# Patient Record
Sex: Female | Born: 1945 | Race: White | Hispanic: No | Marital: Single | Smoking: Never smoker
Health system: Southern US, Community
[De-identification: ages and names within clinical notes are randomized; demographics above are authoritative.]

## PROBLEM LIST (undated history)

## (undated) DIAGNOSIS — D496 Neoplasm of unspecified behavior of brain: Secondary | ICD-10-CM

## (undated) DIAGNOSIS — C719 Malignant neoplasm of brain, unspecified: Secondary | ICD-10-CM

## (undated) DIAGNOSIS — F419 Anxiety disorder, unspecified: Secondary | ICD-10-CM

## (undated) DIAGNOSIS — R011 Cardiac murmur, unspecified: Secondary | ICD-10-CM

## (undated) DIAGNOSIS — N200 Calculus of kidney: Secondary | ICD-10-CM

## (undated) DIAGNOSIS — R569 Unspecified convulsions: Secondary | ICD-10-CM

## (undated) DIAGNOSIS — R55 Syncope and collapse: Secondary | ICD-10-CM

## (undated) DIAGNOSIS — C50919 Malignant neoplasm of unspecified site of unspecified female breast: Secondary | ICD-10-CM

## (undated) DIAGNOSIS — M25562 Pain in left knee: Secondary | ICD-10-CM

## (undated) DIAGNOSIS — C50412 Malignant neoplasm of upper-outer quadrant of left female breast: Secondary | ICD-10-CM

## (undated) DIAGNOSIS — T451X5A Adverse effect of antineoplastic and immunosuppressive drugs, initial encounter: Secondary | ICD-10-CM

## (undated) DIAGNOSIS — E669 Obesity, unspecified: Secondary | ICD-10-CM

## (undated) DIAGNOSIS — D649 Anemia, unspecified: Secondary | ICD-10-CM

## (undated) DIAGNOSIS — E538 Deficiency of other specified B group vitamins: Secondary | ICD-10-CM

## (undated) DIAGNOSIS — K521 Toxic gastroenteritis and colitis: Secondary | ICD-10-CM

## (undated) DIAGNOSIS — Z9289 Personal history of other medical treatment: Secondary | ICD-10-CM

## (undated) DIAGNOSIS — K219 Gastro-esophageal reflux disease without esophagitis: Secondary | ICD-10-CM

## (undated) DIAGNOSIS — C50912 Malignant neoplasm of unspecified site of left female breast: Secondary | ICD-10-CM

## (undated) DIAGNOSIS — C7931 Secondary malignant neoplasm of brain: Secondary | ICD-10-CM

## (undated) DIAGNOSIS — G40909 Epilepsy, unspecified, not intractable, without status epilepticus: Secondary | ICD-10-CM

## (undated) DIAGNOSIS — D6481 Anemia due to antineoplastic chemotherapy: Secondary | ICD-10-CM

## (undated) DIAGNOSIS — M6282 Rhabdomyolysis: Secondary | ICD-10-CM

## (undated) DIAGNOSIS — E559 Vitamin D deficiency, unspecified: Secondary | ICD-10-CM

## (undated) DIAGNOSIS — N179 Acute kidney failure, unspecified: Secondary | ICD-10-CM

## (undated) DIAGNOSIS — N189 Chronic kidney disease, unspecified: Secondary | ICD-10-CM

## (undated) DIAGNOSIS — K1231 Oral mucositis (ulcerative) due to antineoplastic therapy: Secondary | ICD-10-CM

## (undated) DIAGNOSIS — M5136 Other intervertebral disc degeneration, lumbar region: Secondary | ICD-10-CM

## (undated) DIAGNOSIS — T7840XA Allergy, unspecified, initial encounter: Secondary | ICD-10-CM

## (undated) DIAGNOSIS — M51369 Other intervertebral disc degeneration, lumbar region without mention of lumbar back pain or lower extremity pain: Secondary | ICD-10-CM

## (undated) DIAGNOSIS — I10 Essential (primary) hypertension: Secondary | ICD-10-CM

## (undated) HISTORY — DX: Neoplasm of unspecified behavior of brain: D49.6

## (undated) HISTORY — DX: Secondary malignant neoplasm of brain: C79.31

## (undated) HISTORY — DX: Syncope and collapse: R55

## (undated) HISTORY — DX: Anemia due to antineoplastic chemotherapy: D64.81

## (undated) HISTORY — DX: Gastro-esophageal reflux disease without esophagitis: K21.9

## (undated) HISTORY — DX: Malignant neoplasm of unspecified site of unspecified female breast: C50.919

## (undated) HISTORY — DX: Malignant neoplasm of brain, unspecified: C71.9

## (undated) HISTORY — DX: Allergy, unspecified, initial encounter: T78.40XA

## (undated) HISTORY — DX: Adverse effect of antineoplastic and immunosuppressive drugs, initial encounter: T45.1X5A

## (undated) HISTORY — DX: Unspecified convulsions: R56.9

## (undated) HISTORY — DX: Deficiency of other specified B group vitamins: E53.8

## (undated) HISTORY — PX: ESOPHAGEAL DILATION: SHX303

## (undated) HISTORY — DX: Oral mucositis (ulcerative) due to antineoplastic therapy: K12.31

## (undated) HISTORY — PX: CHOLECYSTECTOMY OPEN: SUR202

## (undated) HISTORY — DX: Hypomagnesemia: E83.42

## (undated) HISTORY — DX: Vitamin D deficiency, unspecified: E55.9

## (undated) HISTORY — DX: Malignant neoplasm of upper-outer quadrant of left female breast: C50.412

## (undated) HISTORY — DX: Acute kidney failure, unspecified: N17.9

## (undated) HISTORY — DX: Obesity, unspecified: E66.9

## (undated) HISTORY — DX: Chronic kidney disease, unspecified: N18.9

## (undated) HISTORY — DX: Essential (primary) hypertension: I10

## (undated) HISTORY — DX: Anxiety disorder, unspecified: F41.9

## (undated) HISTORY — DX: Toxic gastroenteritis and colitis: K52.1

## (undated) HISTORY — DX: Epilepsy, unspecified, not intractable, without status epilepticus: G40.909

## (undated) HISTORY — DX: Malignant neoplasm of unspecified site of left female breast: C50.912

## (undated) HISTORY — DX: Rhabdomyolysis: M62.82

## (undated) HISTORY — DX: Pain in left knee: M25.562

## (undated) HISTORY — PX: BREAST BIOPSY: SHX20

---

## 1998-09-02 ENCOUNTER — Other Ambulatory Visit: Admission: RE | Admit: 1998-09-02 | Discharge: 1998-09-02 | Payer: Self-pay | Admitting: Gynecology

## 1998-10-01 ENCOUNTER — Other Ambulatory Visit: Admission: RE | Admit: 1998-10-01 | Discharge: 1998-10-01 | Payer: Self-pay

## 2001-09-12 ENCOUNTER — Other Ambulatory Visit: Admission: RE | Admit: 2001-09-12 | Discharge: 2001-09-12 | Payer: Self-pay | Admitting: Obstetrics and Gynecology

## 2001-12-26 ENCOUNTER — Emergency Department (HOSPITAL_COMMUNITY): Admission: EM | Admit: 2001-12-26 | Discharge: 2001-12-26 | Payer: Self-pay | Admitting: Emergency Medicine

## 2001-12-30 ENCOUNTER — Encounter: Payer: Self-pay | Admitting: Internal Medicine

## 2001-12-30 ENCOUNTER — Encounter: Admission: RE | Admit: 2001-12-30 | Discharge: 2001-12-30 | Payer: Self-pay | Admitting: Internal Medicine

## 2002-09-20 ENCOUNTER — Encounter: Admission: RE | Admit: 2002-09-20 | Discharge: 2002-12-19 | Payer: Self-pay | Admitting: Internal Medicine

## 2003-01-09 ENCOUNTER — Encounter: Admission: RE | Admit: 2003-01-09 | Discharge: 2003-04-09 | Payer: Self-pay | Admitting: General Practice

## 2003-12-26 ENCOUNTER — Ambulatory Visit (HOSPITAL_COMMUNITY): Admission: RE | Admit: 2003-12-26 | Discharge: 2003-12-26 | Payer: Self-pay | Admitting: Gastroenterology

## 2004-08-18 ENCOUNTER — Ambulatory Visit: Payer: Self-pay | Admitting: Internal Medicine

## 2005-03-23 ENCOUNTER — Ambulatory Visit: Payer: Self-pay | Admitting: Internal Medicine

## 2006-07-15 ENCOUNTER — Ambulatory Visit: Payer: Self-pay | Admitting: Internal Medicine

## 2006-07-15 LAB — CONVERTED CEMR LAB
ALT: 21 units/L (ref 0–40)
AST: 25 units/L (ref 0–37)
Albumin: 3.7 g/dL (ref 3.5–5.2)
Alkaline Phosphatase: 83 units/L (ref 39–117)
BUN: 14 mg/dL (ref 6–23)
Basophils Absolute: 0.8 10*3/uL — ABNORMAL HIGH (ref 0.0–0.1)
Basophils Relative: 10.8 % — ABNORMAL HIGH (ref 0.0–1.0)
Bilirubin Urine: NEGATIVE
CO2: 26 meq/L (ref 19–32)
Calcium: 9.2 mg/dL (ref 8.4–10.5)
Chloride: 105 meq/L (ref 96–112)
Chol/HDL Ratio, serum: 4.2
Cholesterol: 149 mg/dL (ref 0–200)
Creatinine, Ser: 1 mg/dL (ref 0.4–1.2)
Crystals: NEGATIVE
Eosinophil percent: 1.3 % (ref 0.0–5.0)
GFR calc non Af Amer: 60 mL/min
Glomerular Filtration Rate, Af Am: 72 mL/min/{1.73_m2}
Glucose, Bld: 112 mg/dL — ABNORMAL HIGH (ref 70–99)
HCT: 40.5 % (ref 36.0–46.0)
HDL: 35.4 mg/dL — ABNORMAL LOW (ref >39.0)
Hemoglobin, Urine: NEGATIVE
Hemoglobin: 13.9 g/dL (ref 12.0–15.0)
Ketones, ur: NEGATIVE mg/dL
LDL DIRECT: 80.9 mg/dL
Lymphocytes Relative: 45.4 % (ref 12.0–46.0)
MCHC: 34.2 g/dL (ref 30.0–36.0)
MCV: 91.6 fL (ref 78.0–100.0)
Monocytes Absolute: 0.4 10*3/uL (ref 0.2–0.7)
Monocytes Relative: 5 % (ref 3.0–11.0)
Neutro Abs: 2.9 10*3/uL (ref 1.4–7.7)
Neutrophils Relative %: 37.5 % — ABNORMAL LOW (ref 43.0–77.0)
Nitrite: POSITIVE — AB
Platelets: 261 10*3/uL (ref 150–400)
Potassium: 3.3 meq/L — ABNORMAL LOW (ref 3.5–5.1)
RBC: 4.42 M/uL (ref 3.87–5.11)
RDW: 12.2 % (ref 11.5–14.6)
Sodium: 142 meq/L (ref 135–145)
Specific Gravity, Urine: 1.025 (ref 1.000–1.03)
TSH: 1.41 microintl units/mL (ref 0.35–5.50)
Total Bilirubin: 0.7 mg/dL (ref 0.3–1.2)
Total Protein, Urine: NEGATIVE mg/dL
Total Protein: 7.1 g/dL (ref 6.0–8.3)
Triglyceride fasting, serum: 212 mg/dL (ref 0–149)
Urine Glucose: NEGATIVE mg/dL
Urobilinogen, UA: 0.2 (ref 0.0–1.0)
VLDL: 42 mg/dL — ABNORMAL HIGH (ref 0–40)
WBC: 7.7 10*3/uL (ref 4.5–10.5)
pH: 6 (ref 5.0–8.0)

## 2006-07-22 ENCOUNTER — Ambulatory Visit: Payer: Self-pay | Admitting: Internal Medicine

## 2007-05-09 ENCOUNTER — Encounter: Payer: Self-pay | Admitting: Internal Medicine

## 2007-05-09 DIAGNOSIS — K219 Gastro-esophageal reflux disease without esophagitis: Secondary | ICD-10-CM

## 2007-05-09 DIAGNOSIS — J309 Allergic rhinitis, unspecified: Secondary | ICD-10-CM | POA: Insufficient documentation

## 2007-05-09 HISTORY — DX: Gastro-esophageal reflux disease without esophagitis: K21.9

## 2008-03-20 ENCOUNTER — Telehealth: Payer: Self-pay | Admitting: Internal Medicine

## 2008-04-20 ENCOUNTER — Ambulatory Visit: Payer: Self-pay | Admitting: Internal Medicine

## 2008-04-20 LAB — CONVERTED CEMR LAB
ALT: 30 units/L (ref 0–35)
AST: 27 units/L (ref 0–37)
Albumin: 4 g/dL (ref 3.5–5.2)
Alkaline Phosphatase: 72 units/L (ref 39–117)
BUN: 12 mg/dL (ref 6–23)
Basophils Absolute: 0 10*3/uL (ref 0.0–0.1)
Basophils Relative: 0.8 % (ref 0.0–3.0)
Bilirubin Urine: NEGATIVE
Bilirubin, Direct: 0.1 mg/dL (ref 0.0–0.3)
CO2: 28 meq/L (ref 19–32)
Calcium: 9.1 mg/dL (ref 8.4–10.5)
Chloride: 101 meq/L (ref 96–112)
Cholesterol: 154 mg/dL (ref 0–200)
Creatinine, Ser: 0.7 mg/dL (ref 0.4–1.2)
Direct LDL: 68.2 mg/dL
Eosinophils Absolute: 0.1 10*3/uL (ref 0.0–0.7)
Eosinophils Relative: 1.5 % (ref 0.0–5.0)
GFR calc Af Amer: 109 mL/min
GFR calc non Af Amer: 90 mL/min
Glucose, Bld: 125 mg/dL — ABNORMAL HIGH (ref 70–99)
HCT: 39 % (ref 36.0–46.0)
HDL: 35.6 mg/dL — ABNORMAL LOW (ref >39.0)
Hemoglobin, Urine: NEGATIVE
Hemoglobin: 13.5 g/dL (ref 12.0–15.0)
Ketones, ur: NEGATIVE mg/dL
Leukocytes, UA: NEGATIVE
Lymphocytes Relative: 36.2 % (ref 12.0–46.0)
MCHC: 34.7 g/dL (ref 30.0–36.0)
MCV: 92.2 fL (ref 78.0–100.0)
Monocytes Absolute: 0.4 10*3/uL (ref 0.1–1.0)
Monocytes Relative: 7.3 % (ref 3.0–12.0)
Neutro Abs: 3.4 10*3/uL (ref 1.4–7.7)
Neutrophils Relative %: 54.2 % (ref 43.0–77.0)
Nitrite: NEGATIVE
Platelets: 272 10*3/uL (ref 150–400)
Potassium: 3.2 meq/L — ABNORMAL LOW (ref 3.5–5.1)
RBC: 4.23 M/uL (ref 3.87–5.11)
RDW: 12.5 % (ref 11.5–14.6)
Sodium: 140 meq/L (ref 135–145)
Specific Gravity, Urine: 1.02 (ref 1.000–1.03)
TSH: 0.85 microintl units/mL (ref 0.35–5.50)
Total Bilirubin: 0.7 mg/dL (ref 0.3–1.2)
Total CHOL/HDL Ratio: 4.3
Total Protein, Urine: NEGATIVE mg/dL
Total Protein: 7.3 g/dL (ref 6.0–8.3)
Triglycerides: 230 mg/dL (ref 0–149)
Urine Glucose: NEGATIVE mg/dL
Urobilinogen, UA: 0.2 (ref 0.0–1.0)
VLDL: 46 mg/dL — ABNORMAL HIGH (ref 0–40)
WBC: 6.1 10*3/uL (ref 4.5–10.5)
pH: 5.5 (ref 5.0–8.0)

## 2008-04-23 ENCOUNTER — Ambulatory Visit: Payer: Self-pay | Admitting: Internal Medicine

## 2008-10-22 ENCOUNTER — Ambulatory Visit: Payer: Self-pay | Admitting: Internal Medicine

## 2008-10-22 DIAGNOSIS — E538 Deficiency of other specified B group vitamins: Secondary | ICD-10-CM | POA: Insufficient documentation

## 2008-10-22 HISTORY — DX: Deficiency of other specified B group vitamins: E53.8

## 2008-10-24 LAB — CONVERTED CEMR LAB
BUN: 12 mg/dL (ref 6–23)
Basophils Absolute: 0.1 10*3/uL (ref 0.0–0.1)
Basophils Relative: 1.2 % (ref 0.0–3.0)
Bilirubin Urine: NEGATIVE
CO2: 29 meq/L (ref 19–32)
Calcium: 9.1 mg/dL (ref 8.4–10.5)
Chloride: 105 meq/L (ref 96–112)
Creatinine, Ser: 0.8 mg/dL (ref 0.4–1.2)
Eosinophils Absolute: 0.1 10*3/uL (ref 0.0–0.7)
Eosinophils Relative: 1 % (ref 0.0–5.0)
GFR calc Af Amer: 93 mL/min
GFR calc non Af Amer: 77 mL/min
Glucose, Bld: 125 mg/dL — ABNORMAL HIGH (ref 70–99)
HCT: 39 % (ref 36.0–46.0)
Hemoglobin, Urine: NEGATIVE
Hemoglobin: 13.7 g/dL (ref 12.0–15.0)
Ketones, ur: NEGATIVE mg/dL
Leukocytes, UA: NEGATIVE
Lymphocytes Relative: 28 % (ref 12.0–46.0)
MCHC: 35 g/dL (ref 30.0–36.0)
MCV: 90.5 fL (ref 78.0–100.0)
Monocytes Absolute: 0.3 10*3/uL (ref 0.1–1.0)
Monocytes Relative: 5.1 % (ref 3.0–12.0)
Neutro Abs: 3.8 10*3/uL (ref 1.4–7.7)
Neutrophils Relative %: 64.7 % (ref 43.0–77.0)
Nitrite: NEGATIVE
Platelets: 251 10*3/uL (ref 150–400)
Potassium: 3.6 meq/L (ref 3.5–5.1)
RBC: 4.31 M/uL (ref 3.87–5.11)
RDW: 12.5 % (ref 11.5–14.6)
Sodium: 146 meq/L — ABNORMAL HIGH (ref 135–145)
Specific Gravity, Urine: 1.02 (ref 1.000–1.035)
TSH: 0.98 microintl units/mL (ref 0.35–5.50)
Total Protein, Urine: NEGATIVE mg/dL
Urine Glucose: NEGATIVE mg/dL
Urobilinogen, UA: 0.2 (ref 0.0–1.0)
Vitamin B-12: 536 pg/mL (ref 211–911)
WBC: 6 10*3/uL (ref 4.5–10.5)
pH: 6 (ref 5.0–8.0)

## 2008-10-30 LAB — CONVERTED CEMR LAB: Vit D, 25-Hydroxy: 20 ng/mL — ABNORMAL LOW (ref 30–89)

## 2009-02-08 ENCOUNTER — Telehealth: Payer: Self-pay | Admitting: Internal Medicine

## 2009-04-24 ENCOUNTER — Ambulatory Visit: Payer: Self-pay | Admitting: Internal Medicine

## 2009-04-24 DIAGNOSIS — R7309 Other abnormal glucose: Secondary | ICD-10-CM | POA: Insufficient documentation

## 2009-04-24 DIAGNOSIS — E559 Vitamin D deficiency, unspecified: Secondary | ICD-10-CM

## 2009-04-24 HISTORY — DX: Vitamin D deficiency, unspecified: E55.9

## 2009-04-29 LAB — CONVERTED CEMR LAB
ALT: 33 units/L (ref 0–35)
AST: 30 units/L (ref 0–37)
Albumin: 4 g/dL (ref 3.5–5.2)
Alkaline Phosphatase: 89 units/L (ref 39–117)
BUN: 15 mg/dL (ref 6–23)
Bilirubin Urine: NEGATIVE
Bilirubin, Direct: 0.1 mg/dL (ref 0.0–0.3)
CO2: 27 meq/L (ref 19–32)
Calcium: 9.4 mg/dL (ref 8.4–10.5)
Chloride: 104 meq/L (ref 96–112)
Cholesterol: 145 mg/dL (ref 0–200)
Creatinine, Ser: 0.8 mg/dL (ref 0.4–1.2)
GFR calc non Af Amer: 76.7 mL/min (ref >60)
Glucose, Bld: 124 mg/dL — ABNORMAL HIGH (ref 70–99)
HDL: 38.1 mg/dL — ABNORMAL LOW (ref >39.00)
Hemoglobin, Urine: NEGATIVE
Hgb A1c MFr Bld: 6.2 % (ref 4.6–6.5)
Ketones, ur: NEGATIVE mg/dL
LDL Cholesterol: 77 mg/dL (ref 0–99)
Leukocytes, UA: NEGATIVE
Nitrite: NEGATIVE
Potassium: 3.7 meq/L (ref 3.5–5.1)
Sodium: 141 meq/L (ref 135–145)
Specific Gravity, Urine: 1.025 (ref 1.000–1.030)
TSH: 1.03 microintl units/mL (ref 0.35–5.50)
Total Bilirubin: 0.8 mg/dL (ref 0.3–1.2)
Total CHOL/HDL Ratio: 4
Total Protein, Urine: NEGATIVE mg/dL
Total Protein: 7.9 g/dL (ref 6.0–8.3)
Triglycerides: 149 mg/dL (ref 0.0–149.0)
Urine Glucose: NEGATIVE mg/dL
Urobilinogen, UA: 0.2 (ref 0.0–1.0)
VLDL: 29.8 mg/dL (ref 0.0–40.0)
pH: 5.5 (ref 5.0–8.0)

## 2009-09-04 ENCOUNTER — Telehealth: Payer: Self-pay | Admitting: Internal Medicine

## 2009-10-22 ENCOUNTER — Ambulatory Visit: Payer: Self-pay | Admitting: Internal Medicine

## 2009-10-22 LAB — CONVERTED CEMR LAB
BUN: 19 mg/dL (ref 6–23)
Basophils Absolute: 0 10*3/uL (ref 0.0–0.1)
Basophils Relative: 0 % (ref 0.0–3.0)
CO2: 27 meq/L (ref 19–32)
Calcium: 9.2 mg/dL (ref 8.4–10.5)
Chloride: 104 meq/L (ref 96–112)
Creatinine, Ser: 0.7 mg/dL (ref 0.4–1.2)
Eosinophils Absolute: 0.1 10*3/uL (ref 0.0–0.7)
Eosinophils Relative: 1.6 % (ref 0.0–5.0)
GFR calc non Af Amer: 89.34 mL/min (ref >60)
Glucose, Bld: 121 mg/dL — ABNORMAL HIGH (ref 70–99)
HCT: 40 % (ref 36.0–46.0)
Hemoglobin: 13.3 g/dL (ref 12.0–15.0)
Hgb A1c MFr Bld: 6.2 % (ref 4.6–6.5)
Lymphocytes Relative: 43.5 % (ref 12.0–46.0)
Lymphs Abs: 3.4 10*3/uL (ref 0.7–4.0)
MCHC: 33.3 g/dL (ref 30.0–36.0)
MCV: 93.5 fL (ref 78.0–100.0)
Monocytes Absolute: 0.6 10*3/uL (ref 0.1–1.0)
Monocytes Relative: 8.3 % (ref 3.0–12.0)
Neutro Abs: 3.7 10*3/uL (ref 1.4–7.7)
Neutrophils Relative %: 46.6 % (ref 43.0–77.0)
Platelets: 213 10*3/uL (ref 150.0–400.0)
Potassium: 3.5 meq/L (ref 3.5–5.1)
RBC: 4.28 M/uL (ref 3.87–5.11)
RDW: 13.1 % (ref 11.5–14.6)
Sodium: 144 meq/L (ref 135–145)
WBC: 7.8 10*3/uL (ref 4.5–10.5)

## 2009-10-28 ENCOUNTER — Ambulatory Visit: Payer: Self-pay | Admitting: Internal Medicine

## 2009-10-28 DIAGNOSIS — F419 Anxiety disorder, unspecified: Secondary | ICD-10-CM

## 2010-04-09 ENCOUNTER — Telehealth: Payer: Self-pay | Admitting: Internal Medicine

## 2010-04-11 ENCOUNTER — Telehealth: Payer: Self-pay | Admitting: Internal Medicine

## 2010-04-30 ENCOUNTER — Ambulatory Visit: Payer: Self-pay | Admitting: Internal Medicine

## 2010-04-30 ENCOUNTER — Encounter: Payer: Self-pay | Admitting: Internal Medicine

## 2010-04-30 DIAGNOSIS — J069 Acute upper respiratory infection, unspecified: Secondary | ICD-10-CM | POA: Insufficient documentation

## 2010-05-01 LAB — CONVERTED CEMR LAB
ALT: 32 units/L (ref 0–35)
AST: 31 units/L (ref 0–37)
Albumin: 4 g/dL (ref 3.5–5.2)
Alkaline Phosphatase: 79 units/L (ref 39–117)
BUN: 13 mg/dL (ref 6–23)
Basophils Absolute: 0 10*3/uL (ref 0.0–0.1)
Basophils Relative: 0.6 % (ref 0.0–3.0)
Bilirubin Urine: NEGATIVE
Bilirubin, Direct: 0.1 mg/dL (ref 0.0–0.3)
CO2: 24 meq/L (ref 19–32)
Calcium: 9.5 mg/dL (ref 8.4–10.5)
Chloride: 105 meq/L (ref 96–112)
Cholesterol: 164 mg/dL (ref 0–200)
Creatinine, Ser: 0.8 mg/dL (ref 0.4–1.2)
Direct LDL: 94 mg/dL
Eosinophils Absolute: 0.1 10*3/uL (ref 0.0–0.7)
Eosinophils Relative: 1.1 % (ref 0.0–5.0)
GFR calc non Af Amer: 77.57 mL/min (ref >60)
Glucose, Bld: 121 mg/dL — ABNORMAL HIGH (ref 70–99)
HCT: 41.5 % (ref 36.0–46.0)
HDL: 39.6 mg/dL (ref >39.00)
Hemoglobin, Urine: NEGATIVE
Hemoglobin: 14.3 g/dL (ref 12.0–15.0)
Hgb A1c MFr Bld: 6.5 % (ref 4.6–6.5)
Ketones, ur: NEGATIVE mg/dL
Leukocytes, UA: NEGATIVE
Lymphocytes Relative: 30.5 % (ref 12.0–46.0)
Lymphs Abs: 2.1 10*3/uL (ref 0.7–4.0)
MCHC: 34.3 g/dL (ref 30.0–36.0)
MCV: 91.1 fL (ref 78.0–100.0)
Monocytes Absolute: 0.4 10*3/uL (ref 0.1–1.0)
Monocytes Relative: 5.9 % (ref 3.0–12.0)
Neutro Abs: 4.3 10*3/uL (ref 1.4–7.7)
Neutrophils Relative %: 61.9 % (ref 43.0–77.0)
Nitrite: NEGATIVE
Platelets: 223 10*3/uL (ref 150.0–400.0)
Potassium: 4.2 meq/L (ref 3.5–5.1)
RBC: 4.56 M/uL (ref 3.87–5.11)
RDW: 14.1 % (ref 11.5–14.6)
Sodium: 139 meq/L (ref 135–145)
Specific Gravity, Urine: 1.025 (ref 1.000–1.030)
TSH: 1.57 microintl units/mL (ref 0.35–5.50)
Total Bilirubin: 0.5 mg/dL (ref 0.3–1.2)
Total CHOL/HDL Ratio: 4
Total Protein, Urine: NEGATIVE mg/dL
Total Protein: 7.1 g/dL (ref 6.0–8.3)
Triglycerides: 215 mg/dL — ABNORMAL HIGH (ref 0.0–149.0)
Urine Glucose: NEGATIVE mg/dL
Urobilinogen, UA: 0.2 (ref 0.0–1.0)
VLDL: 43 mg/dL — ABNORMAL HIGH (ref 0.0–40.0)
Vit D, 25-Hydroxy: 14 ng/mL — ABNORMAL LOW (ref 30–89)
Vitamin B-12: 493 pg/mL (ref 211–911)
WBC: 6.9 10*3/uL (ref 4.5–10.5)
pH: 6 (ref 5.0–8.0)

## 2010-07-31 ENCOUNTER — Ambulatory Visit: Payer: Self-pay | Admitting: Internal Medicine

## 2010-08-12 ENCOUNTER — Encounter: Payer: Self-pay | Admitting: Internal Medicine

## 2010-08-14 LAB — CONVERTED CEMR LAB
CO2: 23 meq/L (ref 19–32)
Calcium: 9.4 mg/dL (ref 8.4–10.5)
GFR calc non Af Amer: 89.11 mL/min (ref >60.00)
Hgb A1c MFr Bld: 6.2 % (ref 4.6–6.5)
Sodium: 142 meq/L (ref 135–145)
Vitamin B-12: 569 pg/mL (ref 211–911)

## 2010-08-15 LAB — CONVERTED CEMR LAB: Vit D, 25-Hydroxy: 25 ng/mL — ABNORMAL LOW (ref 30–89)

## 2010-09-11 NOTE — Assessment & Plan Note (Signed)
Summary: 3 MO ROV /NWS  #   Vital Signs:  Patient profile:   65 year old female Height:      63 inches Weight:      242 pounds BMI:     43.02 Temp:     99.1 degrees F oral Pulse rate:   76 / minute Pulse rhythm:   regular Resp:     16 per minute BP sitting:   152 / 100  (left arm) Cuff size:   large  Vitals Entered By: Lanier Prude, CMA(AAMA) (August 12, 2010 8:08 AM) CC: 3 mo f/u  Is Patient Diabetic? No   CC:  3 mo f/u .  History of Present Illness: The patient presents for a follow up of hypertension, diabetes, hyperlipidemia   Current Medications (verified): 1)  Prilosec 20 Mg  Cpdr (Omeprazole) .... Once Daily 2)  Toprol Xl 50 Mg  Tb24 (Metoprolol Succinate) .... Once Daily 3)  Lipitor 10 Mg  Tabs (Atorvastatin Calcium) .... Once Daily 4)  Lorazepam 0.5 Mg  Tabs (Lorazepam) .Marland Kitchen.. 1 Three Times A Day As Needed 5)  Hytrin 5 Mg  Caps (Terazosin Hcl) .Marland Kitchen.. 1 At Bedtime 6)  Folic Acid 1 Mg  Tabs (Folic Acid) .... Once Daily 7)  Potassium Chloride Cr 10 Meq  Tbcr (Potassium Chloride) .Marland Kitchen.. 1 By Mouth Qd 8)  Vitamin D3 1000 Unit  Tabs (Cholecalciferol) .Marland Kitchen.. 1 By Mouth Daily 9)  Cyanocobalamin 1000 Mcg/ml Soln (Cyanocobalamin) .Marland Kitchen.. 1 Ml Once Monthly Subq or Im 10)  Hyzaar 100-25 Mg Tabs (Losartan Potassium-Hctz) .Marland Kitchen.. 1 By Mouth Qd 11)  Vitamin D (Ergocalciferol) 50000 Unit Caps (Ergocalciferol) .Marland Kitchen.. 1 By Mouth Q 1 Week X 6 Weeks Then Start Vit D 1000 International Units Qd  Allergies (verified): 1)  ! Sulfa 2)  Pcn  Past History:  Past Medical History: Last updated: 10/28/2009 Allergic rhinitis GERD 538.1 Hypertension 401.1 obesity 278.01 vit B12 deficiency Vit D def 2010 DR Katrinka Blazing GYN Anxiety  Social History: Last updated: 04/30/2010 Occupation: car dealership Married Never Smoked Alcohol use-no Regular exercise-no  Review of Systems  The patient denies fever, syncope, dyspnea on exertion, abdominal pain, and severe indigestion/heartburn.    Physical  Exam  General:  overweight-appearing.   Nose:  External nasal examination shows no deformity or inflammation. Nasal mucosa are pink and moist without lesions or exudates. Mouth:  Oral mucosa and oropharynx without lesions or exudates.  Teeth in good repair. Lungs:  Normal respiratory effort, chest expands symmetrically. Lungs are clear to auscultation, no crackles or wheezes. Heart:  Normal rate and regular rhythm. S1 and S2 normal without gallop, murmur, click, rub or other extra sounds. Abdomen:  Bowel sounds positive,abdomen soft and non-tender without masses, organomegaly or hernias noted. Msk:  No deformity or scoliosis noted of thoracic or lumbar spine.   Pulses:  R and L carotid,radial,femoral,dorsalis pedis and posterior tibial pulses are full and equal bilaterally Neurologic:  No cranial nerve deficits noted. Station and gait are normal. Plantar reflexes are down-going bilaterally. DTRs are symmetrical throughout. Sensory, motor and coordinative functions appear intact. Skin:  Intact without suspicious lesions or rashes Cervical Nodes:  No lymphadenopathy noted Psych:  Cognition and judgment appear intact. Alert and cooperative with normal attention span and concentration. No apparent delusions, illusions, hallucinations   Impression & Recommendations:  Problem # 1:  HYPERGLYCEMIA (ICD-790.29) Assessment Comment Only  Orders: TLB-BMP (Basic Metabolic Panel-BMET) (80048-METABOL) TLB-B12, Serum-Total ONLY (04540-J81) T-Vitamin D (25-Hydroxy) (19147-82956) TLB-A1C / Hgb A1C (Glycohemoglobin) (83036-A1C)  Problem # 2:  HYPERTENSION (ICD-401.9) Assessment: Deteriorated  She did not take her meds today Risks of noncompliance with treatment discussed. Compliance encouraged.  Her updated medication list for this problem includes:    Toprol Xl 50 Mg Tb24 (Metoprolol succinate) ..... Once daily    Hytrin 5 Mg Caps (Terazosin hcl) .Marland Kitchen... 1 at bedtime    Hyzaar 100-25 Mg Tabs (Losartan  potassium-hctz) .Marland Kitchen... 1 by mouth qd  Orders: TLB-BMP (Basic Metabolic Panel-BMET) (80048-METABOL) TLB-B12, Serum-Total ONLY (16109-U04) T-Vitamin D (25-Hydroxy) (54098-11914) TLB-A1C / Hgb A1C (Glycohemoglobin) (83036-A1C)  Problem # 3:  VITAMIN B12 DEFICIENCY (ICD-266.2) Assessment: Unchanged  On the regimen of medicine(s) reflected in the chart    Orders: TLB-BMP (Basic Metabolic Panel-BMET) (80048-METABOL) TLB-B12, Serum-Total ONLY (78295-A21) T-Vitamin D (25-Hydroxy) (30865-78469) TLB-A1C / Hgb A1C (Glycohemoglobin) (83036-A1C)  Problem # 4:  VITAMIN D DEFICIENCY (ICD-268.9) Assessment: Unchanged  On the regimen of medicine(s) reflected in the chart    Orders: TLB-BMP (Basic Metabolic Panel-BMET) (80048-METABOL) TLB-B12, Serum-Total ONLY (62952-W41) T-Vitamin D (25-Hydroxy) (32440-10272) TLB-A1C / Hgb A1C (Glycohemoglobin) (83036-A1C)  Complete Medication List: 1)  Prilosec 20 Mg Cpdr (Omeprazole) .... Once daily 2)  Toprol Xl 50 Mg Tb24 (Metoprolol succinate) .... Once daily 3)  Lipitor 10 Mg Tabs (Atorvastatin calcium) .... Once daily 4)  Lorazepam 0.5 Mg Tabs (Lorazepam) .Marland Kitchen.. 1 three times a day as needed 5)  Hytrin 5 Mg Caps (Terazosin hcl) .Marland Kitchen.. 1 at bedtime 6)  Folic Acid 1 Mg Tabs (Folic acid) .... Once daily 7)  Potassium Chloride Cr 10 Meq Tbcr (Potassium chloride) .Marland Kitchen.. 1 by mouth qd 8)  Vitamin D3 1000 Unit Tabs (Cholecalciferol) .Marland Kitchen.. 1 by mouth daily 9)  Cyanocobalamin 1000 Mcg/ml Soln (Cyanocobalamin) .Marland Kitchen.. 1 ml once monthly subq or im 10)  Hyzaar 100-25 Mg Tabs (Losartan potassium-hctz) .Marland Kitchen.. 1 by mouth qd 11)  Vitamin D (ergocalciferol) 50000 Unit Caps (Ergocalciferol) .Marland Kitchen.. 1 by mouth q 1 week x 6 weeks then start vit d 1000 international units qd  Patient Instructions: 1)  Please schedule a follow-up appointment in 4 months.   Orders Added: 1)  TLB-BMP (Basic Metabolic Panel-BMET) [80048-METABOL] 2)  TLB-B12, Serum-Total ONLY [82607-B12] 3)  T-Vitamin  D (25-Hydroxy) [53664-40347] 4)  TLB-A1C / Hgb A1C (Glycohemoglobin) [83036-A1C] 5)  Est. Patient Level IV [42595]

## 2010-09-11 NOTE — Progress Notes (Signed)
Summary: lorazepam  Phone Note Other Incoming Call back at fax   Caller: kmart [phone] Summary of Call: refill on lorazepam------ok x 2 refills  will fax back to pharmacy/vg Initial call taken by: Tora Perches,  September 04, 2009 4:06 PM    Prescriptions: LORAZEPAM 0.5 MG  TABS (LORAZEPAM) 1 three times a day as needed  #90 x 2   Entered by:   Tora Perches   Authorized by:   Tresa Garter MD   Signed by:   Tora Perches on 09/04/2009   Method used:   Telephoned to ...       [facility]  [address] [phone]* (retail)       [address]       [place], [place]  [address]       Ph: [phone]       Fax: [phone]   RxID:   [id number]

## 2010-09-11 NOTE — Progress Notes (Signed)
Summary: Rf Lorazepam  Phone Note Refill Request Message from:  Fax from Pharmacy  Refills Requested: Medication #1:  LORAZEPAM 0.5 MG  TABS 1 three times a day as needed   Dosage confirmed as above?Dosage Confirmed   Supply Requested: 90  Method Requested: Telephone to Pharmacy Initial call taken by: Lanier Prude, [facility]),  April 09, 2010 4:21 PM  Follow-up for Phone Call        ok 3 ref Follow-up by: Tresa Garter MD,  April 09, 2010 4:58 PM  Additional Follow-up for Phone Call Additional follow up Details #1::        Rx called to pharmacy Additional Follow-up by: Lanier Prude, [facility]),  April 10, 2010 8:07 AM    Prescriptions: LORAZEPAM 0.5 MG  TABS (LORAZEPAM) 1 three times a day as needed  #90 x 3   Entered by:   Lanier Prude, [facility])   Authorized by:   Tresa Garter MD   Signed by:   Lanier Prude, CMA(AAMA) on 04/10/2010   Method used:   Telephoned to ...       [facility]  [address] [phone]* (retail)       [address]       [place], [place]  [address]       Ph: [phone]       Fax: [phone]   RxID:   [id number]

## 2010-09-11 NOTE — Assessment & Plan Note (Signed)
Summary: cpx-lb   Vital Signs:  Patient profile:   65 year old female Height:      63 inches Weight:      242 pounds BMI:     43.02 Temp:     100.4 degrees F oral Pulse rate:   72 / minute Pulse rhythm:   regular Resp:     16 per minute BP sitting:   140 / 90  (left arm) Cuff size:   large  Vitals Entered By: Lanier Prude, [facility]) (April 30, 2010 8:33 AM) CC: CPX Is Patient Diabetic? No   CC:  CPX.  History of Present Illness: The patient presents for a preventive health examination  C/o sinus pain and R HA  Preventive Screening-Counseling & Management  Alcohol-Tobacco     Smoking Status: never  Caffeine-Diet-Exercise     Does Patient Exercise: no  Current Medications (verified): 1)  Hyzaar 50-12.5 Mg  Tabs (Losartan Potassium-Hctz) .... Once Daily 2)  Prilosec 20 Mg  Cpdr (Omeprazole) .... Once Daily 3)  Toprol Xl 50 Mg  Tb24 (Metoprolol Succinate) .... Once Daily 4)  Lipitor 10 Mg  Tabs (Atorvastatin Calcium) .... Once Daily 5)  Lorazepam 0.5 Mg  Tabs (Lorazepam) .Marland Kitchen.. 1 Three Times A Day As Needed 6)  Hytrin 5 Mg  Caps (Terazosin Hcl) .Marland Kitchen.. 1 At Bedtime 7)  Folic Acid 1 Mg  Tabs (Folic Acid) .... Once Daily 8)  Vitamin B-12 Cr 1000 Mcg  Tbcr (Cyanocobalamin) .... Monthly 9)  Potassium Chloride Cr 10 Meq  Tbcr (Potassium Chloride) .Marland Kitchen.. 1 By Mouth Qd 10)  Vitamin D3 1000 Unit  Tabs (Cholecalciferol) .Marland Kitchen.. 1 By Mouth Daily 11)  Cyanocobalamin 1000 Mcg/ml Soln (Cyanocobalamin) .Marland Kitchen.. 1 Ml Once Monthly Subq or Im  Allergies (verified): 1)  ! 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    Physical Exam  General:  overweight-appearing.   Eyes:  No corneal or conjunctival inflammation noted. EOMI. Perrla. Funduscopic exam benign, without hemorrhages, exudates or papilledema. Vision grossly normal. Ears:  External ear exam shows no significant lesions or deformities.  Otoscopic examination reveals clear canals, tympanic membranes are intact bilaterally without bulging, retraction, inflammation or discharge. Hearing is grossly normal bilaterally. Mouth:  Oral mucosa and oropharynx without lesions or exudates.  Teeth in good repair. Neck:  No deformities, masses, or tenderness noted. Breasts:  No mass, nodules, thickening, tenderness, bulging, retraction, inflamation, nipple discharge or skin changes noted.   Lungs:  Normal respiratory effort, chest expands symmetrically. Lungs are clear to auscultation, no crackles or wheezes. Heart:  Normal rate and regular rhythm. S1 and S2 normal without gallop, murmur, click, rub or other extra sounds. Abdomen:  Bowel sounds positive,abdomen soft and non-tender without masses, organomegaly or hernias noted. Msk:  No deformity or scoliosis noted  of thoracic or lumbar spine.   Pulses:  R and L carotid,radial,femoral,dorsalis pedis and posterior tibial pulses are full and equal bilaterally Extremities:  No clubbing, cyanosis, edema, or deformity noted with normal full range of motion of all  joints.   Neurologic:  No cranial nerve deficits noted. Station and gait are normal. Plantar reflexes are down-going bilaterally. DTRs are symmetrical throughout. Sensory, motor and coordinative functions appear intact. Skin:  Intact without suspicious lesions or rashes Cervical Nodes:  No lymphadenopathy noted Axillary Nodes:  No palpable lymphadenopathy Inguinal Nodes:  No significant adenopathy Psych:  Cognition and judgment appear intact. Alert and cooperative with normal attention span and concentration. No apparent delusions, illusions, hallucinations   Impression & Recommendations:  Problem # 1:  WELL ADULT EXAM (ICD-V70.0) Assessment New She will sch mammo Orders: EKG w/ Interpretation (93000) TLB-BMP (Basic Metabolic Panel-BMET) (80048-METABOL) TLB-CBC Platelet - w/Differential (85025-CBCD) TLB-Hepatic/Liver Function Pnl (80076-HEPATIC) TLB-Lipid Panel (80061-LIPID) TLB-TSH (Thyroid Stimulating Hormone) (84443-TSH) TLB-Udip ONLY (81003-UDIP) Health and age related issues were discussed. Available screening tests and vaccinations were discussed as well. Healthy life style including good diet and exercise was discussed.   Problem # 2:  UPPER RESPIRATORY INFECTION, ACUTE (ICD-465.9) Assessment: New Use over-the-counter medicines for "cold": Tylenol  650mg  or Advil 400mg  every 6 hours  for fever; Delsym or Robutussin for cough. Mucinex or Mucinex D for congestion. Ricola or Halls for sore throat. Office visit if not better or if worse.   Problem # 3:  VITAMIN D DEFICIENCY (ICD-268.9) Assessment: Deteriorated See meds Orders: T-Vitamin D (25-Hydroxy) (16109-60454)  Problem # 4:  HYPERTENSION (ICD-401.9) Assessment: Deteriorated  The following medications were removed from the medication list:    Hyzaar 50-12.5 Mg Tabs (Losartan potassium-hctz) ..... Once daily Her updated medication list for this problem includes:    Toprol Xl 50 Mg Tb24 (Metoprolol succinate) ..... Once  daily    Hytrin 5 Mg Caps (Terazosin hcl) .Marland Kitchen... 1 at bedtime    Hyzaar 100-25 Mg Tabs (Losartan potassium-hctz) .Marland Kitchen... 1 by mouth qd  BP today: 140/90 Prior BP: 132/82 (10/28/2009)  Labs Reviewed: K+: 3.5 (10/22/2009) Creat: : 0.7 (10/22/2009)   Chol: 145 (04/24/2009)   HDL: 38.10 (04/24/2009)   LDL: 77 (04/24/2009)   TG: 149.0 (04/24/2009)  Complete Medication List: 1)  Prilosec 20 Mg Cpdr (Omeprazole) .... Once daily 2)  Toprol Xl 50 Mg Tb24 (Metoprolol succinate) .... Once daily 3)  Lipitor 10 Mg Tabs (Atorvastatin calcium) .... Once daily 4)  Lorazepam 0.5 Mg Tabs (Lorazepam) .Marland Kitchen.. 1 three times a day as needed 5)  Hytrin 5 Mg Caps (Terazosin hcl) .Marland Kitchen.. 1 at bedtime 6)  Folic Acid 1 Mg Tabs (Folic acid) .... Once daily 7)  Potassium Chloride Cr 10 Meq Tbcr (Potassium chloride) .Marland Kitchen.. 1 by mouth qd 8)  Vitamin D3 1000 Unit Tabs (Cholecalciferol) .Marland Kitchen.. 1 by mouth daily 9)  Cyanocobalamin 1000 Mcg/ml Soln (Cyanocobalamin) .Marland Kitchen.. 1 ml once monthly subq or im 10)  Zithromax Z-pak 250 Mg Tabs (Azithromycin) .... As dirrected 11)  Hyzaar 100-25 Mg Tabs (Losartan potassium-hctz) .Marland Kitchen.. 1 by mouth qd 12)  Vitamin D (ergocalciferol) 50000 Unit Caps (Ergocalciferol) .Marland Kitchen.. 1 by mouth q 1 week x 6 weeks then start vit d 1000 international units qd  Other Orders: TLB-B12, Serum-Total ONLY (82607-B12) TLB-A1C / Hgb A1C (Glycohemoglobin) (83036-A1C)  Patient Instructions: 1)  Please schedule a follow-up appointment in 3 months. Prescriptions: VITAMIN D (ERGOCALCIFEROL) 50000 UNIT CAPS (ERGOCALCIFEROL) 1 by mouth q 1 week x 6 weeks  then start Vit D 1000 international units qd  #6 x 0   Entered and Authorized by:   Tresa Garter MD   Signed by:   Tresa Garter MD on 05/01/2010   Method used:   Electronically to        [facility] [phone]* (retail)       [address]       [place], [place]  [address]       Ph: [phone]       Fax: [phone]   RxID:    [id number] HYZAAR 100-25 MG TABS (LOSARTAN POTASSIUM-HCTZ) 1 by mouth qd  #30 x 12   Entered and Authorized by:   Tresa Garter MD   Signed by:   Tresa Garter MD on 04/30/2010   Method used:   Print then Give to Patient   RxID:   [id number] CYANOCOBALAMIN 1000 MCG/ML SOLN (CYANOCOBALAMIN) 1 mL once monthly subq or IM  #10 ml x 3   Entered and Authorized by:   Tresa Garter MD   Signed by:   Tresa Garter MD on 04/30/2010   Method used:   Print then Give to Patient   RxID:   [id number] ZITHROMAX Z-PAK 250 MG TABS (AZITHROMYCIN) as dirrected  #1 x 0   Entered and Authorized by:   Tresa Garter MD   Signed by:   Tresa Garter MD on 04/30/2010   Method used:   Print then Give to Patient   RxID:   [id number]

## 2010-09-11 NOTE — Assessment & Plan Note (Signed)
Summary: 6 MO ROV /NWS #    Vital Signs:  Patient profile:   65 year old female Height:      63 inches Weight:      239 pounds BMI:     42.49 Temp:     98.8 degrees F oral Pulse rate:   64 / minute BP sitting:   132 / 82  (left arm)  Vitals Entered By: Tora Perches (October 28, 2009 8:29 AM) CC: f/u Is Patient Diabetic? No   CC:  f/u.  History of Present Illness: The patient presents for a follow up of hypertension, elev. glucose, hyperlipidemia   Preventive Screening-Counseling & Management  Alcohol-Tobacco     Smoking Status: never  Current Medications (verified): 1)  Hyzaar 50-12.5 Mg  Tabs (Losartan Potassium-Hctz) .... Once Daily 2)  Prilosec 20 Mg  Cpdr (Omeprazole) .... Once Daily 3)  Toprol Xl 50 Mg  Tb24 (Metoprolol Succinate) .... Once Daily 4)  Lipitor 10 Mg  Tabs (Atorvastatin Calcium) .... Once Daily 5)  Lorazepam 0.5 Mg  Tabs (Lorazepam) .Marland Kitchen.. 1 Three Times A Day As Needed 6)  Hytrin 5 Mg  Caps (Terazosin Hcl) .Marland Kitchen.. 1 At Bedtime 7)  Folic Acid 1 Mg  Tabs (Folic Acid) .... Once Daily 8)  Vitamin B-12 Cr 1000 Mcg  Tbcr (Cyanocobalamin) .... Monthly 9)  Potassium Chloride Cr 10 Meq  Tbcr (Potassium Chloride) .Marland Kitchen.. 1 By Mouth Qd 10)  Vitamin D3 1000 Unit  Tabs (Cholecalciferol) .Marland Kitchen.. 1 By Mouth Daily 11)  Allergy Shots .... Weekly 12)  Cobal-1000 1000 Mcg/ml Soln (Cyanocobalamin) .Marland Kitchen.. 1 Ml Im or Subcutaneously Q 1 Mo 13)  Tamiflu 75 Mg Caps (Oseltamivir Phosphate) .... Take One Capsule By Mouth Twice A Day  Allergies: 1)  ! Sulfa 2)  Pcn  Past History:  Social History: Last updated: 04/23/2008 Occupation: Married Never Smoked  Past Medical History: Allergic rhinitis GERD 538.1 Hypertension 401.1 obesity 278.01 vit B12 deficiency Vit D def 2010 DR Katrinka Blazing GYN Anxiety  Physical Exam  General:  overweight-appearing.   Nose:  External nasal examination shows no deformity or inflammation. Nasal mucosa are pink and moist without lesions or  exudates. Mouth:  Oral mucosa and oropharynx without lesions or exudates.  Teeth in good repair. Neck:  No deformities, masses, or tenderness noted. Lungs:  Normal respiratory effort, chest expands symmetrically. Lungs are clear to auscultation, no crackles or wheezes. Heart:  Normal rate and regular rhythm. S1 and S2 normal without gallop, murmur, click, rub or other extra sounds. Abdomen:  Bowel sounds positive,abdomen soft and non-tender without masses, organomegaly or hernias noted. Msk:  No deformity or scoliosis noted of thoracic or lumbar spine.   Neurologic:  No cranial nerve deficits noted. Station and gait are normal. Plantar reflexes are down-going bilaterally. DTRs are symmetrical throughout. Sensory, motor and coordinative functions appear intact. Skin:  Intact without suspicious lesions or rashes Psych:  Cognition and judgment appear intact. Alert and cooperative with normal attention span and concentration. No apparent delusions, illusions, hallucinations   Impression & Recommendations:  Problem # 1:  HYPERTENSION (ICD-401.9) Assessment Unchanged  Her updated medication list for this problem includes:    Hyzaar 50-12.5 Mg Tabs (Losartan potassium-hctz) ..... Once daily    Toprol Xl 50 Mg Tb24 (Metoprolol succinate) ..... Once daily    Hytrin 5 Mg Caps (Terazosin hcl) .Marland Kitchen... 1 at bedtime  BP today: 132/82 Prior BP: 128/86 (04/24/2009)  Labs Reviewed: K+: 3.5 (10/22/2009) Creat: : 0.7 (10/22/2009)   Chol: 145 (  04/24/2009)   HDL: 38.10 (04/24/2009)   LDL: 77 (04/24/2009)   TG: 149.0 (04/24/2009)  Problem # 2:  GERD (ICD-530.81) Assessment: Improved  Her updated medication list for this problem includes:    Prilosec 20 Mg Cpdr (Omeprazole) ..... Once daily  Problem # 3:  VITAMIN B12 DEFICIENCY (ICD-266.2) Assessment: Unchanged  On prescription therapy   Problem # 4:  HYPERGLYCEMIA (ICD-790.29) Assessment: Improved  Complete Medication List: 1)  Hyzaar 50-12.5 Mg  Tabs (Losartan potassium-hctz) .... Once daily 2)  Prilosec 20 Mg Cpdr (Omeprazole) .... Once daily 3)  Toprol Xl 50 Mg Tb24 (Metoprolol succinate) .... Once daily 4)  Lipitor 10 Mg Tabs (Atorvastatin calcium) .... Once daily 5)  Lorazepam 0.5 Mg Tabs (Lorazepam) .Marland Kitchen.. 1 three times a day as needed 6)  Hytrin 5 Mg Caps (Terazosin hcl) .Marland Kitchen.. 1 at bedtime 7)  Folic Acid 1 Mg Tabs (Folic acid) .... Once daily 8)  Vitamin B-12 Cr 1000 Mcg Tbcr (Cyanocobalamin) .... Monthly 9)  Potassium Chloride Cr 10 Meq Tbcr (Potassium chloride) .Marland Kitchen.. 1 by mouth qd 10)  Vitamin D3 1000 Unit Tabs (Cholecalciferol) .Marland Kitchen.. 1 by mouth daily  Patient Instructions: 1)  Please schedule a follow-up appointment in 6 months well w/labs and A1c, vit B12  266.20 790.29  v70.0. Prescriptions: VITAMIN D3 1000 UNIT  TABS (CHOLECALCIFEROL) 1 by mouth daily  #30 x 12   Entered and Authorized by:   Tresa Garter MD   Signed by:   Tresa Garter MD on 10/28/2009   Method used:   Print then Give to Patient   RxID:   [id number] POTASSIUM CHLORIDE CR 10 MEQ  TBCR (POTASSIUM CHLORIDE) 1 by mouth qd  #30 Tablet x 12   Entered and Authorized by:   Tresa Garter MD   Signed by:   Tresa Garter MD on 10/28/2009   Method used:   Print then Give to Patient   RxID:   [id number] VITAMIN B-12 CR 1000 MCG  TBCR (CYANOCOBALAMIN) monthly  #30 x 12   Entered and Authorized by:   Tresa Garter MD   Signed by:   Tresa Garter MD on 10/28/2009   Method used:   Print then Give to Patient   RxID:   [id number] HYTRIN 5 MG  CAPS (TERAZOSIN HCL) 1 at bedtime  #30 x 12   Entered and Authorized by:   Tresa Garter MD   Signed by:   Tresa Garter MD on 10/28/2009   Method used:   Print then Give to Patient   RxID:   [id number] LORAZEPAM 0.5 MG  TABS (LORAZEPAM) 1 three times a day as needed  #90 x 6   Entered and Authorized by:   Tresa Garter MD   Signed by:   Tresa Garter MD on 10/28/2009   Method used:   Print then Give to Patient   RxID:   [id number] LIPITOR 10 MG  TABS (ATORVASTATIN CALCIUM) once daily  #30 Not Speci x 12   Entered and Authorized by:   Tresa Garter MD   Signed by:   Tresa Garter MD on 10/28/2009   Method used:   Print then Give to Patient   RxID:   [id number] TOPROL XL 50 MG  TB24 (METOPROLOL SUCCINATE) once daily  #60 Tablet x 12   Entered and Authorized by:   Tresa Garter MD   Signed by:   Tresa Garter MD on 10/28/2009  Method used:   Print then Give to Patient   RxID:   [id number] PRILOSEC 20 MG  CPDR (OMEPRAZOLE) once daily  #90 Capsule x 12   Entered and Authorized by:   Tresa Garter MD   Signed by:   Tresa Garter MD on 10/28/2009   Method used:   Print then Give to Patient   RxID:   [id number] HYZAAR 50-12.5 MG  TABS (LOSARTAN POTASSIUM-HCTZ) once daily  #30 Tablet x 12   Entered and Authorized by:   Tresa Garter MD   Signed by:   Tresa Garter MD on 10/28/2009   Method used:   Print then Give to Patient   RxID:   [id number]

## 2010-09-11 NOTE — Progress Notes (Signed)
Summary: Vit b12  ??  Phone Note From Pharmacy   Caller: [facility]  [address] [phone]* Summary of Call: Is the 10-28-09 RX for Vit b12 for tabs or vial?  and  What is correct sig???? Initial call taken by: Lanier Prude, [facility]),  April 11, 2010 3:19 PM  Follow-up for Phone Call        What was she using prior? Follow-up by: Tresa Garter MD,  April 11, 2010 5:48 PM  Additional Follow-up for Phone Call Additional follow up Details #1::        EMR has B12 injection subq or IM once mthly Additional Follow-up by: Lamar Sprinkles, CMA,  April 11, 2010 5:58 PM    Additional Follow-up for Phone Call Additional follow up Details #2::    Sorry - cont w/inj - refill x 12 months Thx! Follow-up by: Tresa Garter MD,  April 11, 2010 5:59 PM  New/Updated Medications: CYANOCOBALAMIN 1000 MCG/ML SOLN (CYANOCOBALAMIN) 1 mL once monthly subq or IM Prescriptions: CYANOCOBALAMIN 1000 MCG/ML SOLN (CYANOCOBALAMIN) 1 mL once monthly subq or IM  #71mth x 12   Entered by:   Lamar Sprinkles, CMA   Authorized by:   Tresa Garter MD   Signed by:   Lamar Sprinkles, CMA on 04/11/2010   Method used:   Electronically to        [facility] [phone]* (retail)       [address]       [place], [place]  [address]       Ph: [phone]       Fax: [phone]   RxID:   [id number]

## 2010-10-15 ENCOUNTER — Telehealth: Payer: Self-pay | Admitting: Internal Medicine

## 2010-10-21 NOTE — Progress Notes (Signed)
Summary: Lorazepam Rf-Plot pt  Phone Note Refill Request Message from:  Fax from Pharmacy  Refills Requested: Medication #1:  LORAZEPAM 0.5 MG  TABS 1 three times a day as needed   Dosage confirmed as above?Dosage Confirmed   Supply Requested: 90   Last Refilled: 06/25/2010  Method Requested: Telephone to Pharmacy Initial call taken by: Lanier Prude, [facility]),  October 15, 2010 3:15 PM  Follow-up for Phone Call        ok to fill #90, no refills - thanks Follow-up by: Newt Lukes MD,  October 15, 2010 5:42 PM  Additional Follow-up for Phone Call Additional follow up Details #1::        Rx called to pharmacy Additional Follow-up by: Lanier Prude, [facility]),  October 16, 2010 8:08 AM    Prescriptions: LORAZEPAM 0.5 MG  TABS (LORAZEPAM) 1 three times a day as needed  #90 x 0   Entered by:   Lanier Prude, CMA(AAMA)   Authorized by:   Tresa Garter MD   Signed by:   Lanier Prude, CMA(AAMA) on 10/16/2010   Method used:   Telephoned to ...       [facility]  [address] [phone]* (retail)       [address]       [place], [place]  [address]       Ph: [phone]       Fax: [phone]   RxID:   [id number]

## 2010-10-30 ENCOUNTER — Other Ambulatory Visit: Payer: Self-pay | Admitting: Internal Medicine

## 2010-12-15 ENCOUNTER — Other Ambulatory Visit: Payer: Self-pay | Admitting: Internal Medicine

## 2010-12-15 NOTE — Telephone Encounter (Signed)
rec rf req for Lorazepam 0.5mg  1 po tid prn. # 90. Last filled 10-16-10. Ok to Rf?

## 2010-12-15 NOTE — Telephone Encounter (Signed)
OK to fill this prescription with additional refills x1 Thank you!  

## 2010-12-16 MED ORDER — LORAZEPAM 0.5 MG PO TABS: 0.5000 mg | ORAL_TABLET | Freq: Three times a day (TID) | ORAL | Status: DC | PRN

## 2010-12-18 ENCOUNTER — Other Ambulatory Visit: Payer: Self-pay | Admitting: Internal Medicine

## 2010-12-19 ENCOUNTER — Encounter: Payer: Self-pay | Admitting: Internal Medicine

## 2010-12-22 ENCOUNTER — Encounter: Payer: Self-pay | Admitting: Internal Medicine

## 2010-12-22 ENCOUNTER — Ambulatory Visit (INDEPENDENT_AMBULATORY_CARE_PROVIDER_SITE_OTHER): Payer: 59 | Admitting: Internal Medicine

## 2010-12-22 DIAGNOSIS — I10 Essential (primary) hypertension: Secondary | ICD-10-CM

## 2010-12-22 DIAGNOSIS — E7800 Pure hypercholesterolemia, unspecified: Secondary | ICD-10-CM

## 2010-12-22 DIAGNOSIS — E538 Deficiency of other specified B group vitamins: Secondary | ICD-10-CM

## 2010-12-22 DIAGNOSIS — K219 Gastro-esophageal reflux disease without esophagitis: Secondary | ICD-10-CM

## 2010-12-22 DIAGNOSIS — E559 Vitamin D deficiency, unspecified: Secondary | ICD-10-CM

## 2010-12-22 NOTE — Assessment & Plan Note (Signed)
PRN Meds

## 2010-12-22 NOTE — Assessment & Plan Note (Signed)
On Rx 

## 2010-12-22 NOTE — Assessment & Plan Note (Signed)
On PO B12 

## 2010-12-22 NOTE — Progress Notes (Signed)
  Subjective:    Patient ID: Savannah Howard, female    DOB: November 03, 1945, 64 y.o.   MRN: [id number]  HPI The patient presents for a follow-up of  chronic hypertension, chronic dyslipidemia, type 2 diabetes controlled with medicines. C/o LBP    Review of Systems  HENT: Negative for nosebleeds.   Respiratory: Negative for cough.   Genitourinary: Negative for flank pain.  Musculoskeletal: Positive for back pain. Negative for joint swelling.  Neurological: Negative for numbness.  Psychiatric/Behavioral: Negative for confusion.   Wt Readings from Last 3 Encounters:  12/22/10 242 lb (109.77 kg)  08/12/10 242 lb (109.77 kg)  04/30/10 242 lb (109.77 kg)        Objective:   Physical Exam  Constitutional: She appears well-developed and well-nourished. No distress.       obese  HENT:  Head: Normocephalic.  Right Ear: External ear normal.  Left Ear: External ear normal.  Nose: Nose normal.  Mouth/Throat: Oropharynx is clear and moist.  Eyes: Conjunctivae are normal. Pupils are equal, round, and reactive to light. Right eye exhibits no discharge. Left eye exhibits no discharge.  Neck: Normal range of motion. Neck supple. No JVD present. No tracheal deviation present. No thyromegaly present.  Cardiovascular: Normal rate, regular rhythm and normal heart sounds.   Pulmonary/Chest: No stridor. No respiratory distress. She has no wheezes.  Abdominal: Soft. Bowel sounds are normal. She exhibits no distension and no mass. There is no tenderness. There is no rebound and no guarding.  Musculoskeletal: She exhibits no edema and no tenderness.  Lymphadenopathy:    She has no cervical adenopathy.  Neurological: She displays normal reflexes. No cranial nerve deficit. She exhibits normal muscle tone. Coordination normal.  Skin: No rash noted. No erythema.  Psychiatric: She has a normal mood and affect. Her behavior is normal. Judgment and thought content normal.          Assessment & Plan:    VITAMIN B12 DEFICIENCY On PO B12  VITAMIN D DEFICIENCY On Rx  GERD PRN Meds

## 2010-12-26 NOTE — Assessment & Plan Note (Signed)
[facility]                           PRIMARY CARE OFFICE NOTE   Savannah Howard, Savannah Howard                      MRN:          [id number]  DATE:07/25/2006                            DOB:          11-Oct-1945    The patient is a 65 year old female who presents for a wellness  examination.   PAST MEDICAL HISTORY/FAMILY HISTORY/SOCIAL HISTORY:  As per March 23, 2005 note.  Changed jobs now, working for [employer].   CURRENT MEDICATIONS:  Reviewed with the patient in detail.  She has been  out of potassium tablets.   ALLERGIES:  Sulfa drugs.   REVIEW OF SYSTEMS:  No chest pain or shortness of breath.  No syncope.  No neurological complaints.  No urinary troubles.  The rest is negative.  The rest of the 18-point system review is negative.   PHYSICAL:  Blood pressure 139/80, pulse 69, temperature 99, weight 234  pounds.  She is in no acute distress.  She is overweight.  HEENT:  Moist mucosa.  NECK:  Supple.  No thyromegaly or bruits.  LUNGS:  Clear to auscultation and percussion.  No wheeze or rales.  HEART:  S1, S2 no murmur.  No gallop.  ABDOMEN:  Soft and nontender.  No organomegaly.  No mass felt.  LOWER EXTREMITIES:  Without edema.  She is alert and oriented, cooperative.  Denies being depressed.  Clear  with aging changes.  Grips assessment and strength within normal limits.   Labs on July 15, 2006.  CBC normal.  Cholesterol 149.  Triglycerides  212.  Potassium 3.3, glucose 112.  TSH normal.  Urinalysis with 10-20  WBCs.  EKG normal.  Chest x-ray was done in 2006.   ASSESSMENT AND PLAN:  1. Normal wellness examination.  Age/health related issues discussed.      Healthy life-style discussed.  Her GYN care is up to date with Dr.      Katrinka Blazing.  2. Urinary tract infection.  Cipro 250.  3. Zostavax information provided.  4. Repeat exam in 12 months.     Georgina Quint. [name], MD  Electronically Signed   AVP/MedQ  DD: 07/25/2006  DT: 07/25/2006  Job  #: [id number]

## 2010-12-26 NOTE — Assessment & Plan Note (Signed)
[facility]                           PRIMARY CARE OFFICE NOTE   ALEXANDRA, LIPPS                      MRN:          [id number]  DATE:07/25/2006                            DOB:          1945-12-21    SEPERATE EVALUATION AND MANAGEMENT:  Today we need to address problems  with urinary tract infection, hypertension, hypokalemia, B12 deficiency,  elevated glucose.   PAST MEDICAL HISTORY/FAMILY HISTORY/SOCIAL HISTORY:  As per March 23, 2005 note.  Changed jobs now, working for [employer].   CURRENT MEDICATIONS:  Reviewed with the patient in detail.  She has been  out of potassium tablets.   ALLERGIES:  Sulfa drugs.   REVIEW OF SYSTEMS:  No chest pain or shortness of breath.  No syncope.  No neurological complaints.  No urinary troubles.  The rest is negative.  The rest of the 18-point system review is negative.   PHYSICAL:  Blood pressure 139/80, pulse 69, temperature 99, weight 234  pounds.  She is in no acute distress.  She is overweight.  HEENT:  Moist mucosa.  NECK:  Supple.  No thyromegaly or bruits.  LUNGS:  Clear to auscultation and percussion.  No wheeze or rales.  HEART:  S1, S2 no murmur.  No gallop.  ABDOMEN:  Soft and nontender.  No organomegaly.  No mass felt.  LOWER EXTREMITIES:  Without edema.  She is alert and oriented, cooperative.  Denies being depressed.  Clear  with aging changes.  Grips assessment and strength within normal limits.   Labs on July 15, 2006.  CBC normal.  Cholesterol 149.  Triglycerides  212.  Potassium 3.3, glucose 112.  TSH normal.  Urinalysis with 10-20  WBCs.  EKG normal.  Chest x-ray was done in 2006.   ASSESSMENT AND PLAN:  1. Urinary tract infection.  The patient prescribed Cipro 250 mg p.o.      b.i.d. for 5 days.  2. Hypertension.  Restart potassium chloride 20 mg daily.  3. Vitamin B12 deficiency.  IM injection with vitamin B12 1000 mcg      every 2 weeks.  4. Elevated glucose.  Needs to lose  weight and exercise.  Repeat labs      with A1C in 6 months.     Georgina Quint. [name], MD  Electronically Signed    AVP/MedQ  DD: 07/25/2006  DT: 07/25/2006  Job #: [id number]   cc:   Laqueta Linden, M.D.  Griffith Citron, M.D.

## 2011-01-26 ENCOUNTER — Other Ambulatory Visit: Payer: Self-pay | Admitting: Internal Medicine

## 2011-01-26 MED ORDER — LORAZEPAM 0.5 MG PO TABS: 0.5000 mg | ORAL_TABLET | Freq: Three times a day (TID) | ORAL | Status: DC | PRN

## 2011-01-26 NOTE — Telephone Encounter (Signed)
rec rf req for Lorazepam 0.5mg  1 tid # 90. Last filled 10-16-10. Ok to Rf?

## 2011-01-26 NOTE — Telephone Encounter (Signed)
OK to fill this prescription with additional refills x5 Thank you!  

## 2011-04-14 ENCOUNTER — Other Ambulatory Visit: Payer: Self-pay | Admitting: *Deleted

## 2011-04-14 MED ORDER — ATORVASTATIN CALCIUM 10 MG PO TABS: 10.0000 mg | ORAL_TABLET | Freq: Every day | ORAL | Status: DC

## 2011-06-23 ENCOUNTER — Encounter: Payer: Self-pay | Admitting: Internal Medicine

## 2011-06-23 ENCOUNTER — Ambulatory Visit (INDEPENDENT_AMBULATORY_CARE_PROVIDER_SITE_OTHER): Payer: 59 | Admitting: Internal Medicine

## 2011-06-23 VITALS — BP 172/92 | HR 66 | Temp 98.5°F | Wt 243.0 lb

## 2011-06-23 DIAGNOSIS — I10 Essential (primary) hypertension: Secondary | ICD-10-CM

## 2011-06-23 DIAGNOSIS — Z0000 Encounter for general adult medical examination without abnormal findings: Secondary | ICD-10-CM

## 2011-06-23 DIAGNOSIS — E785 Hyperlipidemia, unspecified: Secondary | ICD-10-CM | POA: Insufficient documentation

## 2011-06-23 DIAGNOSIS — F411 Generalized anxiety disorder: Secondary | ICD-10-CM

## 2011-06-23 DIAGNOSIS — R7309 Other abnormal glucose: Secondary | ICD-10-CM

## 2011-06-23 DIAGNOSIS — R739 Hyperglycemia, unspecified: Secondary | ICD-10-CM

## 2011-06-23 DIAGNOSIS — E538 Deficiency of other specified B group vitamins: Secondary | ICD-10-CM

## 2011-06-23 MED ORDER — LOSARTAN POTASSIUM-HCTZ 100-25 MG PO TABS: 1.0000 | ORAL_TABLET | Freq: Every day | ORAL | Status: DC

## 2011-06-23 NOTE — Assessment & Plan Note (Signed)
Continue with current prescription therapy as reflected on the Med list.  

## 2011-06-23 NOTE — Progress Notes (Signed)
  Subjective:    Patient ID: Savannah Howard, female    DOB: Dec 17, 1945, 65 y.o.   MRN: [id number]  HPI  The patient presents for a follow-up of  chronic hypertension, chronic dyslipidemia, stress controlled with medicines    Review of Systems  Constitutional: Negative for chills, activity change, appetite change, fatigue and unexpected weight change.  HENT: Negative for congestion, mouth sores and sinus pressure.   Eyes: Negative for visual disturbance.  Respiratory: Negative for cough and chest tightness.   Gastrointestinal: Negative for nausea and abdominal pain.  Genitourinary: Negative for frequency, difficulty urinating and vaginal pain.  Musculoskeletal: Negative for back pain and gait problem.  Skin: Negative for pallor and rash.  Neurological: Negative for dizziness, tremors, weakness, numbness and headaches.  Psychiatric/Behavioral: Negative for confusion and sleep disturbance. The patient is nervous/anxious.        Objective:   Physical Exam  Constitutional: She appears well-developed. No distress.       obese  HENT:  Head: Normocephalic.  Right Ear: External ear normal.  Left Ear: External ear normal.  Nose: Nose normal.  Mouth/Throat: Oropharynx is clear and moist.  Eyes: Conjunctivae are normal. Pupils are equal, round, and reactive to light. Right eye exhibits no discharge. Left eye exhibits no discharge.  Neck: Normal range of motion. Neck supple. No JVD present. No tracheal deviation present. No thyromegaly present.  Cardiovascular: Normal rate, regular rhythm and normal heart sounds.   Pulmonary/Chest: No stridor. No respiratory distress. She has no wheezes.  Abdominal: Soft. Bowel sounds are normal. She exhibits no distension and no mass. There is no tenderness. There is no rebound and no guarding.  Musculoskeletal: She exhibits no edema and no tenderness.  Lymphadenopathy:    She has no cervical adenopathy.  Neurological: She displays normal reflexes. No  cranial nerve deficit. She exhibits normal muscle tone. Coordination normal.  Skin: No rash noted. No erythema.  Psychiatric: She has a normal mood and affect. Her behavior is normal. Judgment and thought content normal.   Lab Results  Component Value Date   WBC 6.9 04/30/2010   HGB 14.3 04/30/2010   HCT 41.5 04/30/2010   PLT 223.0 04/30/2010   GLUCOSE 116* 08/12/2010   CHOL 164 04/30/2010   TRIG 215.0* 04/30/2010   HDL 39.60 04/30/2010   LDLDIRECT 94.0 04/30/2010   LDLCALC 77 04/24/2009   ALT 32 04/30/2010   AST 31 04/30/2010   NA 142 08/12/2010   K 3.8 08/12/2010   CL 107 08/12/2010   CREATININE 0.7 08/12/2010   BUN 14 08/12/2010   CO2 23 08/12/2010   TSH 1.57 04/30/2010   HGBA1C 6.2 08/12/2010          Assessment & Plan:

## 2011-06-23 NOTE — Assessment & Plan Note (Addendum)
Continue with current prescription therapy as reflected on the Med list. Increased Hyzaar dose BP Readings from Last 3 Encounters:  06/23/11 172/92  12/22/10 120/78  08/12/10 152/100

## 2011-06-24 ENCOUNTER — Other Ambulatory Visit: Payer: Self-pay | Admitting: *Deleted

## 2011-06-24 MED ORDER — METOPROLOL SUCCINATE ER 50 MG PO TB24: 50.0000 mg | ORAL_TABLET | Freq: Every day | ORAL | Status: DC

## 2011-08-26 ENCOUNTER — Telehealth: Payer: Self-pay | Admitting: *Deleted

## 2011-08-26 NOTE — Telephone Encounter (Signed)
OK to fill this prescription with additional refills x3 Thank you!  

## 2011-08-26 NOTE — Telephone Encounter (Signed)
Rf req for Alprazolam 0.5mg . Last filled 10.2.12. Ok to RF?

## 2011-08-27 MED ORDER — LORAZEPAM 0.5 MG PO TABS: 0.5000 mg | ORAL_TABLET | Freq: Three times a day (TID) | ORAL | Status: DC | PRN

## 2011-11-18 ENCOUNTER — Other Ambulatory Visit: Payer: Self-pay | Admitting: Internal Medicine

## 2011-12-22 ENCOUNTER — Ambulatory Visit (INDEPENDENT_AMBULATORY_CARE_PROVIDER_SITE_OTHER): Payer: 59 | Admitting: Internal Medicine

## 2011-12-22 ENCOUNTER — Encounter: Payer: Self-pay | Admitting: Internal Medicine

## 2011-12-22 VITALS — BP 150/82 | HR 66 | Temp 98.7°F | Resp 16 | Ht 64.0 in | Wt 249.0 lb

## 2011-12-22 DIAGNOSIS — I10 Essential (primary) hypertension: Secondary | ICD-10-CM

## 2011-12-22 DIAGNOSIS — K219 Gastro-esophageal reflux disease without esophagitis: Secondary | ICD-10-CM

## 2011-12-22 DIAGNOSIS — E538 Deficiency of other specified B group vitamins: Secondary | ICD-10-CM

## 2011-12-22 MED ORDER — METOPROLOL SUCCINATE ER 50 MG PO TB24: 50.0000 mg | ORAL_TABLET | Freq: Two times a day (BID) | ORAL | Status: DC

## 2011-12-22 NOTE — Assessment & Plan Note (Addendum)
Continue with current prescription therapy as reflected on the Med list. Increase metoprolol to bid BP Readings from Last 3 Encounters:  12/22/11 150/82  06/23/11 172/92  12/22/10 120/78

## 2011-12-22 NOTE — Assessment & Plan Note (Signed)
Continue with current prescription therapy as reflected on the Med list.  

## 2011-12-22 NOTE — Progress Notes (Signed)
Patient ID: Savannah Howard, female   DOB: October 11, 1945, 66 y.o.   MRN: [id number]  Subjective:    Patient ID: Savannah Howard, female    DOB: 12/08/1945, 66 y.o.   MRN: [id number]  HPI  The patient presents for a follow-up of  chronic hypertension, chronic dyslipidemia, stress controlled with medicines  BP Readings from Last 3 Encounters:  12/22/11 150/82  06/23/11 172/92  12/22/10 120/78   Wt Readings from Last 3 Encounters:  12/22/11 249 lb (112.946 kg)  06/23/11 243 lb (110.224 kg)  12/22/10 242 lb (109.77 kg)        Review of Systems  Constitutional: Negative for chills, activity change, appetite change, fatigue and unexpected weight change.  HENT: Negative for congestion, mouth sores and sinus pressure.   Eyes: Negative for visual disturbance.  Respiratory: Negative for cough and chest tightness.   Gastrointestinal: Negative for nausea and abdominal pain.  Genitourinary: Negative for frequency, difficulty urinating and vaginal pain.  Musculoskeletal: Negative for back pain and gait problem.  Skin: Negative for pallor and rash.  Neurological: Negative for dizziness, tremors, weakness, numbness and headaches.  Psychiatric/Behavioral: Negative for confusion and sleep disturbance. The patient is nervous/anxious.        Objective:   Physical Exam  Constitutional: She appears well-developed. No distress.       obese  HENT:  Head: Normocephalic.  Right Ear: External ear normal.  Left Ear: External ear normal.  Nose: Nose normal.  Mouth/Throat: Oropharynx is clear and moist.  Eyes: Conjunctivae are normal. Pupils are equal, round, and reactive to light. Right eye exhibits no discharge. Left eye exhibits no discharge.  Neck: Normal range of motion. Neck supple. No JVD present. No tracheal deviation present. No thyromegaly present.  Cardiovascular: Normal rate, regular rhythm and normal heart sounds.   Pulmonary/Chest: No stridor. No respiratory distress. She has no wheezes.   Abdominal: Soft. Bowel sounds are normal. She exhibits no distension and no mass. There is no tenderness. There is no rebound and no guarding.  Musculoskeletal: She exhibits no edema and no tenderness.  Lymphadenopathy:    She has no cervical adenopathy.  Neurological: She displays normal reflexes. No cranial nerve deficit. She exhibits normal muscle tone. Coordination normal.  Skin: No rash noted. No erythema.  Psychiatric: She has a normal mood and affect. Her behavior is normal. Judgment and thought content normal.   Lab Results  Component Value Date   WBC 6.9 04/30/2010   HGB 14.3 04/30/2010   HCT 41.5 04/30/2010   PLT 223.0 04/30/2010   GLUCOSE 116* 08/12/2010   CHOL 164 04/30/2010   TRIG 215.0* 04/30/2010   HDL 39.60 04/30/2010   LDLDIRECT 94.0 04/30/2010   LDLCALC 77 04/24/2009   ALT 32 04/30/2010   AST 31 04/30/2010   NA 142 08/12/2010   K 3.8 08/12/2010   CL 107 08/12/2010   CREATININE 0.7 08/12/2010   BUN 14 08/12/2010   CO2 23 08/12/2010   TSH 1.57 04/30/2010   HGBA1C 6.2 08/12/2010          Assessment & Plan:

## 2012-03-01 ENCOUNTER — Telehealth: Payer: Self-pay | Admitting: *Deleted

## 2012-03-01 MED ORDER — LORAZEPAM 0.5 MG PO TABS: 0.5000 mg | ORAL_TABLET | Freq: Three times a day (TID) | ORAL | Status: DC | PRN

## 2012-03-01 MED ORDER — OMEPRAZOLE 20 MG PO CPDR: 20.0000 mg | DELAYED_RELEASE_CAPSULE | Freq: Every day | ORAL | Status: DC

## 2012-03-01 MED ORDER — TERAZOSIN HCL 5 MG PO CAPS: 5.0000 mg | ORAL_CAPSULE | Freq: Every day | ORAL | Status: DC

## 2012-03-01 NOTE — Telephone Encounter (Signed)
Rf req for Lorazepam 0.5 mg last filled 1.17.13. Ok to RF?

## 2012-03-01 NOTE — Telephone Encounter (Signed)
OK to fill this prescription with additional refills x3 Thank you!  

## 2012-03-01 NOTE — Telephone Encounter (Signed)
Done

## 2012-04-26 ENCOUNTER — Encounter: Payer: Self-pay | Admitting: Internal Medicine

## 2012-04-26 ENCOUNTER — Other Ambulatory Visit (INDEPENDENT_AMBULATORY_CARE_PROVIDER_SITE_OTHER): Payer: 59

## 2012-04-26 ENCOUNTER — Ambulatory Visit (INDEPENDENT_AMBULATORY_CARE_PROVIDER_SITE_OTHER): Payer: 59 | Admitting: Internal Medicine

## 2012-04-26 VITALS — BP 146/88 | HR 88 | Temp 98.7°F | Resp 16 | Wt 247.0 lb

## 2012-04-26 DIAGNOSIS — E538 Deficiency of other specified B group vitamins: Secondary | ICD-10-CM

## 2012-04-26 DIAGNOSIS — K219 Gastro-esophageal reflux disease without esophagitis: Secondary | ICD-10-CM

## 2012-04-26 DIAGNOSIS — R7309 Other abnormal glucose: Secondary | ICD-10-CM

## 2012-04-26 DIAGNOSIS — F411 Generalized anxiety disorder: Secondary | ICD-10-CM

## 2012-04-26 DIAGNOSIS — E559 Vitamin D deficiency, unspecified: Secondary | ICD-10-CM

## 2012-04-26 DIAGNOSIS — I10 Essential (primary) hypertension: Secondary | ICD-10-CM

## 2012-04-26 DIAGNOSIS — Z0000 Encounter for general adult medical examination without abnormal findings: Secondary | ICD-10-CM

## 2012-04-26 DIAGNOSIS — R739 Hyperglycemia, unspecified: Secondary | ICD-10-CM

## 2012-04-26 LAB — CBC WITH DIFFERENTIAL/PLATELET
Basophils Absolute: 0 10*3/uL (ref 0.0–0.1)
Eosinophils Relative: 1.8 % (ref 0.0–5.0)
HCT: 39.4 % (ref 36.0–46.0)
Lymphocytes Relative: 43.6 % (ref 12.0–46.0)
Lymphs Abs: 3 10*3/uL (ref 0.7–4.0)
Monocytes Relative: 6.5 % (ref 3.0–12.0)
Neutrophils Relative %: 47.6 % (ref 43.0–77.0)
Platelets: 225 10*3/uL (ref 150.0–400.0)
RDW: 13.3 % (ref 11.5–14.6)
WBC: 6.9 10*3/uL (ref 4.5–10.5)

## 2012-04-26 LAB — BASIC METABOLIC PANEL
CO2: 24 mEq/L (ref 19–32)
Calcium: 8.8 mg/dL (ref 8.4–10.5)
Chloride: 97 mEq/L (ref 96–112)
Glucose, Bld: 128 mg/dL — ABNORMAL HIGH (ref 70–99)
Potassium: 3.4 mEq/L — ABNORMAL LOW (ref 3.5–5.1)
Sodium: 139 mEq/L (ref 135–145)

## 2012-04-26 LAB — URINALYSIS
Hgb urine dipstick: NEGATIVE
Ketones, ur: NEGATIVE
Leukocytes, UA: NEGATIVE
Specific Gravity, Urine: 1.02 (ref 1.000–1.030)
Urine Glucose: NEGATIVE
Urobilinogen, UA: 0.2 (ref 0.0–1.0)

## 2012-04-26 LAB — HEPATIC FUNCTION PANEL
ALT: 38 U/L — ABNORMAL HIGH (ref 0–35)
Bilirubin, Direct: 0.1 mg/dL (ref 0.0–0.3)
Total Bilirubin: 0.7 mg/dL (ref 0.3–1.2)
Total Protein: 7.4 g/dL (ref 6.0–8.3)

## 2012-04-26 LAB — LIPID PANEL
HDL: 41.9 mg/dL (ref >39.00)
LDL Cholesterol: 64 mg/dL (ref 0–99)
Total CHOL/HDL Ratio: 3
Triglycerides: 160 mg/dL — ABNORMAL HIGH (ref 0.0–149.0)
VLDL: 32 mg/dL (ref 0.0–40.0)

## 2012-04-26 LAB — SEDIMENTATION RATE: Sed Rate: 32 mm/hr — ABNORMAL HIGH (ref 0–22)

## 2012-04-26 LAB — HEMOGLOBIN A1C: Hgb A1c MFr Bld: 6.5 % (ref 4.6–6.5)

## 2012-04-26 LAB — VITAMIN B12: Vitamin B-12: 773 pg/mL (ref 211–911)

## 2012-04-26 MED ORDER — CYANOCOBALAMIN 1000 MCG/ML IJ SOLN: 1000.0000 ug | INTRAMUSCULAR | Status: DC

## 2012-04-26 NOTE — Assessment & Plan Note (Signed)
Monitoring

## 2012-04-26 NOTE — Assessment & Plan Note (Signed)
Continue with current prescription therapy as reflected on the Med list.  

## 2012-04-26 NOTE — Progress Notes (Signed)
   Subjective:    Patient ID: Savannah Howard, female    DOB: February 01, 1946, 66 y.o.   MRN: [id number]  HPI  The patient presents for a follow-up of  chronic hypertension, chronic dyslipidemia, stress controlled with medicines  BP Readings from Last 3 Encounters:  04/26/12 146/88  12/22/11 150/82  06/23/11 172/92   Wt Readings from Last 3 Encounters:  04/26/12 247 lb (112.038 kg)  12/22/11 249 lb (112.946 kg)  06/23/11 243 lb (110.224 kg)        Review of Systems  Constitutional: Negative for chills, activity change, appetite change, fatigue and unexpected weight change.  HENT: Negative for congestion, mouth sores and sinus pressure.   Eyes: Negative for visual disturbance.  Respiratory: Negative for cough and chest tightness.   Gastrointestinal: Negative for nausea and abdominal pain.  Genitourinary: Negative for frequency, difficulty urinating and vaginal pain.  Musculoskeletal: Negative for back pain and gait problem.  Skin: Negative for pallor and rash.  Neurological: Negative for dizziness, tremors, weakness, numbness and headaches.  Psychiatric/Behavioral: Negative for confusion and disturbed wake/sleep cycle. The patient is nervous/anxious.        Objective:   Physical Exam  Constitutional: She appears well-developed. No distress.       obese  HENT:  Head: Normocephalic.  Right Ear: External ear normal.  Left Ear: External ear normal.  Nose: Nose normal.  Mouth/Throat: Oropharynx is clear and moist.  Eyes: Conjunctivae normal are normal. Pupils are equal, round, and reactive to light. Right eye exhibits no discharge. Left eye exhibits no discharge.  Neck: Normal range of motion. Neck supple. No JVD present. No tracheal deviation present. No thyromegaly present.  Cardiovascular: Normal rate, regular rhythm and normal heart sounds.   Pulmonary/Chest: No stridor. No respiratory distress. She has no wheezes.  Abdominal: Soft. Bowel sounds are normal. She exhibits no  distension and no mass. There is no tenderness. There is no rebound and no guarding.  Musculoskeletal: She exhibits no edema and no tenderness.  Lymphadenopathy:    She has no cervical adenopathy.  Neurological: She displays normal reflexes. No cranial nerve deficit. She exhibits normal muscle tone. Coordination normal.  Skin: No rash noted. No erythema.  Psychiatric: She has a normal mood and affect. Her behavior is normal. Judgment and thought content normal.   Lab Results  Component Value Date   WBC 6.9 04/30/2010   HGB 14.3 04/30/2010   HCT 41.5 04/30/2010   PLT 223.0 04/30/2010   GLUCOSE 116* 08/12/2010   CHOL 164 04/30/2010   TRIG 215.0* 04/30/2010   HDL 39.60 04/30/2010   LDLDIRECT 94.0 04/30/2010   LDLCALC 77 04/24/2009   ALT 32 04/30/2010   AST 31 04/30/2010   NA 142 08/12/2010   K 3.8 08/12/2010   CL 107 08/12/2010   CREATININE 0.7 08/12/2010   BUN 14 08/12/2010   CO2 23 08/12/2010   TSH 1.57 04/30/2010   HGBA1C 6.2 08/12/2010          Assessment & Plan:

## 2012-06-16 ENCOUNTER — Other Ambulatory Visit: Payer: Self-pay | Admitting: Internal Medicine

## 2012-07-26 ENCOUNTER — Other Ambulatory Visit: Payer: Self-pay | Admitting: Internal Medicine

## 2012-10-05 ENCOUNTER — Other Ambulatory Visit: Payer: Self-pay | Admitting: Internal Medicine

## 2012-10-26 ENCOUNTER — Other Ambulatory Visit (INDEPENDENT_AMBULATORY_CARE_PROVIDER_SITE_OTHER): Payer: 59

## 2012-10-26 ENCOUNTER — Encounter: Payer: Self-pay | Admitting: Internal Medicine

## 2012-10-26 ENCOUNTER — Ambulatory Visit (INDEPENDENT_AMBULATORY_CARE_PROVIDER_SITE_OTHER): Payer: 59 | Admitting: Internal Medicine

## 2012-10-26 VITALS — BP 120/64 | HR 76 | Temp 97.1°F | Resp 16 | Ht 63.0 in | Wt 248.0 lb

## 2012-10-26 DIAGNOSIS — I10 Essential (primary) hypertension: Secondary | ICD-10-CM

## 2012-10-26 DIAGNOSIS — R7309 Other abnormal glucose: Secondary | ICD-10-CM

## 2012-10-26 DIAGNOSIS — Z0000 Encounter for general adult medical examination without abnormal findings: Secondary | ICD-10-CM

## 2012-10-26 DIAGNOSIS — E785 Hyperlipidemia, unspecified: Secondary | ICD-10-CM

## 2012-10-26 DIAGNOSIS — K219 Gastro-esophageal reflux disease without esophagitis: Secondary | ICD-10-CM

## 2012-10-26 DIAGNOSIS — E559 Vitamin D deficiency, unspecified: Secondary | ICD-10-CM

## 2012-10-26 DIAGNOSIS — E538 Deficiency of other specified B group vitamins: Secondary | ICD-10-CM

## 2012-10-26 LAB — HEPATIC FUNCTION PANEL
ALT: 26 U/L (ref 0–35)
Albumin: 3.9 g/dL (ref 3.5–5.2)
Alkaline Phosphatase: 79 U/L (ref 39–117)
Bilirubin, Direct: 0.1 mg/dL (ref 0.0–0.3)
Total Protein: 7.6 g/dL (ref 6.0–8.3)

## 2012-10-26 LAB — CBC WITH DIFFERENTIAL/PLATELET
Basophils Absolute: 0 10*3/uL (ref 0.0–0.1)
Eosinophils Absolute: 0.1 10*3/uL (ref 0.0–0.7)
HCT: 40.1 % (ref 36.0–46.0)
Hemoglobin: 13.6 g/dL (ref 12.0–15.0)
Lymphs Abs: 2.5 10*3/uL (ref 0.7–4.0)
MCHC: 34 g/dL (ref 30.0–36.0)
Monocytes Absolute: 0.4 10*3/uL (ref 0.1–1.0)
Neutro Abs: 4.1 10*3/uL (ref 1.4–7.7)
Platelets: 252 10*3/uL (ref 150.0–400.0)
RDW: 13.4 % (ref 11.5–14.6)

## 2012-10-26 LAB — BASIC METABOLIC PANEL
BUN: 22 mg/dL (ref 6–23)
CO2: 26 mEq/L (ref 19–32)
Glucose, Bld: 148 mg/dL — ABNORMAL HIGH (ref 70–99)
Potassium: 3.4 mEq/L — ABNORMAL LOW (ref 3.5–5.1)
Sodium: 140 mEq/L (ref 135–145)

## 2012-10-26 LAB — URINALYSIS, ROUTINE W REFLEX MICROSCOPIC
Hgb urine dipstick: NEGATIVE
Ketones, ur: NEGATIVE
Leukocytes, UA: NEGATIVE
Urobilinogen, UA: 0.2 (ref 0.0–1.0)

## 2012-10-26 LAB — VITAMIN B12: Vitamin B-12: 490 pg/mL (ref 211–911)

## 2012-10-26 LAB — LIPID PANEL
HDL: 33.5 mg/dL — ABNORMAL LOW (ref >39.00)
Triglycerides: 215 mg/dL — ABNORMAL HIGH (ref 0.0–149.0)

## 2012-10-26 LAB — LDL CHOLESTEROL, DIRECT: Direct LDL: 77.3 mg/dL

## 2012-10-26 LAB — URINALYSIS
Ketones, ur: NEGATIVE
Leukocytes, UA: NEGATIVE
Specific Gravity, Urine: >=1.03 (ref 1.000–1.030)
Urine Glucose: NEGATIVE
Urobilinogen, UA: 0.2 (ref 0.0–1.0)

## 2012-10-26 LAB — TSH: TSH: 1.58 u[IU]/mL (ref 0.35–5.50)

## 2012-10-26 NOTE — Assessment & Plan Note (Addendum)
We discussed age appropriate health related issues, including available/recomended screening tests and vaccinations - declined. We discussed a need for adhering to healthy diet and exercise. Labs/EKG were reviewed/ordered. All questions were answered.  Mammo q 1-2 years She will see her GYN for PAP

## 2012-10-26 NOTE — Assessment & Plan Note (Signed)
Continue with current prescription therapy as reflected on the Med list.  

## 2012-10-26 NOTE — Assessment & Plan Note (Signed)
Continue with current  therapy as reflected on the Med list.  

## 2012-10-26 NOTE — Assessment & Plan Note (Signed)
Continue to monitor Wt loss

## 2012-10-26 NOTE — Progress Notes (Signed)
   Subjective:    HPI  The patient is here for a wellness exam. The patient has been doing well overall without major physical or psychological issues going on lately.  The patient presents for a follow-up of  chronic hypertension, chronic dyslipidemia, stress controlled with medicines.  BP Readings from Last 3 Encounters:  10/26/12 120/64  04/26/12 146/88  12/22/11 150/82   Wt Readings from Last 3 Encounters:  10/26/12 248 lb (112.492 kg)  04/26/12 247 lb (112.038 kg)  12/22/11 249 lb (112.946 kg)        Review of Systems  Constitutional: Negative for chills, activity change, appetite change, fatigue and unexpected weight change.  HENT: Negative for congestion, mouth sores and sinus pressure.   Eyes: Negative for visual disturbance.  Respiratory: Negative for cough and chest tightness.   Gastrointestinal: Negative for nausea and abdominal pain.  Genitourinary: Negative for frequency, difficulty urinating and vaginal pain.  Musculoskeletal: Negative for back pain and gait problem.  Skin: Negative for pallor and rash.  Neurological: Negative for dizziness, tremors, weakness, numbness and headaches.  Psychiatric/Behavioral: Negative for confusion and sleep disturbance. The patient is nervous/anxious.        Objective:   Physical Exam  Constitutional: She appears well-developed. No distress.  obese  HENT:  Head: Normocephalic.  Right Ear: External ear normal.  Left Ear: External ear normal.  Nose: Nose normal.  Mouth/Throat: Oropharynx is clear and moist.  Eyes: Conjunctivae are normal. Pupils are equal, round, and reactive to light. Right eye exhibits no discharge. Left eye exhibits no discharge.  Neck: Normal range of motion. Neck supple. No JVD present. No tracheal deviation present. No thyromegaly present.  Cardiovascular: Normal rate, regular rhythm and normal heart sounds.   Pulmonary/Chest: No stridor. No respiratory distress. She has no wheezes.  Abdominal:  Soft. Bowel sounds are normal. She exhibits no distension and no mass. There is no tenderness. There is no rebound and no guarding.  Musculoskeletal: She exhibits no edema and no tenderness.  Lymphadenopathy:    She has no cervical adenopathy.  Neurological: She displays normal reflexes. No cranial nerve deficit. She exhibits normal muscle tone. Coordination normal.  Skin: No rash noted. No erythema.  Psychiatric: She has a normal mood and affect. Her behavior is normal. Judgment and thought content normal.   Lab Results  Component Value Date   WBC 6.9 04/26/2012   HGB 13.1 04/26/2012   HCT 39.4 04/26/2012   PLT 225.0 04/26/2012   GLUCOSE 128* 04/26/2012   CHOL 138 04/26/2012   TRIG 160.0* 04/26/2012   HDL 41.90 04/26/2012   LDLDIRECT 94.0 04/30/2010   LDLCALC 64 04/26/2012   ALT 38* 04/26/2012   AST 33 04/26/2012   NA 139 04/26/2012   K 3.4* 04/26/2012   CL 97 04/26/2012   CREATININE 1.2 04/26/2012   BUN 16 04/26/2012   CO2 24 04/26/2012   TSH 1.26 04/26/2012   HGBA1C 6.5 04/26/2012          Assessment & Plan:

## 2012-11-07 ENCOUNTER — Other Ambulatory Visit: Payer: Self-pay | Admitting: Internal Medicine

## 2012-12-09 ENCOUNTER — Other Ambulatory Visit: Payer: Self-pay | Admitting: Internal Medicine

## 2013-03-30 ENCOUNTER — Telehealth: Payer: Self-pay | Admitting: *Deleted

## 2013-03-30 ENCOUNTER — Other Ambulatory Visit: Payer: Self-pay | Admitting: Internal Medicine

## 2013-03-30 ENCOUNTER — Other Ambulatory Visit: Payer: Self-pay | Admitting: *Deleted

## 2013-03-30 NOTE — Telephone Encounter (Signed)
Rf req for Lorazepam 0.5 mg. Last filled 07/26/2012. Ok to Rf?

## 2013-03-31 MED ORDER — LORAZEPAM 0.5 MG PO TABS: 0.5000 mg | ORAL_TABLET | Freq: Three times a day (TID) | ORAL | Status: DC | PRN

## 2013-03-31 NOTE — Telephone Encounter (Signed)
OK to fill this prescription with additional refills x5 Thank you!  

## 2013-03-31 NOTE — Telephone Encounter (Signed)
Done

## 2013-04-24 ENCOUNTER — Ambulatory Visit: Payer: 59 | Admitting: Internal Medicine

## 2013-04-24 DIAGNOSIS — Z0289 Encounter for other administrative examinations: Secondary | ICD-10-CM

## 2013-04-28 ENCOUNTER — Ambulatory Visit: Payer: 59 | Admitting: Internal Medicine

## 2013-05-01 ENCOUNTER — Ambulatory Visit (INDEPENDENT_AMBULATORY_CARE_PROVIDER_SITE_OTHER): Payer: 59 | Admitting: Internal Medicine

## 2013-05-01 ENCOUNTER — Encounter: Payer: Self-pay | Admitting: Internal Medicine

## 2013-05-01 VITALS — BP 128/72 | HR 76 | Temp 98.6°F | Resp 12 | Wt 240.0 lb

## 2013-05-01 DIAGNOSIS — E559 Vitamin D deficiency, unspecified: Secondary | ICD-10-CM

## 2013-05-01 DIAGNOSIS — E785 Hyperlipidemia, unspecified: Secondary | ICD-10-CM

## 2013-05-01 DIAGNOSIS — E538 Deficiency of other specified B group vitamins: Secondary | ICD-10-CM

## 2013-05-01 DIAGNOSIS — J309 Allergic rhinitis, unspecified: Secondary | ICD-10-CM

## 2013-05-01 DIAGNOSIS — I10 Essential (primary) hypertension: Secondary | ICD-10-CM

## 2013-05-01 MED ORDER — POTASSIUM CHLORIDE ER 10 MEQ PO CPCR: 10.0000 meq | ORAL_CAPSULE | Freq: Every day | ORAL | Status: DC

## 2013-05-01 MED ORDER — TERAZOSIN HCL 5 MG PO CAPS: ORAL_CAPSULE | ORAL | Status: DC

## 2013-05-01 MED ORDER — LOSARTAN POTASSIUM-HCTZ 100-25 MG PO TABS: 1.0000 | ORAL_TABLET | Freq: Every day | ORAL | Status: DC

## 2013-05-01 MED ORDER — OMEPRAZOLE 20 MG PO CPDR: DELAYED_RELEASE_CAPSULE | ORAL | Status: DC

## 2013-05-01 MED ORDER — METOPROLOL SUCCINATE ER 50 MG PO TB24: ORAL_TABLET | ORAL | Status: DC

## 2013-05-01 MED ORDER — LORAZEPAM 0.5 MG PO TABS: 0.5000 mg | ORAL_TABLET | Freq: Three times a day (TID) | ORAL | Status: DC | PRN

## 2013-05-01 MED ORDER — ATORVASTATIN CALCIUM 10 MG PO TABS: ORAL_TABLET | ORAL | Status: DC

## 2013-05-01 NOTE — Assessment & Plan Note (Signed)
Continue with current prescription therapy as reflected on the Med list.  

## 2013-05-01 NOTE — Progress Notes (Signed)
  Subjective:    Patient ID: Savannah Howard, female    DOB: [birth year], 67 y.o.   MRN: [id number]  HPI  The patient presents for a follow-up of  chronic hypertension, chronic dyslipidemia, anxiety with medicines  BP Readings from Last 3 Encounters:  05/01/13 128/72  10/26/12 120/64  04/26/12 146/88    Wt Readings from Last 3 Encounters:  05/01/13 240 lb (108.863 kg)  10/26/12 248 lb (112.492 kg)  04/26/12 247 lb (112.038 kg)      Review of Systems  Constitutional: Negative.  Negative for fever, chills, diaphoresis, activity change, appetite change, fatigue and unexpected weight change.  HENT: Negative for hearing loss, ear pain, nosebleeds, congestion, sore throat, facial swelling, rhinorrhea, sneezing, mouth sores, trouble swallowing, neck pain, neck stiffness, postnasal drip, sinus pressure and tinnitus.   Eyes: Negative for pain, discharge, redness, itching and visual disturbance.  Respiratory: Negative for cough, chest tightness, shortness of breath, wheezing and stridor.   Cardiovascular: Negative for chest pain, palpitations and leg swelling.  Gastrointestinal: Negative for nausea, diarrhea, constipation, blood in stool, abdominal distention, anal bleeding and rectal pain.  Genitourinary: Negative for dysuria, urgency, frequency, hematuria, flank pain, vaginal bleeding, vaginal discharge, difficulty urinating, genital sores and pelvic pain.  Musculoskeletal: Negative for back pain, joint swelling, arthralgias and gait problem.  Skin: Negative.  Negative for rash.  Neurological: Negative for dizziness, tremors, seizures, syncope, speech difficulty, weakness, numbness and headaches.  Hematological: Negative for adenopathy. Does not bruise/bleed easily.  Psychiatric/Behavioral: Negative for suicidal ideas, behavioral problems, sleep disturbance, dysphoric mood and decreased concentration. The patient is not nervous/anxious.        Objective:   Physical Exam  Constitutional:  She appears well-developed. No distress.  HENT:  Head: Normocephalic.  Right Ear: External ear normal.  Left Ear: External ear normal.  Nose: Nose normal.  Mouth/Throat: Oropharynx is clear and moist.  Eyes: Conjunctivae are normal. Pupils are equal, round, and reactive to light. Right eye exhibits no discharge. Left eye exhibits no discharge.  Neck: Normal range of motion. Neck supple. No JVD present. No tracheal deviation present. No thyromegaly present.  Cardiovascular: Normal rate, regular rhythm and normal heart sounds.   Pulmonary/Chest: No stridor. No respiratory distress. She has no wheezes.  Abdominal: Soft. Bowel sounds are normal. She exhibits no distension and no mass. There is no tenderness. There is no rebound and no guarding.  Musculoskeletal: She exhibits no edema and no tenderness.  Lymphadenopathy:    She has no cervical adenopathy.  Neurological: She displays normal reflexes. No cranial nerve deficit. She exhibits normal muscle tone. Coordination normal.  Skin: No rash noted. No erythema.  Psychiatric: She has a normal mood and affect. Her behavior is normal. Judgment and thought content normal.   Lab Results  Component Value Date   WBC 7.1 10/26/2012   HGB 13.6 10/26/2012   HCT 40.1 10/26/2012   PLT 252.0 10/26/2012   GLUCOSE 148* 10/26/2012   CHOL 155 10/26/2012   TRIG 215.0* 10/26/2012   HDL 33.50* 10/26/2012   LDLDIRECT 77.3 10/26/2012   LDLCALC 64 04/26/2012   ALT 26 10/26/2012   AST 27 10/26/2012   NA 140 10/26/2012   K 3.4* 10/26/2012   CL 101 10/26/2012   CREATININE 1.1 10/26/2012   BUN 22 10/26/2012   CO2 26 10/26/2012   TSH 1.58 10/26/2012   HGBA1C 6.5 04/26/2012          Assessment & Plan:

## 2013-10-30 ENCOUNTER — Encounter: Payer: Self-pay | Admitting: Internal Medicine

## 2013-10-30 ENCOUNTER — Other Ambulatory Visit (INDEPENDENT_AMBULATORY_CARE_PROVIDER_SITE_OTHER): Payer: 59

## 2013-10-30 ENCOUNTER — Ambulatory Visit (INDEPENDENT_AMBULATORY_CARE_PROVIDER_SITE_OTHER): Payer: 59 | Admitting: Internal Medicine

## 2013-10-30 VITALS — BP 150/84 | HR 64 | Temp 98.5°F | Resp 16 | Ht 63.0 in | Wt 235.0 lb

## 2013-10-30 DIAGNOSIS — E538 Deficiency of other specified B group vitamins: Secondary | ICD-10-CM

## 2013-10-30 DIAGNOSIS — Z0000 Encounter for general adult medical examination without abnormal findings: Secondary | ICD-10-CM

## 2013-10-30 DIAGNOSIS — F411 Generalized anxiety disorder: Secondary | ICD-10-CM

## 2013-10-30 DIAGNOSIS — K219 Gastro-esophageal reflux disease without esophagitis: Secondary | ICD-10-CM

## 2013-10-30 DIAGNOSIS — I10 Essential (primary) hypertension: Secondary | ICD-10-CM

## 2013-10-30 DIAGNOSIS — E559 Vitamin D deficiency, unspecified: Secondary | ICD-10-CM

## 2013-10-30 LAB — CBC WITH DIFFERENTIAL/PLATELET
BASOS PCT: 0.3 % (ref 0.0–3.0)
Basophils Absolute: 0 10*3/uL (ref 0.0–0.1)
EOS PCT: 1.9 % (ref 0.0–5.0)
Eosinophils Absolute: 0.1 10*3/uL (ref 0.0–0.7)
HCT: 41.9 % (ref 36.0–46.0)
HEMOGLOBIN: 14 g/dL (ref 12.0–15.0)
LYMPHS PCT: 31.4 % (ref 12.0–46.0)
Lymphs Abs: 2.3 10*3/uL (ref 0.7–4.0)
MCHC: 33.3 g/dL (ref 30.0–36.0)
MCV: 91.2 fl (ref 78.0–100.0)
Monocytes Absolute: 0.4 10*3/uL (ref 0.1–1.0)
Monocytes Relative: 5.9 % (ref 3.0–12.0)
Neutro Abs: 4.5 10*3/uL (ref 1.4–7.7)
Neutrophils Relative %: 60.5 % (ref 43.0–77.0)
Platelets: 230 10*3/uL (ref 150.0–400.0)
RBC: 4.59 Mil/uL (ref 3.87–5.11)
RDW: 13.6 % (ref 11.5–14.6)
WBC: 7.5 10*3/uL (ref 4.5–10.5)

## 2013-10-30 LAB — LIPID PANEL
CHOL/HDL RATIO: 4
Cholesterol: 151 mg/dL (ref 0–200)
HDL: 40.2 mg/dL (ref >39.00)
LDL CALC: 73 mg/dL (ref 0–99)
TRIGLYCERIDES: 187 mg/dL — AB (ref 0.0–149.0)
VLDL: 37.4 mg/dL (ref 0.0–40.0)

## 2013-10-30 LAB — BASIC METABOLIC PANEL
BUN: 26 mg/dL — ABNORMAL HIGH (ref 6–23)
CO2: 25 meq/L (ref 19–32)
Calcium: 9.5 mg/dL (ref 8.4–10.5)
Chloride: 104 mEq/L (ref 96–112)
Creatinine, Ser: 1.5 mg/dL — ABNORMAL HIGH (ref 0.4–1.2)
GFR: 36.62 mL/min — ABNORMAL LOW (ref >60.00)
GLUCOSE: 123 mg/dL — AB (ref 70–99)
POTASSIUM: 4 meq/L (ref 3.5–5.1)
SODIUM: 139 meq/L (ref 135–145)

## 2013-10-30 LAB — HEPATIC FUNCTION PANEL
ALBUMIN: 4.1 g/dL (ref 3.5–5.2)
ALT: 20 U/L (ref 0–35)
AST: 18 U/L (ref 0–37)
Alkaline Phosphatase: 75 U/L (ref 39–117)
Bilirubin, Direct: 0 mg/dL (ref 0.0–0.3)
Total Bilirubin: 0.4 mg/dL (ref 0.3–1.2)
Total Protein: 7.7 g/dL (ref 6.0–8.3)

## 2013-10-30 LAB — URIC ACID: URIC ACID, SERUM: 10.1 mg/dL — AB (ref 2.4–7.0)

## 2013-10-30 LAB — TSH: TSH: 1.1 u[IU]/mL (ref 0.35–5.50)

## 2013-10-30 LAB — VITAMIN B12: VITAMIN B 12: 935 pg/mL — AB (ref 211–911)

## 2013-10-30 NOTE — Progress Notes (Signed)
Pre visit review using our clinic review tool, if applicable. No additional management support is needed unless otherwise documented below in the visit note. 

## 2013-10-30 NOTE — Assessment & Plan Note (Signed)
Continue with current prescription therapy as reflected on the Med list.  

## 2013-10-30 NOTE — Assessment & Plan Note (Addendum)
We discussed age appropriate health related issues, including available/recomended screening tests and vaccinations - declined again. We discussed a need for adhering to healthy diet and exercise. Labs/EKG were reviewed/ordered. All questions were answered. Mammo q 1-2 years She will see her GYN for PAP Declined colonoscopy Cologuard discused

## 2013-10-30 NOTE — Progress Notes (Signed)
   Subjective:    HPI  The patient is here for a wellness exam. The patient presents for a follow-up of  chronic hypertension, chronic dyslipidemia, elevated glucose stress controlled with medicines.  BP Readings from Last 3 Encounters:  10/30/13 150/84  05/01/13 128/72  10/26/12 120/64   Wt Readings from Last 3 Encounters:  10/30/13 235 lb (106.595 kg)  05/01/13 240 lb (108.863 kg)  10/26/12 248 lb (112.492 kg)        Review of Systems  Constitutional: Negative for chills, activity change, appetite change, fatigue and unexpected weight change.  HENT: Negative for congestion, mouth sores and sinus pressure.   Eyes: Negative for visual disturbance.  Respiratory: Negative for cough and chest tightness.   Gastrointestinal: Negative for nausea and abdominal pain.  Genitourinary: Negative for frequency, difficulty urinating and vaginal pain.  Musculoskeletal: Negative for back pain and gait problem.  Skin: Negative for pallor and rash.  Neurological: Negative for dizziness, tremors, weakness, numbness and headaches.  Psychiatric/Behavioral: Negative for confusion and sleep disturbance. The patient is nervous/anxious.        Objective:   Physical Exam  Constitutional: She appears well-developed. No distress.  obese  HENT:  Head: Normocephalic.  Right Ear: External ear normal.  Left Ear: External ear normal.  Nose: Nose normal.  Mouth/Throat: Oropharynx is clear and moist.  Eyes: Conjunctivae are normal. Pupils are equal, round, and reactive to light. Right eye exhibits no discharge. Left eye exhibits no discharge.  Neck: Normal range of motion. Neck supple. No JVD present. No tracheal deviation present. No thyromegaly present.  Cardiovascular: Normal rate, regular rhythm and normal heart sounds.   Pulmonary/Chest: No stridor. No respiratory distress. She has no wheezes.  Abdominal: Soft. Bowel sounds are normal. She exhibits no distension and no mass. There is no  tenderness. There is no rebound and no guarding.  Musculoskeletal: She exhibits no edema and no tenderness.  Lymphadenopathy:    She has no cervical adenopathy.  Neurological: She displays normal reflexes. No cranial nerve deficit. She exhibits normal muscle tone. Coordination normal.  Skin: No rash noted. No erythema.  Psychiatric: She has a normal mood and affect. Her behavior is normal. Judgment and thought content normal.   Lab Results  Component Value Date   WBC 7.1 10/26/2012   HGB 13.6 10/26/2012   HCT 40.1 10/26/2012   PLT 252.0 10/26/2012   GLUCOSE 148* 10/26/2012   CHOL 155 10/26/2012   TRIG 215.0* 10/26/2012   HDL 33.50* 10/26/2012   LDLDIRECT 77.3 10/26/2012   LDLCALC 64 04/26/2012   ALT 26 10/26/2012   AST 27 10/26/2012   NA 140 10/26/2012   K 3.4* 10/26/2012   CL 101 10/26/2012   CREATININE 1.1 10/26/2012   BUN 22 10/26/2012   CO2 26 10/26/2012   TSH 1.58 10/26/2012   HGBA1C 6.5 04/26/2012    EKG no new changes      Assessment & Plan:

## 2013-10-31 ENCOUNTER — Other Ambulatory Visit: Payer: Self-pay | Admitting: Internal Medicine

## 2013-10-31 LAB — VITAMIN D 25 HYDROXY (VIT D DEFICIENCY, FRACTURES): VIT D 25 HYDROXY: 22 ng/mL — AB (ref 30–89)

## 2013-10-31 MED ORDER — ERGOCALCIFEROL 1.25 MG (50000 UT) PO CAPS: 50000.0000 [IU] | ORAL_CAPSULE | ORAL | Status: DC

## 2014-01-03 ENCOUNTER — Other Ambulatory Visit: Payer: Self-pay | Admitting: Internal Medicine

## 2014-05-02 ENCOUNTER — Ambulatory Visit: Payer: 59 | Admitting: Internal Medicine

## 2014-05-02 DIAGNOSIS — Z0289 Encounter for other administrative examinations: Secondary | ICD-10-CM

## 2014-05-23 ENCOUNTER — Other Ambulatory Visit: Payer: Self-pay | Admitting: Internal Medicine

## 2014-08-08 ENCOUNTER — Other Ambulatory Visit: Payer: Self-pay | Admitting: Internal Medicine

## 2014-08-08 NOTE — Telephone Encounter (Signed)
Faxed script back to [facility].../lmb 

## 2014-11-05 ENCOUNTER — Other Ambulatory Visit (INDEPENDENT_AMBULATORY_CARE_PROVIDER_SITE_OTHER): Payer: Managed Care, Other (non HMO)

## 2014-11-05 ENCOUNTER — Ambulatory Visit (INDEPENDENT_AMBULATORY_CARE_PROVIDER_SITE_OTHER): Payer: Managed Care, Other (non HMO) | Admitting: Internal Medicine

## 2014-11-05 ENCOUNTER — Encounter: Payer: Self-pay | Admitting: Internal Medicine

## 2014-11-05 VITALS — BP 138/80 | HR 68 | Ht 62.0 in | Wt 232.0 lb

## 2014-11-05 DIAGNOSIS — I10 Essential (primary) hypertension: Secondary | ICD-10-CM

## 2014-11-05 DIAGNOSIS — R739 Hyperglycemia, unspecified: Secondary | ICD-10-CM

## 2014-11-05 DIAGNOSIS — N63 Unspecified lump in unspecified breast: Secondary | ICD-10-CM

## 2014-11-05 DIAGNOSIS — Z23 Encounter for immunization: Secondary | ICD-10-CM

## 2014-11-05 DIAGNOSIS — E538 Deficiency of other specified B group vitamins: Secondary | ICD-10-CM

## 2014-11-05 DIAGNOSIS — Z0000 Encounter for general adult medical examination without abnormal findings: Secondary | ICD-10-CM

## 2014-11-05 LAB — URINALYSIS, ROUTINE W REFLEX MICROSCOPIC
Bilirubin Urine: NEGATIVE
Hgb urine dipstick: NEGATIVE
Ketones, ur: NEGATIVE
Nitrite: NEGATIVE
Specific Gravity, Urine: 1.01 (ref 1.000–1.030)
Total Protein, Urine: NEGATIVE
URINE GLUCOSE: NEGATIVE
UROBILINOGEN UA: 0.2 (ref 0.0–1.0)
pH: 6 (ref 5.0–8.0)

## 2014-11-05 LAB — HEPATIC FUNCTION PANEL
ALT: 14 U/L (ref 0–35)
AST: 15 U/L (ref 0–37)
Albumin: 4.1 g/dL (ref 3.5–5.2)
Alkaline Phosphatase: 68 U/L (ref 39–117)
BILIRUBIN DIRECT: 0.1 mg/dL (ref 0.0–0.3)
Total Bilirubin: 0.4 mg/dL (ref 0.2–1.2)
Total Protein: 7.9 g/dL (ref 6.0–8.3)

## 2014-11-05 LAB — CBC WITH DIFFERENTIAL/PLATELET
Basophils Absolute: 0 10*3/uL (ref 0.0–0.1)
Basophils Relative: 0.3 % (ref 0.0–3.0)
EOS ABS: 0 10*3/uL (ref 0.0–0.7)
Eosinophils Relative: 0.4 % (ref 0.0–5.0)
HCT: 41.7 % (ref 36.0–46.0)
HEMOGLOBIN: 14.2 g/dL (ref 12.0–15.0)
Lymphocytes Relative: 29 % (ref 12.0–46.0)
Lymphs Abs: 2.5 10*3/uL (ref 0.7–4.0)
MCHC: 34 g/dL (ref 30.0–36.0)
MCV: 90.3 fl (ref 78.0–100.0)
Monocytes Absolute: 0.5 10*3/uL (ref 0.1–1.0)
Monocytes Relative: 6.3 % (ref 3.0–12.0)
NEUTROS PCT: 64 % (ref 43.0–77.0)
Neutro Abs: 5.5 10*3/uL (ref 1.4–7.7)
Platelets: 256 10*3/uL (ref 150.0–400.0)
RBC: 4.61 Mil/uL (ref 3.87–5.11)
RDW: 14.8 % (ref 11.5–15.5)
WBC: 8.7 10*3/uL (ref 4.0–10.5)

## 2014-11-05 LAB — PROTIME-INR
INR: 1 ratio (ref 0.8–1.0)
Prothrombin Time: 11.5 s (ref 9.6–13.1)

## 2014-11-05 LAB — BASIC METABOLIC PANEL
BUN: 19 mg/dL (ref 6–23)
CO2: 28 mEq/L (ref 19–32)
CREATININE: 1.15 mg/dL (ref 0.40–1.20)
Calcium: 9.8 mg/dL (ref 8.4–10.5)
Chloride: 100 mEq/L (ref 96–112)
GFR: 49.62 mL/min — ABNORMAL LOW (ref >60.00)
Glucose, Bld: 118 mg/dL — ABNORMAL HIGH (ref 70–99)
Potassium: 4.2 mEq/L (ref 3.5–5.1)
SODIUM: 137 meq/L (ref 135–145)

## 2014-11-05 LAB — LIPID PANEL
Cholesterol: 152 mg/dL (ref 0–200)
HDL: 45 mg/dL (ref >39.00)
LDL Cholesterol: 69 mg/dL (ref 0–99)
NonHDL: 107
Total CHOL/HDL Ratio: 3
Triglycerides: 189 mg/dL — ABNORMAL HIGH (ref 0.0–149.0)
VLDL: 37.8 mg/dL (ref 0.0–40.0)

## 2014-11-05 LAB — VITAMIN B12: VITAMIN B 12: 1471 pg/mL — AB (ref 211–911)

## 2014-11-05 LAB — TSH: TSH: 1.49 u[IU]/mL (ref 0.35–4.50)

## 2014-11-05 LAB — HEMOGLOBIN A1C: HEMOGLOBIN A1C: 6.4 % (ref 4.6–6.5)

## 2014-11-05 MED ORDER — TERAZOSIN HCL 5 MG PO CAPS: 5.0000 mg | ORAL_CAPSULE | Freq: Every day | ORAL | Status: DC

## 2014-11-05 MED ORDER — METOPROLOL SUCCINATE ER 50 MG PO TB24: 50.0000 mg | ORAL_TABLET | Freq: Two times a day (BID) | ORAL | Status: DC

## 2014-11-05 MED ORDER — LOSARTAN POTASSIUM-HCTZ 100-25 MG PO TABS: 1.0000 | ORAL_TABLET | Freq: Every day | ORAL | Status: DC

## 2014-11-05 MED ORDER — ATORVASTATIN CALCIUM 10 MG PO TABS: 10.0000 mg | ORAL_TABLET | Freq: Every day | ORAL | Status: DC

## 2014-11-05 MED ORDER — CYANOCOBALAMIN 1000 MCG/ML IJ SOLN: 1000.0000 ug | INTRAMUSCULAR | Status: DC

## 2014-11-05 MED ORDER — LORAZEPAM 0.5 MG PO TABS: 0.5000 mg | ORAL_TABLET | Freq: Three times a day (TID) | ORAL | Status: DC | PRN

## 2014-11-05 MED ORDER — POTASSIUM CHLORIDE ER 10 MEQ PO CPCR: 10.0000 meq | ORAL_CAPSULE | Freq: Every day | ORAL | Status: DC

## 2014-11-05 NOTE — Progress Notes (Signed)
   Subjective:    HPI  The patient is here for a wellness exam.  The patient presents for a follow-up of  chronic hypertension, chronic dyslipidemia, elevated glucose stress controlled with medicines.  C/o L breast lump - pt noticed a lump 6 mo ago, but was hoping that this lump would go away; L nipple was itching too. Last mammo - "too long ago".  BP Readings from Last 3 Encounters:  11/05/14 138/80  10/30/13 150/84  05/01/13 128/72   Wt Readings from Last 3 Encounters:  11/05/14 232 lb (105.235 kg)  10/30/13 235 lb (106.595 kg)  05/01/13 240 lb (108.863 kg)        Review of Systems  Constitutional: Negative for chills, activity change, appetite change, fatigue and unexpected weight change.  HENT: Negative for congestion, mouth sores and sinus pressure.   Eyes: Negative for visual disturbance.  Respiratory: Negative for cough and chest tightness.   Gastrointestinal: Negative for nausea and abdominal pain.  Genitourinary: Negative for frequency, difficulty urinating and vaginal pain.  Musculoskeletal: Negative for back pain and gait problem.  Skin: Negative for pallor and rash.  Neurological: Negative for dizziness, tremors, weakness, numbness and headaches.  Psychiatric/Behavioral: Negative for confusion and sleep disturbance. The patient is nervous/anxious.        Objective:   Physical Exam  Constitutional: She appears well-developed. No distress.  obese  HENT:  Head: Normocephalic.  Right Ear: External ear normal.  Left Ear: External ear normal.  Nose: Nose normal.  Mouth/Throat: Oropharynx is clear and moist.  Eyes: Conjunctivae are normal. Pupils are equal, round, and reactive to light. Right eye exhibits no discharge. Left eye exhibits no discharge.  Neck: Normal range of motion. Neck supple. No JVD present. No tracheal deviation present. No thyromegaly present.  Cardiovascular: Normal rate, regular rhythm and normal heart sounds.   Pulmonary/Chest: No  stridor. No respiratory distress. She has no wheezes.  Abdominal: Soft. Bowel sounds are normal. She exhibits no distension and no mass. There is no tenderness. There is no rebound and no guarding.  Musculoskeletal: She exhibits no edema or tenderness.  Lymphadenopathy:    She has no cervical adenopathy.  Neurological: She displays normal reflexes. No cranial nerve deficit. She exhibits normal muscle tone. Coordination normal.  Skin: No rash noted. No erythema.  Psychiatric: She has a normal mood and affect. Her behavior is normal. Judgment and thought content normal.  L breast firm mass 6x4 cm, "exema around a nipple"; R breast - mo mass B axillae - no LNs   Lab Results  Component Value Date   WBC 7.5 10/30/2013   HGB 14.0 10/30/2013   HCT 41.9 10/30/2013   PLT 230.0 10/30/2013   GLUCOSE 123* 10/30/2013   CHOL 151 10/30/2013   TRIG 187.0* 10/30/2013   HDL 40.20 10/30/2013   LDLDIRECT 77.3 10/26/2012   LDLCALC 73 10/30/2013   ALT 20 10/30/2013   AST 18 10/30/2013   NA 139 10/30/2013   K 4.0 10/30/2013   CL 104 10/30/2013   CREATININE 1.5* 10/30/2013   BUN 26* 10/30/2013   CO2 25 10/30/2013   TSH 1.10 10/30/2013   HGBA1C 6.5 04/26/2012    EKG no new changes      Assessment & Plan:

## 2014-11-05 NOTE — Assessment & Plan Note (Addendum)
We discussed age appropriate health related issues, including available/recomended screening tests and vaccinations - declined. We discussed a need for adhering to healthy diet and exercise. Labs/EKG were reviewed/ordered. All questions were answered. Labs

## 2014-11-05 NOTE — Assessment & Plan Note (Signed)
3/16 L breast firm mass 6x4 cm, "exema around a nipple"; R breast - mo mass

## 2014-11-05 NOTE — Progress Notes (Signed)
Pre visit review using our clinic review tool, if applicable. No additional management support is needed unless otherwise documented below in the visit note. 

## 2014-11-05 NOTE — Assessment & Plan Note (Signed)
Hyzaar and Terazosin

## 2014-11-13 ENCOUNTER — Other Ambulatory Visit: Payer: Self-pay | Admitting: Internal Medicine

## 2014-11-13 DIAGNOSIS — N63 Unspecified lump in unspecified breast: Secondary | ICD-10-CM

## 2014-12-07 ENCOUNTER — Ambulatory Visit
Admission: RE | Admit: 2014-12-07 | Discharge: 2014-12-07 | Disposition: A | Discharge status: Home / Self Care | Payer: Managed Care, Other (non HMO) | Source: Ambulatory Visit | Attending: Internal Medicine | Admitting: Internal Medicine

## 2014-12-07 ENCOUNTER — Other Ambulatory Visit: Payer: Self-pay | Admitting: Internal Medicine

## 2014-12-07 DIAGNOSIS — N63 Unspecified lump in unspecified breast: Secondary | ICD-10-CM

## 2014-12-14 ENCOUNTER — Telehealth: Payer: Self-pay | Admitting: *Deleted

## 2014-12-14 NOTE — Telephone Encounter (Signed)
Scheduled and confirmed new pt appt with Dr. Lindi Adie on 12/20/14 at 3:45PM. Pt will pick up packet on appt date. Gave instructions, directions and contact information. Dr. Ethlyn Gallery office notified of pt appt.

## 2014-12-20 ENCOUNTER — Ambulatory Visit (HOSPITAL_BASED_OUTPATIENT_CLINIC_OR_DEPARTMENT_OTHER): Payer: Managed Care, Other (non HMO) | Admitting: Hematology and Oncology

## 2014-12-20 ENCOUNTER — Ambulatory Visit: Payer: Managed Care, Other (non HMO)

## 2014-12-20 ENCOUNTER — Encounter: Payer: Self-pay | Admitting: *Deleted

## 2014-12-20 ENCOUNTER — Encounter: Payer: Self-pay | Admitting: Hematology and Oncology

## 2014-12-20 VITALS — BP 129/60 | HR 68 | Temp 98.6°F | Resp 19 | Ht 62.0 in | Wt 230.4 lb

## 2014-12-20 DIAGNOSIS — Z170 Estrogen receptor positive status [ER+]: Secondary | ICD-10-CM | POA: Diagnosis not present

## 2014-12-20 DIAGNOSIS — C50412 Malignant neoplasm of upper-outer quadrant of left female breast: Secondary | ICD-10-CM | POA: Diagnosis not present

## 2014-12-20 HISTORY — DX: Malignant neoplasm of upper-outer quadrant of left female breast: C50.412

## 2014-12-20 MED ORDER — PROCHLORPERAZINE MALEATE 10 MG PO TABS: 10.0000 mg | ORAL_TABLET | Freq: Four times a day (QID) | ORAL | Status: DC | PRN

## 2014-12-20 MED ORDER — LIDOCAINE-PRILOCAINE 2.5-2.5 % EX CREA: TOPICAL_CREAM | CUTANEOUS | Status: DC

## 2014-12-20 MED ORDER — DEXAMETHASONE 4 MG PO TABS: 4.0000 mg | ORAL_TABLET | Freq: Two times a day (BID) | ORAL | Status: DC

## 2014-12-20 MED ORDER — ONDANSETRON HCL 8 MG PO TABS: 8.0000 mg | ORAL_TABLET | Freq: Two times a day (BID) | ORAL | Status: DC

## 2014-12-20 NOTE — Progress Notes (Signed)
Checked in new pt with no financial concerns.  Pt has my card for any billing questions or concerns. ° °

## 2014-12-20 NOTE — Progress Notes (Signed)
[facility] CONSULT NOTE  Patient Care Team: [name], MD as PCP - General  CHIEF COMPLAINTS/PURPOSE OF CONSULTATION:  Newly diagnosed breast cancer  HISTORY OF PRESENTING ILLNESS:  Savannah Howard 69 y.o. female is here because of recent diagnosis of left breast cancer.patient noticed an abnormality in the left breast that had caused some skin changes and she finally brought to the attention of her primary care physician obtain a mammogram that revealed a large left breast mass measuring 5.2 cm. 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 She saw Dr. Marlou Starks who recommended hematology oncology consultation for neoadjuvant therapy consideration. Patient is single and lives by herself and has a friend accompanied her today. She does not have any children or any near family members.  I reviewed her records extensively and collaborated the history with the patient.  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  She was never exposed to fertility medications or hormone replacement therapy.  She has no family history of Breast/GYN/GI cancer  MEDICAL HISTORY:  Past Medical History  Diagnosis Date  . GERD (gastroesophageal reflux disease)   . Hypertension   . Obesity    . Vitamin B 12 deficiency   . Vitamin D deficiency   . Anxiety   . Allergy     rhinitis    SURGICAL HISTORY: No past surgical history on file.  SOCIAL HISTORY: History   Social History  . Marital Status: Single    Spouse Name: N/A  . Number of Children: N/A  . Years of Education: N/A   Occupational History  . [employer]    Social History Main Topics  . Smoking status: Never Smoker   . Smokeless tobacco: Not on file  . Alcohol Use: No  . Drug Use: No  . Sexual Activity: Not on file   Other Topics Concern  . Not on file   Social History Narrative   Regular exercise- no    FAMILY HISTORY: Family History  Problem Relation Age of Onset  . Hypertension Other   . Heart disease Mother     CAD  . COPD Mother   . Heart disease Father 79    leaky valve  . Parkinsonism Father     ALLERGIES:  is allergic to penicillins and sulfonamide derivatives.  MEDICATIONS:  Current Outpatient Prescriptions  Medication Sig Dispense Refill  . atorvastatin (LIPITOR) 10 MG tablet Take 1 tablet (10 mg total) by mouth daily. 90 tablet 3  . cholecalciferol (VITAMIN D) 1000 UNITS tablet Take 1,000 Units by mouth daily.      . cyanocobalamin (,VITAMIN B-12,) 1000 MCG/ML injection Inject 1 mL (1,000 mcg total) into the muscle every 30 (thirty) days. 3 mL 3  . folic  acid (FOLVITE) 1 MG tablet Take 1 mg by mouth daily.      Marland Kitchen LORazepam (ATIVAN) 0.5 MG tablet Take 1 tablet (0.5 mg total) by mouth 3 (three) times daily as needed. for anxiety 270 tablet 1  . losartan-hydrochlorothiazide (HYZAAR) 100-25 MG per tablet Take 1 tablet by mouth daily. 90 tablet 3  . metoprolol succinate (TOPROL-XL) 50 MG 24 hr tablet Take 1 tablet (50 mg total) by mouth 2 (two) times daily. Take with or immediately following a meal. 180 tablet 2  . omeprazole (PRILOSEC) 20 MG capsule TAKE ONE CAPSULE BY MOUTH ONCE DAILY 90 capsule 1  . potassium chloride (MICRO-K) 10 MEQ CR capsule Take 1 capsule (10 mEq total)  by mouth daily. 90 capsule 3  . terazosin (HYTRIN) 5 MG capsule Take 1 capsule (5 mg total) by mouth at bedtime. 90 capsule 3  . dexamethasone (DECADRON) 4 MG tablet Take 1 tablet (4 mg total) by mouth 2 (two) times daily. Start the day before Taxotere. Then again the day after chemo for 3 days. 30 tablet 1  . lidocaine-prilocaine (EMLA) cream Apply to affected area once 30 g 3  . lidocaine-prilocaine (EMLA) cream Apply to affected area once 30 g 3  . ondansetron (ZOFRAN) 8 MG tablet Take 1 tablet (8 mg total) by mouth 2 (two) times daily. Start the day after chemo for 3 days. Then take as needed for nausea or vomiting. 30 tablet 1  . prochlorperazine (COMPAZINE) 10 MG tablet Take 1 tablet (10 mg total) by mouth every 6 (six) hours as needed (Nausea or vomiting). 30 tablet 1   No current facility-administered medications for this visit.    REVIEW OF SYSTEMS:   Constitutional: Denies fevers, chills or abnormal night sweats Eyes: Denies blurriness of vision, double vision or watery eyes Ears, nose, mouth, throat, and face: Denies mucositis or sore throat Respiratory: Denies cough, dyspnea or wheezes Cardiovascular: Denies palpitation, chest discomfort or lower extremity swelling Gastrointestinal:  Denies nausea, heartburn or change in bowel habits Skin: Denies abnormal skin rashes Lymphatics: Denies new lymphadenopathy or easy bruising Neurological:Denies numbness, tingling or new weaknesses Behavioral/Psych: Mood is stable, no new changes  Breast: left breast palpable abnormality All other systems were reviewed with the patient and are negative.  PHYSICAL EXAMINATION: ECOG PERFORMANCE STATUS: 1 - Symptomatic but completely ambulatory  Filed Vitals:   12/20/14 1544  BP: 129/60  Pulse: 68  Temp: 98.6 F (37 C)  Resp: 19   Filed Weights   12/20/14 1544  Weight: 230 lb 6.4 oz (104.509 kg)    GENERAL:alert, no distress and comfortable SKIN: skin color, texture, turgor are normal,  no rashes or significant lesions EYES: normal, conjunctiva are pink and non-injected, sclera clear OROPHARYNX:no exudate, no erythema and lips, buccal mucosa, and tongue normal  NECK: supple, thyroid normal size, non-tender, without nodularity LYMPH:  no palpable lymphadenopathy in the cervical, axillary or inguinal LUNGS: clear to auscultation and percussion with normal breathing effort HEART: regular rate & rhythm and no murmurs and no lower extremity edema ABDOMEN:abdomen soft, non-tender and normal bowel sounds Musculoskeletal:no cyanosis of digits and no clubbing  PSYCH: alert & oriented x 3 with fluent speech NEURO: no focal motor/sensory deficits LABORATORY DATA:  I have reviewed the data as listed Lab Results  Component Value Date   WBC 8.7 11/05/2014   HGB 14.2 11/05/2014   HCT 41.7 11/05/2014   MCV 90.3 11/05/2014   PLT 256.0 11/05/2014   Lab Results  Component Value  Date   NA 137 11/05/2014   K 4.2 11/05/2014   CL 100 11/05/2014   CO2 28 11/05/2014    RADIOGRAPHIC STUDIES: I have personally reviewed the radiological reports and agreed with the findings in the report. 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 After reviewing the pathology in detail, we proceeded to discuss the different treatment options between surgery, radiation, chemotherapy, antiestrogen therapies.  Recommendation based on multidisciplinary tumor board: 1. Neoadjuvant chemotherapy with TCH Perjeta 6 cycles  followed by Herceptin maintenance 2. After initial chemotherapy repeat MRI and then surgery 3. Adjuvant radiation therapy may be a consideration 4. Followed by antiestrogen therapy  Plan: 1. Breast MRI prior to neoadjuvant chemotherapy 2. Echocardiogram 3. Chemotherapy class 4. Port placement  Chemotherapy counseling:I discussed the risks and benefits of chemotherapy including the risks of nausea/ vomiting, risk of infection from low WBC count, fatigue due to chemo or anemia, bruising or bleeding due to low platelets, mouth sores, loss/ change in taste and decreased appetite. Liver and kidney function will be monitored through out chemotherapy as abnormalities in liver and kidney function may be a side effect of treatment. Cardiac dysfunction due to Herceptin and Perjeta were discussed in detail. I also discussed the risk of diarrhea from Perjeta. Risk of permanent bone marrow dysfunction and leukemia due to chemo were also discussed.  Return to clinic to start chemotherapy    All questions were answered. The patient knows to call the clinic with any problems, questions or concerns.    Rulon Eisenmenger, MD 5:11 PM

## 2014-12-20 NOTE — Progress Notes (Signed)
Met with pt during new pt visit with Dr. Lindi Adie. Gave navigation resources and discussed care plan. Encourage pt to call with questions or concerns. Received verbal understanding. Contact information given.

## 2014-12-20 NOTE — Assessment & Plan Note (Signed)
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 After reviewing the pathology in detail, we proceeded to discuss the different treatment options between surgery, radiation, chemotherapy, antiestrogen therapies.  Recommendation based on multidisciplinary tumor board: 1. Neoadjuvant chemotherapy with TCH Perjeta 6 cycles followed by Herceptin maintenance 2. After initial chemotherapy repeat MRI and then surgery 3. Adjuvant radiation therapy may be a consideration 4. Followed by antiestrogen therapy  Plan: 1. Breast MRI prior to neoadjuvant chemotherapy 2. Echocardiogram 3. Chemotherapy class 4. Port placement 5. CT chest abdomen and pelvis and bone scan for staging  Chemotherapy counseling:I discussed the risks and benefits of chemotherapy including the risks of nausea/ vomiting, risk of infection from low WBC count, fatigue due to chemo or anemia, bruising or bleeding due to low platelets, mouth sores, loss/ change in taste and decreased appetite. Liver and kidney function will be monitored through out chemotherapy as abnormalities in liver and kidney function may be a side effect of treatment. Cardiac dysfunction due to Herceptin and Perjeta were discussed in detail. I also discussed the risk of diarrhea from Perjeta. Risk of permanent bone marrow dysfunction and leukemia due to chemo were also discussed.  Return to clinic to start chemotherapy

## 2014-12-21 ENCOUNTER — Telehealth: Payer: Self-pay | Admitting: Hematology and Oncology

## 2014-12-21 ENCOUNTER — Other Ambulatory Visit: Payer: Self-pay | Admitting: Hematology and Oncology

## 2014-12-21 ENCOUNTER — Other Ambulatory Visit: Payer: Self-pay | Admitting: General Surgery

## 2014-12-21 ENCOUNTER — Telehealth: Payer: Self-pay | Admitting: *Deleted

## 2014-12-21 DIAGNOSIS — C50412 Malignant neoplasm of upper-outer quadrant of left female breast: Secondary | ICD-10-CM

## 2014-12-21 NOTE — Telephone Encounter (Signed)
Per staff message and POF I have scheduled appts. Advised scheduler of appts. JMW  

## 2014-12-21 NOTE — Telephone Encounter (Signed)
Called patient and she is aware of her appointments °

## 2014-12-25 ENCOUNTER — Encounter (HOSPITAL_BASED_OUTPATIENT_CLINIC_OR_DEPARTMENT_OTHER): Payer: Self-pay | Admitting: *Deleted

## 2014-12-26 ENCOUNTER — Ambulatory Visit (HOSPITAL_COMMUNITY): Payer: Managed Care, Other (non HMO)

## 2014-12-26 ENCOUNTER — Ambulatory Visit (HOSPITAL_BASED_OUTPATIENT_CLINIC_OR_DEPARTMENT_OTHER)
Admission: RE | Admit: 2014-12-26 | Discharge: 2014-12-26 | Disposition: A | Discharge status: Home / Self Care | Payer: Managed Care, Other (non HMO) | Source: Ambulatory Visit | Attending: General Surgery | Admitting: General Surgery

## 2014-12-26 ENCOUNTER — Other Ambulatory Visit: Payer: Self-pay

## 2014-12-26 ENCOUNTER — Encounter (HOSPITAL_BASED_OUTPATIENT_CLINIC_OR_DEPARTMENT_OTHER)
Admission: RE | Disposition: A | Discharge status: Home / Self Care | Payer: Self-pay | Source: Ambulatory Visit | Attending: General Surgery

## 2014-12-26 ENCOUNTER — Encounter (HOSPITAL_BASED_OUTPATIENT_CLINIC_OR_DEPARTMENT_OTHER): Payer: Self-pay | Admitting: *Deleted

## 2014-12-26 ENCOUNTER — Other Ambulatory Visit: Payer: Self-pay | Admitting: Hematology and Oncology

## 2014-12-26 ENCOUNTER — Other Ambulatory Visit: Payer: Self-pay | Admitting: *Deleted

## 2014-12-26 ENCOUNTER — Ambulatory Visit (HOSPITAL_BASED_OUTPATIENT_CLINIC_OR_DEPARTMENT_OTHER): Payer: Managed Care, Other (non HMO) | Admitting: Certified Registered"

## 2014-12-26 DIAGNOSIS — C50912 Malignant neoplasm of unspecified site of left female breast: Secondary | ICD-10-CM | POA: Insufficient documentation

## 2014-12-26 DIAGNOSIS — Z419 Encounter for procedure for purposes other than remedying health state, unspecified: Secondary | ICD-10-CM

## 2014-12-26 DIAGNOSIS — Z882 Allergy status to sulfonamides status: Secondary | ICD-10-CM | POA: Insufficient documentation

## 2014-12-26 DIAGNOSIS — Z880 Allergy status to penicillin: Secondary | ICD-10-CM | POA: Diagnosis not present

## 2014-12-26 DIAGNOSIS — F419 Anxiety disorder, unspecified: Secondary | ICD-10-CM | POA: Diagnosis not present

## 2014-12-26 DIAGNOSIS — Z95828 Presence of other vascular implants and grafts: Secondary | ICD-10-CM

## 2014-12-26 DIAGNOSIS — I10 Essential (primary) hypertension: Secondary | ICD-10-CM | POA: Diagnosis not present

## 2014-12-26 DIAGNOSIS — Z87442 Personal history of urinary calculi: Secondary | ICD-10-CM | POA: Diagnosis not present

## 2014-12-26 DIAGNOSIS — Z6841 Body Mass Index (BMI) 40.0 and over, adult: Secondary | ICD-10-CM | POA: Diagnosis not present

## 2014-12-26 DIAGNOSIS — T07XXXA Unspecified multiple injuries, initial encounter: Secondary | ICD-10-CM

## 2014-12-26 DIAGNOSIS — Z79899 Other long term (current) drug therapy: Secondary | ICD-10-CM | POA: Diagnosis not present

## 2014-12-26 DIAGNOSIS — E780 Pure hypercholesterolemia: Secondary | ICD-10-CM | POA: Diagnosis not present

## 2014-12-26 DIAGNOSIS — C801 Malignant (primary) neoplasm, unspecified: Secondary | ICD-10-CM

## 2014-12-26 DIAGNOSIS — K219 Gastro-esophageal reflux disease without esophagitis: Secondary | ICD-10-CM | POA: Diagnosis not present

## 2014-12-26 HISTORY — PX: PORTACATH PLACEMENT: SHX2246

## 2014-12-26 LAB — POCT I-STAT, CHEM 8
BUN: 45 mg/dL — AB (ref 6–20)
CALCIUM ION: 1.05 mmol/L — AB (ref 1.13–1.30)
Chloride: 102 mmol/L (ref 101–111)
Creatinine, Ser: 1 mg/dL (ref 0.44–1.00)
Glucose, Bld: 132 mg/dL — ABNORMAL HIGH (ref 65–99)
HEMATOCRIT: 44 % (ref 36.0–46.0)
Hemoglobin: 15 g/dL (ref 12.0–15.0)
Potassium: 5.7 mmol/L — ABNORMAL HIGH (ref 3.5–5.1)
Sodium: 138 mmol/L (ref 135–145)
TCO2: 21 mmol/L (ref 0–100)

## 2014-12-26 SURGERY — INSERTION, TUNNELED CENTRAL VENOUS DEVICE, WITH PORT
Anesthesia: General | Site: Chest | Laterality: Right

## 2014-12-26 MED ORDER — GLYCOPYRROLATE 0.2 MG/ML IJ SOLN: 0.2000 mg | Freq: Once | INTRAMUSCULAR | Status: DC | PRN

## 2014-12-26 MED ORDER — PROMETHAZINE HCL 25 MG/ML IJ SOLN: 6.2500 mg | INTRAMUSCULAR | Status: DC | PRN

## 2014-12-26 MED ORDER — BUPIVACAINE HCL (PF) 0.25 % IJ SOLN
INTRAMUSCULAR | Status: DC | PRN
  Administered 2014-12-26: 10 mL

## 2014-12-26 MED ORDER — HEPARIN SOD (PORK) LOCK FLUSH 100 UNIT/ML IV SOLN
INTRAVENOUS | Status: AC
  Filled 2014-12-26: qty 5

## 2014-12-26 MED ORDER — OXYCODONE-ACETAMINOPHEN 5-325 MG PO TABS: 1.0000 | ORAL_TABLET | ORAL | Status: DC | PRN

## 2014-12-26 MED ORDER — BUPIVACAINE HCL (PF) 0.25 % IJ SOLN
INTRAMUSCULAR | Status: AC
  Filled 2014-12-26: qty 30

## 2014-12-26 MED ORDER — FENTANYL CITRATE (PF) 100 MCG/2ML IJ SOLN: 25.0000 ug | INTRAMUSCULAR | Status: DC | PRN

## 2014-12-26 MED ORDER — LIDOCAINE HCL (CARDIAC) 20 MG/ML IV SOLN
INTRAVENOUS | Status: DC | PRN
  Administered 2014-12-26: 60 mg via INTRAVENOUS

## 2014-12-26 MED ORDER — DEXAMETHASONE SODIUM PHOSPHATE 4 MG/ML IJ SOLN
INTRAMUSCULAR | Status: DC | PRN
  Administered 2014-12-26: 10 mg via INTRAVENOUS

## 2014-12-26 MED ORDER — PROPOFOL 500 MG/50ML IV EMUL
INTRAVENOUS | Status: AC
  Filled 2014-12-26: qty 50

## 2014-12-26 MED ORDER — ONDANSETRON HCL 4 MG/2ML IJ SOLN
INTRAMUSCULAR | Status: DC | PRN
  Administered 2014-12-26: 4 mg via INTRAVENOUS

## 2014-12-26 MED ORDER — VANCOMYCIN HCL 10 G IV SOLR
1500.0000 mg | INTRAVENOUS | Status: AC
  Administered 2014-12-26: 1500 mg via INTRAVENOUS

## 2014-12-26 MED ORDER — HEPARIN SOD (PORK) LOCK FLUSH 100 UNIT/ML IV SOLN
INTRAVENOUS | Status: DC | PRN
  Administered 2014-12-26: 500 [IU] via INTRAVENOUS

## 2014-12-26 MED ORDER — ONDANSETRON 8 MG PO TBDP
ORAL_TABLET | ORAL | Status: AC
  Filled 2014-12-26: qty 1

## 2014-12-26 MED ORDER — HEPARIN (PORCINE) IN NACL 2-0.9 UNIT/ML-% IJ SOLN
INTRAMUSCULAR | Status: AC
  Filled 2014-12-26: qty 500

## 2014-12-26 MED ORDER — ONDANSETRON HCL 8 MG PO TABS: 8.0000 mg | ORAL_TABLET | Freq: Once | ORAL | Status: DC

## 2014-12-26 MED ORDER — FENTANYL CITRATE (PF) 100 MCG/2ML IJ SOLN
INTRAMUSCULAR | Status: DC | PRN
  Administered 2014-12-26 (×2): 50 ug via INTRAVENOUS

## 2014-12-26 MED ORDER — FENTANYL CITRATE (PF) 100 MCG/2ML IJ SOLN: 50.0000 ug | INTRAMUSCULAR | Status: DC | PRN

## 2014-12-26 MED ORDER — PROPOFOL 10 MG/ML IV BOLUS
INTRAVENOUS | Status: DC | PRN
Start: 2014-12-26 — End: 2014-12-26
  Administered 2014-12-26: 150 mg via INTRAVENOUS

## 2014-12-26 MED ORDER — CHLORHEXIDINE GLUCONATE 4 % EX LIQD: 1.0000 "application " | Freq: Once | CUTANEOUS | Status: DC

## 2014-12-26 MED ORDER — BUPIVACAINE-EPINEPHRINE (PF) 0.25% -1:200000 IJ SOLN
INTRAMUSCULAR | Status: AC
  Filled 2014-12-26: qty 30

## 2014-12-26 MED ORDER — ONDANSETRON 8 MG PO TBDP
8.0000 mg | ORAL_TABLET | Freq: Once | ORAL | Status: AC
  Administered 2014-12-26: 8 mg via ORAL

## 2014-12-26 MED ORDER — LACTATED RINGERS IV SOLN
INTRAVENOUS | Status: DC
  Administered 2014-12-26 (×2): via INTRAVENOUS

## 2014-12-26 MED ORDER — VANCOMYCIN HCL IN DEXTROSE 1-5 GM/200ML-% IV SOLN
INTRAVENOUS | Status: AC
  Filled 2014-12-26: qty 200

## 2014-12-26 MED ORDER — HEPARIN (PORCINE) IN NACL 2-0.9 UNIT/ML-% IJ SOLN
INTRAMUSCULAR | Status: DC | PRN
  Administered 2014-12-26: 7 mL via INTRAVENOUS

## 2014-12-26 MED ORDER — MIDAZOLAM HCL 2 MG/2ML IJ SOLN
1.0000 mg | INTRAMUSCULAR | Status: DC | PRN
Start: 2014-12-26 — End: 2014-12-26

## 2014-12-26 MED ORDER — FENTANYL CITRATE (PF) 100 MCG/2ML IJ SOLN
INTRAMUSCULAR | Status: AC
  Filled 2014-12-26: qty 6

## 2014-12-26 SURGICAL SUPPLY — 51 items
BAG DECANTER FOR FLEXI CONT (MISCELLANEOUS) ×3 IMPLANT
BLADE SURG 15 STRL LF DISP TIS (BLADE) ×1 IMPLANT
BLADE SURG 15 STRL SS (BLADE) ×3
CANISTER SUCT 1200ML W/VALVE (MISCELLANEOUS) IMPLANT
CHLORAPREP W/TINT 26ML (MISCELLANEOUS) ×3 IMPLANT
CLEANER CAUTERY TIP 5X5 PAD (MISCELLANEOUS) ×1 IMPLANT
COVER BACK TABLE 60X90IN (DRAPES) ×3 IMPLANT
COVER MAYO STAND STRL (DRAPES) ×3 IMPLANT
DECANTER SPIKE VIAL GLASS SM (MISCELLANEOUS) IMPLANT
DRAPE C-ARM 42X72 X-RAY (DRAPES) ×3 IMPLANT
DRAPE LAPAROSCOPIC ABDOMINAL (DRAPES) ×3 IMPLANT
DRAPE UTILITY XL STRL (DRAPES) ×3 IMPLANT
ELECT REM PT RETURN 9FT ADLT (ELECTROSURGICAL) ×3
ELECTRODE REM PT RTRN 9FT ADLT (ELECTROSURGICAL) ×1 IMPLANT
GLOVE BIO SURGEON STRL SZ 6.5 (GLOVE) ×1 IMPLANT
GLOVE BIO SURGEON STRL SZ7 (GLOVE) ×2 IMPLANT
GLOVE BIO SURGEON STRL SZ7.5 (GLOVE) ×3 IMPLANT
GLOVE BIO SURGEONS STRL SZ 6.5 (GLOVE) ×1
GLOVE BIOGEL PI IND STRL 7.0 (GLOVE) IMPLANT
GLOVE BIOGEL PI INDICATOR 7.0 (GLOVE) ×2
GOWN STRL REUS W/ TWL LRG LVL3 (GOWN DISPOSABLE) ×2 IMPLANT
GOWN STRL REUS W/ TWL XL LVL3 (GOWN DISPOSABLE) IMPLANT
GOWN STRL REUS W/TWL LRG LVL3 (GOWN DISPOSABLE) ×6
GOWN STRL REUS W/TWL XL LVL3 (GOWN DISPOSABLE) ×3
IV KIT MINILOC 20X1 SAFETY (NEEDLE) IMPLANT
KIT PORT POWER 8FR ISP CVUE (Catheter) ×2 IMPLANT
LIQUID BAND (GAUZE/BANDAGES/DRESSINGS) ×3 IMPLANT
NDL HYPO 25X1 1.5 SAFETY (NEEDLE) ×1 IMPLANT
NDL SAFETY ECLIPSE 18X1.5 (NEEDLE) IMPLANT
NDL SPNL 22GX3.5 QUINCKE BK (NEEDLE) IMPLANT
NEEDLE HYPO 18GX1.5 SHARP (NEEDLE)
NEEDLE HYPO 22GX1.5 SAFETY (NEEDLE) IMPLANT
NEEDLE HYPO 25X1 1.5 SAFETY (NEEDLE) ×3 IMPLANT
NEEDLE SPNL 22GX3.5 QUINCKE BK (NEEDLE) IMPLANT
PACK BASIN DAY SURGERY FS (CUSTOM PROCEDURE TRAY) ×3 IMPLANT
PAD CLEANER CAUTERY TIP 5X5 (MISCELLANEOUS) ×2
PENCIL BUTTON HOLSTER BLD 10FT (ELECTRODE) ×3 IMPLANT
SLEEVE SCD COMPRESS KNEE MED (MISCELLANEOUS) ×2 IMPLANT
SUT MON AB 4-0 PC3 18 (SUTURE) ×3 IMPLANT
SUT PROLENE 2 0 SH DA (SUTURE) ×3 IMPLANT
SUT SILK 2 0 TIES 17X18 (SUTURE)
SUT SILK 2-0 18XBRD TIE BLK (SUTURE) IMPLANT
SUT VIC AB 3-0 SH 27 (SUTURE) ×3
SUT VIC AB 3-0 SH 27X BRD (SUTURE) ×1 IMPLANT
SYR 5ML LL (SYRINGE) ×3 IMPLANT
SYR CONTROL 10ML LL (SYRINGE) ×6 IMPLANT
TOWEL OR 17X24 6PK STRL BLUE (TOWEL DISPOSABLE) ×6 IMPLANT
TOWEL OR NON WOVEN STRL DISP B (DISPOSABLE) ×3 IMPLANT
TUBE CONNECTING 20'X1/4 (TUBING)
TUBE CONNECTING 20X1/4 (TUBING) IMPLANT
YANKAUER SUCT BULB TIP NO VENT (SUCTIONS) IMPLANT

## 2014-12-26 NOTE — Anesthesia Postprocedure Evaluation (Signed)
  Anesthesia Post-op Note  Patient: Savannah Howard  Procedure(s) Performed: Procedure(s): INSERTION PORT-A-CATH (Right)  Patient Location: PACU  Anesthesia Type: General   Level of Consciousness: awake, alert  and oriented  Airway and Oxygen Therapy: Patient Spontanous Breathing  Post-op Pain: none  Post-op Assessment: Post-op Vital signs reviewed  Post-op Vital Signs: Reviewed  Last Vitals:  Filed Vitals:   12/26/14 1045  BP: 164/70  Pulse: 59  Temp: 36.9 C  Resp: 16    Complications: No apparent anesthesia complications

## 2014-12-26 NOTE — Op Note (Signed)
12/26/2014  9:46 AM  PATIENT:  Savannah Howard  69 y.o. female  PRE-OPERATIVE DIAGNOSIS:  Left Breast Cancer  POST-OPERATIVE DIAGNOSIS:  Left Breast Cancer  PROCEDURE:  Procedure(s): INSERTION PORT-A-CATH (Right)  SURGEON:  Surgeon(s) and Role:    * Jovita Kussmaul, MD - Primary  PHYSICIAN ASSISTANT:   ASSISTANTS: none   ANESTHESIA:   general  EBL:  Total I/O In: 1400 [I.V.:1400] Out: -   BLOOD ADMINISTERED:none  DRAINS: none   LOCAL MEDICATIONS USED:  MARCAINE     SPECIMEN:  No Specimen  DISPOSITION OF SPECIMEN:  N/A  COUNTS:  YES  TOURNIQUET:  * No tourniquets in log *  DICTATION: .Dragon Dictation  After informed consent was obtained the patient was brought to the operating room and placed in the supine position on the operating room table. After adequate induction of general anesthesia a roll was placed between the patient's shoulder blades to extend the shoulder slightly. The patient's right neck and chest area were then prepped with ChloraPrep, allowed to dry, and draped in usual sterile manner. The patient was placed in Trendelenburg position. A small incision was made lateral to the bend of the clavicle on the right chest with a 15 blade knife. A large bore needle from the Port-A-Cath kit was used to slide beneath the bend of the clavicle heading towards the sternal notch. Unfortunately we were not able to find the right subclavian vein. I then moved to the right neck. I was able to stick lateral to the pulse in the carotid and identified the right internal jugular vein. I did stick the carotid 1 time and held pressure for several minutes. I was unable to access the right internal jugular vein with the large bore needle from the Port-A-Cath kit. The wire was fed through the needle using the Seldinger technique without difficulty. The wire was confirmed in the central venous system using real-time fluoroscopy. Next a tunnel was created by blunt hemostat dissection between  the incision on the chest wall and the small incision on the neck. The tubing was brought through the tunnel. The tubing was then placed on the reservoir. A subcutaneous pocket was created inferior to the incision on the chest wall by blunt finger dissection. The reservoir was placed in the pocket and the length of the tubing was estimated using real-time fluoroscopy. The tubing was then cut to the appropriate length. Next the sheath and dilator were fed over the wire using the Seldinger technique without difficulty. The dilator and wire were removed from the patient. The tubing was fed through the sheath as far as it would go and held in place while the sheath was gently cracked and separated. Another real-time fluoroscopy image showed the tip of the catheter to be in the distal superior vena cava. The tubing was then permanently anchored to the reservoir. The reservoir was anchored in the pocket with 2 2-0 Prolene stitches. The report was then aspirated and it aspirated blood easily. It was then flushed initially with a dilute heparin solution and then with a more concentrated heparin solution. The incision on the neck was closed with an interrupted 4-0 Monocryl subcuticular stitch. The incision on the chest wall was closed with a deep layer of 3-0 Vicryl stitches and the skin was closed with a running 4-0 Monocryl subcuticular stitch. Dermabond dressings were applied. The patient tolerated the procedure well. At the end of the case all needle sponge and instrument counts were correct. The patient was then  awakened and taken to recovery in stable condition.  PLAN OF CARE: Discharge to home after PACU  PATIENT DISPOSITION:  PACU - hemodynamically stable.   Delay start of Pharmacological VTE agent (>24hrs) due to surgical blood loss or risk of bleeding: not applicable

## 2014-12-26 NOTE — Anesthesia Preprocedure Evaluation (Addendum)
Anesthesia Evaluation  Patient identified by MRN, date of birth, ID band Patient awake    Reviewed: Allergy & Precautions, NPO status   Airway Mallampati: II   Neck ROM: Full  Mouth opening: Limited Mouth Opening  Dental  (+) Teeth Intact   Pulmonary neg pulmonary ROS,  breath sounds clear to auscultation        Cardiovascular hypertension, Pt. on home beta blockers Rhythm:Regular Rate:Normal     Neuro/Psych PSYCHIATRIC DISORDERS Anxiety negative neurological ROS     GI/Hepatic GERD-  Medicated,  Endo/Other  Morbid obesity  Renal/GU      Musculoskeletal   Abdominal (+) + obese,   Peds  Hematology   Anesthesia Other Findings   Reproductive/Obstetrics                            Anesthesia Physical Anesthesia Plan  ASA: II  Anesthesia Plan: General   Post-op Pain Management:    Induction: Intravenous  Airway Management Planned: LMA  Additional Equipment:   Intra-op Plan:   Post-operative Plan: Extubation in OR  Informed Consent: I have reviewed the patients History and Physical, chart, labs and discussed the procedure including the risks, benefits and alternatives for the proposed anesthesia with the patient or authorized representative who has indicated his/her understanding and acceptance.     Plan Discussed with:   Anesthesia Plan Comments:         Anesthesia Quick Evaluation

## 2014-12-26 NOTE — Discharge Instructions (Signed)

## 2014-12-26 NOTE — Transfer of Care (Signed)
Immediate Anesthesia Transfer of Care Note  Patient: Savannah Howard  Procedure(s) Performed: Procedure(s): INSERTION PORT-A-CATH (Right)  Patient Location: PACU  Anesthesia Type:General  Level of Consciousness: awake and patient cooperative  Airway & Oxygen Therapy: Patient Spontanous Breathing and Patient connected to face mask oxygen  Post-op Assessment: Report given to RN and Post -op Vital signs reviewed and stable  Post vital signs: Reviewed and stable  Last Vitals:  Filed Vitals:   12/26/14 0710  BP: 182/69  Pulse: 72  Temp: 37.2 C  Resp: 20    Complications: No apparent anesthesia complications

## 2014-12-26 NOTE — Interval H&P Note (Signed)
History and Physical Interval Note:  12/26/2014 6:58 AM  Savannah Howard  has presented today for surgery, with the diagnosis of Left Breast Cancer  The various methods of treatment have been discussed with the patient and family. After consideration of risks, benefits and other options for treatment, the patient has consented to  Procedure(s): INSERTION PORT-A-CATH (N/A) as a surgical intervention .  The patient's history has been reviewed, patient examined, no change in status, stable for surgery.  I have reviewed the patient's chart and labs.  Questions were answered to the patient's satisfaction.     TOTH III,Acquanetta Cabanilla S

## 2014-12-26 NOTE — Anesthesia Procedure Notes (Signed)
Procedure Name: LMA Insertion Date/Time: 12/26/2014 8:37 AM Performed by: Aleksei Goodlin D Pre-anesthesia Checklist: Patient identified, Emergency Drugs available, Suction available and Patient being monitored Patient Re-evaluated:Patient Re-evaluated prior to inductionOxygen Delivery Method: Circle System Utilized Preoxygenation: Pre-oxygenation with 100% oxygen Intubation Type: IV induction Ventilation: Mask ventilation without difficulty LMA: LMA inserted LMA Size: 4.0 Number of attempts: 1 Airway Equipment and Method: Bite block Placement Confirmation: positive ETCO2 Tube secured with: Tape Dental Injury: Teeth and Oropharynx as per pre-operative assessment

## 2014-12-26 NOTE — H&P (Signed)
Savannah Howard 12/14/2014 8:36 AM Location: [facility] Patient #: [id number] DOB: January 20, 1946 Single / Language: Cleophus Molt / Race: White Female  History of Present Illness Sammuel Hines. Marlou Starks MD; 12/14/2014 9:23 AM) Patient words: left breast cancer.  The patient is a 69 year old female who presents with breast cancer. We are asked to see the patient in consultation by Dr. Autumn Patty to evaluate her for a left-sided breast cancer. The patient is a 69 year old white female who recently felt a mass in the upper portion of the left breast about 3 or 4 months ago. She has not had a mammogram in about 8 years. She does have some soreness associated with it but no discharge from the nipple. The area measured 5.2 cm by ultrasound. This was biopsied and came back as an invasive ductal cancer but the tumor markers have not been reported yet. She has no other personal or family history of breast cancer. She does not take any hormone replacement.   Other Problems Ventura Sellers, CMA; 12/14/2014 8:37 AM) Back Pain Gastroesophageal Reflux Disease Heart murmur High blood pressure Hypercholesterolemia Kidney Stone Lump In Breast  Past Surgical History Ventura Sellers, [place]; 12/14/2014 8:37 AM) Breast Biopsy Bilateral. Gallbladder Surgery - Open  Diagnostic Studies History Ventura Sellers, [place]; 12/14/2014 8:37 AM) Colonoscopy 5-10 years ago Mammogram within last year Pap Smear >5 years ago  Allergies Ventura Sellers, CMA; 12/14/2014 8:37 AM) Penicillin G Pot in Dextrose *PENICILLINS* Sulfa 10 *OPHTHALMIC AGENTS*  Medication History Ventura Sellers, CMA; 12/14/2014 8:40 AM) Atorvastatin Calcium (10MG  Tablet, Oral) Active. Cholecalciferol (10000UNIT Capsule, Oral) Active. Cyanocobalamin (1000MCG/ML Liquid, Oral) Active. Folic Acid (1MG  Tablet, Oral) Active. LORazepam (0.5MG  Tablet, Oral) Active. Losartan Potassium-HCTZ (100-25MG  Tablet, Oral) Active. Metoprolol  Succinate ER (50MG  Tablet ER 24HR, Oral) Active. Omeprazole (20MG  Capsule DR, Oral) Active. Potassium Chloride ER (10MEQ Capsule ER, Oral) Active. Terazosin HCl (5MG  Capsule, Oral) Active.  Social History Ventura Sellers, [place]; 12/14/2014 8:37 AM) Caffeine use Carbonated beverages, Tea. No alcohol use No drug use Tobacco use Never smoker.  Family History Ventura Sellers, [place]; 12/14/2014 8:37 AM) Heart Disease Mother. Heart disease in female family member before age 9 Hypertension Mother.  Pregnancy / Birth History Ventura Sellers, [place]; 12/14/2014 8:37 AM) Age at menarche 43 years. Age of menopause 68-50 Gravida 0 Para 0 Regular periods  Review of Systems Sharyn Lull R. Brooks CMA; 12/14/2014 8:37 AM) General Not Present- Appetite Loss, Chills, Fatigue, Fever, Night Sweats, Weight Gain and Weight Loss. Skin Not Present- Change in Wart/Mole, Dryness, Hives, Jaundice, New Lesions, Non-Healing Wounds, Rash and Ulcer. HEENT Present- Hoarseness and Wears glasses/contact lenses. Not Present- Earache, Hearing Loss, Nose Bleed, Oral Ulcers, Ringing in the Ears, Seasonal Allergies, Sinus Pain, Sore Throat, Visual Disturbances and Yellow Eyes. Respiratory Present- Snoring. Not Present- Bloody sputum, Chronic Cough, Difficulty Breathing and Wheezing. Breast Present- Breast Mass and Breast Pain. Not Present- Nipple Discharge and Skin Changes. Cardiovascular Present- Leg Cramps. Not Present- Chest Pain, Difficulty Breathing Lying Down, Palpitations, Rapid Heart Rate, Shortness of Breath and Swelling of Extremities. Gastrointestinal Present- Indigestion. Not Present- Abdominal Pain, Bloating, Bloody Stool, Change in Bowel Habits, Chronic diarrhea, Constipation, Difficulty Swallowing, Excessive gas, Gets full quickly at meals, Hemorrhoids, Nausea, Rectal Pain and Vomiting. Female Genitourinary Not Present- Frequency, Nocturia, Painful Urination, Pelvic Pain and Urgency. Musculoskeletal  Not Present- Back Pain, Joint Pain, Joint Stiffness, Muscle Pain, Muscle Weakness and Swelling of Extremities. Neurological Present- Tremor. Not Present- Decreased Memory, Fainting, Headaches, Numbness,  Seizures, Tingling, Trouble walking and Weakness. Psychiatric Not Present- Anxiety, Bipolar, Change in Sleep Pattern, Depression, Fearful and Frequent crying. Endocrine Not Present- Cold Intolerance, Excessive Hunger, Hair Changes, Heat Intolerance, Hot flashes and New Diabetes. Hematology Not Present- Easy Bruising, Excessive bleeding, Gland problems, HIV and Persistent Infections.   Vitals [employer] R. Brooks CMA; 12/14/2014 8:37 AM) 12/14/2014 8:36 AM Weight: 230 lb Height: 62in Body Surface Area: 2.14 m Body Mass Index: 42.07 kg/m BP: 142/86 (Sitting, Left Arm, Standard)    Physical Exam Eddie Dibbles S. Marlou Starks MD; 12/14/2014 9:24 AM) General Mental Status-Alert. General Appearance-Consistent with stated age. Hydration-Well hydrated. Voice-Normal.  Head and Neck Head-normocephalic, atraumatic with no lesions or palpable masses. Trachea-midline. Thyroid Gland Characteristics - normal size and consistency.  Eye Eyeball - Bilateral-Extraocular movements intact. Sclera/Conjunctiva - Bilateral-No scleral icterus.  Chest and Lung Exam Chest and lung exam reveals -quiet, even and easy respiratory effort with no use of accessory muscles and on auscultation, normal breath sounds, no adventitious sounds and normal vocal resonance. Inspection Chest Wall - Normal. Back - normal.  Breast Note: There is a large mass in the central and upper outer portions of the left breast. The mass appears tethered to the skin and there is a rash around the areolar that may be related to the tumor. It does not appear to be tethered to the muscle of the chest wall. There is no palpable mass in the right breast. There is no palpable axillary, supraclavicular, or cervical  lymphadenopathy.   Cardiovascular Cardiovascular examination reveals -normal heart sounds, regular rate and rhythm with no murmurs and normal pedal pulses bilaterally.  Abdomen Inspection Inspection of the abdomen reveals - No Hernias. Skin - Scar - no surgical scars. Palpation/Percussion Palpation and Percussion of the abdomen reveal - Soft, Non Tender, No Rebound tenderness, No Rigidity (guarding) and No hepatosplenomegaly. Auscultation Auscultation of the abdomen reveals - Bowel sounds normal.  Neurologic Neurologic evaluation reveals -alert and oriented x 3 with no impairment of recent or remote memory. Mental Status-Normal.  Musculoskeletal Normal Exam - Left-Upper Extremity Strength Normal and Lower Extremity Strength Normal. Normal Exam - Right-Upper Extremity Strength Normal and Lower Extremity Strength Normal.  Lymphatic Head & Neck  General Head & Neck Lymphatics: Bilateral - Description - Normal. Axillary  General Axillary Region: Bilateral - Description - Normal. Tenderness - Non Tender. Femoral & Inguinal  Generalized Femoral & Inguinal Lymphatics: Bilateral - Description - Normal. Tenderness - Non Tender.    Assessment & Plan Eddie Dibbles S. Marlou Starks MD; 12/14/2014 9:21 AM) PRIMARY CANCER OF UPPER OUTER QUADRANT OF LEFT FEMALE BREAST (174.4  C50.412) Impression: The patient appears to have a large cancer in the upper outer left breast with clinically negative lymph nodes. I have talked her in detail about the different options for treatment and at this point I think she should consider neoadjuvant therapy. We will make her referral to the medical oncologist to consider this. If she chooses to have surgery up front and she would require a mastectomy and sentinel node mapping. I have also talked to her about the potential need for a Port-A-Cath. I have discussed with her in detail the risks and benefits of this operation as well as some of the technical aspects and she  understands and wishes to proceed. I will wait to hear back from the medical oncologists and then proceed accordingly Current Plans  Referred to Oncology, for evaluation and follow up (Oncology). Follow up in 1 month or as needed   Signed by  Luella Cook, MD (12/14/2014 9:24 AM)

## 2014-12-27 ENCOUNTER — Encounter (HOSPITAL_BASED_OUTPATIENT_CLINIC_OR_DEPARTMENT_OTHER): Payer: Self-pay | Admitting: General Surgery

## 2014-12-27 ENCOUNTER — Ambulatory Visit (HOSPITAL_COMMUNITY)
Admission: RE | Admit: 2014-12-27 | Discharge: 2014-12-27 | Disposition: A | Discharge status: Home / Self Care | Payer: Managed Care, Other (non HMO) | Source: Ambulatory Visit | Attending: Hematology and Oncology | Admitting: Hematology and Oncology

## 2014-12-27 ENCOUNTER — Other Ambulatory Visit: Payer: Managed Care, Other (non HMO)

## 2014-12-27 ENCOUNTER — Other Ambulatory Visit: Payer: Self-pay | Admitting: *Deleted

## 2014-12-27 DIAGNOSIS — C50412 Malignant neoplasm of upper-outer quadrant of left female breast: Secondary | ICD-10-CM | POA: Diagnosis not present

## 2014-12-27 DIAGNOSIS — I10 Essential (primary) hypertension: Secondary | ICD-10-CM | POA: Insufficient documentation

## 2014-12-27 DIAGNOSIS — C50919 Malignant neoplasm of unspecified site of unspecified female breast: Secondary | ICD-10-CM | POA: Insufficient documentation

## 2014-12-27 DIAGNOSIS — E785 Hyperlipidemia, unspecified: Secondary | ICD-10-CM | POA: Insufficient documentation

## 2014-12-27 NOTE — Progress Notes (Signed)
  Echocardiogram 2D Echocardiogram has been performed.  Ruthellen Tippy FRANCES 12/27/2014, 12:33 PM

## 2014-12-28 ENCOUNTER — Telehealth: Payer: Self-pay

## 2014-12-28 ENCOUNTER — Other Ambulatory Visit (HOSPITAL_COMMUNITY): Payer: Managed Care, Other (non HMO)

## 2014-12-28 NOTE — Telephone Encounter (Signed)
Appt made for MRI Tuesday 24th at 930 am.  Order faxed to [facility].  [facility] - notified of d/t and location of appt.  Sent to scan.   Per Dr. Lindi Adie, ok to proceed with chemo on Monday.

## 2014-12-30 NOTE — Assessment & Plan Note (Signed)
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 Neoadjuvant chemotherapy with TCH Perjeta 6 cycles followed by Herceptin maintenance 2. After initial chemotherapy repeat MRI and then surgery 3. Adjuvant radiation therapy may be a consideration 4. Followed by antiestrogen therapy  Current Plan: TCH-Perjeta cycle 1 Day 1  Monitoring closely for toxicities ECHO 12/27/14: EF 60-65% MRI breast scheduled for 01/01/15  RTC 1 week for tox check

## 2014-12-31 ENCOUNTER — Other Ambulatory Visit (HOSPITAL_BASED_OUTPATIENT_CLINIC_OR_DEPARTMENT_OTHER): Payer: Managed Care, Other (non HMO)

## 2014-12-31 ENCOUNTER — Encounter: Payer: Self-pay | Admitting: *Deleted

## 2014-12-31 ENCOUNTER — Ambulatory Visit (HOSPITAL_COMMUNITY)
Admission: RE | Admit: 2014-12-31 | Discharge: 2014-12-31 | Disposition: A | Discharge status: Home / Self Care | Payer: Managed Care, Other (non HMO) | Source: Ambulatory Visit | Attending: Hematology and Oncology | Admitting: Hematology and Oncology

## 2014-12-31 ENCOUNTER — Ambulatory Visit (HOSPITAL_BASED_OUTPATIENT_CLINIC_OR_DEPARTMENT_OTHER): Payer: Managed Care, Other (non HMO)

## 2014-12-31 ENCOUNTER — Ambulatory Visit (HOSPITAL_BASED_OUTPATIENT_CLINIC_OR_DEPARTMENT_OTHER): Payer: Managed Care, Other (non HMO) | Admitting: Hematology and Oncology

## 2014-12-31 VITALS — BP 164/57 | HR 55 | Temp 98.2°F | Resp 18

## 2014-12-31 VITALS — BP 168/56 | HR 96 | Temp 99.0°F | Resp 17 | Ht 62.0 in | Wt 230.0 lb

## 2014-12-31 DIAGNOSIS — C50412 Malignant neoplasm of upper-outer quadrant of left female breast: Secondary | ICD-10-CM

## 2014-12-31 DIAGNOSIS — Z452 Encounter for adjustment and management of vascular access device: Secondary | ICD-10-CM | POA: Insufficient documentation

## 2014-12-31 DIAGNOSIS — Z170 Estrogen receptor positive status [ER+]: Secondary | ICD-10-CM

## 2014-12-31 DIAGNOSIS — Z5112 Encounter for antineoplastic immunotherapy: Secondary | ICD-10-CM

## 2014-12-31 DIAGNOSIS — Z5111 Encounter for antineoplastic chemotherapy: Secondary | ICD-10-CM | POA: Diagnosis not present

## 2014-12-31 LAB — CBC WITH DIFFERENTIAL/PLATELET
BASO%: 0 % (ref 0.0–2.0)
Basophils Absolute: 0 10*3/uL (ref 0.0–0.1)
EOS%: 0 % (ref 0.0–7.0)
Eosinophils Absolute: 0 10*3/uL (ref 0.0–0.5)
HCT: 40.7 % (ref 34.8–46.6)
HGB: 13.9 g/dL (ref 11.6–15.9)
LYMPH%: 16.9 % (ref 14.0–49.7)
MCH: 31.6 pg (ref 25.1–34.0)
MCHC: 34.2 g/dL (ref 31.5–36.0)
MCV: 92.5 fL (ref 79.5–101.0)
MONO#: 0.1 10*3/uL (ref 0.1–0.9)
MONO%: 1.8 % (ref 0.0–14.0)
NEUT#: 5.3 10*3/uL (ref 1.5–6.5)
NEUT%: 81.3 % — AB (ref 38.4–76.8)
NRBC: 0 % (ref 0–0)
PLATELETS: 229 10*3/uL (ref 145–400)
RBC: 4.4 10*6/uL (ref 3.70–5.45)
RDW: 14 % (ref 11.2–14.5)
WBC: 6.5 10*3/uL (ref 3.9–10.3)
lymph#: 1.1 10*3/uL (ref 0.9–3.3)

## 2014-12-31 LAB — COMPREHENSIVE METABOLIC PANEL (CC13)
ALT: 14 U/L (ref 0–55)
ANION GAP: 18 meq/L — AB (ref 3–11)
AST: 9 U/L (ref 5–34)
Albumin: 3.8 g/dL (ref 3.5–5.0)
Alkaline Phosphatase: 63 U/L (ref 40–150)
BILIRUBIN TOTAL: 0.44 mg/dL (ref 0.20–1.20)
BUN: 20 mg/dL (ref 7.0–26.0)
CO2: 19 mEq/L — ABNORMAL LOW (ref 22–29)
Calcium: 9.6 mg/dL (ref 8.4–10.4)
Chloride: 105 mEq/L (ref 98–109)
Creatinine: 1 mg/dL (ref 0.6–1.1)
EGFR: 55 mL/min/{1.73_m2} — ABNORMAL LOW (ref >90)
Glucose: 208 mg/dl — ABNORMAL HIGH (ref 70–140)
POTASSIUM: 4.1 meq/L (ref 3.5–5.1)
Sodium: 142 mEq/L (ref 136–145)
TOTAL PROTEIN: 7.4 g/dL (ref 6.4–8.3)

## 2014-12-31 MED ORDER — SODIUM CHLORIDE 0.9 % IV SOLN
675.6000 mg | Freq: Once | INTRAVENOUS | Status: AC
  Administered 2014-12-31: 680 mg via INTRAVENOUS
  Filled 2014-12-31: qty 68

## 2014-12-31 MED ORDER — ACETAMINOPHEN 325 MG PO TABS
ORAL_TABLET | ORAL | Status: AC
  Filled 2014-12-31: qty 2

## 2014-12-31 MED ORDER — CLONIDINE HCL 0.1 MG PO TABS
0.2000 mg | ORAL_TABLET | Freq: Once | ORAL | Status: AC
  Administered 2014-12-31: 0.2 mg via ORAL

## 2014-12-31 MED ORDER — HEPARIN SOD (PORK) LOCK FLUSH 100 UNIT/ML IV SOLN
500.0000 [IU] | Freq: Once | INTRAVENOUS | Status: AC | PRN
  Administered 2014-12-31: 500 [IU]
  Filled 2014-12-31: qty 5

## 2014-12-31 MED ORDER — DIPHENHYDRAMINE HCL 25 MG PO CAPS
ORAL_CAPSULE | ORAL | Status: AC
  Filled 2014-12-31: qty 2

## 2014-12-31 MED ORDER — SODIUM CHLORIDE 0.9 % IV SOLN
Freq: Once | INTRAVENOUS | Status: AC
  Administered 2014-12-31: 12:00:00 via INTRAVENOUS
  Filled 2014-12-31: qty 1

## 2014-12-31 MED ORDER — SODIUM CHLORIDE 0.9 % IV SOLN
840.0000 mg | Freq: Once | INTRAVENOUS | Status: AC
  Administered 2014-12-31: 840 mg via INTRAVENOUS
  Filled 2014-12-31: qty 28

## 2014-12-31 MED ORDER — DOCETAXEL CHEMO INJECTION 160 MG/16ML
75.0000 mg/m2 | Freq: Once | INTRAVENOUS | Status: AC
  Administered 2014-12-31: 160 mg via INTRAVENOUS
  Filled 2014-12-31: qty 16

## 2014-12-31 MED ORDER — ACETAMINOPHEN 325 MG PO TABS
650.0000 mg | ORAL_TABLET | Freq: Once | ORAL | Status: AC
  Administered 2014-12-31: 650 mg via ORAL

## 2014-12-31 MED ORDER — PALONOSETRON HCL INJECTION 0.25 MG/5ML
INTRAVENOUS | Status: AC
  Filled 2014-12-31: qty 5

## 2014-12-31 MED ORDER — SODIUM CHLORIDE 0.9 % IJ SOLN
10.0000 mL | INTRAMUSCULAR | Status: DC | PRN
  Administered 2014-12-31: 10 mL
  Filled 2014-12-31: qty 10

## 2014-12-31 MED ORDER — SODIUM CHLORIDE 0.9 % IV SOLN
Freq: Once | INTRAVENOUS | Status: AC
  Administered 2014-12-31: 11:00:00 via INTRAVENOUS

## 2014-12-31 MED ORDER — PEGFILGRASTIM 6 MG/0.6ML ~~LOC~~ PSKT
6.0000 mg | PREFILLED_SYRINGE | Freq: Once | SUBCUTANEOUS | Status: AC
  Administered 2014-12-31: 6 mg via SUBCUTANEOUS
  Filled 2014-12-31: qty 0.6

## 2014-12-31 MED ORDER — SODIUM CHLORIDE 0.9 % IV SOLN
8.0000 mg/kg | Freq: Once | INTRAVENOUS | Status: AC
  Administered 2014-12-31: 840 mg via INTRAVENOUS
  Filled 2014-12-31: qty 40

## 2014-12-31 MED ORDER — CLONIDINE HCL 0.1 MG PO TABS
ORAL_TABLET | ORAL | Status: AC
  Filled 2014-12-31: qty 2

## 2014-12-31 MED ORDER — DIPHENHYDRAMINE HCL 25 MG PO CAPS
50.0000 mg | ORAL_CAPSULE | Freq: Once | ORAL | Status: AC
  Administered 2014-12-31: 50 mg via ORAL

## 2014-12-31 MED ORDER — PALONOSETRON HCL INJECTION 0.25 MG/5ML
0.2500 mg | Freq: Once | INTRAVENOUS | Status: AC
  Administered 2014-12-31: 0.25 mg via INTRAVENOUS

## 2014-12-31 NOTE — Progress Notes (Signed)
Patient Care Team: [name], MD as PCP - General  DIAGNOSIS: No matching staging information was found for the patient.  SUMMARY OF ONCOLOGIC HISTORY:   Breast cancer of upper-outer quadrant of left female breast   12/07/2014 Initial Diagnosis Left breast 2:00 position: Invasive ductal carcinoma grade 2, ER 5%, PR 0%, Ki-67 80%, HER-2 positive ratio 6.96   12/07/2014 Mammogram Large left breast mass 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, 2 small benign nodules in the right breast stable since 2002, other consistent with normal intramammary lymph node left axilla ultrasound normal-sized lymph nodes   12/31/2014 -  Neo-Adjuvant Chemotherapy TCH Perjeta neoadjuvant chemotherapy 6    CHIEF COMPLIANT: Neoadjuvant chemotherapy TCH Perjeta cycle 1  INTERVAL HISTORY: Savannah Howard is a 69 year old with above-mentioned history of left breast cancer currently on neoadjuvant chemotherapy with [place] where general after a cycle 1 of treatment. Her echocardiogram was normal. She scheduled to undergo breast MRI tomorrow. She underwent a port placement with the tip of the port is in the brachiocephalic vein. We are discussing with Dr. Marlou Starks regarding her port.   REVIEW OF SYSTEMS:   Constitutional: Denies fevers, chills or abnormal weight loss Eyes: Denies blurriness of vision Ears, nose, mouth, throat, and face: Denies mucositis or sore throat Respiratory: Denies cough, dyspnea or wheezes Cardiovascular: Denies palpitation, chest discomfort or lower extremity swelling Gastrointestinal:  Denies nausea, heartburn or change in bowel habits Skin: Denies abnormal skin rashes Lymphatics: Denies new lymphadenopathy or easy bruising Neurological:Denies numbness, tingling or new weaknesses Behavioral/Psych: Mood is stable, no new changes  Breast:  denies any pain or lumps or nodules in either breasts All other systems were reviewed with the patient and are negative.  I have reviewed the past medical history, past  surgical history, social history and family history with the patient and they are unchanged from previous note.  ALLERGIES:  is allergic to penicillins and sulfonamide derivatives.  MEDICATIONS:  Current Outpatient Prescriptions  Medication Sig Dispense Refill  . atorvastatin (LIPITOR) 10 MG tablet Take 1 tablet (10 mg total) by mouth daily. 90 tablet 3  . cholecalciferol (VITAMIN D) 1000 UNITS tablet Take 1,000 Units by mouth daily.      . cyanocobalamin (,VITAMIN B-12,) 1000 MCG/ML injection Inject 1 mL (1,000 mcg total) into the muscle every 30 (thirty) days. 3 mL 3  . dexamethasone (DECADRON) 4 MG tablet Take 1 tablet (4 mg total) by mouth 2 (two) times daily. Start the day before Taxotere. Then again the day after chemo for 3 days. 30 tablet 1  . folic acid (FOLVITE) 1 MG tablet Take 1 mg by mouth daily.      Marland Kitchen lidocaine-prilocaine (EMLA) cream Apply to affected area once 30 g 3  . LORazepam (ATIVAN) 0.5 MG tablet Take 1 tablet (0.5 mg total) by mouth 3 (three) times daily as needed. for anxiety 270 tablet 1  . losartan-hydrochlorothiazide (HYZAAR) 100-25 MG per tablet Take 1 tablet by mouth daily. 90 tablet 3  . metoprolol succinate (TOPROL-XL) 50 MG 24 hr tablet Take 1 tablet (50 mg total) by mouth 2 (two) times daily. Take with or immediately following a meal. 180 tablet 2  . omeprazole (PRILOSEC) 20 MG capsule TAKE ONE CAPSULE BY MOUTH ONCE DAILY 90 capsule 1  . ondansetron (ZOFRAN) 8 MG tablet Take 1 tablet (8 mg total) by mouth 2 (two) times daily. Start the day after chemo for 3 days. Then take as needed for nausea or vomiting. 30 tablet 1  .  oxyCODONE-acetaminophen (ROXICET) 5-325 MG per tablet Take 1-2 tablets by mouth every 4 (four) hours as needed. 50 tablet 0  . potassium chloride (MICRO-K) 10 MEQ CR capsule Take 1 capsule (10 mEq total) by mouth daily. 90 capsule 3  . prochlorperazine (COMPAZINE) 10 MG tablet Take 1 tablet (10 mg total) by mouth every 6 (six) hours as needed  (Nausea or vomiting). 30 tablet 1  . terazosin (HYTRIN) 5 MG capsule Take 1 capsule (5 mg total) by mouth at bedtime. 90 capsule 3   No current facility-administered medications for this visit.    PHYSICAL EXAMINATION: ECOG PERFORMANCE STATUS: 0 - Asymptomatic  Filed Vitals:   12/31/14 0921  BP: 168/56  Pulse: 96  Temp: 99 F (37.2 C)  Resp: 17   Filed Weights   12/31/14 0921  Weight: 230 lb (104.327 kg)    GENERAL:alert, no distress and comfortable SKIN: skin color, texture, turgor are normal, no rashes or significant lesions EYES: normal, Conjunctiva are pink and non-injected, sclera clear OROPHARYNX:no exudate, no erythema and lips, buccal mucosa, and tongue normal  NECK: supple, thyroid normal size, non-tender, without nodularity LYMPH:  no palpable lymphadenopathy in the cervical, axillary or inguinal LUNGS: clear to auscultation and percussion with normal breathing effort HEART: regular rate & rhythm and no murmurs and no lower extremity edema ABDOMEN:abdomen soft, non-tender and normal bowel sounds Musculoskeletal:no cyanosis of digits and no clubbing  NEURO: alert & oriented x 3 with fluent speech, no focal motor/sensory deficits  LABORATORY DATA:  I have reviewed the data as listed   Chemistry      Component Value Date/Time   NA 142 12/31/2014 0904   NA 138 12/26/2014 0755   K 4.1 12/31/2014 0904   K 5.7* 12/26/2014 0755   CL 102 12/26/2014 0755   CO2 19* 12/31/2014 0904   CO2 28 11/05/2014 1456   BUN 20.0 12/31/2014 0904   BUN 45* 12/26/2014 0755   CREATININE 1.0 12/31/2014 0904   CREATININE 1.00 12/26/2014 0755      Component Value Date/Time   CALCIUM 9.6 12/31/2014 0904   CALCIUM 9.8 11/05/2014 1456   ALKPHOS 63 12/31/2014 0904   ALKPHOS 68 11/05/2014 1456   AST 9 12/31/2014 0904   AST 15 11/05/2014 1456   ALT 14 12/31/2014 0904   ALT 14 11/05/2014 1456   BILITOT 0.44 12/31/2014 0904   BILITOT 0.4 11/05/2014 1456       Lab Results   Component Value Date   WBC 6.5 12/31/2014   HGB 13.9 12/31/2014   HCT 40.7 12/31/2014   MCV 92.5 12/31/2014   PLT 229 12/31/2014   NEUTROABS 5.3 12/31/2014    ASSESSMENT & PLAN:  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 Neoadjuvant chemotherapy with TCH Perjeta 6 cycles followed by Herceptin maintenance 2. After initial chemotherapy repeat MRI and then surgery 3. Adjuvant radiation therapy may be a consideration 4. Followed by antiestrogen therapy  Current Plan: TCH-Perjeta cycle 1 Day 1 If they cannot infuse entire chemotherapy with Herceptin for general today, we could even look at splitting her treatment to where she gets Herceptin Perjeta today and chemotherapy tomorrow  Monitoring closely for toxicities ECHO 12/27/14: EF 60-65% MRI breast scheduled for 01/01/15  Port issues: The  tip of the port is in the brachiocephalic vein. We will get another x-ray to identify its location. If it has come to the right location and then she would not need to be worked on 6 the port. We can then give her treatment through the port as scheduled. RTC 1 week for tox check  No orders of the defined types were placed in this encounter.   The patient has a good understanding of the overall plan. she agrees with it. she will call with any problems that may develop before the next visit here.   Rulon Eisenmenger, MD

## 2014-12-31 NOTE — Progress Notes (Signed)
Onpro device applied to left upper abdomen at at 1855.  Ms. Ashbaugh instructed not to get wet after 6:45 pm.  Injection will begin at 0955, end at 10:40.  Do not remove from abdomen until 11:40 pm.  If unit turns red.  Call [facility] and return device.  If unit does not beep, chime or fast blink it failed and also should be returned to [facility] to return to company.

## 2014-12-31 NOTE — Patient Instructions (Signed)
[facility] Discharge Instructions for Patients Receiving Chemotherapy  Today you received the following chemotherapy agents   To help prevent nausea and vomiting after your treatment, we encourage you to take your nausea medication    If you develop nausea and vomiting that is not controlled by your nausea medication, call the clinic.   BELOW ARE SYMPTOMS THAT SHOULD BE REPORTED IMMEDIATELY:  *FEVER GREATER THAN 100.5 F  *CHILLS WITH OR WITHOUT FEVER  NAUSEA AND VOMITING THAT IS NOT CONTROLLED WITH YOUR NAUSEA MEDICATION  *UNUSUAL SHORTNESS OF BREATH  *UNUSUAL BRUISING OR BLEEDING  TENDERNESS IN MOUTH AND THROAT WITH OR WITHOUT PRESENCE OF ULCERS  *URINARY PROBLEMS  *BOWEL PROBLEMS  UNUSUAL RASH Items with * indicate a potential emergency and should be followed up as soon as possible.  Feel free to call the clinic you have any questions or concerns. The clinic phone number is [phone].  Please show the CHEMO ALERT CARD at check-in to the Emergency Department and triage nurse.   

## 2014-12-31 NOTE — Progress Notes (Signed)
Selena Lesser, NP notified of patient's elevated BP of 192/63 and reports of slight headache and feeling tired.  Clonidine 0.2 mg PO order given verbally and okay to proceed with carboplatin.  Will continue to monitor patient's VS closely.

## 2015-01-01 ENCOUNTER — Ambulatory Visit
Admission: RE | Admit: 2015-01-01 | Discharge: 2015-01-01 | Disposition: A | Discharge status: Home / Self Care | Payer: Managed Care, Other (non HMO) | Source: Ambulatory Visit | Attending: Hematology and Oncology | Admitting: Hematology and Oncology

## 2015-01-01 ENCOUNTER — Telehealth: Payer: Self-pay | Admitting: *Deleted

## 2015-01-01 DIAGNOSIS — C50412 Malignant neoplasm of upper-outer quadrant of left female breast: Secondary | ICD-10-CM

## 2015-01-01 NOTE — Progress Notes (Unsigned)
Met with pt during first chemo treatment. Relate she is doing well. Denies needs at this time. Encourage pt to call with questions or concerns. Received verbal understanding.

## 2015-01-01 NOTE — Telephone Encounter (Signed)
Patient received 1st TCHP yesterday. States that her BP this morning is 136/66. States that she is eating and drinking well, denies any nausea, vomiting or diarrhea. Advised patient to call with any questions or concerns. She verbalized understanding.

## 2015-01-03 ENCOUNTER — Ambulatory Visit
Admission: RE | Admit: 2015-01-03 | Discharge: 2015-01-03 | Disposition: A | Discharge status: Home / Self Care | Payer: Managed Care, Other (non HMO) | Source: Ambulatory Visit | Attending: Hematology and Oncology | Admitting: Hematology and Oncology

## 2015-01-03 MED ORDER — GADOBENATE DIMEGLUMINE 529 MG/ML IV SOLN: 20.0000 mL | Freq: Once | INTRAVENOUS | Status: AC | PRN

## 2015-01-04 ENCOUNTER — Other Ambulatory Visit: Payer: Self-pay | Admitting: Geriatric Medicine

## 2015-01-04 MED ORDER — LOSARTAN POTASSIUM-HCTZ 100-25 MG PO TABS: 1.0000 | ORAL_TABLET | Freq: Every day | ORAL | Status: DC

## 2015-01-08 ENCOUNTER — Other Ambulatory Visit (HOSPITAL_BASED_OUTPATIENT_CLINIC_OR_DEPARTMENT_OTHER): Payer: Managed Care, Other (non HMO)

## 2015-01-08 ENCOUNTER — Encounter: Payer: Self-pay | Admitting: Nurse Practitioner

## 2015-01-08 ENCOUNTER — Telehealth: Payer: Self-pay | Admitting: Hematology and Oncology

## 2015-01-08 ENCOUNTER — Ambulatory Visit (HOSPITAL_BASED_OUTPATIENT_CLINIC_OR_DEPARTMENT_OTHER): Payer: Managed Care, Other (non HMO) | Admitting: Nurse Practitioner

## 2015-01-08 VITALS — BP 146/65 | HR 75 | Temp 98.1°F | Resp 18 | Ht 62.0 in | Wt 222.5 lb

## 2015-01-08 DIAGNOSIS — K521 Toxic gastroenteritis and colitis: Secondary | ICD-10-CM

## 2015-01-08 DIAGNOSIS — T451X5A Adverse effect of antineoplastic and immunosuppressive drugs, initial encounter: Secondary | ICD-10-CM

## 2015-01-08 DIAGNOSIS — C50412 Malignant neoplasm of upper-outer quadrant of left female breast: Secondary | ICD-10-CM | POA: Diagnosis not present

## 2015-01-08 DIAGNOSIS — K1231 Oral mucositis (ulcerative) due to antineoplastic therapy: Secondary | ICD-10-CM

## 2015-01-08 DIAGNOSIS — R530 Neoplastic (malignant) related fatigue: Secondary | ICD-10-CM

## 2015-01-08 DIAGNOSIS — R110 Nausea: Secondary | ICD-10-CM

## 2015-01-08 DIAGNOSIS — R197 Diarrhea, unspecified: Secondary | ICD-10-CM

## 2015-01-08 DIAGNOSIS — R030 Elevated blood-pressure reading, without diagnosis of hypertension: Secondary | ICD-10-CM

## 2015-01-08 HISTORY — DX: Toxic gastroenteritis and colitis: K52.1

## 2015-01-08 HISTORY — DX: Adverse effect of antineoplastic and immunosuppressive drugs, initial encounter: T45.1X5A

## 2015-01-08 HISTORY — DX: Oral mucositis (ulcerative) due to antineoplastic therapy: K12.31

## 2015-01-08 LAB — CBC WITH DIFFERENTIAL/PLATELET
BASO%: 0.4 % (ref 0.0–2.0)
Basophils Absolute: 0 10*3/uL (ref 0.0–0.1)
EOS ABS: 0 10*3/uL (ref 0.0–0.5)
EOS%: 0.5 % (ref 0.0–7.0)
HCT: 36.2 % (ref 34.8–46.6)
HGB: 12.5 g/dL (ref 11.6–15.9)
LYMPH#: 1.3 10*3/uL (ref 0.9–3.3)
LYMPH%: 22.2 % (ref 14.0–49.7)
MCH: 31.4 pg (ref 25.1–34.0)
MCHC: 34.7 g/dL (ref 31.5–36.0)
MCV: 90.5 fL (ref 79.5–101.0)
MONO#: 1 10*3/uL — AB (ref 0.1–0.9)
MONO%: 16.7 % — ABNORMAL HIGH (ref 0.0–14.0)
NEUT%: 60.2 % (ref 38.4–76.8)
NEUTROS ABS: 3.6 10*3/uL (ref 1.5–6.5)
Platelets: 168 10*3/uL (ref 145–400)
RBC: 4 10*6/uL (ref 3.70–5.45)
RDW: 14.2 % (ref 11.2–14.5)
WBC: 6 10*3/uL (ref 3.9–10.3)

## 2015-01-08 LAB — COMPREHENSIVE METABOLIC PANEL (CC13)
ALBUMIN: 3.1 g/dL — AB (ref 3.5–5.0)
ALT: 22 U/L (ref 0–55)
AST: 16 U/L (ref 5–34)
Alkaline Phosphatase: 62 U/L (ref 40–150)
Anion Gap: 16 mEq/L — ABNORMAL HIGH (ref 3–11)
BUN: 23.2 mg/dL (ref 7.0–26.0)
CALCIUM: 9 mg/dL (ref 8.4–10.4)
CHLORIDE: 93 meq/L — AB (ref 98–109)
CO2: 21 mEq/L — ABNORMAL LOW (ref 22–29)
Creatinine: 1.1 mg/dL (ref 0.6–1.1)
EGFR: 49 mL/min/{1.73_m2} — ABNORMAL LOW (ref >90)
Glucose: 153 mg/dl — ABNORMAL HIGH (ref 70–140)
POTASSIUM: 3.6 meq/L (ref 3.5–5.1)
SODIUM: 130 meq/L — AB (ref 136–145)
Total Bilirubin: 0.65 mg/dL (ref 0.20–1.20)
Total Protein: 7.5 g/dL (ref 6.4–8.3)

## 2015-01-08 MED ORDER — CHOLESTYRAMINE 4 G PO PACK: 4.0000 g | PACK | Freq: Three times a day (TID) | ORAL | Status: DC

## 2015-01-08 NOTE — Progress Notes (Signed)
Patient Care Team: [name], MD as PCP - General  DIAGNOSIS: No matching staging information was found for the patient.  SUMMARY OF ONCOLOGIC HISTORY:   Breast cancer of upper-outer quadrant of left female breast   12/07/2014 Initial Diagnosis Left breast 2:00 position: Invasive ductal carcinoma grade 2, ER 5%, PR 0%, Ki-67 80%, HER-2 positive ratio 6.96   12/07/2014 Mammogram Large left breast mass 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, 2 small benign nodules in the right breast stable since 2002, other consistent with normal intramammary lymph node left axilla ultrasound normal-sized lymph nodes   12/31/2014 -  Neo-Adjuvant Chemotherapy TCH Perjeta neoadjuvant chemotherapy 6    CHIEF COMPLIANT: Neoadjuvant chemotherapy TCH Perjeta cycle 1  INTERVAL HISTORY: Savannah Howard is a 69 year old with above-mentioned history of left breast cancer currently on neoadjuvant chemotherapy with TCH. Today is day 9, cycle 1 of treatment. During the administration of one of the drugs, her blood pressure "skyrocketed" and she had to be given 0.65m clonidine to bring it back down. JLillyaunadenies fevers or chills. She had some nausea and dry heaves, but was only taking zofran for this instead adding in the compazine as well. She has had diarrhea since day 3, about 5-6 times daily. She is using imodium, but it is minimally effective. Her throat is raw and she is having difficulty swallowing. Her appetite was poor and she has lost over 7lb since last week. She is drinking enough fluids.   REVIEW OF SYSTEMS:   Constitutional: Denies fevers, chills, 7lb weight loss since last week Eyes: Denies blurriness of vision Ears, nose, mouth, throat, and face: mucositis and sore throat Respiratory: Denies cough, dyspnea or wheezes Cardiovascular: Denies palpitation, chest discomfort or lower extremity swelling Gastrointestinal: moderate nausea and diarrhea, decreased appetite  Skin: Denies abnormal skin rashes Lymphatics: Denies new  lymphadenopathy or easy bruising Neurological:Denies numbness, tingling or new weaknesses Behavioral/Psych: Mood is stable, no new changes  Breast:  denies any pain or lumps or nodules in either breasts All other systems were reviewed with the patient and are negative.  I have reviewed the past medical history, past surgical history, social history and family history with the patient and they are unchanged from previous note.  ALLERGIES:  is allergic to penicillins and sulfonamide derivatives.  MEDICATIONS:  Current Outpatient Prescriptions  Medication Sig Dispense Refill  . cholecalciferol (VITAMIN D) 1000 UNITS tablet Take 1,000 Units by mouth daily.      . cyanocobalamin (,VITAMIN B-12,) 1000 MCG/ML injection Inject 1 mL (1,000 mcg total) into the muscle every 30 (thirty) days. 3 mL 3  . dexamethasone (DECADRON) 4 MG tablet Take 1 tablet (4 mg total) by mouth 2 (two) times daily. Start the day before Taxotere. Then again the day after chemo for 3 days. 30 tablet 1  . folic acid (FOLVITE) 1 MG tablet Take 1 mg by mouth daily.      .Marland KitchenLORazepam (ATIVAN) 0.5 MG tablet Take 1 tablet (0.5 mg total) by mouth 3 (three) times daily as needed. for anxiety 270 tablet 1  . losartan-hydrochlorothiazide (HYZAAR) 100-25 MG per tablet Take 1 tablet by mouth daily. 90 tablet 3  . metoprolol succinate (TOPROL-XL) 50 MG 24 hr tablet Take 1 tablet (50 mg total) by mouth 2 (two) times daily. Take with or immediately following a meal. 180 tablet 2  . omeprazole (PRILOSEC) 20 MG capsule TAKE ONE CAPSULE BY MOUTH ONCE DAILY 90 capsule 1  . ondansetron (ZOFRAN) 8 MG tablet Take  1 tablet (8 mg total) by mouth 2 (two) times daily. Start the day after chemo for 3 days. Then take as needed for nausea or vomiting. 30 tablet 1  . oxyCODONE-acetaminophen (ROXICET) 5-325 MG per tablet Take 1-2 tablets by mouth every 4 (four) hours as needed. 50 tablet 0  . terazosin (HYTRIN) 5 MG capsule Take 1 capsule (5 mg total) by  mouth at bedtime. 90 capsule 3  . atorvastatin (LIPITOR) 10 MG tablet Take 1 tablet (10 mg total) by mouth daily. (Patient not taking: Reported on 01/08/2015) 90 tablet 3  . cholestyramine (QUESTRAN) 4 G packet Take 1 packet (4 g total) by mouth 3 (three) times daily with meals. 60 each 2  . lidocaine-prilocaine (EMLA) cream Apply to affected area once (Patient not taking: Reported on 01/08/2015) 30 g 3  . potassium chloride (MICRO-K) 10 MEQ CR capsule Take 1 capsule (10 mEq total) by mouth daily. (Patient not taking: Reported on 01/08/2015) 90 capsule 3  . prochlorperazine (COMPAZINE) 10 MG tablet Take 1 tablet (10 mg total) by mouth every 6 (six) hours as needed (Nausea or vomiting). (Patient not taking: Reported on 01/08/2015) 30 tablet 1   No current facility-administered medications for this visit.    PHYSICAL EXAMINATION: ECOG PERFORMANCE STATUS: 0 - Asymptomatic  Filed Vitals:   01/08/15 1339  BP: 146/65  Pulse:   Temp:   Resp:    Filed Weights   01/08/15 1338  Weight: 222 lb 8 oz (100.925 kg)    GENERAL:alert, no distress and comfortable SKIN: skin color, texture, turgor are normal, no rashes or significant lesions EYES: normal, Conjunctiva are pink and non-injected, sclera clear OROPHARYNX:no exudate, no erythema and lips, grade 1 mucositis to buccal mucosa and oropharynx  NECK: supple, thyroid normal size, non-tender, without nodularity LYMPH:  no palpable lymphadenopathy in the cervical, axillary or inguinal LUNGS: clear to auscultation and percussion with normal breathing effort HEART: regular rate & rhythm and no murmurs and no lower extremity edema ABDOMEN:abdomen soft, non-tender and normal bowel sounds Musculoskeletal:no cyanosis of digits and no clubbing  NEURO: alert & oriented x 3 with fluent speech, no focal motor/sensory deficits  LABORATORY DATA:  I have reviewed the data as listed   Chemistry      Component Value Date/Time   NA 130* 01/08/2015 1318   NA  138 12/26/2014 0755   K 3.6 01/08/2015 1318   K 5.7* 12/26/2014 0755   CL 102 12/26/2014 0755   CO2 21* 01/08/2015 1318   CO2 28 11/05/2014 1456   BUN 23.2 01/08/2015 1318   BUN 45* 12/26/2014 0755   CREATININE 1.1 01/08/2015 1318   CREATININE 1.00 12/26/2014 0755      Component Value Date/Time   CALCIUM 9.0 01/08/2015 1318   CALCIUM 9.8 11/05/2014 1456   ALKPHOS 62 01/08/2015 1318   ALKPHOS 68 11/05/2014 1456   AST 16 01/08/2015 1318   AST 15 11/05/2014 1456   ALT 22 01/08/2015 1318   ALT 14 11/05/2014 1456   BILITOT 0.65 01/08/2015 1318   BILITOT 0.4 11/05/2014 1456       Lab Results  Component Value Date   WBC 6.0 01/08/2015   HGB 12.5 01/08/2015   HCT 36.2 01/08/2015   MCV 90.5 01/08/2015   PLT 168 01/08/2015   NEUTROABS 3.6 01/08/2015    ASSESSMENT & PLAN:  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 Neoadjuvant chemotherapy with TCH Perjeta 6 cycles followed by Herceptin maintenance 2. After initial chemotherapy repeat MRI and then surgery 3. Adjuvant radiation therapy may be a consideration 4. Followed by antiestrogen therapy  Current Plan: TCH-Perjeta cycle 1 Day 9 ECHO 12/27/14: EF 60-65%  Toxicities: 1. Diarrhea: To add questran powder BID to imodium use 2. Mucositis: magic mouthwash (without lidocaine) to be swished and swallowed QID PRN 3. Nausea: reminded patient about compazine in addition to using zofran 4. Fatigue 5. Elevated BP during/after taxotere. Returned to baseline after clonidine administered.   Will continue to monitor toxicities and lab work.   Patient to return in 2 weeks for the start of cycle 2.   No orders of the defined types were placed in this encounter.   The patient has a good understanding of the  overall plan. she agrees with it. she will call with any problems that may develop before the next visit here.   Laurie Panda, NP

## 2015-01-08 NOTE — Telephone Encounter (Signed)
Appointments made and avs printed for patient °

## 2015-01-16 ENCOUNTER — Other Ambulatory Visit: Payer: Self-pay | Admitting: *Deleted

## 2015-01-21 ENCOUNTER — Ambulatory Visit: Payer: Managed Care, Other (non HMO) | Admitting: Nutrition

## 2015-01-21 ENCOUNTER — Ambulatory Visit (HOSPITAL_BASED_OUTPATIENT_CLINIC_OR_DEPARTMENT_OTHER): Payer: Managed Care, Other (non HMO)

## 2015-01-21 ENCOUNTER — Encounter: Payer: Self-pay | Admitting: *Deleted

## 2015-01-21 ENCOUNTER — Other Ambulatory Visit (HOSPITAL_BASED_OUTPATIENT_CLINIC_OR_DEPARTMENT_OTHER): Payer: Managed Care, Other (non HMO)

## 2015-01-21 VITALS — BP 157/60 | HR 66 | Temp 98.7°F | Resp 18 | Ht 62.0 in | Wt 226.8 lb

## 2015-01-21 DIAGNOSIS — Z5112 Encounter for antineoplastic immunotherapy: Secondary | ICD-10-CM | POA: Diagnosis not present

## 2015-01-21 DIAGNOSIS — C50412 Malignant neoplasm of upper-outer quadrant of left female breast: Secondary | ICD-10-CM | POA: Diagnosis not present

## 2015-01-21 DIAGNOSIS — Z5111 Encounter for antineoplastic chemotherapy: Secondary | ICD-10-CM | POA: Diagnosis not present

## 2015-01-21 LAB — CBC WITH DIFFERENTIAL/PLATELET
BASO%: 0.5 % (ref 0.0–2.0)
Basophils Absolute: 0 10*3/uL (ref 0.0–0.1)
EOS%: 0.1 % (ref 0.0–7.0)
Eosinophils Absolute: 0 10*3/uL (ref 0.0–0.5)
HEMATOCRIT: 31.2 % — AB (ref 34.8–46.6)
HGB: 10.8 g/dL — ABNORMAL LOW (ref 11.6–15.9)
LYMPH%: 22.3 % (ref 14.0–49.7)
MCH: 31.6 pg (ref 25.1–34.0)
MCHC: 34.5 g/dL (ref 31.5–36.0)
MCV: 91.6 fL (ref 79.5–101.0)
MONO#: 0.5 10*3/uL (ref 0.1–0.9)
MONO%: 8.7 % (ref 0.0–14.0)
NEUT#: 4.3 10*3/uL (ref 1.5–6.5)
NEUT%: 68.4 % (ref 38.4–76.8)
Platelets: 296 10*3/uL (ref 145–400)
RBC: 3.41 10*6/uL — ABNORMAL LOW (ref 3.70–5.45)
RDW: 14.3 % (ref 11.2–14.5)
WBC: 6.3 10*3/uL (ref 3.9–10.3)
lymph#: 1.4 10*3/uL (ref 0.9–3.3)

## 2015-01-21 LAB — COMPREHENSIVE METABOLIC PANEL (CC13)
ALT: 16 U/L (ref 0–55)
ANION GAP: 11 meq/L (ref 3–11)
AST: 13 U/L (ref 5–34)
Albumin: 2.9 g/dL — ABNORMAL LOW (ref 3.5–5.0)
Alkaline Phosphatase: 67 U/L (ref 40–150)
BUN: 18.3 mg/dL (ref 7.0–26.0)
CALCIUM: 8.6 mg/dL (ref 8.4–10.4)
CO2: 20 meq/L — AB (ref 22–29)
Chloride: 109 mEq/L (ref 98–109)
Creatinine: 1 mg/dL (ref 0.6–1.1)
EGFR: 58 mL/min/{1.73_m2} — ABNORMAL LOW (ref >90)
Glucose: 115 mg/dl (ref 70–140)
POTASSIUM: 3.5 meq/L (ref 3.5–5.1)
Sodium: 140 mEq/L (ref 136–145)
TOTAL PROTEIN: 6.6 g/dL (ref 6.4–8.3)
Total Bilirubin: 0.27 mg/dL (ref 0.20–1.20)

## 2015-01-21 MED ORDER — SODIUM CHLORIDE 0.9 % IV SOLN
Freq: Once | INTRAVENOUS | Status: AC
  Administered 2015-01-21: 11:00:00 via INTRAVENOUS
  Filled 2015-01-21: qty 1

## 2015-01-21 MED ORDER — DIPHENHYDRAMINE HCL 25 MG PO CAPS
ORAL_CAPSULE | ORAL | Status: AC
  Filled 2015-01-21: qty 2

## 2015-01-21 MED ORDER — SODIUM CHLORIDE 0.9 % IJ SOLN
10.0000 mL | INTRAMUSCULAR | Status: DC | PRN
  Administered 2015-01-21: 10 mL
  Filled 2015-01-21: qty 10

## 2015-01-21 MED ORDER — SODIUM CHLORIDE 0.9 % IV SOLN
675.6000 mg | Freq: Once | INTRAVENOUS | Status: AC
  Administered 2015-01-21: 680 mg via INTRAVENOUS
  Filled 2015-01-21: qty 68

## 2015-01-21 MED ORDER — SODIUM CHLORIDE 0.9 % IV SOLN
420.0000 mg | Freq: Once | INTRAVENOUS | Status: AC
  Administered 2015-01-21: 420 mg via INTRAVENOUS
  Filled 2015-01-21: qty 14

## 2015-01-21 MED ORDER — SODIUM CHLORIDE 0.9 % IV SOLN
Freq: Once | INTRAVENOUS | Status: AC
  Administered 2015-01-21: 09:00:00 via INTRAVENOUS

## 2015-01-21 MED ORDER — DIPHENHYDRAMINE HCL 25 MG PO CAPS
50.0000 mg | ORAL_CAPSULE | Freq: Once | ORAL | Status: AC
  Administered 2015-01-21: 50 mg via ORAL

## 2015-01-21 MED ORDER — PEGFILGRASTIM 6 MG/0.6ML ~~LOC~~ PSKT
6.0000 mg | PREFILLED_SYRINGE | Freq: Once | SUBCUTANEOUS | Status: AC
  Administered 2015-01-21: 6 mg via SUBCUTANEOUS
  Filled 2015-01-21: qty 0.6

## 2015-01-21 MED ORDER — DOCETAXEL CHEMO INJECTION 160 MG/16ML
75.0000 mg/m2 | Freq: Once | INTRAVENOUS | Status: AC
  Administered 2015-01-21: 160 mg via INTRAVENOUS
  Filled 2015-01-21: qty 16

## 2015-01-21 MED ORDER — PALONOSETRON HCL INJECTION 0.25 MG/5ML
0.2500 mg | Freq: Once | INTRAVENOUS | Status: AC
  Administered 2015-01-21: 0.25 mg via INTRAVENOUS

## 2015-01-21 MED ORDER — ACETAMINOPHEN 325 MG PO TABS
ORAL_TABLET | ORAL | Status: AC
  Filled 2015-01-21: qty 2

## 2015-01-21 MED ORDER — ACETAMINOPHEN 325 MG PO TABS
650.0000 mg | ORAL_TABLET | Freq: Once | ORAL | Status: AC
  Administered 2015-01-21: 650 mg via ORAL

## 2015-01-21 MED ORDER — HEPARIN SOD (PORK) LOCK FLUSH 100 UNIT/ML IV SOLN
500.0000 [IU] | Freq: Once | INTRAVENOUS | Status: AC | PRN
  Administered 2015-01-21: 500 [IU]
  Filled 2015-01-21: qty 5

## 2015-01-21 MED ORDER — TRASTUZUMAB CHEMO INJECTION 440 MG
6.0000 mg/kg | Freq: Once | INTRAVENOUS | Status: AC
  Administered 2015-01-21: 630 mg via INTRAVENOUS
  Filled 2015-01-21: qty 30

## 2015-01-21 MED ORDER — PALONOSETRON HCL INJECTION 0.25 MG/5ML
INTRAVENOUS | Status: AC
  Filled 2015-01-21: qty 5

## 2015-01-21 NOTE — Patient Instructions (Signed)
[facility] Discharge Instructions for Patients Receiving Chemotherapy  Today you received the following chemotherapy agents :  Herceptin, Perjeta, Taxotere, Carboplatin.  To help prevent nausea and vomiting after your treatment, we encourage you to take your nausea medication as prescribed.   If you develop nausea and vomiting that is not controlled by your nausea medication, call the clinic.   BELOW ARE SYMPTOMS THAT SHOULD BE REPORTED IMMEDIATELY:  *FEVER GREATER THAN 100.5 F  *CHILLS WITH OR WITHOUT FEVER  NAUSEA AND VOMITING THAT IS NOT CONTROLLED WITH YOUR NAUSEA MEDICATION  *UNUSUAL SHORTNESS OF BREATH  *UNUSUAL BRUISING OR BLEEDING  TENDERNESS IN MOUTH AND THROAT WITH OR WITHOUT PRESENCE OF ULCERS  *URINARY PROBLEMS  *BOWEL PROBLEMS  UNUSUAL RASH Items with * indicate a potential emergency and should be followed up as soon as possible.  Feel free to call the clinic you have any questions or concerns. The clinic phone number is [phone].  Please show the CHEMO ALERT CARD at check-in to the Emergency Department and triage nurse.   

## 2015-01-21 NOTE — Progress Notes (Signed)
Patient was identified to be at risk for malnutrition on the MST secondary to weight loss and poor appetite.  69 year old female diagnosed with breast cancer.  She is a patient of Dr. Sonny Dandy.  Past medical history includes GERD, hypertension, obesity, vitamin B12 deficiency, vitamin D deficiency, and anxiety.  Medications include Lipitor, vitamin D, Questran, vitamin B12, Decadron, Folvite, Ativan, Prilosec, Zofran, and Compazine.  Labs include sodium of 130, glucose 153, and albumin 3.1 on May 31.  Height: 62 inches. Weight: 226.75 pounds on June 13.  Usual body weight: 232 pounds March 2016. BMI: 41.46  Patient reports severe nausea with dry heaves and diarrhea after treatment.   She was also having a poor appetite and difficulty swallowing. Patient reports she has had a significant improvement in diarrhea after taking Imodium and Questran. Patient reports nausea has resolved with Compazine alone. Patient able to eat and drink without difficulty. Weight today is increased from May 31by approximately 4 pounds.  Nutrition diagnosis: Unintended weight loss related to poor appetite, nausea and diarrhea as evidenced by 10 pound weight loss after initial treatment.  Intervention:  Patient was educated to consume small frequent meals and snacks with adequate protein. Encouraged patient to follow strategies for eating with nausea and diarrhea. Provided fact sheets. Questions were answered.  Teach back method used.  Contact information was given.  Monitoring, evaluation, goals: Patient will tolerate adequate calories and protein to promote weight maintenance of lean body mass.  Next visit: To be scheduled as needed.  **Disclaimer: This note was dictated with voice recognition software. Similar sounding words can inadvertently be transcribed and this note may contain transcription errors which may not have been corrected upon publication of note.**

## 2015-01-21 NOTE — Progress Notes (Signed)
Oncology Nurse Navigator Documentation  Oncology Nurse Navigator Flowsheets 01/21/2015  Navigator Encounter Type Treatment  Patient Visit Type Medonc  Treatment Phase Treatment  Barriers/Navigation Needs No barriers at this time  Time Spent with Patient 15   Met with pt during chemo treatment. Relate well and without complaints.

## 2015-01-27 NOTE — Assessment & Plan Note (Signed)
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 Neoadjuvant chemotherapy with TCH Perjeta 6 cycles followed by Herceptin maintenance 2. After initial chemotherapy repeat MRI and then surgery 3. Adjuvant radiation therapy may be a consideration 4. Followed by antiestrogen therapy  Current Plan: TCH-Perjeta cycle 2 Day 1 ECHO 12/27/14: EF 60-65%  Toxicities: 1. Diarrhea: To add questran powder BID to imodium use 2. Mucositis: magic mouthwash (without lidocaine) to be swished and swallowed QID PRN 3. Nausea: reminded patient about compazine in addition to using zofran 4. Fatigue 5. Elevated BP during/after taxotere. Returned to baseline after clonidine administered.   Will continue to monitor toxicities and lab work.

## 2015-01-28 ENCOUNTER — Ambulatory Visit (HOSPITAL_BASED_OUTPATIENT_CLINIC_OR_DEPARTMENT_OTHER): Payer: Managed Care, Other (non HMO) | Admitting: Hematology and Oncology

## 2015-01-28 ENCOUNTER — Ambulatory Visit (HOSPITAL_BASED_OUTPATIENT_CLINIC_OR_DEPARTMENT_OTHER): Payer: Managed Care, Other (non HMO)

## 2015-01-28 ENCOUNTER — Encounter: Payer: Self-pay | Admitting: Nurse Practitioner

## 2015-01-28 ENCOUNTER — Telehealth: Payer: Self-pay

## 2015-01-28 ENCOUNTER — Telehealth: Payer: Self-pay | Admitting: Hematology and Oncology

## 2015-01-28 ENCOUNTER — Other Ambulatory Visit (HOSPITAL_BASED_OUTPATIENT_CLINIC_OR_DEPARTMENT_OTHER): Payer: Managed Care, Other (non HMO)

## 2015-01-28 ENCOUNTER — Ambulatory Visit (HOSPITAL_BASED_OUTPATIENT_CLINIC_OR_DEPARTMENT_OTHER): Payer: Managed Care, Other (non HMO) | Admitting: Nurse Practitioner

## 2015-01-28 VITALS — BP 104/54 | HR 89 | Temp 98.2°F | Resp 18 | Ht 62.0 in | Wt 220.1 lb

## 2015-01-28 VITALS — BP 110/44 | HR 66 | Temp 98.2°F | Resp 20

## 2015-01-28 DIAGNOSIS — E860 Dehydration: Secondary | ICD-10-CM | POA: Insufficient documentation

## 2015-01-28 DIAGNOSIS — C50412 Malignant neoplasm of upper-outer quadrant of left female breast: Secondary | ICD-10-CM

## 2015-01-28 DIAGNOSIS — R251 Tremor, unspecified: Secondary | ICD-10-CM

## 2015-01-28 DIAGNOSIS — L299 Pruritus, unspecified: Secondary | ICD-10-CM | POA: Diagnosis not present

## 2015-01-28 DIAGNOSIS — R21 Rash and other nonspecific skin eruption: Secondary | ICD-10-CM

## 2015-01-28 DIAGNOSIS — L509 Urticaria, unspecified: Secondary | ICD-10-CM

## 2015-01-28 DIAGNOSIS — K1231 Oral mucositis (ulcerative) due to antineoplastic therapy: Secondary | ICD-10-CM | POA: Diagnosis not present

## 2015-01-28 DIAGNOSIS — R530 Neoplastic (malignant) related fatigue: Secondary | ICD-10-CM

## 2015-01-28 DIAGNOSIS — N19 Unspecified kidney failure: Secondary | ICD-10-CM | POA: Diagnosis not present

## 2015-01-28 DIAGNOSIS — R197 Diarrhea, unspecified: Secondary | ICD-10-CM | POA: Diagnosis not present

## 2015-01-28 DIAGNOSIS — R110 Nausea: Secondary | ICD-10-CM

## 2015-01-28 LAB — COMPREHENSIVE METABOLIC PANEL (CC13)
ALBUMIN: 3 g/dL — AB (ref 3.5–5.0)
ALK PHOS: 67 U/L (ref 40–150)
ALT: 24 U/L (ref 0–55)
AST: 14 U/L (ref 5–34)
Anion Gap: 13 mEq/L — ABNORMAL HIGH (ref 3–11)
BUN: 30.8 mg/dL — ABNORMAL HIGH (ref 7.0–26.0)
CO2: 19 mEq/L — ABNORMAL LOW (ref 22–29)
Calcium: 9.2 mg/dL (ref 8.4–10.4)
Chloride: 98 mEq/L (ref 98–109)
Creatinine: 1.9 mg/dL — ABNORMAL HIGH (ref 0.6–1.1)
EGFR: 26 mL/min/{1.73_m2} — ABNORMAL LOW (ref >90)
Glucose: 147 mg/dl — ABNORMAL HIGH (ref 70–140)
POTASSIUM: 3.9 meq/L (ref 3.5–5.1)
SODIUM: 130 meq/L — AB (ref 136–145)
TOTAL PROTEIN: 6.7 g/dL (ref 6.4–8.3)
Total Bilirubin: 0.57 mg/dL (ref 0.20–1.20)

## 2015-01-28 LAB — CBC WITH DIFFERENTIAL/PLATELET
BASO%: 0.1 % (ref 0.0–2.0)
BASOS ABS: 0 10*3/uL (ref 0.0–0.1)
EOS ABS: 0 10*3/uL (ref 0.0–0.5)
EOS%: 0.1 % (ref 0.0–7.0)
HEMATOCRIT: 32.2 % — AB (ref 34.8–46.6)
HEMOGLOBIN: 11.1 g/dL — AB (ref 11.6–15.9)
LYMPH%: 39.2 % (ref 14.0–49.7)
MCH: 31.4 pg (ref 25.1–34.0)
MCHC: 34.6 g/dL (ref 31.5–36.0)
MCV: 90.7 fL (ref 79.5–101.0)
MONO#: 0.8 10*3/uL (ref 0.1–0.9)
MONO%: 20.4 % — AB (ref 0.0–14.0)
NEUT%: 40.2 % (ref 38.4–76.8)
NEUTROS ABS: 1.7 10*3/uL (ref 1.5–6.5)
PLATELETS: 225 10*3/uL (ref 145–400)
RBC: 3.55 10*6/uL — ABNORMAL LOW (ref 3.70–5.45)
RDW: 14.4 % (ref 11.2–14.5)
WBC: 4.1 10*3/uL (ref 3.9–10.3)
lymph#: 1.6 10*3/uL (ref 0.9–3.3)

## 2015-01-28 MED ORDER — FAMOTIDINE IN NACL 20-0.9 MG/50ML-% IV SOLN
20.0000 mg | Freq: Once | INTRAVENOUS | Status: AC
  Administered 2015-01-28: 20 mg via INTRAVENOUS

## 2015-01-28 MED ORDER — SODIUM CHLORIDE 0.9 % IV SOLN
1000.0000 mL | Freq: Once | INTRAVENOUS | Status: AC
  Administered 2015-01-28: 1000 mL via INTRAVENOUS

## 2015-01-28 MED ORDER — DIPHENHYDRAMINE HCL 50 MG/ML IJ SOLN
INTRAMUSCULAR | Status: AC
  Filled 2015-01-28: qty 1

## 2015-01-28 MED ORDER — DIPHENHYDRAMINE HCL 50 MG/ML IJ SOLN
25.0000 mg | Freq: Once | INTRAMUSCULAR | Status: AC
  Administered 2015-01-28: 16:00:00 via INTRAVENOUS

## 2015-01-28 MED ORDER — SODIUM CHLORIDE 0.9 % IV SOLN
Freq: Once | INTRAVENOUS | Status: AC
  Administered 2015-01-28: 15:00:00 via INTRAVENOUS
  Filled 2015-01-28: qty 8

## 2015-01-28 MED ORDER — FAMOTIDINE IN NACL 20-0.9 MG/50ML-% IV SOLN
INTRAVENOUS | Status: AC
  Filled 2015-01-28: qty 50

## 2015-01-28 NOTE — Telephone Encounter (Signed)
Appointments made per pof,per patient she will not be here in two weeks and request to be seen 7/11,inbox to dr Lindi Adie and terri to advise,patient aware to get an avs in chemo

## 2015-01-28 NOTE — Progress Notes (Signed)
Patient Care Team: Aleksei [name] V, MD as PCP - General  DIAGNOSIS: No matching staging information was found for the patient.  SUMMARY OF ONCOLOGIC HISTORY:   Breast cancer of upper-outer quadrant of left female breast   12/07/2014 Initial Diagnosis Left breast 2:00 position: Invasive ductal carcinoma grade 2, ER 5%, PR 0%, Ki-67 80%, HER-2 positive ratio 6.96   12/07/2014 Mammogram Large left breast mass 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, 2 small benign nodules in the right breast stable since 2002, other consistent with normal intramammary lymph node left axilla ultrasound normal-sized lymph nodes   12/31/2014 -  Neo-Adjuvant Chemotherapy TCH Perjeta neoadjuvant chemotherapy 6    CHIEF COMPLIANT: Cycle 2 day 8 TCH Perjeta  INTERVAL HISTORY: Savannah Howard is a 68-year-old with above-mentioned history of left breast cancer being treated with neo-adjuvant chemotherapy with TCH Perjeta. Patient has a long list of complaints today include diarrhea, nausea and vomiting, diffuse skin itching and welts all over which responded to Benadryl, severe tremors, tongue feels coated, hoarseness O Wallace, chest discomfort because of retching sensation and emesis. The breast cancer appears to feel much smaller  REVIEW OF SYSTEMS:   Constitutional: Denies fevers, chills or abnormal weight loss, severe fatigue wheelchair bound, tremors Eyes: Denies blurriness of vision Ears, nose, mouth, throat, and face: Denies mucositis or sore throat Respiratory: Denies cough, dyspnea or wheezes Cardiovascular: Denies palpitation, chest discomfort or lower extremity swelling Gastrointestinal:  Nausea vomiting diarrhea Skin: Denies abnormal skin rashes Lymphatics: Denies new lymphadenopathy or easy bruising Neurological:Denies numbness, tingling or new weaknesses Behavioral/Psych: Mood is stable, no new changes  All other systems were reviewed with the patient and are negative.  I have reviewed the past medical history, past  surgical history, social history and family history with the patient and they are unchanged from previous note.  ALLERGIES:  is allergic to penicillins and sulfonamide derivatives.  MEDICATIONS:  Current Outpatient Prescriptions  Medication Sig Dispense Refill  . atorvastatin (LIPITOR) 10 MG tablet Take 1 tablet (10 mg total) by mouth daily. (Patient not taking: Reported on 01/08/2015) 90 tablet 3  . cholecalciferol (VITAMIN D) 1000 UNITS tablet Take 1,000 Units by mouth daily.      . cholestyramine (QUESTRAN) 4 G packet Take 1 packet (4 g total) by mouth 3 (three) times daily with meals. 60 each 2  . cyanocobalamin (,VITAMIN B-12,) 1000 MCG/ML injection Inject 1 mL (1,000 mcg total) into the muscle every 30 (thirty) days. 3 mL 3  . dexamethasone (DECADRON) 4 MG tablet Take 1 tablet (4 mg total) by mouth 2 (two) times daily. Start the day before Taxotere. Then again the day after chemo for 3 days. 30 tablet 1  . folic acid (FOLVITE) 1 MG tablet Take 1 mg by mouth daily.      . lidocaine-prilocaine (EMLA) cream Apply to affected area once (Patient not taking: Reported on 01/08/2015) 30 g 3  . LORazepam (ATIVAN) 0.5 MG tablet Take 1 tablet (0.5 mg total) by mouth 3 (three) times daily as needed. for anxiety 270 tablet 1  . losartan-hydrochlorothiazide (HYZAAR) 100-25 MG per tablet Take 1 tablet by mouth daily. 90 tablet 3  . metoprolol succinate (TOPROL-XL) 50 MG 24 hr tablet Take 1 tablet (50 mg total) by mouth 2 (two) times daily. Take with or immediately following a meal. 180 tablet 2  . omeprazole (PRILOSEC) 20 MG capsule TAKE ONE CAPSULE BY MOUTH ONCE DAILY 90 capsule 1  . ondansetron (ZOFRAN) 8 MG tablet Take 1   tablet (8 mg total) by mouth 2 (two) times daily. Start the day after chemo for 3 days. Then take as needed for nausea or vomiting. 30 tablet 1  . oxyCODONE-acetaminophen (ROXICET) 5-325 MG per tablet Take 1-2 tablets by mouth every 4 (four) hours as needed. 50 tablet 0  . potassium  chloride (MICRO-K) 10 MEQ CR capsule Take 1 capsule (10 mEq total) by mouth daily. (Patient not taking: Reported on 01/08/2015) 90 capsule 3  . prochlorperazine (COMPAZINE) 10 MG tablet Take 1 tablet (10 mg total) by mouth every 6 (six) hours as needed (Nausea or vomiting). (Patient not taking: Reported on 01/08/2015) 30 tablet 1  . terazosin (HYTRIN) 5 MG capsule Take 1 capsule (5 mg total) by mouth at bedtime. 90 capsule 3   No current facility-administered medications for this visit.    PHYSICAL EXAMINATION: ECOG PERFORMANCE STATUS: 2 - Symptomatic, <50% confined to bed  Filed Vitals:   01/28/15 1341  BP: 104/54  Pulse: 89  Temp: 98.2 F (36.8 C)  Resp: 18   Filed Weights   01/28/15 1341  Weight: 220 lb 1.6 oz (99.837 kg)    GENERAL:alert, no distress and comfortable SKIN: skin color, texture, turgor are normal, no rashes or significant lesions EYES: normal, Conjunctiva are pink and non-injected, sclera clear OROPHARYNX:no exudate, no erythema and lips, buccal mucosa, and tongue normal  NECK: supple, thyroid normal size, non-tender, without nodularity LYMPH:  no palpable lymphadenopathy in the cervical, axillary or inguinal LUNGS: clear to auscultation and percussion with normal breathing effort HEART: regular rate & rhythm and no murmurs and no lower extremity edema ABDOMEN: Nausea vomiting diarrhea Musculoskeletal:no cyanosis of digits and no clubbing  NEURO: alert & oriented x 3 with fluent speech, no focal motor/sensory deficits, tremors  LABORATORY DATA:  I have reviewed the data as listed   Chemistry      Component Value Date/Time   NA 140 01/21/2015 0857   NA 138 12/26/2014 0755   K 3.5 01/21/2015 0857   K 5.7* 12/26/2014 0755   CL 102 12/26/2014 0755   CO2 20* 01/21/2015 0857   CO2 28 11/05/2014 1456   BUN 18.3 01/21/2015 0857   BUN 45* 12/26/2014 0755   CREATININE 1.0 01/21/2015 0857   CREATININE 1.00 12/26/2014 0755      Component Value Date/Time    CALCIUM 8.6 01/21/2015 0857   CALCIUM 9.8 11/05/2014 1456   ALKPHOS 67 01/21/2015 0857   ALKPHOS 68 11/05/2014 1456   AST 13 01/21/2015 0857   AST 15 11/05/2014 1456   ALT 16 01/21/2015 0857   ALT 14 11/05/2014 1456   BILITOT 0.27 01/21/2015 0857   BILITOT 0.4 11/05/2014 1456       Lab Results  Component Value Date   WBC 4.1 01/28/2015   HGB 11.1* 01/28/2015   HCT 32.2* 01/28/2015   MCV 90.7 01/28/2015   PLT 225 01/28/2015   NEUTROABS 1.7 01/28/2015    ASSESSMENT & PLAN:  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 Neoadjuvant chemotherapy with TCH Perjeta 6 cycles followed by Herceptin maintenance 2. After initial chemotherapy repeat MRI and then surgery 3. Adjuvant radiation therapy may be a consideration 4. Followed by antiestrogen therapy  Current Plan: TCH-Perjeta cycle 2 Day 8 (  plan to decrease chemotherapy dosage as well as discontinue Perjeta from next cycle) ECHO 12/27/14: EF 60-65%  Toxicities: 1. Diarrhea: Lucrezia Starch has been helpful but not completely resolved her diarrhea. She still goes 4-5 times a day. Because of intractable diarrhea and renal failure, I will discontinue Perjeta from the next cycle. 2. Mucositis: magic mouthwash (without lidocaine) to be swished and swallowed QID PRN 3. Nausea: Nausea is better than the first cycle of chemotherapy but she continues to have multiple episodes of nausea and vomiting every day. Because of her nausea issues, we will decrease the dosage of chemotherapy. 4. Fatigue: Getting worse with each cycle of chemotherapy 5. Elevated BP during/after taxotere. Returned to baseline after clonidine administered.  6. Skin rash: Apparently started on Friday and lasted all weekend she is scratching all over her  body with large welts on her neck and back along with her arms. Benadryl seems to be helping I encouraged her to use Benadryl along with hydrocortisone cream and moisturizing lotion. The only new medication had been Compazine. I instructed her to discontinue Compazine for this time. 7. Tremors 8. Renal failure: Creatinine 1.9 we will give her fluids Monday Wednesday and Friday along with Zofran.  Return to clinic in 2 weeks for cycle 3   No orders of the defined types were placed in this encounter.   The patient has a good understanding of the overall plan. she agrees with it. she will call with any problems that may develop before the next visit here.   Rulon Eisenmenger, MD

## 2015-01-28 NOTE — Assessment & Plan Note (Signed)
Patient received her second cycle of Adjuvant chemotherapy on 01/21/2015.  She is suffering from some mild dehydration; and returned to the [facility] today to receive additional IV fluid rehydration.  Sodium was 1:30 and chloride was 98.  Creatinine has increased from 1.0 up to 1.9.  Most likely, elevated creatinine is secondary to dehydration.  Will continue to monitor closely.  Patient is scheduled to return on Wednesday, 01/30/2015 for additional IV fluid rehydration.

## 2015-01-28 NOTE — Progress Notes (Signed)
SYMPTOM MANAGEMENT CLINIC   HPI: Savannah Howard 69 y.o. female diagnosed with breast cancer.  Currently undergoing Taxotere/carboplatin/Herceptin/Perjeta chemotherapy regimen.  Patient states that she has been developing intermittent rash/hives since this past weekend.  She states that her rash will resolve with Benadryl; but always returns.  She denies any difficulty managing her secretions or swallowing.-But she does complain of a hoarse voice on occasion.  Patient presented to the cancer Center today to receive IV fluid rehydration.  While at the cancer Center-she once again developed full body hives which were pruritic.  Patient was noted to be excessively scratching at her hands; causing the hands to become even more red.  Patient had no other allergic-type symptoms whatsoever.  Managing all secretions fine.  Other than her 2 cycles of chemotherapy; patient's only other new medication was Compazine.  Patient was encouraged to hold any further Compazine for the time being.  Patient was given Benadryl 25 mg IV and Pepcid 20 mg IV which completely resolved all hives.  Patient was advised to continue with the Benadryl 25 mg every 6 hours and Pepcid 20 g every 12 hours while at home.  She was encouraged to call/return or go directly to the emergency department for any worsening symptoms whatsoever.  May very well need to consider a prednisone taper pack.  If intermittent hives continue.    HPI  ROS  Past Medical History  Diagnosis Date  . GERD (gastroesophageal reflux disease)   . Hypertension   . Obesity   . Vitamin B 12 deficiency   . Vitamin D deficiency   . Anxiety   . Allergy     rhinitis  . Cancer     left breast cancer    Past Surgical History  Procedure Laterality Date  . Cholecystectomy    . Esophageal dilation      x3  . Portacath placement Right 12/26/2014    Procedure: INSERTION PORT-A-CATH;  Surgeon: Chevis Pretty III, MD;  Location: [facility];   Service: General;  Laterality: Right;    has VITAMIN B12 DEFICIENCY; VITAMIN D DEFICIENCY; ANXIETY; UPPER RESPIRATORY INFECTION, ACUTE; ALLERGIC RHINITIS; GERD; HYPERGLYCEMIA; Hypertension; Dyslipidemia; Well adult exam; Breast cancer of upper-outer quadrant of left female breast; Chemotherapy-induced nausea; Chemotherapy induced diarrhea; Mucositis due to chemotherapy; Dehydration; and Rash on her problem list.    is allergic to penicillins and sulfonamide derivatives.    Medication List       This list is accurate as of: 01/28/15  5:27 PM.  Always use your most recent med list.               atorvastatin 10 MG tablet  Commonly known as:  LIPITOR  Take 1 tablet (10 mg total) by mouth daily.     cholecalciferol 1000 UNITS tablet  Commonly known as:  VITAMIN D  Take 1,000 Units by mouth daily.     cholestyramine 4 G packet  Commonly known as:  QUESTRAN  Take 1 packet (4 g total) by mouth 3 (three) times daily with meals.     cyanocobalamin 1000 MCG/ML injection  Commonly known as:  (VITAMIN B-12)  Inject 1 mL (1,000 mcg total) into the muscle every 30 (thirty) days.     dexamethasone 4 MG tablet  Commonly known as:  DECADRON  Take 1 tablet (4 mg total) by mouth 2 (two) times daily. Start the day before Taxotere. Then again the day after chemo for 3 days.     folic  acid 1 MG tablet  Commonly known as:  FOLVITE  Take 1 mg by mouth daily.     lidocaine-prilocaine cream  Commonly known as:  EMLA  Apply to affected area once     LORazepam 0.5 MG tablet  Commonly known as:  ATIVAN  Take 1 tablet (0.5 mg total) by mouth 3 (three) times daily as needed. for anxiety     losartan-hydrochlorothiazide 100-25 MG per tablet  Commonly known as:  HYZAAR  Take 1 tablet by mouth daily.     metoprolol succinate 50 MG 24 hr tablet  Commonly known as:  TOPROL-XL  Take 1 tablet (50 mg total) by mouth 2 (two) times daily. Take with or immediately following a meal.     nystatin 100000  UNIT/ML suspension  Commonly known as:  MYCOSTATIN     omeprazole 20 MG capsule  Commonly known as:  PRILOSEC  TAKE ONE CAPSULE BY MOUTH ONCE DAILY     ondansetron 8 MG tablet  Commonly known as:  ZOFRAN  Take 1 tablet (8 mg total) by mouth 2 (two) times daily. Start the day after chemo for 3 days. Then take as needed for nausea or vomiting.     oxyCODONE-acetaminophen 5-325 MG per tablet  Commonly known as:  ROXICET  Take 1-2 tablets by mouth every 4 (four) hours as needed.     potassium chloride 10 MEQ CR capsule  Commonly known as:  MICRO-K  Take 1 capsule (10 mEq total) by mouth daily.     prochlorperazine 10 MG tablet  Commonly known as:  COMPAZINE  Take 1 tablet (10 mg total) by mouth every 6 (six) hours as needed (Nausea or vomiting).     terazosin 5 MG capsule  Commonly known as:  HYTRIN  Take 1 capsule (5 mg total) by mouth at bedtime.         PHYSICAL EXAMINATION  Oncology Vitals 01/28/2015 01/28/2015 01/21/2015 01/08/2015 12/31/2014 12/31/2014 12/31/2014  Height - 158 cm 158 cm 158 cm - - -  Weight - 99.837 kg 102.853 kg 100.925 kg - - -  Weight (lbs) - 220 lbs 2 oz 226 lbs 12 oz 222 lbs 8 oz - - -  BMI (kg/m2) - 40.26 kg/m2 41.47 kg/m2 40.7 kg/m2 - - -  Temp 98.2 98.2 98.7 98.1 - - 98.2  Pulse 66 89 66 75 55 59 61  Resp $Rem'20 18 18 18 18 18 18  'ZCOE$ SpO2 100 98 99 - 98 97 -  BSA (m2) - 2.09 m2 2.12 m2 2.1 m2 - - -   BP Readings from Last 3 Encounters:  01/28/15 110/44  01/28/15 104/54  01/21/15 157/60    Physical Exam  Constitutional: She is oriented to person, place, and time and well-developed, well-nourished, and in no distress.  HENT:  Head: Normocephalic and atraumatic.  Mouth/Throat: Oropharynx is clear and moist.  Eyes: Conjunctivae and EOM are normal. Pupils are equal, round, and reactive to light. Right eye exhibits no discharge. Left eye exhibits no discharge. No scleral icterus.  Neck: Normal range of motion.  Pulmonary/Chest: Effort normal. No stridor.  No respiratory distress.  Musculoskeletal: Normal range of motion. She exhibits no edema.  Neurological: She is alert and oriented to person, place, and time.  Skin: Skin is warm and dry. Rash noted. There is erythema. No pallor.  Full body hives noted; with increased redness to dorsal hands from scratching.  Patient was given samples of Aquaphor to try.  Psychiatric: Affect normal.  Nursing note and vitals reviewed.   LABORATORY DATA:. Appointment on 01/28/2015  Component Date Value Ref Range Status  . WBC 01/28/2015 4.1  3.9 - 10.3 10e3/uL Final  . NEUT# 01/28/2015 1.7  1.5 - 6.5 10e3/uL Final  . HGB 01/28/2015 11.1* 11.6 - 15.9 g/dL Final  . HCT 01/28/2015 32.2* 34.8 - 46.6 % Final  . Platelets 01/28/2015 225  145 - 400 10e3/uL Final  . MCV 01/28/2015 90.7  79.5 - 101.0 fL Final  . MCH 01/28/2015 31.4  25.1 - 34.0 pg Final  . MCHC 01/28/2015 34.6  31.5 - 36.0 g/dL Final  . RBC 01/28/2015 3.55* 3.70 - 5.45 10e6/uL Final  . RDW 01/28/2015 14.4  11.2 - 14.5 % Final  . lymph# 01/28/2015 1.6  0.9 - 3.3 10e3/uL Final  . MONO# 01/28/2015 0.8  0.1 - 0.9 10e3/uL Final  . Eosinophils Absolute 01/28/2015 0.0  0.0 - 0.5 10e3/uL Final  . Basophils Absolute 01/28/2015 0.0  0.0 - 0.1 10e3/uL Final  . NEUT% 01/28/2015 40.2  38.4 - 76.8 % Final  . LYMPH% 01/28/2015 39.2  14.0 - 49.7 % Final  . MONO% 01/28/2015 20.4* 0.0 - 14.0 % Final  . EOS% 01/28/2015 0.1  0.0 - 7.0 % Final  . BASO% 01/28/2015 0.1  0.0 - 2.0 % Final  . Sodium 01/28/2015 130* 136 - 145 mEq/L Final  . Potassium 01/28/2015 3.9  3.5 - 5.1 mEq/L Final  . Chloride 01/28/2015 98  98 - 109 mEq/L Final  . CO2 01/28/2015 19* 22 - 29 mEq/L Final  . Glucose 01/28/2015 147* 70 - 140 mg/dl Final  . BUN 01/28/2015 30.8* 7.0 - 26.0 mg/dL Final  . Creatinine 01/28/2015 1.9* 0.6 - 1.1 mg/dL Final  . Total Bilirubin 01/28/2015 0.57  0.20 - 1.20 mg/dL Final  . Alkaline Phosphatase 01/28/2015 67  40 - 150 U/L Final  . AST 01/28/2015 14  5 -  34 U/L Final  . ALT 01/28/2015 24  0 - 55 U/L Final  . Total Protein 01/28/2015 6.7  6.4 - 8.3 g/dL Final  . Albumin 01/28/2015 3.0* 3.5 - 5.0 g/dL Final  . Calcium 01/28/2015 9.2  8.4 - 10.4 mg/dL Final  . Anion Gap 01/28/2015 13* 3 - 11 mEq/L Final  . EGFR 01/28/2015 26* >90 ml/min/1.73 m2 Final   eGFR is calculated using the CKD-EPI Creatinine Equation (2009)     RADIOGRAPHIC STUDIES: No results found.  ASSESSMENT/PLAN:    Breast cancer of upper-outer quadrant of left female breast Patient received cycle 2 of her Taxotere/carboplatin/Herceptin/Perjeta chemotherapy on 01/21/2015.  Patient complained of chronic diarrhea following her last cycle of chemotherapy; so the plan is to hold any further Perjeta from her chemotherapy regimen.  Patient's third cycle of chemotherapy has not in scheduled as of yet.  Patient is scheduled to receive IV fluid rehydration on a regular basis several times per week.  Dehydration Patient received her second cycle of Adjuvant chemotherapy on 01/21/2015.  She is suffering from some mild dehydration; and returned to the [facility] today to receive additional IV fluid rehydration.  Sodium was 1:30 and chloride was 98.  Creatinine has increased from 1.0 up to 1.9.  Most likely, elevated creatinine is secondary to dehydration.  Will continue to monitor closely.  Patient is scheduled to return on Wednesday, 01/30/2015 for additional IV fluid rehydration.  Rash Patient states that she has been developing intermittent rash/hives since this past weekend.  She states that her rash will resolve with  Benadryl; but always returns.  She denies any difficulty managing her secretions or swallowing.-But she does complain of a hoarse voice on occasion.  Patient presented to the [facility] today to receive IV fluid rehydration.  While at the cancer Center-she once again developed full body hives which were pruritic.  Patient was noted to be excessively scratching at  her hands; causing the hands to become even more red.  Patient had no other allergic-type symptoms whatsoever.  Managing all secretions fine.  Other than her 2 cycles of chemotherapy; patient's only other new medication was Compazine.  Patient was encouraged to hold any further Compazine for the time being.  Patient was given Benadryl 25 mg IV and Pepcid 20 mg IV which completely resolved all hives.  Patient was advised to continue with the Benadryl 25 mg every 6 hours and Pepcid 20 g every 12 hours while at home.  She was encouraged to call/return or go directly to the emergency department for any worsening symptoms whatsoever.  May very well need to consider a prednisone taper pack.  If intermittent hives continue.  Patient stated understanding of all instructions; and was in agreement with this plan of care. The patient knows to call the clinic with any problems, questions or concerns.   Review/collaboration with Dr. Lindi Adie regarding all aspects of patient's visit today.   Total time spent with patient was 25 minutes;  with greater than 75 percent of that time spent in face to face counseling regarding patient's symptoms,  and coordination of care and follow up.  Disclaimer: This note was dictated with voice recognition software. Similar sounding words can inadvertently be transcribed and may not be corrected upon review.   Drue Second, NP 01/28/2015

## 2015-01-28 NOTE — Patient Instructions (Signed)

## 2015-01-28 NOTE — Progress Notes (Signed)
At 1655, rash is  Barely  Noticeable, denies  Itching. Denies difficulty breathing

## 2015-01-28 NOTE — Progress Notes (Signed)
At 1630, rash  Is less 'Less Itching"  Rash is noted to be pink... Lighter in colouring than when 1st complaint  Of itching.

## 2015-01-28 NOTE — Assessment & Plan Note (Signed)
Patient received cycle 2 of her Taxotere/carboplatin/Herceptin/Perjeta chemotherapy on 01/21/2015.  Patient complained of chronic diarrhea following her last cycle of chemotherapy; so the plan is to hold any further Perjeta from her chemotherapy regimen.  Patient's third cycle of chemotherapy has not in scheduled as of yet.  Patient is scheduled to receive IV fluid rehydration on a regular basis several times per week.

## 2015-01-28 NOTE — Assessment & Plan Note (Signed)
Savannah Howard states that she has been developing intermittent rash/hives since this past weekend.  She states that her rash will resolve with Benadryl; but always returns.  She denies any difficulty managing her secretions or swallowing.-But she does complain of a hoarse voice on occasion.  Savannah Howard presented to the [facility] today to receive IV fluid rehydration.  While at the cancer Center-she once again developed full body hives which were pruritic.  Savannah Howard was noted to be excessively scratching at her hands; causing the hands to become even more red.  Savannah Howard had no other allergic-type symptoms whatsoever.  Managing all secretions fine.  Other than her 2 cycles of chemotherapy; Savannah Howard's only other new medication was Compazine.  Savannah Howard was encouraged to hold any further Compazine for the time being.  Savannah Howard was given Benadryl 25 mg IV and Pepcid 20 mg IV which completely resolved all hives.  Savannah Howard was advised to continue with the Benadryl 25 mg every 6 hours and Pepcid 20 g every 12 hours while at home.  She was encouraged to call/return or go directly to the emergency department for any worsening symptoms whatsoever.  May very well need to consider a prednisone taper pack.  If intermittent hives continue.

## 2015-01-28 NOTE — Telephone Encounter (Signed)
Call Report rcvd by fax from [facility] 01/27/15 - instructed to call 911.  Attempted to call pt to follow up - no answer.  Report sent to scan.  Will attempt call again.

## 2015-01-28 NOTE — Progress Notes (Signed)
At  1520, pt c/o itching and hives.... Red lesions noted on bilateral  Forearms and shoulders.  Dr. Lindi Adie called... Orders given for   Benadryl  IV.  Pt to be seen by Granville Lewis, NP. Benadryl given IV as ordered At 1535, Granville Lewis assessing patient. Denies difficulty breathing.  Pepcid given 20 mg IV per order.

## 2015-01-29 ENCOUNTER — Other Ambulatory Visit: Payer: Self-pay | Admitting: *Deleted

## 2015-01-29 ENCOUNTER — Encounter: Payer: Self-pay | Admitting: Internal Medicine

## 2015-01-29 ENCOUNTER — Other Ambulatory Visit: Payer: Self-pay

## 2015-01-29 ENCOUNTER — Telehealth: Payer: Self-pay

## 2015-01-29 ENCOUNTER — Ambulatory Visit (INDEPENDENT_AMBULATORY_CARE_PROVIDER_SITE_OTHER): Payer: Managed Care, Other (non HMO) | Admitting: Internal Medicine

## 2015-01-29 VITALS — BP 122/70 | HR 83 | Wt 221.0 lb

## 2015-01-29 DIAGNOSIS — C50412 Malignant neoplasm of upper-outer quadrant of left female breast: Secondary | ICD-10-CM

## 2015-01-29 DIAGNOSIS — I10 Essential (primary) hypertension: Secondary | ICD-10-CM

## 2015-01-29 DIAGNOSIS — R531 Weakness: Secondary | ICD-10-CM

## 2015-01-29 DIAGNOSIS — T451X5A Adverse effect of antineoplastic and immunosuppressive drugs, initial encounter: Secondary | ICD-10-CM

## 2015-01-29 DIAGNOSIS — K521 Toxic gastroenteritis and colitis: Secondary | ICD-10-CM | POA: Diagnosis not present

## 2015-01-29 MED ORDER — DIPHENOXYLATE-ATROPINE 2.5-0.025 MG PO TABS: 1.0000 | ORAL_TABLET | Freq: Four times a day (QID) | ORAL | Status: DC | PRN

## 2015-01-29 MED ORDER — MAGIC MOUTHWASH: 5.0000 mL | Freq: Four times a day (QID) | ORAL | Status: DC

## 2015-01-29 NOTE — Assessment & Plan Note (Signed)
x5 weeks  Cont Questran Lomotil Consider a probiotic C diff stool test

## 2015-01-29 NOTE — Assessment & Plan Note (Signed)
6/16 chemo/diarrhea related Hold Hyzaar. May need to hold Toprol

## 2015-01-29 NOTE — Progress Notes (Signed)
Pre visit review using our clinic review tool, if applicable. No additional management support is needed unless otherwise documented below in the visit note. 

## 2015-01-29 NOTE — Telephone Encounter (Signed)
Called to follow up with pt after seen by Selena Lesser, NP in chemo 6/20. Pt had rash and dehydration. Pt states she feels better today and the rash is just on her hands now but is using the cream and taking the benadryl. Pt confirmed appt for fluids tomorrow at 130. Pt denies any further questions or concerns.

## 2015-01-29 NOTE — Patient Instructions (Signed)
Hold Hyzaar. May need to hold Toprol

## 2015-01-29 NOTE — Progress Notes (Signed)
Subjective:    HPI  C/o weakness and diarrhea related to breast cancer chemo. Imodium did not help. 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 Neoadjuvant chemotherapy with TCH Perjeta 6 cycles followed by Herceptin maintenance 2. After initial chemotherapy repeat MRI and then surgery 3. Adjuvant radiation therapy may be a consideration 4. Followed by antiestrogen therapy  Current Plan: TCH-Perjeta cycle 2 Day 1 ECHO 12/27/14: EF 60-65%         The patient presents for a follow-up of  chronic hypertension, chronic dyslipidemia, elevated glucose stress controlled with medicines.    BP Readings from Last 3 Encounters:  01/29/15 122/70  01/28/15 110/44  01/28/15 104/54   Wt Readings from Last 3 Encounters:  01/29/15 221 lb (100.245 kg)  01/28/15 220 lb 1.6 oz (99.837 kg)  01/21/15 226 lb 12 oz (102.853 kg)      Review of Systems  Constitutional: Negative for chills, activity change, appetite change, fatigue and unexpected weight change.  HENT: Negative for congestion, mouth sores and sinus pressure.   Eyes: Negative for visual disturbance.  Respiratory: Negative for cough and chest tightness.   Gastrointestinal: Negative for nausea and abdominal pain.  Genitourinary: Negative for frequency, difficulty urinating and vaginal pain.  Musculoskeletal: Negative for back pain and gait problem.  Skin: Negative for pallor and rash.  Neurological: Negative for dizziness, tremors, weakness, numbness and headaches.  Psychiatric/Behavioral: Negative for confusion and sleep disturbance. The patient is nervous/anxious.        Objective:   Physical Exam  Constitutional: She appears well-developed. No distress.  obese  HENT:  Head: Normocephalic.  Right  Ear: External ear normal.  Left Ear: External ear normal.  Nose: Nose normal.  Mouth/Throat: Oropharynx is clear and moist.  Eyes: Conjunctivae are normal. Pupils are equal, round, and reactive to light. Right eye exhibits no discharge. Left eye exhibits no discharge.  Neck: Normal range of motion. Neck supple. No JVD present. No tracheal deviation present. No thyromegaly present.  Cardiovascular: Normal rate, regular rhythm and normal heart sounds.   Pulmonary/Chest: No stridor. No respiratory distress. She has no wheezes.  Abdominal: Soft. Bowel sounds are normal. She exhibits no distension and no mass. There is no tenderness. There is no rebound and no guarding.  Musculoskeletal: She exhibits no edema or tenderness.  Lymphadenopathy:    She has no cervical adenopathy.  Neurological: She displays normal reflexes. No cranial nerve deficit. She exhibits normal muscle tone. Coordination normal.  Skin: No rash noted. No erythema.  Psychiatric: She has a normal mood and affect. Her behavior is normal. Judgment and thought content normal.     Lab Results  Component Value Date   WBC 4.1 01/28/2015   HGB 11.1* 01/28/2015   HCT 32.2* 01/28/2015   PLT 225 01/28/2015   GLUCOSE 147* 01/28/2015   CHOL 152 11/05/2014   TRIG 189.0* 11/05/2014   HDL 45.00 11/05/2014   LDLDIRECT 77.3 10/26/2012   LDLCALC 69 11/05/2014   ALT 24 01/28/2015   AST 14 01/28/2015   NA 130* 01/28/2015   K 3.9 01/28/2015   CL 102 12/26/2014   CREATININE 1.9* 01/28/2015   BUN 30.8* 01/28/2015   CO2 19* 01/28/2015   TSH 1.49 11/05/2014  INR 1.0 11/05/2014   HGBA1C 6.4 11/05/2014    EKG no new changes      Assessment & Plan:

## 2015-01-29 NOTE — Assessment & Plan Note (Signed)
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 Neoadjuvant chemotherapy with TCH Perjeta 6 cycles followed by Herceptin maintenance 2. After initial chemotherapy repeat MRI and then surgery 3. Adjuvant radiation therapy may be a consideration 4. Followed by antiestrogen therapy  Current Plan: TCH-Perjeta cycle 2 Day 1 ECHO 12/27/14: EF 60-65%

## 2015-01-30 ENCOUNTER — Ambulatory Visit (HOSPITAL_BASED_OUTPATIENT_CLINIC_OR_DEPARTMENT_OTHER): Payer: Managed Care, Other (non HMO)

## 2015-01-30 ENCOUNTER — Other Ambulatory Visit: Payer: Self-pay | Admitting: *Deleted

## 2015-01-30 VITALS — BP 130/43 | HR 74 | Temp 98.1°F | Resp 18

## 2015-01-30 DIAGNOSIS — R531 Weakness: Secondary | ICD-10-CM | POA: Diagnosis not present

## 2015-01-30 DIAGNOSIS — C50412 Malignant neoplasm of upper-outer quadrant of left female breast: Secondary | ICD-10-CM | POA: Diagnosis not present

## 2015-01-30 DIAGNOSIS — K521 Toxic gastroenteritis and colitis: Secondary | ICD-10-CM

## 2015-01-30 MED ORDER — SODIUM CHLORIDE 0.9 % IV SOLN
Freq: Once | INTRAVENOUS | Status: AC
  Administered 2015-01-30: 14:00:00 via INTRAVENOUS
  Filled 2015-01-30: qty 8

## 2015-01-30 MED ORDER — SODIUM CHLORIDE 0.9 % IV SOLN
INTRAVENOUS | Status: AC
  Administered 2015-01-30: 14:00:00 via INTRAVENOUS

## 2015-01-30 MED ORDER — HEPARIN SOD (PORK) LOCK FLUSH 100 UNIT/ML IV SOLN
500.0000 [IU] | Freq: Once | INTRAVENOUS | Status: AC
  Administered 2015-01-30: 500 [IU] via INTRAVENOUS
  Filled 2015-01-30: qty 5

## 2015-01-30 MED ORDER — SODIUM CHLORIDE 0.9 % IJ SOLN
10.0000 mL | INTRAMUSCULAR | Status: DC | PRN
  Administered 2015-01-30: 10 mL via INTRAVENOUS
  Filled 2015-01-30: qty 10

## 2015-01-30 NOTE — Assessment & Plan Note (Signed)
Hold BP meds if diarrhea

## 2015-01-30 NOTE — Patient Instructions (Signed)
Dehydration, Adult Dehydration is when you lose more fluids from the body than you take in. Vital organs like the kidneys, brain, and heart cannot function without a proper amount of fluids and salt. Any loss of fluids from the body can cause dehydration.  CAUSES   Vomiting.  Diarrhea.  Excessive sweating.  Excessive urine output.  Fever. SYMPTOMS  Mild dehydration  Thirst.  Dry lips.  Slightly dry mouth. Moderate dehydration  Very dry mouth.  Sunken eyes.  Skin does not bounce back quickly when lightly pinched and released.  Dark urine and decreased urine production.  Decreased tear production.  Headache. Severe dehydration  Very dry mouth.  Extreme thirst.  Rapid, weak pulse (more than 100 beats per minute at rest).  Cold hands and feet.  Not able to sweat in spite of heat and temperature.  Rapid breathing.  Blue lips.  Confusion and lethargy.  Difficulty being awakened.  Minimal urine production.  No tears. DIAGNOSIS  Your caregiver will diagnose dehydration based on your symptoms and your exam. Blood and urine tests will help confirm the diagnosis. The diagnostic evaluation should also identify the cause of dehydration. TREATMENT  Treatment of mild or moderate dehydration can often be done at home by increasing the amount of fluids that you drink. It is best to drink small amounts of fluid more often. Drinking too much at one time can make vomiting worse. Refer to the home care instructions below. Severe dehydration needs to be treated at the hospital where you will probably be given intravenous (IV) fluids that contain water and electrolytes. HOME CARE INSTRUCTIONS   Ask your caregiver about specific rehydration instructions.  Drink enough fluids to keep your urine clear or pale yellow.  Drink small amounts frequently if you have nausea and vomiting.  Eat as you normally do.  Avoid:  Foods or drinks high in sugar.  Carbonated  drinks.  Juice.  Extremely hot or cold fluids.  Drinks with caffeine.  Fatty, greasy foods.  Alcohol.  Tobacco.  Overeating.  Gelatin desserts.  Wash your hands well to avoid spreading bacteria and viruses.  Only take over-the-counter or prescription medicines for pain, discomfort, or fever as directed by your caregiver.  Ask your caregiver if you should continue all prescribed and over-the-counter medicines.  Keep all follow-up appointments with your caregiver. SEEK MEDICAL CARE IF:  You have abdominal pain and it increases or stays in one area (localizes).  You have a rash, stiff neck, or severe headache.  You are irritable, sleepy, or difficult to awaken.  You are weak, dizzy, or extremely thirsty. SEEK IMMEDIATE MEDICAL CARE IF:   You are unable to keep fluids down or you get worse despite treatment.  You have frequent episodes of vomiting or diarrhea.  You have blood or green matter (bile) in your vomit.  You have blood in your stool or your stool looks black and tarry.  You have not urinated in 6 to 8 hours, or you have only urinated a small amount of very dark urine.  You have a fever.  You faint. MAKE SURE YOU:   Understand these instructions.  Will watch your condition.  Will get help right away if you are not doing well or get worse. Document Released: 07/27/2005 Document Revised: 10/19/2011 Document Reviewed: 03/16/2011 Exit[employer] Patient Information 2015 [employer]. This information is not intended to replace advice given to you by your health care provider. Make sure you discuss any questions you have with your health care   provider.  

## 2015-01-31 ENCOUNTER — Other Ambulatory Visit: Payer: Self-pay | Admitting: *Deleted

## 2015-01-31 MED ORDER — POTASSIUM CHLORIDE ER 10 MEQ PO CPCR: 10.0000 meq | ORAL_CAPSULE | Freq: Every day | ORAL | Status: DC

## 2015-02-02 ENCOUNTER — Ambulatory Visit (HOSPITAL_BASED_OUTPATIENT_CLINIC_OR_DEPARTMENT_OTHER): Payer: Managed Care, Other (non HMO)

## 2015-02-02 VITALS — BP 121/71 | HR 81 | Temp 98.5°F | Resp 18

## 2015-02-02 DIAGNOSIS — C50412 Malignant neoplasm of upper-outer quadrant of left female breast: Secondary | ICD-10-CM | POA: Diagnosis not present

## 2015-02-02 MED ORDER — SODIUM CHLORIDE 0.9 % IV SOLN
Freq: Once | INTRAVENOUS | Status: AC
  Administered 2015-02-02: 11:00:00 via INTRAVENOUS

## 2015-02-02 MED ORDER — SODIUM CHLORIDE 0.9 % IJ SOLN
10.0000 mL | INTRAMUSCULAR | Status: AC | PRN
  Administered 2015-02-02: 10 mL
  Filled 2015-02-02: qty 10

## 2015-02-02 MED ORDER — SODIUM CHLORIDE 0.9 % IV SOLN
INTRAVENOUS | Status: AC
  Administered 2015-02-02: 11:00:00 via INTRAVENOUS

## 2015-02-02 MED ORDER — HEPARIN SOD (PORK) LOCK FLUSH 100 UNIT/ML IV SOLN
500.0000 [IU] | INTRAVENOUS | Status: AC | PRN
  Administered 2015-02-02: 500 [IU]
  Filled 2015-02-02: qty 5

## 2015-02-02 NOTE — Patient Instructions (Signed)

## 2015-02-02 NOTE — Progress Notes (Signed)
Patient is doing better with nausea today.  She has not taken any nausea medication at home today and would like some zofran through her IV.

## 2015-02-04 ENCOUNTER — Encounter: Payer: Self-pay | Admitting: *Deleted

## 2015-02-05 ENCOUNTER — Telehealth: Payer: Self-pay | Admitting: Hematology and Oncology

## 2015-02-05 ENCOUNTER — Telehealth: Payer: Self-pay | Admitting: *Deleted

## 2015-02-05 NOTE — Telephone Encounter (Signed)
Called patient and she is aware of her added chemo

## 2015-02-05 NOTE — Telephone Encounter (Signed)
Per staff message and POF I have scheduled appts. Advised scheduler of appts. JMW  

## 2015-02-14 ENCOUNTER — Encounter: Payer: Self-pay | Admitting: Hematology and Oncology

## 2015-02-14 NOTE — Progress Notes (Signed)
I placed fmla form on desk on nurse of dr. Lindi Adie

## 2015-02-17 NOTE — Assessment & Plan Note (Signed)
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 Neoadjuvant chemotherapy with [facility]CH Perjeta 6 cycles followed by Herceptin maintenance 2. After initial chemotherapy repeat MRI and then surgery 3. Adjuvant radiation therapy may be a consideration 4. Followed by antiestrogen therapy  Current Plan: TCH-Perjeta cycle 2 Day 8 (plan to decrease chemotherapy dosage as well as discontinue Perjeta from next cycle) ECHO 12/27/14: EF 60-65%  Toxicities: 1. Diarrhea: Lucrezia Starch has been helpful but not completely resolved her diarrhea. She still goes 4-5 times a day. Because of intractable diarrhea and renal failure, I will discontinue Perjeta from the next cycle. 2. Mucositis: magic mouthwash (without lidocaine) to be swished and swallowed QID PRN 3. Nausea: Nausea is better than the first cycle of chemotherapy but she continues to have multiple episodes of nausea and vomiting every day. Because of her nausea issues, we will decrease the dosage of chemotherapy. 4. Fatigue: Getting worse with each cycle of chemotherapy 5. Elevated BP during/after taxotere. Returned to baseline after clonidine administered.  6. Skin rash: Very profound. Improves with Benadryl. 7. Tremors 8. Renal failure: Prev Cr 1.9. Received IV fluids with prev cycles  RTC in 3 weeks for follow up

## 2015-02-18 ENCOUNTER — Telehealth: Payer: Self-pay | Admitting: Hematology and Oncology

## 2015-02-18 ENCOUNTER — Other Ambulatory Visit (HOSPITAL_BASED_OUTPATIENT_CLINIC_OR_DEPARTMENT_OTHER): Payer: Managed Care, Other (non HMO)

## 2015-02-18 ENCOUNTER — Ambulatory Visit (HOSPITAL_BASED_OUTPATIENT_CLINIC_OR_DEPARTMENT_OTHER): Payer: Managed Care, Other (non HMO)

## 2015-02-18 ENCOUNTER — Encounter: Payer: Self-pay | Admitting: Hematology and Oncology

## 2015-02-18 ENCOUNTER — Ambulatory Visit (HOSPITAL_BASED_OUTPATIENT_CLINIC_OR_DEPARTMENT_OTHER): Payer: Managed Care, Other (non HMO) | Admitting: Hematology and Oncology

## 2015-02-18 VITALS — BP 158/59 | HR 78 | Temp 98.9°F | Resp 18 | Ht 62.0 in | Wt 224.7 lb

## 2015-02-18 DIAGNOSIS — R197 Diarrhea, unspecified: Secondary | ICD-10-CM

## 2015-02-18 DIAGNOSIS — D6481 Anemia due to antineoplastic chemotherapy: Secondary | ICD-10-CM

## 2015-02-18 DIAGNOSIS — K1231 Oral mucositis (ulcerative) due to antineoplastic therapy: Secondary | ICD-10-CM

## 2015-02-18 DIAGNOSIS — C50412 Malignant neoplasm of upper-outer quadrant of left female breast: Secondary | ICD-10-CM

## 2015-02-18 DIAGNOSIS — Z5112 Encounter for antineoplastic immunotherapy: Secondary | ICD-10-CM

## 2015-02-18 DIAGNOSIS — R030 Elevated blood-pressure reading, without diagnosis of hypertension: Secondary | ICD-10-CM

## 2015-02-18 DIAGNOSIS — R251 Tremor, unspecified: Secondary | ICD-10-CM

## 2015-02-18 DIAGNOSIS — Z5111 Encounter for antineoplastic chemotherapy: Secondary | ICD-10-CM

## 2015-02-18 DIAGNOSIS — R110 Nausea: Secondary | ICD-10-CM

## 2015-02-18 DIAGNOSIS — R530 Neoplastic (malignant) related fatigue: Secondary | ICD-10-CM

## 2015-02-18 LAB — CBC WITH DIFFERENTIAL/PLATELET
BASO%: 0.8 % (ref 0.0–2.0)
BASOS ABS: 0 10*3/uL (ref 0.0–0.1)
EOS ABS: 0 10*3/uL (ref 0.0–0.5)
EOS%: 0 % (ref 0.0–7.0)
HCT: 31.1 % — ABNORMAL LOW (ref 34.8–46.6)
HGB: 10.6 g/dL — ABNORMAL LOW (ref 11.6–15.9)
LYMPH%: 32.2 % (ref 14.0–49.7)
MCH: 33 pg (ref 25.1–34.0)
MCHC: 34.2 g/dL (ref 31.5–36.0)
MCV: 96.7 fL (ref 79.5–101.0)
MONO#: 0.3 10*3/uL (ref 0.1–0.9)
MONO%: 5.1 % (ref 0.0–14.0)
NEUT#: 3.6 10*3/uL (ref 1.5–6.5)
NEUT%: 61.9 % (ref 38.4–76.8)
PLATELETS: 394 10*3/uL (ref 145–400)
RBC: 3.22 10*6/uL — ABNORMAL LOW (ref 3.70–5.45)
RDW: 19 % — ABNORMAL HIGH (ref 11.2–14.5)
WBC: 5.8 10*3/uL (ref 3.9–10.3)
lymph#: 1.9 10*3/uL (ref 0.9–3.3)

## 2015-02-18 LAB — COMPREHENSIVE METABOLIC PANEL (CC13)
ALBUMIN: 3.1 g/dL — AB (ref 3.5–5.0)
ALK PHOS: 74 U/L (ref 40–150)
ALT: 11 U/L (ref 0–55)
ANION GAP: 14 meq/L — AB (ref 3–11)
AST: 9 U/L (ref 5–34)
BUN: 12.6 mg/dL (ref 7.0–26.0)
CHLORIDE: 109 meq/L (ref 98–109)
CO2: 20 mEq/L — ABNORMAL LOW (ref 22–29)
Calcium: 8.8 mg/dL (ref 8.4–10.4)
Creatinine: 1.1 mg/dL (ref 0.6–1.1)
EGFR: 54 mL/min/{1.73_m2} — ABNORMAL LOW (ref >90)
GLUCOSE: 196 mg/dL — AB (ref 70–140)
POTASSIUM: 3.1 meq/L — AB (ref 3.5–5.1)
Sodium: 144 mEq/L (ref 136–145)
Total Bilirubin: 0.29 mg/dL (ref 0.20–1.20)
Total Protein: 6.4 g/dL (ref 6.4–8.3)

## 2015-02-18 MED ORDER — HEPARIN SOD (PORK) LOCK FLUSH 100 UNIT/ML IV SOLN
500.0000 [IU] | Freq: Once | INTRAVENOUS | Status: AC | PRN
  Administered 2015-02-18: 500 [IU]
  Filled 2015-02-18: qty 5

## 2015-02-18 MED ORDER — PEGFILGRASTIM 6 MG/0.6ML ~~LOC~~ PSKT
6.0000 mg | PREFILLED_SYRINGE | Freq: Once | SUBCUTANEOUS | Status: AC
  Administered 2015-02-18: 6 mg via SUBCUTANEOUS
  Filled 2015-02-18: qty 0.6

## 2015-02-18 MED ORDER — TRASTUZUMAB CHEMO INJECTION 440 MG
6.0000 mg/kg | Freq: Once | INTRAVENOUS | Status: AC
  Administered 2015-02-18: 630 mg via INTRAVENOUS
  Filled 2015-02-18: qty 30

## 2015-02-18 MED ORDER — SODIUM CHLORIDE 0.9 % IV SOLN
517.5000 mg | Freq: Once | INTRAVENOUS | Status: AC
  Administered 2015-02-18: 520 mg via INTRAVENOUS
  Filled 2015-02-18: qty 52

## 2015-02-18 MED ORDER — DOCETAXEL CHEMO INJECTION 160 MG/16ML
60.0000 mg/m2 | Freq: Once | INTRAVENOUS | Status: AC
  Administered 2015-02-18: 130 mg via INTRAVENOUS
  Filled 2015-02-18: qty 13

## 2015-02-18 MED ORDER — SODIUM CHLORIDE 0.9 % IV SOLN
Freq: Once | INTRAVENOUS | Status: AC
  Administered 2015-02-18: 11:00:00 via INTRAVENOUS
  Filled 2015-02-18: qty 5

## 2015-02-18 MED ORDER — ACETAMINOPHEN 325 MG PO TABS
ORAL_TABLET | ORAL | Status: AC
  Filled 2015-02-18: qty 2

## 2015-02-18 MED ORDER — TERAZOSIN HCL 5 MG PO CAPS: 5.0000 mg | ORAL_CAPSULE | Freq: Every day | ORAL | Status: DC

## 2015-02-18 MED ORDER — DIPHENHYDRAMINE HCL 25 MG PO CAPS
ORAL_CAPSULE | ORAL | Status: AC
  Filled 2015-02-18: qty 2

## 2015-02-18 MED ORDER — ACETAMINOPHEN 325 MG PO TABS
650.0000 mg | ORAL_TABLET | Freq: Once | ORAL | Status: AC
  Administered 2015-02-18: 650 mg via ORAL

## 2015-02-18 MED ORDER — PALONOSETRON HCL INJECTION 0.25 MG/5ML
0.2500 mg | Freq: Once | INTRAVENOUS | Status: AC
  Administered 2015-02-18: 0.25 mg via INTRAVENOUS

## 2015-02-18 MED ORDER — SODIUM CHLORIDE 0.9 % IV SOLN: Freq: Once | INTRAVENOUS | Status: DC

## 2015-02-18 MED ORDER — SODIUM CHLORIDE 0.9 % IV SOLN
Freq: Once | INTRAVENOUS | Status: AC
  Administered 2015-02-18: 10:00:00 via INTRAVENOUS

## 2015-02-18 MED ORDER — PALONOSETRON HCL INJECTION 0.25 MG/5ML
INTRAVENOUS | Status: AC
  Filled 2015-02-18: qty 5

## 2015-02-18 MED ORDER — DIPHENHYDRAMINE HCL 25 MG PO CAPS
50.0000 mg | ORAL_CAPSULE | Freq: Once | ORAL | Status: AC
  Administered 2015-02-18: 50 mg via ORAL

## 2015-02-18 MED ORDER — SODIUM CHLORIDE 0.9 % IJ SOLN
10.0000 mL | INTRAMUSCULAR | Status: DC | PRN
  Administered 2015-02-18: 10 mL
  Filled 2015-02-18: qty 10

## 2015-02-18 NOTE — Progress Notes (Signed)
Patient Care Team: [name], MD as PCP - General  DIAGNOSIS: No matching staging information was found for the patient.  SUMMARY OF ONCOLOGIC HISTORY:   Breast cancer of upper-outer quadrant of left female breast   12/07/2014 Initial Diagnosis Left breast 2:00 position: Invasive ductal carcinoma grade 2, ER 5%, PR 0%, Ki-67 80%, HER-2 positive ratio 6.96   12/07/2014 Mammogram Large left breast mass 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, 2 small benign nodules in the right breast stable since 2002, other consistent with normal intramammary lymph node left axilla ultrasound normal-sized lymph nodes   12/31/2014 -  Neo-Adjuvant Chemotherapy TCH Perjeta neoadjuvant chemotherapy 6    CHIEF COMPLIANT: Cycle 3 NeoAdjuvant chemotherapy TCH, Perjeta discontinued  INTERVAL HISTORY: Savannah Howard is a 69 year old with above-mentioned history left breast cancer currently on neo-adjuvant chemotherapy with [place]. Because of intractable diarrhea Perjeta was discontinued and she is currently receiving TCH alone since cycle 2. She did extremely well with the last cycle with improvement in her diarrhea, nausea, fatigue. She is still using wheelchair but is able to do a lot more activities than before. Taste is still the same. Denies any neuropathy.  REVIEW OF SYSTEMS:   Constitutional: Denies fevers, chills or abnormal weight loss Eyes: Denies blurriness of vision Ears, nose, mouth, throat, and face: Denies mucositis or sore throat Respiratory: Denies cough, dyspnea or wheezes Cardiovascular: Denies palpitation, chest discomfort or lower extremity swelling Gastrointestinal:  Mild nausea Skin: Denies abnormal skin rashes Lymphatics: Denies new lymphadenopathy or easy bruising Neurological:Denies numbness, tingling or new weaknesses Behavioral/Psych: Mood is stable, no new changes  All other systems were reviewed with the patient and are negative.  I have reviewed the past medical history, past surgical  history, social history and family history with the patient and they are unchanged from previous note.  ALLERGIES:  is allergic to penicillins and sulfonamide derivatives.  MEDICATIONS:  Current Outpatient Prescriptions  Medication Sig Dispense Refill  . Alum & Mag Hydroxide-Simeth (MAGIC MOUTHWASH) SOLN Take 5 mLs by mouth 4 (four) times daily. 5 mL 1  . atorvastatin (LIPITOR) 10 MG tablet Take 1 tablet (10 mg total) by mouth daily. 90 tablet 3  . cholecalciferol (VITAMIN D) 1000 UNITS tablet Take 1,000 Units by mouth daily.      . cholestyramine (QUESTRAN) 4 G packet Take 1 packet (4 g total) by mouth 3 (three) times daily with meals. 60 each 2  . cyanocobalamin (,VITAMIN B-12,) 1000 MCG/ML injection Inject 1 mL (1,000 mcg total) into the muscle every 30 (thirty) days. 3 mL 3  . dexamethasone (DECADRON) 4 MG tablet Take 1 tablet (4 mg total) by mouth 2 (two) times daily. Start the day before Taxotere. Then again the day after chemo for 3 days. 30 tablet 1  . diphenoxylate-atropine (LOMOTIL) 2.5-0.025 MG per tablet Take 1 tablet by mouth 4 (four) times daily as needed for diarrhea or loose stools. 60 tablet 1  . folic acid (FOLVITE) 1 MG tablet Take 1 mg by mouth daily.      Marland Kitchen lidocaine-prilocaine (EMLA) cream Apply to affected area once 30 g 3  . LORazepam (ATIVAN) 0.5 MG tablet Take 1 tablet (0.5 mg total) by mouth 3 (three) times daily as needed. for anxiety 270 tablet 1  . losartan-hydrochlorothiazide (HYZAAR) 100-25 MG per tablet Take 1 tablet by mouth daily. 90 tablet 3  . metoprolol succinate (TOPROL-XL) 50 MG 24 hr tablet Take 1 tablet (50 mg total) by mouth 2 (two) times  daily. Take with or immediately following a meal. 180 tablet 2  . nystatin (MYCOSTATIN) 100000 UNIT/ML suspension     . omeprazole (PRILOSEC) 20 MG capsule TAKE ONE CAPSULE BY MOUTH ONCE DAILY 90 capsule 1  . ondansetron (ZOFRAN) 8 MG tablet Take 1 tablet (8 mg total) by mouth 2 (two) times daily. Start the day after  chemo for 3 days. Then take as needed for nausea or vomiting. 30 tablet 1  . oxyCODONE-acetaminophen (ROXICET) 5-325 MG per tablet Take 1-2 tablets by mouth every 4 (four) hours as needed. 50 tablet 0  . potassium chloride (MICRO-K) 10 MEQ CR capsule Take 1 capsule (10 mEq total) by mouth daily. 90 capsule 3  . prochlorperazine (COMPAZINE) 10 MG tablet Take 1 tablet (10 mg total) by mouth every 6 (six) hours as needed (Nausea or vomiting). 30 tablet 1  . terazosin (HYTRIN) 5 MG capsule Take 1 capsule (5 mg total) by mouth at bedtime. 90 capsule 3   No current facility-administered medications for this visit.    PHYSICAL EXAMINATION: ECOG PERFORMANCE STATUS: 1 - Symptomatic but completely ambulatory  Filed Vitals:   02/18/15 0823  BP: 158/59  Pulse: 78  Temp: 98.9 F (37.2 C)  Resp: 18   Filed Weights   02/18/15 0823  Weight: 224 lb 11.2 oz (101.923 kg)    GENERAL:alert, no distress and comfortable SKIN: skin color, texture, turgor are normal, no rashes or significant lesions EYES: normal, Conjunctiva are pink and non-injected, sclera clear OROPHARYNX:no exudate, no erythema and lips, buccal mucosa, and tongue normal  NECK: supple, thyroid normal size, non-tender, without nodularity LYMPH:  no palpable lymphadenopathy in the cervical, axillary or inguinal LUNGS: clear to auscultation and percussion with normal breathing effort HEART: regular rate & rhythm and no murmurs and no lower extremity edema ABDOMEN:abdomen soft, non-tender and normal bowel sounds Musculoskeletal:no cyanosis of digits and no clubbing  NEURO: alert & oriented x 3 with fluent speech, no focal motor/sensory deficits  LABORATORY DATA:  I have reviewed the data as listed   Chemistry      Component Value Date/Time   NA 130* 01/28/2015 1321   NA 138 12/26/2014 0755   K 3.9 01/28/2015 1321   K 5.7* 12/26/2014 0755   CL 102 12/26/2014 0755   CO2 19* 01/28/2015 1321   CO2 28 11/05/2014 1456   BUN 30.8*  01/28/2015 1321   BUN 45* 12/26/2014 0755   CREATININE 1.9* 01/28/2015 1321   CREATININE 1.00 12/26/2014 0755      Component Value Date/Time   CALCIUM 9.2 01/28/2015 1321   CALCIUM 9.8 11/05/2014 1456   ALKPHOS 67 01/28/2015 1321   ALKPHOS 68 11/05/2014 1456   AST 14 01/28/2015 1321   AST 15 11/05/2014 1456   ALT 24 01/28/2015 1321   ALT 14 11/05/2014 1456   BILITOT 0.57 01/28/2015 1321   BILITOT 0.4 11/05/2014 1456       Lab Results  Component Value Date   WBC 5.8 02/18/2015   HGB 10.6* 02/18/2015   HCT 31.1* 02/18/2015   MCV 96.7 02/18/2015   PLT 394 02/18/2015   NEUTROABS 3.6 02/18/2015   ASSESSMENT & PLAN:  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 Neoadjuvant chemotherapy with [place] Perjeta 6  cycles followed by Herceptin maintenance 2. After initial chemotherapy repeat MRI and then surgery 3. Adjuvant radiation therapy may be a consideration 4. Followed by antiestrogen therapy  Current Plan: TCH-Perjeta cycle 3 Day 1 (decreased chemotherapy dosage with cycle 2 and discontinued Perjeta for diarrhea) ECHO 12/27/14: EF 60-65%  Toxicities: 1. Diarrhea: Marked improvement after Perjeta was discontinued. 2. Mucositis: magic mouthwash (without lidocaine) to be swished and swallowed QID PRN 3. Nausea: Much improved with decrease in the dosage of chemotherapy 4. Fatigue: Getting worse with each cycle of chemotherapy 5. Elevated BP during/after taxotere. Renewed her prescription for antihypertensive med  6. Skin rash: Very profound. Resolved since stopping Perjeta 7. Tremors 8. Renal failure: IV fluids 1 week after chemotherapy 9. Anemia normocytic related to chemotherapy being observed  RTC in 3 weeks for follow up   No orders of the defined  types were placed in this encounter.   The patient has a good understanding of the overall plan. she agrees with it. she will call with any problems that may develop before the next visit here.   Rulon Eisenmenger, MD

## 2015-02-18 NOTE — Telephone Encounter (Signed)
Gave avs & calendar for July/August. °

## 2015-02-18 NOTE — Progress Notes (Signed)
Potasium 3.1, Dr. Lindi Adie aware. Per Dr. Lindi Adie pt is to increase potassium at home from 10 MEQ to 20 MEQ daily. Pt aware and verbalizes understanding.

## 2015-02-18 NOTE — Patient Instructions (Signed)
[facility] Discharge Instructions for Patients Receiving Chemotherapy  Today you received the following chemotherapy agents: Taxotere, Carboplatin, Herceptin   To help prevent nausea and vomiting after your treatment, we encourage you to take your nausea medication as directed.    If you develop nausea and vomiting that is not controlled by your nausea medication, call the clinic.   BELOW ARE SYMPTOMS THAT SHOULD BE REPORTED IMMEDIATELY:  *FEVER GREATER THAN 100.5 F  *CHILLS WITH OR WITHOUT FEVER  NAUSEA AND VOMITING THAT IS NOT CONTROLLED WITH YOUR NAUSEA MEDICATION  *UNUSUAL SHORTNESS OF BREATH  *UNUSUAL BRUISING OR BLEEDING  TENDERNESS IN MOUTH AND THROAT WITH OR WITHOUT PRESENCE OF ULCERS  *URINARY PROBLEMS  *BOWEL PROBLEMS  UNUSUAL RASH Items with * indicate a potential emergency and should be followed up as soon as possible.  Feel free to call the clinic you have any questions or concerns. The clinic phone number is [phone].  Please show the [facility] at check-in to the Emergency Department and triage nurse.   Pegfilgrastim injection What is this medicine? PEGFILGRASTIM (peg fil GRA stim) is a long-acting granulocyte colony-stimulating factor that stimulates the growth of neutrophils, a type of white blood cell important in the body's fight against infection. It is used to reduce the incidence of fever and infection in patients with certain types of cancer who are receiving chemotherapy that affects the bone marrow. This medicine may be used for other purposes; ask your health care provider or pharmacist if you have questions. COMMON BRAND NAME(S): Neulasta What should I tell my health care provider before I take this medicine? They need to know if you have any of these conditions: -latex allergy -ongoing radiation therapy -sickle cell disease -skin reactions to acrylic adhesives (On-Body Injector only) -an unusual or allergic reaction  to pegfilgrastim, filgrastim, other medicines, foods, dyes, or preservatives -pregnant or trying to get pregnant -breast-feeding How should I use this medicine? This medicine is for injection under the skin. If you get this medicine at home, you will be taught how to prepare and give the pre-filled syringe or how to use the On-body Injector. Refer to the patient Instructions for Use for detailed instructions. Use exactly as directed. Take your medicine at regular intervals. Do not take your medicine more often than directed. It is important that you put your used needles and syringes in a special sharps container. Do not put them in a trash can. If you do not have a sharps container, call your pharmacist or healthcare provider to get one. Talk to your pediatrician regarding the use of this medicine in children. Special care may be needed. Overdosage: If you think you have taken too much of this medicine contact a poison control center or emergency room at once. NOTE: This medicine is only for you. Do not share this medicine with others. What if I miss a dose? It is important not to miss your dose. Call your doctor or health care professional if you miss your dose. If you miss a dose due to an On-body Injector failure or leakage, a new dose should be administered as soon as possible using a single prefilled syringe for manual use. What may interact with this medicine? Interactions have not been studied. Give your health care provider a list of all the medicines, herbs, non-prescription drugs, or dietary supplements you use. Also tell them if you smoke, drink alcohol, or use illegal drugs. Some items may interact with your medicine. This list  may not describe all possible interactions. Give your health care provider a list of all the medicines, herbs, non-prescription drugs, or dietary supplements you use. Also tell them if you smoke, drink alcohol, or use illegal drugs. Some items may interact with your  medicine. What should I watch for while using this medicine? You may need blood work done while you are taking this medicine. If you are going to need a MRI, CT scan, or other procedure, tell your doctor that you are using this medicine (On-Body Injector only). What side effects may I notice from receiving this medicine? Side effects that you should report to your doctor or health care professional as soon as possible: -allergic reactions like skin rash, itching or hives, swelling of the face, lips, or tongue -dizziness -fever -pain, redness, or irritation at site where injected -pinpoint red spots on the skin -shortness of breath or breathing problems -stomach or side pain, or pain at the shoulder -swelling -tiredness -trouble passing urine Side effects that usually do not require medical attention (report to your doctor or health care professional if they continue or are bothersome): -bone pain -muscle pain This list may not describe all possible side effects. Call your doctor for medical advice about side effects. You may report side effects to FDA at 1-800-FDA-1088. Where should I keep my medicine? Keep out of the reach of children. Store pre-filled syringes in a refrigerator between 2 and 8 degrees C (36 and 46 degrees F). Do not freeze. Keep in carton to protect from light. Throw away this medicine if it is left out of the refrigerator for more than 48 hours. Throw away any unused medicine after the expiration date. NOTE: This sheet is a summary. It may not cover all possible information. If you have questions about this medicine, talk to your doctor, pharmacist, or health care provider.  2015, [employer]. (2013-10-26 16:14:05)

## 2015-02-19 ENCOUNTER — Encounter: Payer: Self-pay | Admitting: Hematology and Oncology

## 2015-02-19 NOTE — Progress Notes (Signed)
[place] fmla form was faxed to  [phone] 1987 by Terri. I left copy for patient record at front desk with ms. Wilma to pick up at her next visit.

## 2015-02-25 ENCOUNTER — Other Ambulatory Visit: Payer: Self-pay

## 2015-02-25 ENCOUNTER — Other Ambulatory Visit: Payer: Self-pay | Admitting: Hematology and Oncology

## 2015-02-25 ENCOUNTER — Ambulatory Visit (HOSPITAL_BASED_OUTPATIENT_CLINIC_OR_DEPARTMENT_OTHER): Payer: Managed Care, Other (non HMO)

## 2015-02-25 VITALS — BP 151/54 | HR 75 | Temp 97.9°F | Resp 16

## 2015-02-25 DIAGNOSIS — R197 Diarrhea, unspecified: Secondary | ICD-10-CM

## 2015-02-25 DIAGNOSIS — K521 Toxic gastroenteritis and colitis: Secondary | ICD-10-CM

## 2015-02-25 DIAGNOSIS — C50412 Malignant neoplasm of upper-outer quadrant of left female breast: Secondary | ICD-10-CM

## 2015-02-25 DIAGNOSIS — R5383 Other fatigue: Secondary | ICD-10-CM | POA: Diagnosis not present

## 2015-02-25 DIAGNOSIS — T451X5A Adverse effect of antineoplastic and immunosuppressive drugs, initial encounter: Secondary | ICD-10-CM

## 2015-02-25 DIAGNOSIS — R110 Nausea: Secondary | ICD-10-CM | POA: Diagnosis not present

## 2015-02-25 DIAGNOSIS — E860 Dehydration: Secondary | ICD-10-CM

## 2015-02-25 MED ORDER — HEPARIN SOD (PORK) LOCK FLUSH 100 UNIT/ML IV SOLN
500.0000 [IU] | Freq: Once | INTRAVENOUS | Status: AC | PRN
  Administered 2015-02-25: 500 [IU]
  Filled 2015-02-25: qty 5

## 2015-02-25 MED ORDER — SODIUM CHLORIDE 0.9 % IJ SOLN
10.0000 mL | INTRAMUSCULAR | Status: DC | PRN
Start: 2015-02-25 — End: 2015-02-25
  Administered 2015-02-25: 10 mL
  Filled 2015-02-25: qty 10

## 2015-02-25 MED ORDER — SODIUM CHLORIDE 0.9 % IV SOLN
Freq: Once | INTRAVENOUS | Status: AC
  Administered 2015-02-25: 08:00:00 via INTRAVENOUS

## 2015-02-25 NOTE — Progress Notes (Signed)
Pt states she does not need nausea medication at this time. Pt knows to notify RN if she decides she would like something before IVF are finished.

## 2015-02-25 NOTE — Patient Instructions (Signed)
Dehydration, Adult Dehydration is when you lose more fluids from the body than you take in. Vital organs like the kidneys, brain, and heart cannot function without a proper amount of fluids and salt. Any loss of fluids from the body can cause dehydration.  CAUSES   Vomiting.  Diarrhea.  Excessive sweating.  Excessive urine output.  Fever. SYMPTOMS  Mild dehydration  Thirst.  Dry lips.  Slightly dry mouth. Moderate dehydration  Very dry mouth.  Sunken eyes.  Skin does not bounce back quickly when lightly pinched and released.  Dark urine and decreased urine production.  Decreased tear production.  Headache. Severe dehydration  Very dry mouth.  Extreme thirst.  Rapid, weak pulse (more than 100 beats per minute at rest).  Cold hands and feet.  Not able to sweat in spite of heat and temperature.  Rapid breathing.  Blue lips.  Confusion and lethargy.  Difficulty being awakened.  Minimal urine production.  No tears. DIAGNOSIS  Your caregiver will diagnose dehydration based on your symptoms and your exam. Blood and urine tests will help confirm the diagnosis. The diagnostic evaluation should also identify the cause of dehydration. TREATMENT  Treatment of mild or moderate dehydration can often be done at home by increasing the amount of fluids that you drink. It is best to drink small amounts of fluid more often. Drinking too much at one time can make vomiting worse. Refer to the home care instructions below. Severe dehydration needs to be treated at the hospital where you will probably be given intravenous (IV) fluids that contain water and electrolytes. HOME CARE INSTRUCTIONS   Ask your caregiver about specific rehydration instructions.  Drink enough fluids to keep your urine clear or pale yellow.  Drink small amounts frequently if you have nausea and vomiting.  Eat as you normally do.  Avoid:  Foods or drinks high in sugar.  Carbonated  drinks.  Juice.  Extremely hot or cold fluids.  Drinks with caffeine.  Fatty, greasy foods.  Alcohol.  Tobacco.  Overeating.  Gelatin desserts.  Wash your hands well to avoid spreading bacteria and viruses.  Only take over-the-counter or prescription medicines for pain, discomfort, or fever as directed by your caregiver.  Ask your caregiver if you should continue all prescribed and over-the-counter medicines.  Keep all follow-up appointments with your caregiver. SEEK MEDICAL CARE IF:  You have abdominal pain and it increases or stays in one area (localizes).  You have a rash, stiff neck, or severe headache.  You are irritable, sleepy, or difficult to awaken.  You are weak, dizzy, or extremely thirsty. SEEK IMMEDIATE MEDICAL CARE IF:   You are unable to keep fluids down or you get worse despite treatment.  You have frequent episodes of vomiting or diarrhea.  You have blood or green matter (bile) in your vomit.  You have blood in your stool or your stool looks black and tarry.  You have not urinated in 6 to 8 hours, or you have only urinated a small amount of very dark urine.  You have a fever.  You faint. MAKE SURE YOU:   Understand these instructions.  Will watch your condition.  Will get help right away if you are not doing well or get worse. Document Released: 07/27/2005 Document Revised: 10/19/2011 Document Reviewed: 03/16/2011 Exit[employer] Patient Information 2015 [employer]. This information is not intended to replace advice given to you by your health care provider. Make sure you discuss any questions you have with your health care   provider.  

## 2015-03-05 DIAGNOSIS — C50412 Malignant neoplasm of upper-outer quadrant of left female breast: Secondary | ICD-10-CM | POA: Diagnosis not present

## 2015-03-08 ENCOUNTER — Encounter: Payer: Self-pay | Admitting: Hematology and Oncology

## 2015-03-08 NOTE — Progress Notes (Signed)
I faxed [employer] [phone] 1987 notes/form

## 2015-03-11 ENCOUNTER — Ambulatory Visit (HOSPITAL_BASED_OUTPATIENT_CLINIC_OR_DEPARTMENT_OTHER): Payer: Managed Care, Other (non HMO) | Admitting: Hematology and Oncology

## 2015-03-11 ENCOUNTER — Telehealth: Payer: Self-pay | Admitting: Hematology and Oncology

## 2015-03-11 ENCOUNTER — Encounter: Payer: Self-pay | Admitting: *Deleted

## 2015-03-11 ENCOUNTER — Ambulatory Visit (HOSPITAL_BASED_OUTPATIENT_CLINIC_OR_DEPARTMENT_OTHER): Payer: Managed Care, Other (non HMO)

## 2015-03-11 ENCOUNTER — Encounter: Payer: Self-pay | Admitting: Hematology and Oncology

## 2015-03-11 ENCOUNTER — Other Ambulatory Visit (HOSPITAL_BASED_OUTPATIENT_CLINIC_OR_DEPARTMENT_OTHER): Payer: Managed Care, Other (non HMO)

## 2015-03-11 VITALS — BP 160/70 | HR 78 | Temp 99.4°F | Resp 18 | Ht 62.0 in | Wt 217.6 lb

## 2015-03-11 DIAGNOSIS — Z5112 Encounter for antineoplastic immunotherapy: Secondary | ICD-10-CM | POA: Diagnosis not present

## 2015-03-11 DIAGNOSIS — K1231 Oral mucositis (ulcerative) due to antineoplastic therapy: Secondary | ICD-10-CM | POA: Diagnosis not present

## 2015-03-11 DIAGNOSIS — C50412 Malignant neoplasm of upper-outer quadrant of left female breast: Secondary | ICD-10-CM

## 2015-03-11 DIAGNOSIS — D6481 Anemia due to antineoplastic chemotherapy: Secondary | ICD-10-CM | POA: Diagnosis not present

## 2015-03-11 DIAGNOSIS — Z5111 Encounter for antineoplastic chemotherapy: Secondary | ICD-10-CM | POA: Diagnosis not present

## 2015-03-11 DIAGNOSIS — R197 Diarrhea, unspecified: Secondary | ICD-10-CM | POA: Diagnosis not present

## 2015-03-11 LAB — COMPREHENSIVE METABOLIC PANEL (CC13)
ALBUMIN: 3.4 g/dL — AB (ref 3.5–5.0)
ALT: 10 U/L (ref 0–55)
ANION GAP: 12 meq/L — AB (ref 3–11)
AST: 11 U/L (ref 5–34)
Alkaline Phosphatase: 66 U/L (ref 40–150)
BILIRUBIN TOTAL: 0.34 mg/dL (ref 0.20–1.20)
BUN: 14.4 mg/dL (ref 7.0–26.0)
CO2: 22 meq/L (ref 22–29)
Calcium: 8.9 mg/dL (ref 8.4–10.4)
Chloride: 108 mEq/L (ref 98–109)
Creatinine: 0.9 mg/dL (ref 0.6–1.1)
EGFR: 64 mL/min/{1.73_m2} — ABNORMAL LOW (ref >90)
GLUCOSE: 172 mg/dL — AB (ref 70–140)
POTASSIUM: 3.6 meq/L (ref 3.5–5.1)
SODIUM: 141 meq/L (ref 136–145)
Total Protein: 6.5 g/dL (ref 6.4–8.3)

## 2015-03-11 LAB — CBC WITH DIFFERENTIAL/PLATELET
BASO%: 0.2 % (ref 0.0–2.0)
BASOS ABS: 0 10*3/uL (ref 0.0–0.1)
EOS%: 0 % (ref 0.0–7.0)
Eosinophils Absolute: 0 10*3/uL (ref 0.0–0.5)
HEMATOCRIT: 30.5 % — AB (ref 34.8–46.6)
HEMOGLOBIN: 10.5 g/dL — AB (ref 11.6–15.9)
LYMPH#: 1.4 10*3/uL (ref 0.9–3.3)
LYMPH%: 28.6 % (ref 14.0–49.7)
MCH: 34.2 pg — AB (ref 25.1–34.0)
MCHC: 34.5 g/dL (ref 31.5–36.0)
MCV: 99.1 fL (ref 79.5–101.0)
MONO#: 0.2 10*3/uL (ref 0.1–0.9)
MONO%: 3.9 % (ref 0.0–14.0)
NEUT%: 67.3 % (ref 38.4–76.8)
NEUTROS ABS: 3.2 10*3/uL (ref 1.5–6.5)
Platelets: 159 10*3/uL (ref 145–400)
RBC: 3.08 10*6/uL — AB (ref 3.70–5.45)
RDW: 19.5 % — ABNORMAL HIGH (ref 11.2–14.5)
WBC: 4.7 10*3/uL (ref 3.9–10.3)

## 2015-03-11 MED ORDER — ACETAMINOPHEN 325 MG PO TABS
ORAL_TABLET | ORAL | Status: AC
  Filled 2015-03-11: qty 2

## 2015-03-11 MED ORDER — ACETAMINOPHEN 325 MG PO TABS
650.0000 mg | ORAL_TABLET | Freq: Once | ORAL | Status: AC
  Administered 2015-03-11: 650 mg via ORAL

## 2015-03-11 MED ORDER — DOCETAXEL CHEMO INJECTION 160 MG/16ML
60.0000 mg/m2 | Freq: Once | INTRAVENOUS | Status: AC
  Administered 2015-03-11: 130 mg via INTRAVENOUS
  Filled 2015-03-11: qty 13

## 2015-03-11 MED ORDER — SODIUM CHLORIDE 0.9 % IJ SOLN
10.0000 mL | INTRAMUSCULAR | Status: DC | PRN
  Administered 2015-03-11: 10 mL
  Filled 2015-03-11: qty 10

## 2015-03-11 MED ORDER — PALONOSETRON HCL INJECTION 0.25 MG/5ML
INTRAVENOUS | Status: AC
  Filled 2015-03-11: qty 5

## 2015-03-11 MED ORDER — PALONOSETRON HCL INJECTION 0.25 MG/5ML
0.2500 mg | Freq: Once | INTRAVENOUS | Status: AC
  Administered 2015-03-11: 0.25 mg via INTRAVENOUS

## 2015-03-11 MED ORDER — HEPARIN SOD (PORK) LOCK FLUSH 100 UNIT/ML IV SOLN
500.0000 [IU] | Freq: Once | INTRAVENOUS | Status: AC | PRN
  Administered 2015-03-11: 500 [IU]
  Filled 2015-03-11: qty 5

## 2015-03-11 MED ORDER — SODIUM CHLORIDE 0.9 % IV SOLN: Freq: Once | INTRAVENOUS | Status: DC

## 2015-03-11 MED ORDER — SODIUM CHLORIDE 0.9 % IV SOLN
560.0000 mg | Freq: Once | INTRAVENOUS | Status: AC
  Administered 2015-03-11: 560 mg via INTRAVENOUS
  Filled 2015-03-11: qty 56

## 2015-03-11 MED ORDER — PEGFILGRASTIM 6 MG/0.6ML ~~LOC~~ PSKT
6.0000 mg | PREFILLED_SYRINGE | Freq: Once | SUBCUTANEOUS | Status: AC
  Administered 2015-03-11: 6 mg via SUBCUTANEOUS
  Filled 2015-03-11: qty 0.6

## 2015-03-11 MED ORDER — SODIUM CHLORIDE 0.9 % IV SOLN
Freq: Once | INTRAVENOUS | Status: DC
  Filled 2015-03-11: qty 5

## 2015-03-11 MED ORDER — SODIUM CHLORIDE 0.9 % IV SOLN
Freq: Once | INTRAVENOUS | Status: AC
  Administered 2015-03-11: 10:00:00 via INTRAVENOUS

## 2015-03-11 MED ORDER — DIPHENHYDRAMINE HCL 25 MG PO CAPS
ORAL_CAPSULE | ORAL | Status: AC
  Filled 2015-03-11: qty 2

## 2015-03-11 MED ORDER — DIPHENHYDRAMINE HCL 25 MG PO CAPS
50.0000 mg | ORAL_CAPSULE | Freq: Once | ORAL | Status: AC
  Administered 2015-03-11: 50 mg via ORAL

## 2015-03-11 MED ORDER — FOSAPREPITANT DIMEGLUMINE INJECTION 150 MG
Freq: Once | INTRAVENOUS | Status: AC
  Administered 2015-03-11: 11:00:00 via INTRAVENOUS
  Filled 2015-03-11: qty 5

## 2015-03-11 MED ORDER — TRASTUZUMAB CHEMO INJECTION 440 MG
6.0000 mg/kg | Freq: Once | INTRAVENOUS | Status: AC
  Administered 2015-03-11: 630 mg via INTRAVENOUS
  Filled 2015-03-11: qty 30

## 2015-03-11 NOTE — Progress Notes (Signed)
Patient Care Team: [name], MD as PCP - General  DIAGNOSIS: No matching staging information was found for the patient.  SUMMARY OF ONCOLOGIC HISTORY:   Breast cancer of upper-outer quadrant of left female breast   12/07/2014 Initial Diagnosis Left breast 2:00 position: Invasive ductal carcinoma grade 2, ER 5%, PR 0%, Ki-67 80%, HER-2 positive ratio 6.96   12/07/2014 Mammogram Large left breast mass 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, 2 small benign nodules in the right breast stable since 2002, other consistent with normal intramammary lymph node left axilla ultrasound normal-sized lymph nodes   12/31/2014 -  Neo-Adjuvant Chemotherapy TCH Perjeta neoadjuvant chemotherapy 6    CHIEF COMPLIANT: cycle 4 TCH  INTERVAL HISTORY: Savannah Howard is a 69 year old with above-mentioned history of left breast cancer currently on neomycin for chemotherapy with TCH. We had to discontinue Perjeta because of severe diarrhea. Since then she is doing significantly better. Her energy levels are improving. She denies any neuropathy. Denies any nausea or vomiting. Although certain foods may make her slightly nauseated. Her diarrhea is mostly gone. She continues to have 2-3 loose stools but not as much as before. She was started on potassium replacement therapy last time. She is taking 20 mEq daily. Fatigue has improved.  REVIEW OF SYSTEMS:   Constitutional: Denies fevers, chills or abnormal weight loss Eyes: Denies blurriness of vision Ears, nose, mouth, throat, and face: Denies mucositis or sore throat Respiratory: Denies cough, dyspnea or wheezes Cardiovascular: Denies palpitation, chest discomfort or lower extremity swelling Gastrointestinal:  Intermittent 2-3 loose stools Skin: Denies abnormal skin rashes Lymphatics: Denies new lymphadenopathy or easy bruising Neurological:Denies numbness, tingling or new weaknesses Behavioral/Psych: Mood is stable, no new changes   All other systems were reviewed with the  patient and are negative.  I have reviewed the past medical history, past surgical history, social history and family history with the patient and they are unchanged from previous note.  ALLERGIES:  is allergic to penicillins and sulfonamide derivatives.  MEDICATIONS:  Current Outpatient Prescriptions  Medication Sig Dispense Refill  . Alum & Mag Hydroxide-Simeth (MAGIC MOUTHWASH) SOLN Take 5 mLs by mouth 4 (four) times daily. 5 mL 1  . atorvastatin (LIPITOR) 10 MG tablet Take 1 tablet (10 mg total) by mouth daily. 90 tablet 3  . cholecalciferol (VITAMIN D) 1000 UNITS tablet Take 1,000 Units by mouth daily.      . cholestyramine (QUESTRAN) 4 G packet Take 1 packet (4 g total) by mouth 3 (three) times daily with meals. 60 each 2  . cyanocobalamin (,VITAMIN B-12,) 1000 MCG/ML injection Inject 1 mL (1,000 mcg total) into the muscle every 30 (thirty) days. 3 mL 3  . dexamethasone (DECADRON) 4 MG tablet Take 1 tablet (4 mg total) by mouth 2 (two) times daily. Start the day before Taxotere. Then again the day after chemo for 3 days. 30 tablet 1  . diphenoxylate-atropine (LOMOTIL) 2.5-0.025 MG per tablet Take 1 tablet by mouth 4 (four) times daily as needed for diarrhea or loose stools. 60 tablet 1  . folic acid (FOLVITE) 1 MG tablet Take 1 mg by mouth daily.      Marland Kitchen lidocaine-prilocaine (EMLA) cream Apply to affected area once 30 g 3  . LORazepam (ATIVAN) 0.5 MG tablet Take 1 tablet (0.5 mg total) by mouth 3 (three) times daily as needed. for anxiety 270 tablet 1  . losartan-hydrochlorothiazide (HYZAAR) 100-25 MG per tablet Take 1 tablet by mouth daily. 90 tablet 3  . metoprolol  succinate (TOPROL-XL) 50 MG 24 hr tablet Take 1 tablet (50 mg total) by mouth 2 (two) times daily. Take with or immediately following a meal. 180 tablet 2  . nystatin (MYCOSTATIN) 100000 UNIT/ML suspension     . omeprazole (PRILOSEC) 20 MG capsule TAKE ONE CAPSULE BY MOUTH ONCE DAILY 90 capsule 1  . ondansetron (ZOFRAN) 8 MG  tablet Take 1 tablet (8 mg total) by mouth 2 (two) times daily. Start the day after chemo for 3 days. Then take as needed for nausea or vomiting. 30 tablet 1  . oxyCODONE-acetaminophen (ROXICET) 5-325 MG per tablet Take 1-2 tablets by mouth every 4 (four) hours as needed. 50 tablet 0  . potassium chloride (MICRO-K) 10 MEQ CR capsule Take 1 capsule (10 mEq total) by mouth daily. 90 capsule 3  . prochlorperazine (COMPAZINE) 10 MG tablet Take 1 tablet (10 mg total) by mouth every 6 (six) hours as needed (Nausea or vomiting). 30 tablet 1  . terazosin (HYTRIN) 5 MG capsule Take 1 capsule (5 mg total) by mouth at bedtime. 90 capsule 3   No current facility-administered medications for this visit.    PHYSICAL EXAMINATION: ECOG PERFORMANCE STATUS: 2 - Symptomatic, <50% confined to bed  Filed Vitals:   03/11/15 0912  BP: 160/70  Pulse: 78  Temp: 99.4 F (37.4 C)  Resp: 18   Filed Weights   03/11/15 0912  Weight: 217 lb 9.6 oz (98.703 kg)    GENERAL:alert, no distress and comfortable SKIN: skin color, texture, turgor are normal, no rashes or significant lesions EYES: normal, Conjunctiva are pink and non-injected, sclera clear OROPHARYNX:no exudate, no erythema and lips, buccal mucosa, and tongue normal  NECK: supple, thyroid normal size, non-tender, without nodularity LYMPH:  no palpable lymphadenopathy in the cervical, axillary or inguinal LUNGS: clear to auscultation and percussion with normal breathing effort HEART: regular rate & rhythm and no murmurs and no lower extremity edema ABDOMEN:abdomen soft, non-tender and normal bowel sounds Musculoskeletal:no cyanosis of digits and no clubbing  NEURO: alert & oriented x 3 with fluent speech, no focal motor/sensory deficits   LABORATORY DATA:  I have reviewed the data as listed   Chemistry      Component Value Date/Time   NA 144 02/18/2015 0811   NA 138 12/26/2014 0755   K 3.1* 02/18/2015 0811   K 5.7* 12/26/2014 0755   CL 102  12/26/2014 0755   CO2 20* 02/18/2015 0811   CO2 28 11/05/2014 1456   BUN 12.6 02/18/2015 0811   BUN 45* 12/26/2014 0755   CREATININE 1.1 02/18/2015 0811   CREATININE 1.00 12/26/2014 0755      Component Value Date/Time   CALCIUM 8.8 02/18/2015 0811   CALCIUM 9.8 11/05/2014 1456   ALKPHOS 74 02/18/2015 0811   ALKPHOS 68 11/05/2014 1456   AST 9 02/18/2015 0811   AST 15 11/05/2014 1456   ALT 11 02/18/2015 0811   ALT 14 11/05/2014 1456   BILITOT 0.29 02/18/2015 0811   BILITOT 0.4 11/05/2014 1456       Lab Results  Component Value Date   WBC 4.7 03/11/2015   HGB 10.5* 03/11/2015   HCT 30.5* 03/11/2015   MCV 99.1 03/11/2015   PLT 159 03/11/2015   NEUTROABS 3.2 03/11/2015    ASSESSMENT & PLAN:  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 Neoadjuvant chemotherapy with TCH Perjeta 6 cycles followed by Herceptin maintenance 2. After initial chemotherapy repeat MRI and then surgery 3. Adjuvant radiation therapy may be a consideration 4. Followed by antiestrogen therapy  Current Plan: TCH-Perjeta cycle 4 Day 1 (decreased chemotherapy dosage with cycle 2 and discontinued Perjeta for diarrhea) ECHO 12/27/14: EF 60-65%  Toxicities: 1. Diarrhea: Marked improvement after Perjeta was discontinued. 2. Mucositis: magic mouthwash (without lidocaine) to be swished and swallowed QID PRN 3. Nausea: Much improved with decrease in the dosage of chemotherapy 4. Fatigue: Getting worse with each cycle of chemotherapy 5. Elevated BP during/after taxotere. Renewed her prescription for antihypertensive med  6. Skin rash: Very profound. Resolved since stopping Perjeta 7. Tremors 8. Renal failure: IV fluids 1 week after chemotherapy 9. Anemia normocytic related to  chemotherapy being observed  Overall patient is doing significantly better since Perjeta has been discontinued. She reports that her fatigue is actually much better than before. She denies any neuropathy. She is glad that we discontinued Perjeta. She has a low-grade temperature today but denies any signs or symptoms of infection.  RTC in 3 weeks for follow up   No orders of the defined types were placed in this encounter.   The patient has a good understanding of the overall plan. she agrees with it. she will call with any problems that may develop before the next visit here.   Rulon Eisenmenger, MD

## 2015-03-11 NOTE — Telephone Encounter (Signed)
Gave avs & calendar for August/September °

## 2015-03-11 NOTE — Assessment & Plan Note (Addendum)
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 Neoadjuvant chemotherapy with TCH Perjeta 6 cycles followed by Herceptin maintenance 2. After initial chemotherapy repeat MRI and then surgery 3. Adjuvant radiation therapy may be a consideration 4. Followed by antiestrogen therapy  Current Plan: TCH-Perjeta cycle 4 Day 1 (decreased chemotherapy dosage with cycle 2 and discontinued Perjeta for diarrhea) ECHO 12/27/14: EF 60-65%  Toxicities: 1. Diarrhea: Marked improvement after Perjeta was discontinued. 2. Mucositis: magic mouthwash (without lidocaine) to be swished and swallowed QID PRN 3. Nausea: Much improved with decrease in the dosage of chemotherapy 4. Fatigue: Getting worse with each cycle of chemotherapy 5. Elevated BP during/after taxotere. Renewed her prescription for antihypertensive med  6. Skin rash: Very profound. Resolved since stopping Perjeta 7. Tremors 8. Renal failure: IV fluids 1 week after chemotherapy 9. Anemia normocytic related to chemotherapy being observed  RTC in 3 weeks for follow up

## 2015-03-11 NOTE — Patient Instructions (Signed)
[facility] Discharge Instructions for Patients Receiving Chemotherapy  Today you received the following chemotherapy agents Taxotere, Herceptin, Carboplatin  To help prevent nausea and vomiting after your treatment, we encourage you to take your nausea medication    If you develop nausea and vomiting that is not controlled by your nausea medication, call the clinic.   BELOW ARE SYMPTOMS THAT SHOULD BE REPORTED IMMEDIATELY:  *FEVER GREATER THAN 100.5 F  *CHILLS WITH OR WITHOUT FEVER  NAUSEA AND VOMITING THAT IS NOT CONTROLLED WITH YOUR NAUSEA MEDICATION  *UNUSUAL SHORTNESS OF BREATH  *UNUSUAL BRUISING OR BLEEDING  TENDERNESS IN MOUTH AND THROAT WITH OR WITHOUT PRESENCE OF ULCERS  *URINARY PROBLEMS  *BOWEL PROBLEMS  UNUSUAL RASH Items with * indicate a potential emergency and should be followed up as soon as possible.  Feel free to call the clinic you have any questions or concerns. The clinic phone number is [phone].  Please show the CHEMO ALERT CARD at check-in to the Emergency Department and triage nurse.   

## 2015-03-11 NOTE — Addendum Note (Signed)
Addended by: Prentiss Bells on: 03/11/2015 10:23 AM   Modules accepted: Medications

## 2015-03-15 ENCOUNTER — Other Ambulatory Visit: Payer: Self-pay | Admitting: *Deleted

## 2015-03-15 DIAGNOSIS — C50412 Malignant neoplasm of upper-outer quadrant of left female breast: Secondary | ICD-10-CM

## 2015-03-20 ENCOUNTER — Telehealth: Payer: Self-pay | Admitting: Hematology and Oncology

## 2015-03-20 NOTE — Telephone Encounter (Signed)
Called and left a message with echo appointment °

## 2015-03-25 ENCOUNTER — Other Ambulatory Visit (HOSPITAL_COMMUNITY): Payer: Managed Care, Other (non HMO)

## 2015-03-28 ENCOUNTER — Other Ambulatory Visit: Payer: Self-pay | Admitting: *Deleted

## 2015-03-28 MED ORDER — OMEPRAZOLE 20 MG PO CPDR: 20.0000 mg | DELAYED_RELEASE_CAPSULE | Freq: Every day | ORAL | Status: DC

## 2015-04-02 ENCOUNTER — Telehealth: Payer: Self-pay | Admitting: Hematology and Oncology

## 2015-04-02 ENCOUNTER — Ambulatory Visit (HOSPITAL_BASED_OUTPATIENT_CLINIC_OR_DEPARTMENT_OTHER): Payer: Managed Care, Other (non HMO)

## 2015-04-02 ENCOUNTER — Encounter: Payer: Self-pay | Admitting: *Deleted

## 2015-04-02 ENCOUNTER — Ambulatory Visit (HOSPITAL_BASED_OUTPATIENT_CLINIC_OR_DEPARTMENT_OTHER): Payer: Managed Care, Other (non HMO) | Admitting: Hematology and Oncology

## 2015-04-02 ENCOUNTER — Other Ambulatory Visit: Payer: Self-pay | Admitting: Hematology and Oncology

## 2015-04-02 ENCOUNTER — Other Ambulatory Visit (HOSPITAL_BASED_OUTPATIENT_CLINIC_OR_DEPARTMENT_OTHER): Payer: Managed Care, Other (non HMO)

## 2015-04-02 ENCOUNTER — Ambulatory Visit (HOSPITAL_COMMUNITY)
Admission: RE | Admit: 2015-04-02 | Discharge: 2015-04-02 | Disposition: A | Discharge status: Home / Self Care | Payer: Managed Care, Other (non HMO) | Source: Ambulatory Visit | Attending: Hematology and Oncology | Admitting: Hematology and Oncology

## 2015-04-02 ENCOUNTER — Encounter: Payer: Self-pay | Admitting: Hematology and Oncology

## 2015-04-02 VITALS — BP 176/67 | HR 60 | Temp 98.4°F | Resp 20

## 2015-04-02 VITALS — BP 163/68 | HR 75 | Temp 98.4°F | Resp 18 | Ht 62.0 in | Wt 214.0 lb

## 2015-04-02 DIAGNOSIS — R251 Tremor, unspecified: Secondary | ICD-10-CM

## 2015-04-02 DIAGNOSIS — C50412 Malignant neoplasm of upper-outer quadrant of left female breast: Secondary | ICD-10-CM

## 2015-04-02 DIAGNOSIS — Z5111 Encounter for antineoplastic chemotherapy: Secondary | ICD-10-CM | POA: Diagnosis not present

## 2015-04-02 DIAGNOSIS — R530 Neoplastic (malignant) related fatigue: Secondary | ICD-10-CM

## 2015-04-02 DIAGNOSIS — D6481 Anemia due to antineoplastic chemotherapy: Secondary | ICD-10-CM

## 2015-04-02 DIAGNOSIS — R110 Nausea: Secondary | ICD-10-CM

## 2015-04-02 DIAGNOSIS — K1231 Oral mucositis (ulcerative) due to antineoplastic therapy: Secondary | ICD-10-CM | POA: Diagnosis not present

## 2015-04-02 DIAGNOSIS — K521 Toxic gastroenteritis and colitis: Secondary | ICD-10-CM

## 2015-04-02 DIAGNOSIS — E860 Dehydration: Secondary | ICD-10-CM

## 2015-04-02 DIAGNOSIS — T451X5A Adverse effect of antineoplastic and immunosuppressive drugs, initial encounter: Secondary | ICD-10-CM | POA: Diagnosis not present

## 2015-04-02 DIAGNOSIS — I10 Essential (primary) hypertension: Secondary | ICD-10-CM | POA: Insufficient documentation

## 2015-04-02 DIAGNOSIS — D6959 Other secondary thrombocytopenia: Secondary | ICD-10-CM

## 2015-04-02 DIAGNOSIS — Z5112 Encounter for antineoplastic immunotherapy: Secondary | ICD-10-CM | POA: Diagnosis not present

## 2015-04-02 DIAGNOSIS — R030 Elevated blood-pressure reading, without diagnosis of hypertension: Secondary | ICD-10-CM

## 2015-04-02 DIAGNOSIS — R197 Diarrhea, unspecified: Secondary | ICD-10-CM

## 2015-04-02 HISTORY — DX: Adverse effect of antineoplastic and immunosuppressive drugs, initial encounter: D64.81

## 2015-04-02 LAB — CBC WITH DIFFERENTIAL/PLATELET
BASO%: 0 % (ref 0.0–2.0)
Basophils Absolute: 0 10*3/uL (ref 0.0–0.1)
EOS ABS: 0 10*3/uL (ref 0.0–0.5)
EOS%: 0 % (ref 0.0–7.0)
HCT: 23 % — ABNORMAL LOW (ref 34.8–46.6)
HGB: 7.7 g/dL — ABNORMAL LOW (ref 11.6–15.9)
LYMPH%: 47.7 % (ref 14.0–49.7)
MCH: 34.2 pg — ABNORMAL HIGH (ref 25.1–34.0)
MCHC: 33.5 g/dL (ref 31.5–36.0)
MCV: 102.2 fL — ABNORMAL HIGH (ref 79.5–101.0)
MONO#: 0.2 10*3/uL (ref 0.1–0.9)
MONO%: 4.5 % (ref 0.0–14.0)
NEUT%: 47.8 % (ref 38.4–76.8)
NEUTROS ABS: 1.9 10*3/uL (ref 1.5–6.5)
NRBC: 0 % (ref 0–0)
PLATELETS: 92 10*3/uL — AB (ref 145–400)
RBC: 2.25 10*6/uL — AB (ref 3.70–5.45)
RDW: 17.7 % — AB (ref 11.2–14.5)
WBC: 4 10*3/uL (ref 3.9–10.3)
lymph#: 1.9 10*3/uL (ref 0.9–3.3)

## 2015-04-02 LAB — COMPREHENSIVE METABOLIC PANEL (CC13)
ALT: 12 U/L (ref 0–55)
AST: 11 U/L (ref 5–34)
Albumin: 2.8 g/dL — ABNORMAL LOW (ref 3.5–5.0)
Alkaline Phosphatase: 67 U/L (ref 40–150)
Anion Gap: 13 mEq/L — ABNORMAL HIGH (ref 3–11)
BUN: 13.5 mg/dL (ref 7.0–26.0)
CHLORIDE: 114 meq/L — AB (ref 98–109)
CO2: 19 meq/L — AB (ref 22–29)
CREATININE: 1 mg/dL (ref 0.6–1.1)
Calcium: 8.3 mg/dL — ABNORMAL LOW (ref 8.4–10.4)
EGFR: 57 mL/min/{1.73_m2} — ABNORMAL LOW (ref >90)
GLUCOSE: 177 mg/dL — AB (ref 70–140)
Potassium: 3.4 mEq/L — ABNORMAL LOW (ref 3.5–5.1)
SODIUM: 146 meq/L — AB (ref 136–145)
TOTAL PROTEIN: 6.2 g/dL — AB (ref 6.4–8.3)

## 2015-04-02 LAB — PREPARE RBC (CROSSMATCH)

## 2015-04-02 LAB — ABO/RH: ABO/RH(D): O POS

## 2015-04-02 MED ORDER — SODIUM CHLORIDE 0.9 % IJ SOLN
10.0000 mL | INTRAMUSCULAR | Status: DC | PRN
  Administered 2015-04-02: 10 mL
  Filled 2015-04-02: qty 10

## 2015-04-02 MED ORDER — PALONOSETRON HCL INJECTION 0.25 MG/5ML
INTRAVENOUS | Status: AC
Start: 2015-04-02 — End: 2015-04-02
  Filled 2015-04-02: qty 5

## 2015-04-02 MED ORDER — PEGFILGRASTIM 6 MG/0.6ML ~~LOC~~ PSKT
6.0000 mg | PREFILLED_SYRINGE | Freq: Once | SUBCUTANEOUS | Status: AC
  Administered 2015-04-02: 6 mg via SUBCUTANEOUS
  Filled 2015-04-02: qty 0.6

## 2015-04-02 MED ORDER — SODIUM CHLORIDE 0.9 % IV SOLN
445.6000 mg | Freq: Once | INTRAVENOUS | Status: AC
  Administered 2015-04-02: 450 mg via INTRAVENOUS
  Filled 2015-04-02: qty 45

## 2015-04-02 MED ORDER — SODIUM CHLORIDE 0.9 % IV SOLN
6.0000 mg/kg | Freq: Once | INTRAVENOUS | Status: AC
  Administered 2015-04-02: 630 mg via INTRAVENOUS
  Filled 2015-04-02: qty 30

## 2015-04-02 MED ORDER — ACETAMINOPHEN 325 MG PO TABS
650.0000 mg | ORAL_TABLET | Freq: Once | ORAL | Status: AC
  Administered 2015-04-02: 650 mg via ORAL

## 2015-04-02 MED ORDER — PALONOSETRON HCL INJECTION 0.25 MG/5ML
0.2500 mg | Freq: Once | INTRAVENOUS | Status: AC
  Administered 2015-04-02: 0.25 mg via INTRAVENOUS

## 2015-04-02 MED ORDER — SODIUM CHLORIDE 0.9 % IV SOLN
Freq: Once | INTRAVENOUS | Status: AC
  Administered 2015-04-02: 10:00:00 via INTRAVENOUS

## 2015-04-02 MED ORDER — ACETAMINOPHEN 325 MG PO TABS
ORAL_TABLET | ORAL | Status: AC
  Filled 2015-04-02: qty 2

## 2015-04-02 MED ORDER — DIPHENHYDRAMINE HCL 25 MG PO CAPS
50.0000 mg | ORAL_CAPSULE | Freq: Once | ORAL | Status: AC
  Administered 2015-04-02: 50 mg via ORAL

## 2015-04-02 MED ORDER — DIPHENHYDRAMINE HCL 25 MG PO CAPS
ORAL_CAPSULE | ORAL | Status: AC
  Filled 2015-04-02: qty 1

## 2015-04-02 MED ORDER — DIPHENHYDRAMINE HCL 25 MG PO CAPS
25.0000 mg | ORAL_CAPSULE | Freq: Once | ORAL | Status: AC
  Administered 2015-04-02: 25 mg via ORAL

## 2015-04-02 MED ORDER — DIPHENHYDRAMINE HCL 25 MG PO CAPS
ORAL_CAPSULE | ORAL | Status: AC
  Filled 2015-04-02: qty 2

## 2015-04-02 MED ORDER — DEXTROSE 5 % IV SOLN
50.0000 mg/m2 | Freq: Once | INTRAVENOUS | Status: AC
  Administered 2015-04-02: 110 mg via INTRAVENOUS
  Filled 2015-04-02: qty 11

## 2015-04-02 MED ORDER — SODIUM CHLORIDE 0.9 % IV SOLN
Freq: Once | INTRAVENOUS | Status: AC
  Administered 2015-04-02: 12:00:00 via INTRAVENOUS
  Filled 2015-04-02: qty 5

## 2015-04-02 MED ORDER — HEPARIN SOD (PORK) LOCK FLUSH 100 UNIT/ML IV SOLN
500.0000 [IU] | Freq: Once | INTRAVENOUS | Status: AC | PRN
  Administered 2015-04-02: 500 [IU]
  Filled 2015-04-02: qty 5

## 2015-04-02 NOTE — Addendum Note (Signed)
Addended by: Prentiss Bells on: 04/02/2015 09:07 AM   Modules accepted: Orders, SmartSet

## 2015-04-02 NOTE — Telephone Encounter (Signed)
Appointments made and echo rescheduled for patient.  Patient has information to call and schedule her mri   anne

## 2015-04-02 NOTE — Patient Instructions (Signed)
[facility] Discharge Instructions for Patients Receiving Chemotherapy  Today you received the following chemotherapy agents:   Herceptin, taxotere, carboplatin  To help prevent nausea and vomiting after your treatment, we encourage you to take your nausea medication.Blood Transfusion Information WHAT IS A BLOOD TRANSFUSION? A transfusion is the replacement of blood or some of its parts. Blood is made up of multiple cells which provide different functions.  Red blood cells carry oxygen and are used for blood loss replacement.  White blood cells fight against infection.  Platelets control bleeding.  Plasma helps clot blood.  Other blood products are available for specialized needs, such as hemophilia or other clotting disorders. BEFORE THE TRANSFUSION  Who gives blood for transfusions?   You may be able to donate blood to be used at a later date on yourself (autologous donation).  Relatives can be asked to donate blood. This is generally not any safer than if you have received blood from a stranger. The same precautions are taken to ensure safety when a relative's blood is donated.  Healthy volunteers who are fully evaluated to make sure their blood is safe. This is blood bank blood. Transfusion therapy is the safest it has ever been in the practice of medicine. Before blood is taken from a donor, a complete history is taken to make sure that person has no history of diseases nor engages in risky social behavior (examples are intravenous drug use or sexual activity with multiple partners). The donor's travel history is screened to minimize risk of transmitting infections, such as malaria. The donated blood is tested for signs of infectious diseases, such as HIV and hepatitis. The blood is then tested to be sure it is compatible with you in order to minimize the chance of a transfusion reaction. If you or a relative donates blood, this is often done in anticipation of surgery  and is not appropriate for emergency situations. It takes many days to process the donated blood. RISKS AND COMPLICATIONS Although transfusion therapy is very safe and saves many lives, the main dangers of transfusion include:   Getting an infectious disease.  Developing a transfusion reaction. This is an allergic reaction to something in the blood you were given. Every precaution is taken to prevent this. The decision to have a blood transfusion has been considered carefully by your caregiver before blood is given. Blood is not given unless the benefits outweigh the risks. AFTER THE TRANSFUSION  Right after receiving a blood transfusion, you will usually feel much better and more energetic. This is especially true if your red blood cells have gotten low (anemic). The transfusion raises the level of the red blood cells which carry oxygen, and this usually causes an energy increase.  The nurse administering the transfusion will monitor you carefully for complications. HOME CARE INSTRUCTIONS  No special instructions are needed after a transfusion. You may find your energy is better. Speak with your caregiver about any limitations on activity for underlying diseases you may have. SEEK MEDICAL CARE IF:   Your condition is not improving after your transfusion.  You develop redness or irritation at the intravenous (IV) site. SEEK IMMEDIATE MEDICAL CARE IF:  Any of the following symptoms occur over the next 12 hours:  Shaking chills.  You have a temperature by mouth above 102 F (38.9 C), not controlled by medicine.  Chest, back, or muscle pain.  People around you feel you are not acting correctly or are confused.  Shortness of breath or  difficulty breathing.  Dizziness and fainting.  You get a rash or develop hives.  You have a decrease in urine output.  Your urine turns a dark color or changes to pink, red, or brown. Any of the following symptoms occur over the next 10  days:  You have a temperature by mouth above 102 F (38.9 C), not controlled by medicine.  Shortness of breath.  Weakness after normal activity.  The white part of the eye turns yellow (jaundice).  You have a decrease in the amount of urine or are urinating less often.  Your urine turns a dark color or changes to pink, red, or brown. Document Released: 07/24/2000 Document Revised: 10/19/2011 Document Reviewed: 03/12/2008 [facility] Patient Information 2015 [place], [place]. This information is not intended to replace advice given to you by your health care provider. Make sure you discuss any questions you have with your health care provider.  If you develop nausea and vomiting that is not controlled by your nausea medication, call the clinic.   BELOW ARE SYMPTOMS THAT SHOULD BE REPORTED IMMEDIATELY:  *FEVER GREATER THAN 100.5 F  *CHILLS WITH OR WITHOUT FEVER  NAUSEA AND VOMITING THAT IS NOT CONTROLLED WITH YOUR NAUSEA MEDICATION  *UNUSUAL SHORTNESS OF BREATH  *UNUSUAL BRUISING OR BLEEDING  TENDERNESS IN MOUTH AND THROAT WITH OR WITHOUT PRESENCE OF ULCERS  *URINARY PROBLEMS  *BOWEL PROBLEMS  UNUSUAL RASH Items with * indicate a potential emergency and should be followed up as soon as possible.  Feel free to call the clinic you have any questions or concerns. The clinic phone number is [phone].  Please show the [facility] at check-in to the Emergency Department and triage nurse.

## 2015-04-02 NOTE — Progress Notes (Signed)
OK to treat with platelets 94 per Dr. Lindi Adie.  Chemo dose will be reduced.  Pt will receive one unit PRBC.  Infusion charge nurse notified.

## 2015-04-02 NOTE — Assessment & Plan Note (Signed)
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 Neoadjuvant chemotherapy with TCH Perjeta 6 cycles followed by Herceptin maintenance 2. After initial chemotherapy repeat MRI and then surgery 3. Adjuvant radiation therapy may be a consideration 4. Followed by antiestrogen therapy  Current Plan: TCH-Perjeta cycle 5 Day 1 (decreased chemotherapy dosage with cycle 2 and discontinued Perjeta for diarrhea) ECHO 12/27/14: EF 60-65%  Toxicities: 1. Diarrhea: Marked improvement after Perjeta was discontinued. 2. Mucositis: magic mouthwash (without lidocaine) to be swished and swallowed QID PRN 3. Nausea: Much improved with decrease in the dosage of chemotherapy 4. Fatigue: Getting worse with each cycle of chemotherapy 5. Elevated BP during/after taxotere. Renewed her prescription for antihypertensive med  6. Skin rash: Very profound. Resolved since stopping Perjeta 7. Tremors 8. Renal failure: IV fluids 1 week after chemotherapy 9. Anemia normocytic related to chemotherapy being observed  Overall patient is doing significantly better since Perjeta has been discontinued. RTC in 3 weeks for cycle 6 and follow up.

## 2015-04-02 NOTE — Progress Notes (Signed)
OK to treat with platelet count of 94 per Dr. Lindi Adie.

## 2015-04-02 NOTE — Progress Notes (Signed)
Patient Care Team: [name], MD as PCP - General  DIAGNOSIS: No matching staging information was found for the patient.  SUMMARY OF ONCOLOGIC HISTORY:   Breast cancer of upper-outer quadrant of left female breast   12/07/2014 Initial Diagnosis Left breast 2:00 position: Invasive ductal carcinoma grade 2, ER 5%, PR 0%, Ki-67 80%, HER-2 positive ratio 6.96   12/07/2014 Mammogram Large left breast mass 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, 2 small benign nodules in the right breast stable since 2002, other consistent with normal intramammary lymph node left axilla ultrasound normal-sized lymph nodes   12/31/2014 -  Neo-Adjuvant Chemotherapy TCH Perjeta neoadjuvant chemotherapy 6    CHIEF COMPLIANT: cycle 5 TCH  INTERVAL HISTORY: Savannah Howard is this 69 year old lady with above-mentioned history of breast cancer currently on new adjuvant chemotherapy. We had to discontinue Perjeta because of diarrhea. Since then her diarrhea has improved significantly except last week she had one episode of profound diarrhea accompanied by a fever of 101. She took Tylenol and her fever improved. She also had mild nausea and vomiting which resolved with Zofran. She does not have any neuropathy. She feels mildly fatigued. She requests a wheelchair for an ablation.  REVIEW OF SYSTEMS:   Constitutional: Denies fevers, chills or abnormal weight loss Eyes: Denies blurriness of vision Ears, nose, mouth, throat, and face: Denies mucositis or sore throat Respiratory: Denies cough, dyspnea or wheezes Cardiovascular: Denies palpitation, chest discomfort or lower extremity swelling Gastrointestinal:  Denies nausea, heartburn or change in bowel habits Skin: Denies abnormal skin rashes Lymphatics: Denies new lymphadenopathy or easy bruising Neurological:generalized weakness Behavioral/Psych: Mood is stable, no new changes   All other systems were reviewed with the patient and are negative.  I have reviewed the past medical  history, past surgical history, social history and family history with the patient and they are unchanged from previous note.  ALLERGIES:  is allergic to penicillins and sulfonamide derivatives.  MEDICATIONS:  Current Outpatient Prescriptions  Medication Sig Dispense Refill  . Alum & Mag Hydroxide-Simeth (MAGIC MOUTHWASH) SOLN Take 5 mLs by mouth 4 (four) times daily. 5 mL 1  . atorvastatin (LIPITOR) 10 MG tablet Take 1 tablet (10 mg total) by mouth daily. 90 tablet 3  . cholecalciferol (VITAMIN D) 1000 UNITS tablet Take 1,000 Units by mouth daily.      . cholestyramine (QUESTRAN) 4 G packet Take 1 packet (4 g total) by mouth 3 (three) times daily with meals. 60 each 2  . cyanocobalamin (,VITAMIN B-12,) 1000 MCG/ML injection Inject 1 mL (1,000 mcg total) into the muscle every 30 (thirty) days. 3 mL 3  . dexamethasone (DECADRON) 4 MG tablet Take 1 tablet (4 mg total) by mouth 2 (two) times daily. Start the day before Taxotere. Then again the day after chemo for 3 days. 30 tablet 1  . diphenoxylate-atropine (LOMOTIL) 2.5-0.025 MG per tablet Take 1 tablet by mouth 4 (four) times daily as needed for diarrhea or loose stools. 60 tablet 1  . folic acid (FOLVITE) 1 MG tablet Take 1 mg by mouth daily.      Marland Kitchen lidocaine-prilocaine (EMLA) cream Apply to affected area once 30 g 3  . LORazepam (ATIVAN) 0.5 MG tablet Take 1 tablet (0.5 mg total) by mouth 3 (three) times daily as needed. for anxiety 270 tablet 1  . losartan-hydrochlorothiazide (HYZAAR) 100-25 MG per tablet Take 1 tablet by mouth daily. 90 tablet 3  . metoprolol succinate (TOPROL-XL) 50 MG 24 hr tablet Take 1 tablet (  50 mg total) by mouth 2 (two) times daily. Take with or immediately following a meal. 180 tablet 2  . nystatin (MYCOSTATIN) 100000 UNIT/ML suspension     . omeprazole (PRILOSEC) 20 MG capsule Take 1 capsule (20 mg total) by mouth daily. 90 capsule 1  . ondansetron (ZOFRAN) 8 MG tablet Take 1 tablet (8 mg total) by mouth 2 (two)  times daily. Start the day after chemo for 3 days. Then take as needed for nausea or vomiting. 30 tablet 1  . oxyCODONE-acetaminophen (ROXICET) 5-325 MG per tablet Take 1-2 tablets by mouth every 4 (four) hours as needed. 50 tablet 0  . potassium chloride (MICRO-K) 10 MEQ CR capsule Take 1 capsule (10 mEq total) by mouth daily. 90 capsule 3  . prochlorperazine (COMPAZINE) 10 MG tablet Take 1 tablet (10 mg total) by mouth every 6 (six) hours as needed (Nausea or vomiting). 30 tablet 1  . terazosin (HYTRIN) 5 MG capsule Take 1 capsule (5 mg total) by mouth at bedtime. 90 capsule 3   No current facility-administered medications for this visit.    PHYSICAL EXAMINATION: ECOG PERFORMANCE STATUS: 1 - Symptomatic but completely ambulatory  Filed Vitals:   04/02/15 0825  BP: 163/68  Pulse: 75  Temp: 98.4 F (36.9 C)  Resp: 18   Filed Weights   04/02/15 0825  Weight: 214 lb (97.07 kg)    GENERAL:alert, no distress and comfortable SKIN: skin color, texture, turgor are normal, no rashes or significant lesions EYES: normal, Conjunctiva are pink and non-injected, sclera clear OROPHARYNX:no exudate, no erythema and lips, buccal mucosa, and tongue normal  NECK: supple, thyroid normal size, non-tender, without nodularity LYMPH:  no palpable lymphadenopathy in the cervical, axillary or inguinal LUNGS: clear to auscultation and percussion with normal breathing effort HEART: regular rate & rhythm and no murmurs and no lower extremity edema ABDOMEN:abdomen soft, non-tender and normal bowel sounds Musculoskeletal:no cyanosis of digits and no clubbing  NEURO: alert & oriented x 3 with fluent speech, no focal motor/sensory deficits, denies neuropathy  LABORATORY DATA:  I have reviewed the data as listed   Chemistry      Component Value Date/Time   NA 141 03/11/2015 0855   NA 138 12/26/2014 0755   K 3.6 03/11/2015 0855   K 5.7* 12/26/2014 0755   CL 102 12/26/2014 0755   CO2 22 03/11/2015 0855    CO2 28 11/05/2014 1456   BUN 14.4 03/11/2015 0855   BUN 45* 12/26/2014 0755   CREATININE 0.9 03/11/2015 0855   CREATININE 1.00 12/26/2014 0755      Component Value Date/Time   CALCIUM 8.9 03/11/2015 0855   CALCIUM 9.8 11/05/2014 1456   ALKPHOS 66 03/11/2015 0855   ALKPHOS 68 11/05/2014 1456   AST 11 03/11/2015 0855   AST 15 11/05/2014 1456   ALT 10 03/11/2015 0855   ALT 14 11/05/2014 1456   BILITOT 0.34 03/11/2015 0855   BILITOT 0.4 11/05/2014 1456       Lab Results  Component Value Date   WBC 4.0 04/02/2015   HGB 7.7* 04/02/2015   HCT 23.0* 04/02/2015   MCV 102.2* 04/02/2015   PLT 92* 04/02/2015   NEUTROABS 1.9 04/02/2015   ASSESSMENT & PLAN:  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 Neoadjuvant chemotherapy with TCH Perjeta 6 cycles followed by Herceptin maintenance 2. After initial chemotherapy repeat MRI and then surgery 3. Adjuvant radiation therapy may be a consideration 4. Followed by antiestrogen therapy  Current Plan: TCH-Perjeta cycle 5 Day 1 (decreased chemotherapy dosage with cycle 2 and discontinued Perjeta for diarrhea) ECHO 12/27/14: EF 60-65%  Toxicities: 1. Diarrhea: Marked improvement after Perjeta was discontinued. 2. Mucositis: magic mouthwash (without lidocaine) to be swished and swallowed QID PRN 3. Nausea: Much improved with decrease in the dosage of chemotherapy 4. Fatigue: Getting worse with each cycle of chemotherapy 5. Elevated BP during/after taxotere. Renewed her prescription for antihypertensive med  6. Skin rash: Very profound. Resolved since stopping Perjeta 7. Tremors 8. Renal failure: IV fluids 1 week after chemotherapy 9. Anemia normocytic related to chemotherapy complicated by bleeding from anal  fissure. Will transfuse 1 unit of packed red cells 04/02/2015 10. Thrombocytopenia platelets 94 on 04/02/2015: Okay to treat with lower dosage of chemotherapy today  Overall patient is doing significantly better since Perjeta has been discontinued. RTC in 3 weeks for cycle 6 and follow up.     No orders of the defined types were placed in this encounter.   The patient has a good understanding of the overall plan. she agrees with it. she will call with any problems that may develop before the next visit here.   Rulon Eisenmenger, MD     Resistant presented today he is he is

## 2015-04-03 ENCOUNTER — Ambulatory Visit (HOSPITAL_COMMUNITY)
Admission: RE | Admit: 2015-04-03 | Discharge: 2015-04-03 | Disposition: A | Discharge status: Home / Self Care | Payer: Managed Care, Other (non HMO) | Source: Ambulatory Visit | Attending: Hematology and Oncology | Admitting: Hematology and Oncology

## 2015-04-03 DIAGNOSIS — C50412 Malignant neoplasm of upper-outer quadrant of left female breast: Secondary | ICD-10-CM | POA: Diagnosis not present

## 2015-04-03 DIAGNOSIS — Z01818 Encounter for other preprocedural examination: Secondary | ICD-10-CM | POA: Diagnosis not present

## 2015-04-03 DIAGNOSIS — I10 Essential (primary) hypertension: Secondary | ICD-10-CM | POA: Diagnosis not present

## 2015-04-03 DIAGNOSIS — E785 Hyperlipidemia, unspecified: Secondary | ICD-10-CM | POA: Diagnosis not present

## 2015-04-03 LAB — TYPE AND SCREEN
ABO/RH(D): O POS
Antibody Screen: NEGATIVE
UNIT DIVISION: 0

## 2015-04-03 NOTE — Addendum Note (Signed)
Addended by: Adalberto Cole on: 04/03/2015 09:38 AM   Modules accepted: Orders

## 2015-04-03 NOTE — Progress Notes (Signed)
  Echocardiogram 2D Echocardiogram has been performed.  Jennette Dubin 04/03/2015, 11:04 AM

## 2015-04-09 ENCOUNTER — Other Ambulatory Visit: Payer: Self-pay | Admitting: General Surgery

## 2015-04-09 DIAGNOSIS — C50412 Malignant neoplasm of upper-outer quadrant of left female breast: Secondary | ICD-10-CM

## 2015-04-16 ENCOUNTER — Telehealth: Payer: Self-pay | Admitting: *Deleted

## 2015-04-16 NOTE — Telephone Encounter (Signed)
Attempted to contact pt to discuss scheduling MRI. Unable to leave vm Left msg for her family friend that has privileges to her health care information.

## 2015-04-17 ENCOUNTER — Other Ambulatory Visit: Payer: Self-pay | Admitting: Hematology and Oncology

## 2015-04-22 ENCOUNTER — Other Ambulatory Visit (HOSPITAL_BASED_OUTPATIENT_CLINIC_OR_DEPARTMENT_OTHER): Payer: Managed Care, Other (non HMO)

## 2015-04-22 ENCOUNTER — Other Ambulatory Visit: Payer: Managed Care, Other (non HMO)

## 2015-04-22 ENCOUNTER — Telehealth: Payer: Self-pay | Admitting: Hematology and Oncology

## 2015-04-22 ENCOUNTER — Encounter: Payer: Self-pay | Admitting: Hematology and Oncology

## 2015-04-22 ENCOUNTER — Ambulatory Visit (HOSPITAL_BASED_OUTPATIENT_CLINIC_OR_DEPARTMENT_OTHER): Payer: Managed Care, Other (non HMO) | Admitting: Hematology and Oncology

## 2015-04-22 ENCOUNTER — Ambulatory Visit (HOSPITAL_BASED_OUTPATIENT_CLINIC_OR_DEPARTMENT_OTHER): Payer: Managed Care, Other (non HMO)

## 2015-04-22 VITALS — BP 170/72 | HR 106 | Temp 98.5°F | Resp 19 | Ht 62.0 in | Wt 214.1 lb

## 2015-04-22 DIAGNOSIS — C50412 Malignant neoplasm of upper-outer quadrant of left female breast: Secondary | ICD-10-CM

## 2015-04-22 DIAGNOSIS — Z5111 Encounter for antineoplastic chemotherapy: Secondary | ICD-10-CM | POA: Diagnosis not present

## 2015-04-22 DIAGNOSIS — R110 Nausea: Secondary | ICD-10-CM

## 2015-04-22 DIAGNOSIS — I10 Essential (primary) hypertension: Secondary | ICD-10-CM

## 2015-04-22 DIAGNOSIS — D6959 Other secondary thrombocytopenia: Secondary | ICD-10-CM

## 2015-04-22 DIAGNOSIS — K521 Toxic gastroenteritis and colitis: Secondary | ICD-10-CM

## 2015-04-22 DIAGNOSIS — Z5112 Encounter for antineoplastic immunotherapy: Secondary | ICD-10-CM | POA: Diagnosis not present

## 2015-04-22 DIAGNOSIS — R030 Elevated blood-pressure reading, without diagnosis of hypertension: Secondary | ICD-10-CM | POA: Diagnosis not present

## 2015-04-22 DIAGNOSIS — F411 Generalized anxiety disorder: Secondary | ICD-10-CM

## 2015-04-22 DIAGNOSIS — D6481 Anemia due to antineoplastic chemotherapy: Secondary | ICD-10-CM

## 2015-04-22 DIAGNOSIS — E785 Hyperlipidemia, unspecified: Secondary | ICD-10-CM

## 2015-04-22 DIAGNOSIS — T451X5A Adverse effect of antineoplastic and immunosuppressive drugs, initial encounter: Secondary | ICD-10-CM

## 2015-04-22 DIAGNOSIS — R251 Tremor, unspecified: Secondary | ICD-10-CM

## 2015-04-22 DIAGNOSIS — C801 Malignant (primary) neoplasm, unspecified: Secondary | ICD-10-CM

## 2015-04-22 LAB — CBC WITH DIFFERENTIAL/PLATELET
BASO%: 0.5 % (ref 0.0–2.0)
BASOS ABS: 0 10*3/uL (ref 0.0–0.1)
EOS ABS: 0 10*3/uL (ref 0.0–0.5)
EOS%: 0 % (ref 0.0–7.0)
HCT: 31 % — ABNORMAL LOW (ref 34.8–46.6)
HGB: 10.7 g/dL — ABNORMAL LOW (ref 11.6–15.9)
LYMPH%: 28.3 % (ref 14.0–49.7)
MCH: 35.3 pg — AB (ref 25.1–34.0)
MCHC: 34.4 g/dL (ref 31.5–36.0)
MCV: 102.5 fL — AB (ref 79.5–101.0)
MONO#: 0.2 10*3/uL (ref 0.1–0.9)
MONO%: 4.8 % (ref 0.0–14.0)
NEUT#: 3.3 10*3/uL (ref 1.5–6.5)
NEUT%: 66.4 % (ref 38.4–76.8)
PLATELETS: 140 10*3/uL — AB (ref 145–400)
RBC: 3.02 10*6/uL — AB (ref 3.70–5.45)
RDW: 19.5 % — ABNORMAL HIGH (ref 11.2–14.5)
WBC: 5 10*3/uL (ref 3.9–10.3)
lymph#: 1.4 10*3/uL (ref 0.9–3.3)

## 2015-04-22 LAB — COMPREHENSIVE METABOLIC PANEL (CC13)
ALBUMIN: 3.5 g/dL (ref 3.5–5.0)
ALK PHOS: 78 U/L (ref 40–150)
ALT: 13 U/L (ref 0–55)
ANION GAP: 18 meq/L — AB (ref 3–11)
AST: 12 U/L (ref 5–34)
BILIRUBIN TOTAL: 0.52 mg/dL (ref 0.20–1.20)
BUN: 10.6 mg/dL (ref 7.0–26.0)
CO2: 19 meq/L — AB (ref 22–29)
Calcium: 9.5 mg/dL (ref 8.4–10.4)
Chloride: 106 mEq/L (ref 98–109)
Creatinine: 1 mg/dL (ref 0.6–1.1)
EGFR: 56 mL/min/{1.73_m2} — AB (ref >90)
GLUCOSE: 186 mg/dL — AB (ref 70–140)
POTASSIUM: 4.1 meq/L (ref 3.5–5.1)
SODIUM: 143 meq/L (ref 136–145)
Total Protein: 7.1 g/dL (ref 6.4–8.3)

## 2015-04-22 MED ORDER — ACETAMINOPHEN 325 MG PO TABS
ORAL_TABLET | ORAL | Status: AC
  Filled 2015-04-22: qty 2

## 2015-04-22 MED ORDER — FOSAPREPITANT DIMEGLUMINE INJECTION 150 MG
Freq: Once | INTRAVENOUS | Status: AC
  Administered 2015-04-22: 10:00:00 via INTRAVENOUS
  Filled 2015-04-22: qty 5

## 2015-04-22 MED ORDER — PALONOSETRON HCL INJECTION 0.25 MG/5ML
0.2500 mg | Freq: Once | INTRAVENOUS | Status: AC
  Administered 2015-04-22: 0.25 mg via INTRAVENOUS

## 2015-04-22 MED ORDER — TRASTUZUMAB CHEMO INJECTION 440 MG
6.0000 mg/kg | Freq: Once | INTRAVENOUS | Status: AC
  Administered 2015-04-22: 630 mg via INTRAVENOUS
  Filled 2015-04-22: qty 30

## 2015-04-22 MED ORDER — ACETAMINOPHEN 325 MG PO TABS
650.0000 mg | ORAL_TABLET | Freq: Once | ORAL | Status: AC
  Administered 2015-04-22: 650 mg via ORAL

## 2015-04-22 MED ORDER — DIPHENHYDRAMINE HCL 25 MG PO CAPS
ORAL_CAPSULE | ORAL | Status: AC
  Filled 2015-04-22: qty 2

## 2015-04-22 MED ORDER — DIPHENHYDRAMINE HCL 25 MG PO CAPS
50.0000 mg | ORAL_CAPSULE | Freq: Once | ORAL | Status: AC
  Administered 2015-04-22: 50 mg via ORAL

## 2015-04-22 MED ORDER — HEPARIN SOD (PORK) LOCK FLUSH 100 UNIT/ML IV SOLN
500.0000 [IU] | Freq: Once | INTRAVENOUS | Status: AC | PRN
  Administered 2015-04-22: 500 [IU]
  Filled 2015-04-22: qty 5

## 2015-04-22 MED ORDER — DEXTROSE 5 % IV SOLN
50.0000 mg/m2 | Freq: Once | INTRAVENOUS | Status: AC
  Administered 2015-04-22: 110 mg via INTRAVENOUS
  Filled 2015-04-22: qty 11

## 2015-04-22 MED ORDER — PEGFILGRASTIM 6 MG/0.6ML ~~LOC~~ PSKT
6.0000 mg | PREFILLED_SYRINGE | Freq: Once | SUBCUTANEOUS | Status: AC
  Administered 2015-04-22: 6 mg via SUBCUTANEOUS
  Filled 2015-04-22: qty 0.6

## 2015-04-22 MED ORDER — SODIUM CHLORIDE 0.9 % IV SOLN
Freq: Once | INTRAVENOUS | Status: AC
  Administered 2015-04-22: 10:00:00 via INTRAVENOUS

## 2015-04-22 MED ORDER — SODIUM CHLORIDE 0.9 % IV SOLN
445.6000 mg | Freq: Once | INTRAVENOUS | Status: AC
  Administered 2015-04-22: 450 mg via INTRAVENOUS
  Filled 2015-04-22: qty 45

## 2015-04-22 MED ORDER — SODIUM CHLORIDE 0.9 % IJ SOLN
10.0000 mL | INTRAMUSCULAR | Status: DC | PRN
  Administered 2015-04-22: 10 mL
  Filled 2015-04-22: qty 10

## 2015-04-22 MED ORDER — PALONOSETRON HCL INJECTION 0.25 MG/5ML
INTRAVENOUS | Status: AC
  Filled 2015-04-22: qty 5

## 2015-04-22 NOTE — Telephone Encounter (Signed)
Savannah Howard has placed her on a recall for November for echo and md appointment

## 2015-04-22 NOTE — Progress Notes (Signed)
Patient Care Team: [name], MD as PCP - General  DIAGNOSIS: No matching staging information was found for the patient.  SUMMARY OF ONCOLOGIC HISTORY:   Breast cancer of upper-outer quadrant of left female breast   12/07/2014 Initial Diagnosis Left breast 2:00 position: Invasive ductal carcinoma grade 2, ER 5%, PR 0%, Ki-67 80%, HER-2 positive ratio 6.96   12/07/2014 Mammogram Large left breast mass 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, 2 small benign nodules in the right breast stable since 2002, other consistent with normal intramammary lymph node left axilla ultrasound normal-sized lymph nodes   12/31/2014 -  Neo-Adjuvant Chemotherapy TCH Perjeta neoadjuvant chemotherapy 6    CHIEF COMPLIANT:  Cycle 6 neoadjuvant TCH  INTERVAL HISTORY: Savannah Howard is a  69 year old with above-mentioned history of left breast cancer currently on neo-adjuvant chemotherapy. Today is cycle 6 and his last cycle of chemotherapy. After this she will start  Maintenance Herceptin. She feels remarkably well since the last chemotherapy. She received 1 unit of packed red cell blood transfusion. This is significantly improved her symptoms. She denies any nausea vomiting. She is excited that this is her last of her chemotherapy treatments.  REVIEW OF SYSTEMS:   Constitutional: Denies fevers, chills or abnormal weight loss Eyes: Denies blurriness of vision Ears, nose, mouth, throat, and face: Denies mucositis or sore throat Respiratory: Denies cough, dyspnea or wheezes Cardiovascular: Denies palpitation, chest discomfort or lower extremity swelling Gastrointestinal:  Denies nausea, heartburn or change in bowel habits Skin: Denies abnormal skin rashes Lymphatics: Denies new lymphadenopathy or easy bruising Neurological:Denies numbness, tingling or new weaknesses Behavioral/Psych: Mood is stable, no new changes  Breast:  Moderate improvement in the breast. All other systems were reviewed with the patient and are  negative.  I have reviewed the past medical history, past surgical history, social history and family history with the patient and they are unchanged from previous note.  ALLERGIES:  is allergic to penicillins and sulfonamide derivatives.  MEDICATIONS:  Current Outpatient Prescriptions  Medication Sig Dispense Refill  . Alum & Mag Hydroxide-Simeth (MAGIC MOUTHWASH) SOLN Take 5 mLs by mouth 4 (four) times daily. 5 mL 1  . atorvastatin (LIPITOR) 10 MG tablet Take 1 tablet (10 mg total) by mouth daily. 90 tablet 3  . cholecalciferol (VITAMIN D) 1000 UNITS tablet Take 1,000 Units by mouth daily.      . cholestyramine (QUESTRAN) 4 G packet Take 1 packet (4 g total) by mouth 3 (three) times daily with meals. 60 each 2  . cyanocobalamin (,VITAMIN B-12,) 1000 MCG/ML injection Inject 1 mL (1,000 mcg total) into the muscle every 30 (thirty) days. 3 mL 3  . dexamethasone (DECADRON) 4 MG tablet Take 1 tablet (4 mg total) by mouth 2 (two) times daily. Start the day before Taxotere. Then again the day after chemo for 3 days. 30 tablet 1  . diphenoxylate-atropine (LOMOTIL) 2.5-0.025 MG per tablet Take 1 tablet by mouth 4 (four) times daily as needed for diarrhea or loose stools. 60 tablet 1  . folic acid (FOLVITE) 1 MG tablet Take 1 mg by mouth daily.      Marland Kitchen lidocaine-prilocaine (EMLA) cream Apply to affected area once 30 g 3  . LORazepam (ATIVAN) 0.5 MG tablet Take 1 tablet (0.5 mg total) by mouth 3 (three) times daily as needed. for anxiety 270 tablet 1  . losartan-hydrochlorothiazide (HYZAAR) 100-25 MG per tablet Take 1 tablet by mouth daily. 90 tablet 3  . metoprolol succinate (TOPROL-XL) 50 MG 24  hr tablet Take 1 tablet (50 mg total) by mouth 2 (two) times daily. Take with or immediately following a meal. 180 tablet 2  . nystatin (MYCOSTATIN) 100000 UNIT/ML suspension     . omeprazole (PRILOSEC) 20 MG capsule Take 1 capsule (20 mg total) by mouth daily. 90 capsule 1  . ondansetron (ZOFRAN) 8 MG tablet  Take 1 tablet (8 mg total) by mouth 2 (two) times daily. Start the day after chemo for 3 days. Then take as needed for nausea or vomiting. 30 tablet 1  . oxyCODONE-acetaminophen (ROXICET) 5-325 MG per tablet Take 1-2 tablets by mouth every 4 (four) hours as needed. 50 tablet 0  . potassium chloride (MICRO-K) 10 MEQ CR capsule Take 1 capsule (10 mEq total) by mouth daily. 90 capsule 3  . prochlorperazine (COMPAZINE) 10 MG tablet Take 1 tablet (10 mg total) by mouth every 6 (six) hours as needed (Nausea or vomiting). 30 tablet 1  . terazosin (HYTRIN) 5 MG capsule Take 1 capsule (5 mg total) by mouth at bedtime. 90 capsule 3   No current facility-administered medications for this visit.    PHYSICAL EXAMINATION: ECOG PERFORMANCE STATUS: 1 - Symptomatic but completely ambulatory  Filed Vitals:   04/22/15 0912  BP: 170/72  Pulse: 106  Temp: 98.5 F (36.9 C)  Resp: 19   Filed Weights   04/22/15 0912  Weight: 214 lb 1.6 oz (97.115 kg)    GENERAL:alert, no distress and comfortable SKIN: skin color, texture, turgor are normal, no rashes or significant lesions EYES: normal, Conjunctiva are pink and non-injected, sclera clear OROPHARYNX:no exudate, no erythema and lips, buccal mucosa, and tongue normal  NECK: supple, thyroid normal size, non-tender, without nodularity LYMPH:  no palpable lymphadenopathy in the cervical, axillary or inguinal LUNGS: clear to auscultation and percussion with normal breathing effort HEART: regular rate & rhythm and no murmurs and no lower extremity edema ABDOMEN:abdomen soft, non-tender and normal bowel sounds Musculoskeletal:no cyanosis of digits and no clubbing  NEURO: alert & oriented x 3 with fluent speech, no focal motor/sensory deficits   LABORATORY DATA:  I have reviewed the data as listed   Chemistry      Component Value Date/Time   NA 146* 04/02/2015 0753   NA 138 12/26/2014 0755   K 3.4* 04/02/2015 0753   K 5.7* 12/26/2014 0755   CL 102  12/26/2014 0755   CO2 19* 04/02/2015 0753   CO2 28 11/05/2014 1456   BUN 13.5 04/02/2015 0753   BUN 45* 12/26/2014 0755   CREATININE 1.0 04/02/2015 0753   CREATININE 1.00 12/26/2014 0755      Component Value Date/Time   CALCIUM 8.3* 04/02/2015 0753   CALCIUM 9.8 11/05/2014 1456   ALKPHOS 67 04/02/2015 0753   ALKPHOS 68 11/05/2014 1456   AST 11 04/02/2015 0753   AST 15 11/05/2014 1456   ALT 12 04/02/2015 0753   ALT 14 11/05/2014 1456   BILITOT <0.20 04/02/2015 0753   BILITOT 0.4 11/05/2014 1456       Lab Results  Component Value Date   WBC 5.0 04/22/2015   HGB 10.7* 04/22/2015   HCT 31.0* 04/22/2015   MCV 102.5* 04/22/2015   PLT 140* 04/22/2015   NEUTROABS 3.3 04/22/2015   ASSESSMENT & PLAN:  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 Neoadjuvant chemotherapy with TCH Perjeta 6 cycles followed by Herceptin maintenance 2. After initial chemotherapy repeat MRI and then surgery 3. Adjuvant radiation therapy may be a consideration 4. Followed by antiestrogen therapy  Current Plan: TCH-Perjeta cycle 6 Day 1 (decreased chemotherapy dosage with cycle 2 and discontinued Perjeta for diarrhea) ECHO 12/27/14: EF 60-65%  Toxicities: 1. Diarrhea: Marked improvement after Perjeta was discontinued. 2. Mucositis: magic mouthwash (without lidocaine) to be swished and swallowed QID PRN , resolved 3. Nausea: Much improved with decrease in the dosage of chemotherapy 4. Fatigue: Getting worse with each cycle of chemotherapy 5. Elevated BP during/after taxotere. Renewed her prescription for antihypertensive med  6. Skin rash: Very profound. Resolved since stopping Perjeta 7. Tremors 8. Renal failure: IV fluids 1 week after chemotherapy 9. Anemia normocytic related  to chemotherapy complicated by bleeding from anal fissure.  transfused 1 unit of packed red cells 04/02/2015 , today's hemoglobin 04/22/2015 10.6 g 10. Thrombocytopenia platelets 94 on 04/02/2015: Okay to treat with lower dosage of chemotherapy today  Overall patient is doing significantly better since Perjeta has been discontinued.  Plan: 1. Breast MRI  04/28/2015 2. Tumor board presentation  Next Wednesday 3. Followed by surgery , patient has appointment with Dr. Marlou Starks for surgery on 05/30/2015   No orders of the defined types were placed in this encounter.   The patient has a good understanding of the overall plan. she agrees with it. she will call with any problems that may develop before the next visit here.   Rulon Eisenmenger, MD

## 2015-04-22 NOTE — Addendum Note (Signed)
Addended by: Prentiss Bells on: 04/22/2015 09:59 AM   Modules accepted: Orders, Medications

## 2015-04-22 NOTE — Patient Instructions (Signed)
[facility] Discharge Instructions for Patients Receiving Chemotherapy  Today you received the following chemotherapy agents herceptin, taxotere. Carboplatin   To help prevent nausea and vomiting after your treatment, we encourage you to take your nausea medication as directed   If you develop nausea and vomiting that is not controlled by your nausea medication, call the clinic.   BELOW ARE SYMPTOMS THAT SHOULD BE REPORTED IMMEDIATELY:  *FEVER GREATER THAN 100.5 F  *CHILLS WITH OR WITHOUT FEVER  NAUSEA AND VOMITING THAT IS NOT CONTROLLED WITH YOUR NAUSEA MEDICATION  *UNUSUAL SHORTNESS OF BREATH  *UNUSUAL BRUISING OR BLEEDING  TENDERNESS IN MOUTH AND THROAT WITH OR WITHOUT PRESENCE OF ULCERS  *URINARY PROBLEMS  *BOWEL PROBLEMS  UNUSUAL RASH Items with * indicate a potential emergency and should be followed up as soon as possible.  Feel free to call the clinic you have any questions or concerns. The clinic phone number is [phone].

## 2015-04-22 NOTE — Telephone Encounter (Signed)
pof noted and a staff message has been sent to [facility] with heart failure clinic for an echo and new patient md visit

## 2015-04-22 NOTE — Assessment & Plan Note (Signed)
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 Neoadjuvant chemotherapy with TCH Perjeta 6 cycles followed by Herceptin maintenance 2. After initial chemotherapy repeat MRI and then surgery 3. Adjuvant radiation therapy may be a consideration 4. Followed by antiestrogen therapy  Current Plan: TCH-Perjeta cycle 6 Day 1 (decreased chemotherapy dosage with cycle 2 and discontinued Perjeta for diarrhea) ECHO 12/27/14: EF 60-65%  Toxicities: 1. Diarrhea: Marked improvement after Perjeta was discontinued. 2. Mucositis: magic mouthwash (without lidocaine) to be swished and swallowed QID PRN 3. Nausea: Much improved with decrease in the dosage of chemotherapy 4. Fatigue: Getting worse with each cycle of chemotherapy 5. Elevated BP during/after taxotere. Renewed her prescription for antihypertensive med  6. Skin rash: Very profound. Resolved since stopping Perjeta 7. Tremors 8. Renal failure: IV fluids 1 week after chemotherapy 9. Anemia normocytic related to chemotherapy complicated by bleeding from anal fissure. Will transfuse 1 unit of packed red cells 04/02/2015 10. Thrombocytopenia platelets 94 on 04/02/2015: Okay to treat with lower dosage of chemotherapy today  Overall patient is doing significantly better since Perjeta has been discontinued.  Plan: 1. Breast MRI 2. Tumor board presentation 3. Followed by surgery

## 2015-04-22 NOTE — Telephone Encounter (Signed)
Appointments made and avs printed for patient °

## 2015-04-23 ENCOUNTER — Telehealth: Payer: Self-pay | Admitting: Hematology and Oncology

## 2015-04-23 NOTE — Telephone Encounter (Signed)
Called and left a message that Savannah Howard has placed her on a recall  For echo and dr bensimhon   Webb Silversmith

## 2015-04-24 ENCOUNTER — Telehealth: Payer: Self-pay

## 2015-04-24 NOTE — Telephone Encounter (Signed)
Attending Provider Statement sent to [facility] by mail.  Copy to scan.  Copy to Managed care

## 2015-04-28 ENCOUNTER — Ambulatory Visit
Admission: RE | Admit: 2015-04-28 | Discharge: 2015-04-28 | Disposition: A | Discharge status: Home / Self Care | Payer: Managed Care, Other (non HMO) | Source: Ambulatory Visit | Attending: Hematology and Oncology | Admitting: Hematology and Oncology

## 2015-04-28 DIAGNOSIS — C50912 Malignant neoplasm of unspecified site of left female breast: Secondary | ICD-10-CM | POA: Diagnosis not present

## 2015-04-28 DIAGNOSIS — C50412 Malignant neoplasm of upper-outer quadrant of left female breast: Secondary | ICD-10-CM

## 2015-04-28 MED ORDER — GADOBENATE DIMEGLUMINE 529 MG/ML IV SOLN
20.0000 mL | Freq: Once | INTRAVENOUS | Status: AC | PRN
  Administered 2015-04-28: 20 mL via INTRAVENOUS

## 2015-05-01 ENCOUNTER — Encounter: Payer: Self-pay | Admitting: Hematology and Oncology

## 2015-05-01 ENCOUNTER — Other Ambulatory Visit (HOSPITAL_BASED_OUTPATIENT_CLINIC_OR_DEPARTMENT_OTHER): Payer: Managed Care, Other (non HMO)

## 2015-05-01 ENCOUNTER — Ambulatory Visit (HOSPITAL_BASED_OUTPATIENT_CLINIC_OR_DEPARTMENT_OTHER): Payer: Managed Care, Other (non HMO) | Admitting: Hematology and Oncology

## 2015-05-01 VITALS — BP 123/68 | HR 106 | Temp 99.7°F | Resp 19 | Ht 62.0 in | Wt 204.8 lb

## 2015-05-01 DIAGNOSIS — C50412 Malignant neoplasm of upper-outer quadrant of left female breast: Secondary | ICD-10-CM

## 2015-05-01 LAB — CBC WITH DIFFERENTIAL/PLATELET
BASO%: 0.5 % (ref 0.0–2.0)
Basophils Absolute: 0 10*3/uL (ref 0.0–0.1)
EOS%: 0.1 % (ref 0.0–7.0)
Eosinophils Absolute: 0 10*3/uL (ref 0.0–0.5)
HCT: 25.2 % — ABNORMAL LOW (ref 34.8–46.6)
HEMOGLOBIN: 8.7 g/dL — AB (ref 11.6–15.9)
LYMPH%: 48.5 % (ref 14.0–49.7)
MCH: 35.5 pg — ABNORMAL HIGH (ref 25.1–34.0)
MCHC: 34.6 g/dL (ref 31.5–36.0)
MCV: 102.6 fL — ABNORMAL HIGH (ref 79.5–101.0)
MONO#: 0.6 10*3/uL (ref 0.1–0.9)
MONO%: 13.4 % (ref 0.0–14.0)
NEUT%: 37.5 % — ABNORMAL LOW (ref 38.4–76.8)
NEUTROS ABS: 1.8 10*3/uL (ref 1.5–6.5)
Platelets: 85 10*3/uL — ABNORMAL LOW (ref 145–400)
RBC: 2.45 10*6/uL — AB (ref 3.70–5.45)
RDW: 18.3 % — AB (ref 11.2–14.5)
WBC: 4.8 10*3/uL (ref 3.9–10.3)
lymph#: 2.3 10*3/uL (ref 0.9–3.3)

## 2015-05-01 LAB — COMPREHENSIVE METABOLIC PANEL (CC13)
ALBUMIN: 3.2 g/dL — AB (ref 3.5–5.0)
ALK PHOS: 73 U/L (ref 40–150)
ALT: 16 U/L (ref 0–55)
AST: 12 U/L (ref 5–34)
Anion Gap: 16 mEq/L — ABNORMAL HIGH (ref 3–11)
BILIRUBIN TOTAL: 0.56 mg/dL (ref 0.20–1.20)
BUN: 11.6 mg/dL (ref 7.0–26.0)
CO2: 23 meq/L (ref 22–29)
Calcium: 9.1 mg/dL (ref 8.4–10.4)
Chloride: 100 mEq/L (ref 98–109)
Creatinine: 1 mg/dL (ref 0.6–1.1)
EGFR: 60 mL/min/{1.73_m2} — AB (ref >90)
GLUCOSE: 157 mg/dL — AB (ref 70–140)
Potassium: 3.1 mEq/L — ABNORMAL LOW (ref 3.5–5.1)
SODIUM: 139 meq/L (ref 136–145)
TOTAL PROTEIN: 7 g/dL (ref 6.4–8.3)

## 2015-05-01 NOTE — Addendum Note (Signed)
Addended by: Prentiss Bells on: 05/01/2015 09:10 AM   Modules accepted: Medications

## 2015-05-01 NOTE — Assessment & Plan Note (Addendum)
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, no lymph nodes, grade 2-3, ER 5% PR 0% HER-2 positive ratio 6.96, Ki-67 is 80% T3 N0 M0 stage IIB clinical stage  Chemotherapy summary: TCH-Perjeta 6 cycles started 12/31/2014 completed 04/22/2015 (decreased chemotherapy dosage with cycle 2 and discontinued Perjeta for diarrhea) Breast MRI 04/29/2015: Significant interval decrease in size of the mass, continued skin retraction, 4.3 x 2 x 1.8 cm (previously 4.9 x 3.9 x 3.9 cm)  Radiology review: I discussed the breast MRI report and provided a copy of the report to the patient we also reviewed the images. Marked decrease in the size of the breast tumor but there is still easily residual disease.  Treatment plan:  1. Surgery and most likely Mastectomy is recommended based on tumor board review 2. Continue Herceptin maintenance 3. Patient will need adjuvant radiation followed by 4. Antiestrogen therapy with anastrozole 5 years  Return to clinic after surgery to discuss final pathology report.

## 2015-05-01 NOTE — Progress Notes (Signed)
Patient Care Team: [name], MD as PCP - General  DIAGNOSIS: No matching staging information was found for the patient.  SUMMARY OF ONCOLOGIC HISTORY:   Breast cancer of upper-outer quadrant of left female breast   12/07/2014 Initial Diagnosis Left breast 2:00 position: Invasive ductal carcinoma grade 2, ER 5%, PR 0%, Ki-67 80%, HER-2 positive ratio 6.96   12/07/2014 Mammogram Large left breast mass 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, 2 small benign nodules in the right breast stable since 2002, other consistent with normal intramammary lymph node left axilla ultrasound normal-sized lymph nodes   12/31/2014 - 04/22/2015 Neo-Adjuvant Chemotherapy TCH Perjeta neoadjuvant chemotherapy 6 (decreased chemotherapy dosage with cycle 2 and discontinued Perjeta for diarrhea)    04/29/2015 Breast MRI Significant interval decrease in size of the mass, continued skin retraction, 4.3 x 2 x 1.8 cm (previously 4.9 x 3.9 x 3.9 cm)    CHIEF COMPLIANT: Follow-up to discuss breast MRI  INTERVAL HISTORY: Savannah Howard is a 69 year old with above-mentioned history of left breast cancer treated with neo-adjuvant chemotherapy with [place] for [place]. She could not complete Perjeta but she finished TCH chemotherapy. She had profound diarrhea and rash related to Perjeta. She is feeling moderately fatigued from the effects of recent chemotherapy. She had a breast MRI and if she is here today discuss the results. We have presented her case in a breast tumor board earlier today she does continue to have intermittent diarrhea. It is not severe and she is able to manage it with over-the-counter medications.  REVIEW OF SYSTEMS:   Constitutional: Denies fevers, chills or abnormal weight loss Eyes: Denies blurriness of vision Ears, nose, mouth, throat, and face: Denies mucositis or sore throat Respiratory: Denies cough, dyspnea or wheezes Cardiovascular: Denies palpitation, chest discomfort or lower extremity swelling Gastrointestinal:   Intermittent diarrhea Skin: Denies abnormal skin rashes Lymphatics: Denies new lymphadenopathy or easy bruising Neurological:Denies numbness, tingling or new weaknesses Behavioral/Psych: Mood is stable, no new changes  All other systems were reviewed with the patient and are negative.  I have reviewed the past medical history, past surgical history, social history and family history with the patient and they are unchanged from previous note.  ALLERGIES:  is allergic to penicillins and sulfonamide derivatives.  MEDICATIONS:  Current Outpatient Prescriptions  Medication Sig Dispense Refill  . Alum & Mag Hydroxide-Simeth (MAGIC MOUTHWASH) SOLN Take 5 mLs by mouth 4 (four) times daily. 5 mL 1  . atorvastatin (LIPITOR) 10 MG tablet Take 1 tablet (10 mg total) by mouth daily. 90 tablet 3  . cholecalciferol (VITAMIN D) 1000 UNITS tablet Take 1,000 Units by mouth daily.      . cholestyramine (QUESTRAN) 4 G packet Take 1 packet (4 g total) by mouth 3 (three) times daily with meals. 60 each 2  . cyanocobalamin (,VITAMIN B-12,) 1000 MCG/ML injection Inject 1 mL (1,000 mcg total) into the muscle every 30 (thirty) days. 3 mL 3  . dexamethasone (DECADRON) 4 MG tablet Take 1 tablet (4 mg total) by mouth 2 (two) times daily. Start the day before Taxotere. Then again the day after chemo for 3 days. 30 tablet 1  . diphenoxylate-atropine (LOMOTIL) 2.5-0.025 MG per tablet Take 1 tablet by mouth 4 (four) times daily as needed for diarrhea or loose stools. 60 tablet 1  . folic acid (FOLVITE) 1 MG tablet Take 1 mg by mouth daily.      Marland Kitchen lidocaine-prilocaine (EMLA) cream Apply to affected area once 30 g 3  .  LORazepam (ATIVAN) 0.5 MG tablet Take 1 tablet (0.5 mg total) by mouth 3 (three) times daily as needed. for anxiety 270 tablet 1  . losartan-hydrochlorothiazide (HYZAAR) 100-25 MG per tablet Take 1 tablet by mouth daily. 90 tablet 3  . metoprolol succinate (TOPROL-XL) 50 MG 24 hr tablet Take 1 tablet (50 mg  total) by mouth 2 (two) times daily. Take with or immediately following a meal. 180 tablet 2  . nystatin (MYCOSTATIN) 100000 UNIT/ML suspension     . omeprazole (PRILOSEC) 20 MG capsule Take 1 capsule (20 mg total) by mouth daily. 90 capsule 1  . ondansetron (ZOFRAN) 8 MG tablet Take 1 tablet (8 mg total) by mouth 2 (two) times daily. Start the day after chemo for 3 days. Then take as needed for nausea or vomiting. 30 tablet 1  . oxyCODONE-acetaminophen (ROXICET) 5-325 MG per tablet Take 1-2 tablets by mouth every 4 (four) hours as needed. 50 tablet 0  . potassium chloride (MICRO-K) 10 MEQ CR capsule Take 1 capsule (10 mEq total) by mouth daily. 90 capsule 3  . prochlorperazine (COMPAZINE) 10 MG tablet Take 1 tablet (10 mg total) by mouth every 6 (six) hours as needed (Nausea or vomiting). 30 tablet 1  . terazosin (HYTRIN) 5 MG capsule Take 1 capsule (5 mg total) by mouth at bedtime. 90 capsule 3   No current facility-administered medications for this visit.    PHYSICAL EXAMINATION: ECOG PERFORMANCE STATUS: 1 - Symptomatic but completely ambulatory  Filed Vitals:   05/01/15 0832  BP: 123/68  Pulse: 106  Temp: 99.7 F (37.6 C)  Resp: 19   Filed Weights   05/01/15 0832  Weight: 204 lb 12.8 oz (92.897 kg)    GENERAL:alert, no distress and comfortable SKIN: skin color, texture, turgor are normal, no rashes or significant lesions EYES: normal, Conjunctiva are pink and non-injected, sclera clear OROPHARYNX:no exudate, no erythema and lips, buccal mucosa, and tongue normal  NECK: supple, thyroid normal size, non-tender, without nodularity LYMPH:  no palpable lymphadenopathy in the cervical, axillary or inguinal LUNGS: clear to auscultation and percussion with normal breathing effort HEART: regular rate & rhythm and no murmurs and no lower extremity edema ABDOMEN:abdomen soft, non-tender and normal bowel sounds Musculoskeletal: Lower extremity weakness NEURO: alert & oriented x 3 with  fluent speech, grade 1 peripheral neuropathy  LABORATORY DATA:  I have reviewed the data as listed   Chemistry      Component Value Date/Time   NA 143 04/22/2015 0851   NA 138 12/26/2014 0755   K 4.1 04/22/2015 0851   K 5.7* 12/26/2014 0755   CL 102 12/26/2014 0755   CO2 19* 04/22/2015 0851   CO2 28 11/05/2014 1456   BUN 10.6 04/22/2015 0851   BUN 45* 12/26/2014 0755   CREATININE 1.0 04/22/2015 0851   CREATININE 1.00 12/26/2014 0755      Component Value Date/Time   CALCIUM 9.5 04/22/2015 0851   CALCIUM 9.8 11/05/2014 1456   ALKPHOS 78 04/22/2015 0851   ALKPHOS 68 11/05/2014 1456   AST 12 04/22/2015 0851   AST 15 11/05/2014 1456   ALT 13 04/22/2015 0851   ALT 14 11/05/2014 1456   BILITOT 0.52 04/22/2015 0851   BILITOT 0.4 11/05/2014 1456       Lab Results  Component Value Date   WBC 4.8 05/01/2015   HGB 8.7* 05/01/2015   HCT 25.2* 05/01/2015   MCV 102.6* 05/01/2015   PLT 85* 05/01/2015   NEUTROABS 1.8 05/01/2015  ASSESSMENT & PLAN:  Breast cancer of upper-outer quadrant of left female breast 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, no lymph nodes, grade 2-3, ER 5% PR 0% HER-2 positive ratio 6.96, Ki-67 is 80% T3 N0 M0 stage IIB clinical stage  Chemotherapy summary: TCH-Perjeta 6 cycles started 12/31/2014 completed 04/22/2015 (decreased chemotherapy dosage with cycle 2 and discontinued Perjeta for diarrhea) Breast MRI 04/29/2015: Significant interval decrease in size of the mass, continued skin retraction, 4.3 x 2 x 1.8 cm (previously 4.9 x 3.9 x 3.9 cm)  Radiology review: I discussed the breast MRI report and provided a copy of the report to the patient we also reviewed the images. Marked decrease in the size of the breast tumor but there is still easily residual disease.  Treatment plan:  1. Surgery and most likely Mastectomy is recommended based on tumor board review 2. Continue Herceptin maintenance 3. Patient will need  adjuvant radiation followed by 4. Antiestrogen therapy with anastrozole 5 years  I reviewed her blood work. She is once again anemic and mildly thrombocytopenic. Since she will not get any further chemotherapy, I expect her blood counts to improve slowly over time. There is no indication for blood transfusion.  Intermittent diarrhea: Patient in for the management fairly well with Imodium. Return to clinic after surgery to discuss final pathology report.   No orders of the defined types were placed in this encounter.   The patient has a good understanding of the overall plan. she agrees with it. she will call with any problems that may develop before the next visit here.   Rulon Eisenmenger, MD

## 2015-05-10 ENCOUNTER — Other Ambulatory Visit: Payer: Self-pay | Admitting: Hematology and Oncology

## 2015-05-10 DIAGNOSIS — C50412 Malignant neoplasm of upper-outer quadrant of left female breast: Secondary | ICD-10-CM

## 2015-05-13 ENCOUNTER — Ambulatory Visit: Payer: Managed Care, Other (non HMO)

## 2015-05-13 ENCOUNTER — Ambulatory Visit (HOSPITAL_COMMUNITY)
Admission: RE | Admit: 2015-05-13 | Discharge: 2015-05-13 | Disposition: A | Discharge status: Home / Self Care | Payer: Managed Care, Other (non HMO) | Source: Ambulatory Visit | Attending: Hematology and Oncology | Admitting: Hematology and Oncology

## 2015-05-13 ENCOUNTER — Ambulatory Visit (HOSPITAL_BASED_OUTPATIENT_CLINIC_OR_DEPARTMENT_OTHER): Payer: Managed Care, Other (non HMO)

## 2015-05-13 ENCOUNTER — Other Ambulatory Visit: Payer: Self-pay | Admitting: *Deleted

## 2015-05-13 VITALS — BP 162/70 | HR 83 | Temp 97.6°F | Resp 19

## 2015-05-13 DIAGNOSIS — Z5112 Encounter for antineoplastic immunotherapy: Secondary | ICD-10-CM

## 2015-05-13 DIAGNOSIS — C50412 Malignant neoplasm of upper-outer quadrant of left female breast: Secondary | ICD-10-CM | POA: Insufficient documentation

## 2015-05-13 LAB — CBC WITH DIFFERENTIAL/PLATELET
BASO%: 0.2 % (ref 0.0–2.0)
Basophils Absolute: 0 10*3/uL (ref 0.0–0.1)
EOS%: 0 % (ref 0.0–7.0)
Eosinophils Absolute: 0 10*3/uL (ref 0.0–0.5)
HCT: 22.1 % — ABNORMAL LOW (ref 34.8–46.6)
HEMOGLOBIN: 7.5 g/dL — AB (ref 11.6–15.9)
LYMPH%: 34.6 % (ref 14.0–49.7)
MCH: 35.8 pg — ABNORMAL HIGH (ref 25.1–34.0)
MCHC: 34.1 g/dL (ref 31.5–36.0)
MCV: 105 fL — ABNORMAL HIGH (ref 79.5–101.0)
MONO#: 0.1 10*3/uL (ref 0.1–0.9)
MONO%: 3.4 % (ref 0.0–14.0)
NEUT%: 61.8 % (ref 38.4–76.8)
NEUTROS ABS: 1.7 10*3/uL (ref 1.5–6.5)
Platelets: 44 10*3/uL — ABNORMAL LOW (ref 145–400)
RBC: 2.11 10*6/uL — AB (ref 3.70–5.45)
RDW: 18.3 % — AB (ref 11.2–14.5)
WBC: 2.7 10*3/uL — AB (ref 3.9–10.3)
lymph#: 0.9 10*3/uL (ref 0.9–3.3)

## 2015-05-13 LAB — HOLD TUBE, BLOOD BANK

## 2015-05-13 LAB — PREPARE RBC (CROSSMATCH)

## 2015-05-13 MED ORDER — SODIUM CHLORIDE 0.9 % IJ SOLN
10.0000 mL | INTRAMUSCULAR | Status: DC | PRN
  Administered 2015-05-13: 10 mL
  Filled 2015-05-13: qty 10

## 2015-05-13 MED ORDER — HEPARIN SOD (PORK) LOCK FLUSH 100 UNIT/ML IV SOLN
500.0000 [IU] | Freq: Once | INTRAVENOUS | Status: AC | PRN
  Administered 2015-05-13: 500 [IU]
  Filled 2015-05-13: qty 5

## 2015-05-13 MED ORDER — ACETAMINOPHEN 325 MG PO TABS
650.0000 mg | ORAL_TABLET | Freq: Once | ORAL | Status: AC
  Administered 2015-05-13: 650 mg via ORAL

## 2015-05-13 MED ORDER — DIPHENHYDRAMINE HCL 25 MG PO CAPS
50.0000 mg | ORAL_CAPSULE | Freq: Once | ORAL | Status: AC
  Administered 2015-05-13: 50 mg via ORAL

## 2015-05-13 MED ORDER — TRASTUZUMAB CHEMO INJECTION 440 MG
6.0000 mg/kg | Freq: Once | INTRAVENOUS | Status: AC
  Administered 2015-05-13: 567 mg via INTRAVENOUS
  Filled 2015-05-13: qty 27

## 2015-05-13 MED ORDER — ACETAMINOPHEN 325 MG PO TABS
ORAL_TABLET | ORAL | Status: AC
  Filled 2015-05-13: qty 2

## 2015-05-13 MED ORDER — DIPHENHYDRAMINE HCL 25 MG PO CAPS
ORAL_CAPSULE | ORAL | Status: AC
  Filled 2015-05-13: qty 2

## 2015-05-13 MED ORDER — SODIUM CHLORIDE 0.9 % IV SOLN
Freq: Once | INTRAVENOUS | Status: AC
  Administered 2015-05-13: 09:00:00 via INTRAVENOUS

## 2015-05-13 NOTE — Progress Notes (Signed)
1005- Upon arrival, pt stated she was dizzy; CBC placed, hgb 7.5, platelets 44. CBC reviewed in detail with Dr. Lindi Adie; per Dr. Lindi Adie, transfuse 2 units PRBCs this week, no platelets; tyl 650 for pre-med. Pt type/cross drawn here in infusion. Pt and son voice understanding to arrive tomorrow 10/4 at [facility] at 9am for transfusion and to keep blue bracelet on.

## 2015-05-13 NOTE — Patient Instructions (Signed)
[facility] Discharge Instructions for Patients Receiving Chemotherapy  Today you received the following chemotherapy agents Herceptin.  To help prevent nausea and vomiting after your treatment, we encourage you to take your nausea medication as prescribed by your physician. If you develop nausea and vomiting that is not controlled by your nausea medication, call the clinic.   BELOW ARE SYMPTOMS THAT SHOULD BE REPORTED IMMEDIATELY:  *FEVER GREATER THAN 100.5 F  *CHILLS WITH OR WITHOUT FEVER  NAUSEA AND VOMITING THAT IS NOT CONTROLLED WITH YOUR NAUSEA MEDICATION  *UNUSUAL SHORTNESS OF BREATH  *UNUSUAL BRUISING OR BLEEDING  TENDERNESS IN MOUTH AND THROAT WITH OR WITHOUT PRESENCE OF ULCERS  *URINARY PROBLEMS  *BOWEL PROBLEMS  UNUSUAL RASH Items with * indicate a potential emergency and should be followed up as soon as possible.  Feel free to call the clinic you have any questions or concerns. The clinic phone number is [phone].  Please show the CHEMO ALERT CARD at check-in to the Emergency Department and triage nurse.   

## 2015-05-14 ENCOUNTER — Ambulatory Visit (HOSPITAL_COMMUNITY)
Admission: RE | Admit: 2015-05-14 | Discharge: 2015-05-14 | Disposition: A | Discharge status: Home / Self Care | Payer: Managed Care, Other (non HMO) | Source: Ambulatory Visit | Attending: Hematology and Oncology | Admitting: Hematology and Oncology

## 2015-05-14 VITALS — BP 159/71 | HR 70 | Temp 98.1°F | Resp 20

## 2015-05-14 DIAGNOSIS — C50412 Malignant neoplasm of upper-outer quadrant of left female breast: Secondary | ICD-10-CM | POA: Diagnosis not present

## 2015-05-14 MED ORDER — SODIUM CHLORIDE 0.9 % IV SOLN
250.0000 mL | Freq: Once | INTRAVENOUS | Status: AC
  Administered 2015-05-14: 250 mL via INTRAVENOUS

## 2015-05-14 MED ORDER — SODIUM CHLORIDE 0.9 % IJ SOLN
10.0000 mL | INTRAMUSCULAR | Status: AC | PRN
  Administered 2015-05-14: 10 mL

## 2015-05-14 MED ORDER — ACETAMINOPHEN 325 MG PO TABS
650.0000 mg | ORAL_TABLET | Freq: Once | ORAL | Status: AC
Start: 2015-05-14 — End: 2015-05-14
  Administered 2015-05-14: 650 mg via ORAL
  Filled 2015-05-14: qty 2

## 2015-05-14 MED ORDER — HEPARIN SOD (PORK) LOCK FLUSH 100 UNIT/ML IV SOLN
500.0000 [IU] | Freq: Every day | INTRAVENOUS | Status: AC | PRN
  Administered 2015-05-14: 500 [IU]
  Filled 2015-05-14: qty 5

## 2015-05-14 NOTE — Procedures (Signed)
Associated diagnosis: breast cancer upper outer quadrant of left female breast MD: Lindi Adie  Procedure Note: Infusion 2 unit PRB's, port accessed,blood return noted, and de accessed per protocol Condition during procedure: Tolerated well Condition after procedure: Alert, oriented, and ambulatory

## 2015-05-15 LAB — TYPE AND SCREEN
ABO/RH(D): O POS
ANTIBODY SCREEN: NEGATIVE
UNIT DIVISION: 0
UNIT DIVISION: 0

## 2015-05-21 NOTE — Pre-Procedure Instructions (Signed)
Savannah Howard  05/21/2015      [place] [place] [address] Phone: [phone] Fax: [phone]  [id number] [place], [place] [address] [place] [address] Phone: [phone] Fax: [place], [place] [address] Phone: [phone] Fax: [phone]    Your procedure is scheduled on Thurs, Oct 20 @ 7:30 AM  Report to [facility] Admitting at 5:30 AM  Call this number if you have problems the morning of surgery:  [phone]   Remember:  Do not eat food or drink liquids after midnight.  Take these medicines the morning of surgery with A SIP OF WATER Ativan(Lorazepam),Metoprolol(Toprol),Omeprazole(Prilosec),Zofran(Ondansetron-if needed),and Pain Pill(if needed)               Stop taking your Vitamins or Herbal Medications. No Goody's,BC's,Aleve,Aspirin,Ibuprofen,or Fish Oil.   Do not wear jewelry, make-up or nail polish.  Do not wear lotions, powders, or perfumes.    Do not shave 48 hours prior to surgery.    Do not bring valuables to the hospital.  [facility] is not responsible for any belongings or valuables.  Contacts, dentures or bridgework may not be worn into surgery.  Leave your suitcase in the car.  After surgery it may be brought to your room.  For patients admitted to the hospital, discharge time will be determined by your treatment team.  Patients discharged the day of surgery will not be allowed to drive home.    Special instructions:  [facility] - Preparing for Surgery  Before surgery, you can play an important role.  Because skin is not sterile, your skin needs to be as free of germs as possible.  You can reduce the number of germs on you skin by washing with CHG (chlorahexidine gluconate) soap before surgery.  CHG is an antiseptic  cleaner which kills germs and bonds with the skin to continue killing germs even after washing.  Please DO NOT use if you have an allergy to CHG or antibacterial soaps.  If your skin becomes reddened/irritated stop using the CHG and inform your nurse when you arrive at Short Stay.  Do not shave (including legs and underarms) for at least 48 hours prior to the first CHG shower.  You may shave your face.  Please follow these instructions carefully:   1.  Shower with CHG Soap the night before surgery and the                                morning of Surgery.  2.  If you choose to wash your hair, wash your hair first as usual with your       normal shampoo.  3.  After you shampoo, rinse your hair and body thoroughly to remove the                      Shampoo.  4.  Use CHG as you would any other liquid soap.  You can apply chg directly       to the skin and wash gently with scrungie or a clean washcloth.  5.  Apply the CHG Soap to your body ONLY FROM THE NECK DOWN.        Do not use on open  wounds or open sores.  Avoid contact with your eyes,       ears, mouth and genitals (private parts).  Wash genitals (private parts)       with your normal soap.  6.  Wash thoroughly, paying special attention to the area where your surgery        will be performed.  7.  Thoroughly rinse your body with warm water from the neck down.  8.  DO NOT shower/wash with your normal soap after using and rinsing off       the CHG Soap.  9.  Pat yourself dry with a clean towel.            10.  Wear clean pajamas.            11.  Place clean sheets on your bed the night of your first shower and do not        sleep with pets.  Day of Surgery  Do not apply any lotions/deoderants the morning of surgery.  Please wear clean clothes to the hospital/surgery center.    Please read over the following fact sheets that you were given. Pain Booklet, Coughing and Deep Breathing and Surgical Site Infection Prevention

## 2015-05-22 ENCOUNTER — Encounter (HOSPITAL_COMMUNITY)
Admission: RE | Admit: 2015-05-22 | Discharge: 2015-05-22 | Disposition: A | Discharge status: Home / Self Care | Payer: Managed Care, Other (non HMO) | Source: Ambulatory Visit | Attending: General Surgery | Admitting: General Surgery

## 2015-05-22 ENCOUNTER — Encounter (HOSPITAL_COMMUNITY): Payer: Self-pay

## 2015-05-22 DIAGNOSIS — Z01818 Encounter for other preprocedural examination: Secondary | ICD-10-CM | POA: Diagnosis not present

## 2015-05-22 DIAGNOSIS — K219 Gastro-esophageal reflux disease without esophagitis: Secondary | ICD-10-CM | POA: Insufficient documentation

## 2015-05-22 DIAGNOSIS — I10 Essential (primary) hypertension: Secondary | ICD-10-CM | POA: Insufficient documentation

## 2015-05-22 DIAGNOSIS — Z79899 Other long term (current) drug therapy: Secondary | ICD-10-CM | POA: Insufficient documentation

## 2015-05-22 DIAGNOSIS — Z01812 Encounter for preprocedural laboratory examination: Secondary | ICD-10-CM | POA: Diagnosis not present

## 2015-05-22 DIAGNOSIS — C50912 Malignant neoplasm of unspecified site of left female breast: Secondary | ICD-10-CM | POA: Diagnosis not present

## 2015-05-22 DIAGNOSIS — R9431 Abnormal electrocardiogram [ECG] [EKG]: Secondary | ICD-10-CM | POA: Insufficient documentation

## 2015-05-22 LAB — BASIC METABOLIC PANEL
ANION GAP: 19 — AB (ref 5–15)
BUN: 16 mg/dL (ref 6–20)
CHLORIDE: 101 mmol/L (ref 101–111)
CO2: 19 mmol/L — ABNORMAL LOW (ref 22–32)
Calcium: 9.6 mg/dL (ref 8.9–10.3)
Creatinine, Ser: 1.32 mg/dL — ABNORMAL HIGH (ref 0.44–1.00)
GFR calc Af Amer: 46 mL/min — ABNORMAL LOW (ref >60)
GFR calc non Af Amer: 40 mL/min — ABNORMAL LOW (ref >60)
Glucose, Bld: 134 mg/dL — ABNORMAL HIGH (ref 65–99)
POTASSIUM: 3.9 mmol/L (ref 3.5–5.1)
SODIUM: 139 mmol/L (ref 135–145)

## 2015-05-22 LAB — CBC
HEMATOCRIT: 33.3 % — AB (ref 36.0–46.0)
HEMOGLOBIN: 11.1 g/dL — AB (ref 12.0–15.0)
MCH: 34.8 pg — ABNORMAL HIGH (ref 26.0–34.0)
MCHC: 33.3 g/dL (ref 30.0–36.0)
MCV: 104.4 fL — AB (ref 78.0–100.0)
Platelets: 127 10*3/uL — ABNORMAL LOW (ref 150–400)
RBC: 3.19 MIL/uL — AB (ref 3.87–5.11)
RDW: 18.1 % — AB (ref 11.5–15.5)
WBC: 6.3 10*3/uL (ref 4.0–10.5)

## 2015-05-23 NOTE — Progress Notes (Signed)
Anesthesia Chart Review:  Pt is 69 year old female scheduled for L mastectomy with sentinel lymph node biopsy on 05/30/2015 with Dr. Marlou Starks.   PMH includes: HTN, breast cancer, GERD. Never smoker. BMI 36.   Medications include: Dexamethasone, prilosec, potassium, terazosin.   Preoperative labs reviewed.    1 view Chest x-ray 12/31/2014 reviewed. Port-A-Cath tip in superior vena cava. Lungs clear. No pneumothorax.  EKG 05/22/2015: Sinus rhythm with PACs. LVH with repolarization abnormality  Echo 04/03/2015:  - Left ventricle: The cavity size was normal. There was mild concentric hypertrophy. Systolic function was normal. The estimated ejection fraction was in the range of 60% to 65%. Wall motion was normal; there were no regional wall motion abnormalities. Doppler parameters are consistent with abnormal left ventricular relaxation (grade 1 diastolic dysfunction). Global longitudinal strain: -19.8%, prior echo -22.5% - Mitral valve: Calcified annulus. - Left atrium: The atrium was mildly dilated.  If no changes, I anticipate pt can proceed with surgery as scheduled.   Willeen Cass, FNP-BC [facility]/Anesthesiology Phone: [phone] 05/23/2015 1:45 PM

## 2015-05-29 NOTE — Anesthesia Preprocedure Evaluation (Addendum)
Anesthesia Evaluation  Patient identified by MRN, date of birth, ID band Patient awake    Reviewed: Allergy & Precautions  History of Anesthesia Complications Negative for: history of anesthetic complications  Airway Mallampati: II  TM Distance: >3 FB Neck ROM: Full    Dental  (+) Teeth Intact, Dental Advisory Given   Pulmonary neg pulmonary ROS,    Pulmonary exam normal        Cardiovascular hypertension, Pt. on medications Normal cardiovascular exam     Neuro/Psych PSYCHIATRIC DISORDERS Anxiety negative neurological ROS     GI/Hepatic Neg liver ROS, GERD  ,  Endo/Other  Morbid obesity  Renal/GU Renal InsufficiencyRenal disease     Musculoskeletal   Abdominal   Peds  Hematology   Anesthesia Other Findings   Reproductive/Obstetrics                           Anesthesia Physical Anesthesia Plan  ASA: III  Anesthesia Plan: General   Post-op Pain Management: GA combined w/ Regional for post-op pain   Induction: Intravenous  Airway Management Planned: LMA  Additional Equipment:   Intra-op Plan:   Post-operative Plan: Extubation in OR  Informed Consent: I have reviewed the patients History and Physical, chart, labs and discussed the procedure including the risks, benefits and alternatives for the proposed anesthesia with the patient or authorized representative who has indicated his/her understanding and acceptance.   Dental advisory given  Plan Discussed with: Anesthesiologist, CRNA and Surgeon  Anesthesia Plan Comments:        Anesthesia Quick Evaluation

## 2015-05-30 ENCOUNTER — Ambulatory Visit (HOSPITAL_COMMUNITY)
Admission: RE | Admit: 2015-05-30 | Discharge: 2015-05-31 | Disposition: A | Discharge status: Home / Self Care | Payer: Managed Care, Other (non HMO) | Source: Ambulatory Visit | Attending: General Surgery | Admitting: General Surgery

## 2015-05-30 ENCOUNTER — Ambulatory Visit (HOSPITAL_COMMUNITY): Payer: Managed Care, Other (non HMO) | Admitting: Emergency Medicine

## 2015-05-30 ENCOUNTER — Ambulatory Visit (HOSPITAL_COMMUNITY): Payer: Managed Care, Other (non HMO) | Admitting: Anesthesiology

## 2015-05-30 ENCOUNTER — Ambulatory Visit (HOSPITAL_COMMUNITY)
Admission: RE | Admit: 2015-05-30 | Discharge: 2015-05-30 | Disposition: A | Discharge status: Home / Self Care | Payer: Managed Care, Other (non HMO) | Source: Ambulatory Visit | Attending: General Surgery | Admitting: General Surgery

## 2015-05-30 ENCOUNTER — Encounter (HOSPITAL_COMMUNITY)
Admission: RE | Disposition: A | Discharge status: Home / Self Care | Payer: Self-pay | Source: Ambulatory Visit | Attending: General Surgery

## 2015-05-30 ENCOUNTER — Encounter (HOSPITAL_COMMUNITY): Payer: Self-pay | Admitting: Anesthesiology

## 2015-05-30 DIAGNOSIS — C50912 Malignant neoplasm of unspecified site of left female breast: Secondary | ICD-10-CM | POA: Diagnosis present

## 2015-05-30 DIAGNOSIS — I10 Essential (primary) hypertension: Secondary | ICD-10-CM | POA: Diagnosis not present

## 2015-05-30 DIAGNOSIS — C50412 Malignant neoplasm of upper-outer quadrant of left female breast: Secondary | ICD-10-CM

## 2015-05-30 DIAGNOSIS — G8918 Other acute postprocedural pain: Secondary | ICD-10-CM | POA: Diagnosis not present

## 2015-05-30 HISTORY — DX: Personal history of other medical treatment: Z92.89

## 2015-05-30 HISTORY — DX: Cardiac murmur, unspecified: R01.1

## 2015-05-30 HISTORY — DX: Other intervertebral disc degeneration, lumbar region without mention of lumbar back pain or lower extremity pain: M51.369

## 2015-05-30 HISTORY — DX: Malignant neoplasm of upper-outer quadrant of left female breast: C50.412

## 2015-05-30 HISTORY — DX: Calculus of kidney: N20.0

## 2015-05-30 HISTORY — PX: MASTECTOMY W/ SENTINEL NODE BIOPSY: SHX2001

## 2015-05-30 HISTORY — DX: Other intervertebral disc degeneration, lumbar region: M51.36

## 2015-05-30 HISTORY — PX: MASTECTOMY COMPLETE / SIMPLE W/ SENTINEL NODE BIOPSY: SUR846

## 2015-05-30 HISTORY — DX: Anemia, unspecified: D64.9

## 2015-05-30 SURGERY — MASTECTOMY WITH SENTINEL LYMPH NODE BIOPSY
Anesthesia: General | Site: Breast | Laterality: Left

## 2015-05-30 MED ORDER — PHENYLEPHRINE 40 MCG/ML (10ML) SYRINGE FOR IV PUSH (FOR BLOOD PRESSURE SUPPORT)
PREFILLED_SYRINGE | INTRAVENOUS | Status: AC
  Filled 2015-05-30: qty 10

## 2015-05-30 MED ORDER — DEXAMETHASONE SODIUM PHOSPHATE 10 MG/ML IJ SOLN
INTRAMUSCULAR | Status: DC | PRN
  Administered 2015-05-30: 10 mg via INTRAVENOUS

## 2015-05-30 MED ORDER — EPHEDRINE SULFATE 50 MG/ML IJ SOLN
INTRAMUSCULAR | Status: AC
  Filled 2015-05-30: qty 1

## 2015-05-30 MED ORDER — MIDAZOLAM HCL 5 MG/5ML IJ SOLN
INTRAMUSCULAR | Status: DC | PRN
  Administered 2015-05-30 (×2): 1 mg via INTRAVENOUS

## 2015-05-30 MED ORDER — METHYLENE BLUE 1 % INJ SOLN
INTRAMUSCULAR | Status: AC
  Filled 2015-05-30: qty 10

## 2015-05-30 MED ORDER — MORPHINE SULFATE (PF) 2 MG/ML IV SOLN
1.0000 mg | INTRAVENOUS | Status: DC | PRN
  Administered 2015-05-30: 2 mg via INTRAVENOUS
  Filled 2015-05-30: qty 1

## 2015-05-30 MED ORDER — SODIUM CHLORIDE 0.9 % IJ SOLN
INTRAMUSCULAR | Status: AC
  Filled 2015-05-30: qty 10

## 2015-05-30 MED ORDER — PANTOPRAZOLE SODIUM 40 MG PO TBEC
40.0000 mg | DELAYED_RELEASE_TABLET | Freq: Every day | ORAL | Status: DC
  Administered 2015-05-31: 40 mg via ORAL
  Filled 2015-05-30: qty 1

## 2015-05-30 MED ORDER — ONDANSETRON HCL 4 MG/2ML IJ SOLN
4.0000 mg | Freq: Four times a day (QID) | INTRAMUSCULAR | Status: DC | PRN
  Administered 2015-05-30: 4 mg via INTRAVENOUS
  Filled 2015-05-30: qty 2

## 2015-05-30 MED ORDER — POTASSIUM CHLORIDE CRYS ER 10 MEQ PO TBCR
10.0000 meq | EXTENDED_RELEASE_TABLET | Freq: Every day | ORAL | Status: DC
  Administered 2015-05-30 – 2015-05-31 (×2): 10 meq via ORAL
  Filled 2015-05-30 (×2): qty 1

## 2015-05-30 MED ORDER — VANCOMYCIN HCL IN DEXTROSE 1-5 GM/200ML-% IV SOLN
1000.0000 mg | INTRAVENOUS | Status: AC
  Administered 2015-05-30: 1000 mg via INTRAVENOUS

## 2015-05-30 MED ORDER — KCL IN DEXTROSE-NACL 20-5-0.9 MEQ/L-%-% IV SOLN
INTRAVENOUS | Status: DC
  Administered 2015-05-30 – 2015-05-31 (×2): via INTRAVENOUS
  Filled 2015-05-30 (×3): qty 1000

## 2015-05-30 MED ORDER — LIDOCAINE HCL (CARDIAC) 20 MG/ML IV SOLN
INTRAVENOUS | Status: AC
  Filled 2015-05-30: qty 5

## 2015-05-30 MED ORDER — CHLORHEXIDINE GLUCONATE 4 % EX LIQD: 1.0000 "application " | Freq: Once | CUTANEOUS | Status: DC

## 2015-05-30 MED ORDER — PROPOFOL 10 MG/ML IV BOLUS
INTRAVENOUS | Status: AC
  Filled 2015-05-30: qty 20

## 2015-05-30 MED ORDER — LORAZEPAM 0.5 MG PO TABS: 0.5000 mg | ORAL_TABLET | Freq: Four times a day (QID) | ORAL | Status: DC | PRN

## 2015-05-30 MED ORDER — MIDAZOLAM HCL 2 MG/2ML IJ SOLN
INTRAMUSCULAR | Status: AC
  Filled 2015-05-30: qty 4

## 2015-05-30 MED ORDER — ROCURONIUM BROMIDE 50 MG/5ML IV SOLN
INTRAVENOUS | Status: AC
  Filled 2015-05-30: qty 1

## 2015-05-30 MED ORDER — FENTANYL CITRATE (PF) 100 MCG/2ML IJ SOLN
INTRAMUSCULAR | Status: DC | PRN
  Administered 2015-05-30 (×3): 25 ug via INTRAVENOUS
  Administered 2015-05-30: 50 ug via INTRAVENOUS
  Administered 2015-05-30: 25 ug via INTRAVENOUS
  Administered 2015-05-30: 50 ug via INTRAVENOUS

## 2015-05-30 MED ORDER — ONDANSETRON 4 MG PO TBDP: 4.0000 mg | ORAL_TABLET | Freq: Four times a day (QID) | ORAL | Status: DC | PRN

## 2015-05-30 MED ORDER — BOOST / RESOURCE BREEZE PO LIQD
1.0000 | Freq: Three times a day (TID) | ORAL | Status: DC
  Administered 2015-05-31: 1 via ORAL

## 2015-05-30 MED ORDER — PROMETHAZINE HCL 25 MG/ML IJ SOLN: 6.2500 mg | INTRAMUSCULAR | Status: DC | PRN

## 2015-05-30 MED ORDER — HYDROMORPHONE HCL 1 MG/ML IJ SOLN: 0.2500 mg | INTRAMUSCULAR | Status: DC | PRN

## 2015-05-30 MED ORDER — FENTANYL CITRATE (PF) 250 MCG/5ML IJ SOLN
INTRAMUSCULAR | Status: AC
  Filled 2015-05-30: qty 5

## 2015-05-30 MED ORDER — TERAZOSIN HCL 5 MG PO CAPS
5.0000 mg | ORAL_CAPSULE | Freq: Every day | ORAL | Status: DC
  Administered 2015-05-30: 5 mg via ORAL
  Filled 2015-05-30 (×2): qty 1

## 2015-05-30 MED ORDER — ONDANSETRON HCL 4 MG/2ML IJ SOLN
INTRAMUSCULAR | Status: AC
  Filled 2015-05-30: qty 2

## 2015-05-30 MED ORDER — EPHEDRINE SULFATE 50 MG/ML IJ SOLN
INTRAMUSCULAR | Status: DC | PRN
  Administered 2015-05-30 (×2): 10 mg via INTRAVENOUS

## 2015-05-30 MED ORDER — ONDANSETRON HCL 4 MG/2ML IJ SOLN
INTRAMUSCULAR | Status: DC | PRN
  Administered 2015-05-30: 4 mg via INTRAVENOUS

## 2015-05-30 MED ORDER — SUCCINYLCHOLINE CHLORIDE 20 MG/ML IJ SOLN
INTRAMUSCULAR | Status: AC
  Filled 2015-05-30: qty 1

## 2015-05-30 MED ORDER — DEXAMETHASONE SODIUM PHOSPHATE 10 MG/ML IJ SOLN
INTRAMUSCULAR | Status: AC
  Filled 2015-05-30: qty 1

## 2015-05-30 MED ORDER — OXYCODONE-ACETAMINOPHEN 5-325 MG PO TABS
1.0000 | ORAL_TABLET | ORAL | Status: DC | PRN
  Administered 2015-05-30 – 2015-05-31 (×2): 2 via ORAL
  Filled 2015-05-30 (×2): qty 2

## 2015-05-30 MED ORDER — TECHNETIUM TC 99M SULFUR COLLOID FILTERED: 1.0000 | Freq: Once | INTRAVENOUS | Status: DC | PRN

## 2015-05-30 MED ORDER — PANTOPRAZOLE SODIUM 40 MG IV SOLR: 40.0000 mg | Freq: Every day | INTRAVENOUS | Status: DC

## 2015-05-30 MED ORDER — 0.9 % SODIUM CHLORIDE (POUR BTL) OPTIME
TOPICAL | Status: DC | PRN
  Administered 2015-05-30: 2000 mL

## 2015-05-30 MED ORDER — PROPOFOL 10 MG/ML IV BOLUS
INTRAVENOUS | Status: DC | PRN
  Administered 2015-05-30: 200 mg via INTRAVENOUS

## 2015-05-30 MED ORDER — SODIUM CHLORIDE 0.9 % IJ SOLN
INTRAMUSCULAR | Status: AC
  Filled 2015-05-30: qty 3

## 2015-05-30 MED ORDER — ROPIVACAINE HCL 5 MG/ML IJ SOLN
INTRAMUSCULAR | Status: DC | PRN
  Administered 2015-05-30: 30 mg

## 2015-05-30 MED ORDER — LACTATED RINGERS IV SOLN
INTRAVENOUS | Status: DC | PRN
  Administered 2015-05-30 (×2): via INTRAVENOUS

## 2015-05-30 MED ORDER — VANCOMYCIN HCL IN DEXTROSE 1-5 GM/200ML-% IV SOLN
INTRAVENOUS | Status: AC
  Filled 2015-05-30: qty 200

## 2015-05-30 MED ORDER — HEPARIN SODIUM (PORCINE) 5000 UNIT/ML IJ SOLN
5000.0000 [IU] | Freq: Three times a day (TID) | INTRAMUSCULAR | Status: DC
  Administered 2015-05-31: 5000 [IU] via SUBCUTANEOUS
  Filled 2015-05-30: qty 1

## 2015-05-30 SURGICAL SUPPLY — 52 items
APPLIER CLIP 9.375 MED OPEN (MISCELLANEOUS) ×6
APR CLP MED 9.3 20 MLT OPN (MISCELLANEOUS) ×2
BINDER BREAST LRG (GAUZE/BANDAGES/DRESSINGS) IMPLANT
BINDER BREAST XLRG (GAUZE/BANDAGES/DRESSINGS) ×2 IMPLANT
BIOPATCH RED 1 DISK 7.0 (GAUZE/BANDAGES/DRESSINGS) ×2 IMPLANT
BIOPATCH RED 1IN DISK 7.0MM (GAUZE/BANDAGES/DRESSINGS) ×2
CANISTER SUCTION 2500CC (MISCELLANEOUS) ×6 IMPLANT
CHLORAPREP W/TINT 26ML (MISCELLANEOUS) ×3 IMPLANT
CLIP APPLIE 9.375 MED OPEN (MISCELLANEOUS) ×1 IMPLANT
CONT SPEC 4OZ CLIKSEAL STRL BL (MISCELLANEOUS) ×7 IMPLANT
COVER PROBE W GEL 5X96 (DRAPES) ×3 IMPLANT
COVER SURGICAL LIGHT HANDLE (MISCELLANEOUS) ×3 IMPLANT
DEVICE DISSECT PLASMABLAD 3.0S (MISCELLANEOUS) ×1 IMPLANT
DRAIN CHANNEL 19F RND (DRAIN) ×5 IMPLANT
DRAPE LAPAROSCOPIC ABDOMINAL (DRAPES) ×3 IMPLANT
DRAPE UTILITY XL STRL (DRAPES) ×6 IMPLANT
DRSG PAD ABDOMINAL 8X10 ST (GAUZE/BANDAGES/DRESSINGS) ×3 IMPLANT
DRSG TEGADERM 4X4.75 (GAUZE/BANDAGES/DRESSINGS) ×2 IMPLANT
ELECT CAUTERY BLADE 6.4 (BLADE) ×3 IMPLANT
ELECT REM PT RETURN 9FT ADLT (ELECTROSURGICAL) ×6
ELECTRODE REM PT RTRN 9FT ADLT (ELECTROSURGICAL) ×1 IMPLANT
EVACUATOR SILICONE 100CC (DRAIN) ×5 IMPLANT
GAUZE SPONGE 4X4 12PLY STRL (GAUZE/BANDAGES/DRESSINGS) ×3 IMPLANT
GAUZE XEROFORM 5X9 LF (GAUZE/BANDAGES/DRESSINGS) ×1 IMPLANT
GLOVE BIO SURGEON STRL SZ7.5 (GLOVE) ×3 IMPLANT
GLOVE BIOGEL PI IND STRL 7.0 (GLOVE) IMPLANT
GLOVE BIOGEL PI INDICATOR 7.0 (GLOVE) ×4
GOWN STRL REUS W/ TWL LRG LVL3 (GOWN DISPOSABLE) ×2 IMPLANT
GOWN STRL REUS W/TWL LRG LVL3 (GOWN DISPOSABLE) ×9
KIT BASIN OR (CUSTOM PROCEDURE TRAY) ×3 IMPLANT
KIT ROOM TURNOVER OR (KITS) ×3 IMPLANT
LIQUID BAND (GAUZE/BANDAGES/DRESSINGS) ×3 IMPLANT
NDL 18GX1X1/2 (RX/OR ONLY) (NEEDLE) IMPLANT
NDL HYPO 25GX1X1/2 BEV (NEEDLE) IMPLANT
NEEDLE 18GX1X1/2 (RX/OR ONLY) (NEEDLE) IMPLANT
NEEDLE HYPO 25GX1X1/2 BEV (NEEDLE) IMPLANT
NS IRRIG 1000ML POUR BTL (IV SOLUTION) ×5 IMPLANT
PACK GENERAL/GYN (CUSTOM PROCEDURE TRAY) ×3 IMPLANT
PAD ARMBOARD 7.5X6 YLW CONV (MISCELLANEOUS) ×5 IMPLANT
PLASMABLADE 3.0S (MISCELLANEOUS) ×3
SPECIMEN JAR LARGE (MISCELLANEOUS) ×2 IMPLANT
SPECIMEN JAR X LARGE (MISCELLANEOUS) ×1 IMPLANT
SUT ETHILON 3 0 FSL (SUTURE) ×3 IMPLANT
SUT MON AB 4-0 PC3 18 (SUTURE) ×3 IMPLANT
SUT VIC AB 3-0 54X BRD REEL (SUTURE) IMPLANT
SUT VIC AB 3-0 BRD 54 (SUTURE) ×6
SUT VIC AB 3-0 SH 18 (SUTURE) ×3 IMPLANT
SYR CONTROL 10ML LL (SYRINGE) IMPLANT
TOWEL OR 17X24 6PK STRL BLUE (TOWEL DISPOSABLE) ×3 IMPLANT
TOWEL OR 17X26 10 PK STRL BLUE (TOWEL DISPOSABLE) ×1 IMPLANT
TUBE CONNECTING 12'X1/4 (SUCTIONS) ×1
TUBE CONNECTING 12X1/4 (SUCTIONS) ×2 IMPLANT

## 2015-05-30 NOTE — Interval H&P Note (Signed)
History and Physical Interval Note:  05/30/2015 7:06 AM  Savannah Howard  has presented today for surgery, with the diagnosis of LEFT BREAST CANCER  The various methods of treatment have been discussed with the patient and family. After consideration of risks, benefits and other options for treatment, the patient has consented to  Procedure(s): LEFT MASTECTOMY WITH SENTINEL LYMPH NODE BIOPSY (Left) as a surgical intervention .  The patient's history has been reviewed, patient examined, no change in status, stable for surgery.  I have reviewed the patient's chart and labs.  Questions were answered to the patient's satisfaction.     TOTH III,Akeelah Seppala S

## 2015-05-30 NOTE — Progress Notes (Signed)
Report given to sharon rn as caregiver 

## 2015-05-30 NOTE — Op Note (Signed)
05/30/2015  9:47 AM  PATIENT:  Savannah Howard  69 y.o. female  PRE-OPERATIVE DIAGNOSIS:  LEFT BREAST CANCER  POST-OPERATIVE DIAGNOSIS:  LEFT BREAST CANCER  PROCEDURE:  Procedure(s): LEFT MASTECTOMY WITH SENTINEL LYMPH NODE BIOPSY (Left)  SURGEON:  Surgeon(s) and Role:    * Jovita Kussmaul, MD - Primary  PHYSICIAN ASSISTANT:   ASSISTANTS: none   ANESTHESIA:   general  EBL:  Total I/O In: 1000 [I.V.:1000] Out: 50 [Blood:50]  BLOOD ADMINISTERED:none  DRAINS: (2) Jackson-Pratt drain(s) with closed bulb suction in the prepectoral space   LOCAL MEDICATIONS USED:  NONE  SPECIMEN:  Source of Specimen:  left mastectomy and sentinel nodes X 3  DISPOSITION OF SPECIMEN:  PATHOLOGY  COUNTS:  YES  TOURNIQUET:  * No tourniquets in log *  DICTATION: .Dragon Dictation   After informed consent was obtained the patient was brought to the operating room and placed in the supine position on the operating room table. After adequate induction of general anesthesia the patient's left chest, breast, and axillary area were prepped with ChloraPrep, allowed to dry, and draped in usual sterile manner. Earlier in the day the patient underwent injection of 1 mCi of technetium sulfur colloid in the subareolar position on the left. At this point, an elliptical incision was made around the nipple and areola complex in order to minimize the excess skin with a 15 blade knife. The incision was carried through the skin and subcutaneous tissue sharply with the plasma blade. Skin hooks were used to elevate the skin flaps anteriorly towards the ceiling. The breast tissue was then dissected away from the skin and subcutaneous tissue along the natural plane circumferentially until the dissection reached the chest wall. Laterally once the dissection reached the axilla the neoprobe was used to identify a area of increased radioactivity. Blunt hemostat dissection was then used and 3 lymph nodes with increased  radioactivity were identified. These lymph nodes were excised sharply with the plasma blade. Ex vivo counts on these 3 sentinel nodes were approximately 200 each. Touch preps on all these nodes were negative. No other hot or palpable lymph nodes were identified in the left axilla. Next the breast was removed from the pectoralis muscle with the pectoralis fascia. Once this was accomplished the breast was removed from the patient. It was marked with a stitch on the lateral skin. The wound was irrigated with copious amounts of saline. Several small vessels along the chest wall were controlled with clips. 2 small stab incisions were made near the anterior axillary line inferior to the operative area with a 15 blade knife. A tonsil clamp was placed through each of these incisions and used to bring 19 [place] round Blake drains into the operative bed. The medial drain was curled along the chest wall and the lateral drain was placed in the axilla. The drains were anchored to the skin with 3-0 nylon stitches. The superior and inferior skin flaps were then grossly reapproximated with interrupted 3-0 Vicryl stitches. The skin was then closed with a running 4-0 Monocryl subcuticular stitch. Dermabond dressings were applied. ABD pads and a breast binder were then applied when the Dermabond was dry. The drains were placed to bulb suction and there was a good seal. The patient tolerated the procedure well. At the end of the case all needle sponge and instrument counts were correct. The patient was then awakened and taken to recovery in stable condition.  PLAN OF CARE: Admit for overnight observation  PATIENT DISPOSITION:  PACU - hemodynamically stable.   Delay start of Pharmacological VTE agent (>24hrs) due to surgical blood loss or risk of bleeding: no

## 2015-05-30 NOTE — Transfer of Care (Signed)
Immediate Anesthesia Transfer of Care Note  Patient: Savannah Howard  Procedure(s) Performed: Procedure(s): LEFT MASTECTOMY WITH SENTINEL LYMPH NODE BIOPSY (Left)  Patient Location: PACU  Anesthesia Type:General  Level of Consciousness: awake, alert  and oriented  Airway & Oxygen Therapy: Patient Spontanous Breathing and Patient connected to nasal cannula oxygen  Post-op Assessment: Report given to RN and Post -op Vital signs reviewed and stable  Post vital signs: Reviewed and stable  Last Vitals:  Filed Vitals:   05/30/15 0620  BP: 187/77  Pulse: 93  Resp: 20    Complications: No apparent anesthesia complications

## 2015-05-30 NOTE — Anesthesia Postprocedure Evaluation (Signed)
  Anesthesia Post-op Note  Patient: Savannah Howard  Procedure(s) Performed: Procedure(s): LEFT MASTECTOMY WITH SENTINEL LYMPH NODE BIOPSY (Left)  Patient Location: PACU  Anesthesia Type:General  Level of Consciousness: awake, alert  and oriented  Airway and Oxygen Therapy: Patient Spontanous Breathing and Patient connected to nasal cannula oxygen  Post-op Pain: mild  Post-op Assessment: Post-op Vital signs reviewed, Patient's Cardiovascular Status Stable, Respiratory Function Stable, Patent Airway and Pain level controlled              Post-op Vital Signs: stable  Last Vitals:  Filed Vitals:   05/30/15 1215  BP: 164/71  Pulse: 83  Temp: 36.6 C  Resp: 15    Complications: No apparent anesthesia complications

## 2015-05-30 NOTE — Anesthesia Procedure Notes (Addendum)
Anesthesia Regional Block:  Pectoralis block  Pre-Anesthetic Checklist: ,, timeout performed, Correct Patient, Correct Site, Correct Laterality, Correct Procedure, Correct Position, site marked, Risks and benefits discussed,  Surgical consent,  Pre-op evaluation,  At surgeon's request and post-op pain management  Laterality: Left  Prep: chloraprep       Needles:  Injection technique: Single-shot  Needle Type: Echogenic Stimulator Needle     Needle Length: 10cm 10 cm Needle Gauge: 21 and 21 G    Additional Needles:  Procedures: ultrasound guided (picture in chart) Pectoralis block Narrative:  Start time: 05/30/2015 7:13 AM End time: 05/30/2015 7:23 AM Injection made incrementally with aspirations every 5 mL.  Performed by: Personally    Procedure Name: LMA Insertion Date/Time: 05/30/2015 7:36 AM Performed by: Susa Loffler Pre-anesthesia Checklist: Patient identified, Timeout performed, Emergency Drugs available, Suction available and Patient being monitored Patient Re-evaluated:Patient Re-evaluated prior to inductionOxygen Delivery Method: Circle system utilized Preoxygenation: Pre-oxygenation with 100% oxygen Intubation Type: IV induction LMA: LMA inserted LMA Size: 4.0 Number of attempts: 1 Placement Confirmation: positive ETCO2 and breath sounds checked- equal and bilateral Tube secured with: Tape Dental Injury: Teeth and Oropharynx as per pre-operative assessment

## 2015-05-30 NOTE — H&P (Signed)
Vesta Mixer. Wrobleski 04/09/2015 11:07 AM Location: [facility] Patient #: [id number] DOB: 07/01/1946 Single / Language: Cleophus Molt / Race: White Female   History of Present Illness Sammuel Hines. Marlou Starks MD; 04/17/2015 8:57 AM) Patient words: breast f/u.  The patient is a 69 year old female who presents for a follow-up for Breast cancer. 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 She has responded well to neoadjuvant chemotherapy. She is receiving her last dose of chemotherapy the week of Labor Day and will be ready for her definitive surgery in the middle of October.   Allergies (Sonya Bynum, CMA; 04/09/2015 11:08 AM) Penicillin G Pot in Dextrose *PENICILLINS* Sulfa 10 *OPHTHALMIC AGENTS*  Medication History (Sonya Bynum, CMA; 04/09/2015 11:08 AM) Atorvastatin Calcium (10MG  Tablet, Oral) Active. Cholecalciferol (10000UNIT Capsule, Oral) Active. Cyanocobalamin (1000MCG/ML Liquid, Oral) Active. Folic Acid (1MG  Tablet, Oral) Active. LORazepam (0.5MG  Tablet, Oral) Active. Losartan Potassium-HCTZ (100-25MG  Tablet, Oral) Active. Metoprolol Succinate ER (50MG  Tablet ER 24HR, Oral) Active. Omeprazole (20MG  Capsule DR, Oral) Active. Potassium Chloride ER (10MEQ Capsule ER, Oral) Active. Terazosin HCl (5MG  Capsule, Oral) Active. Medications Reconciled    Review of Systems Eddie Dibbles S. Marlou Starks MD; 04/17/2015 8:57 AM) General Not Present- Appetite Loss, Chills, Fatigue, Fever, Night Sweats, Weight Gain and Weight Loss. Skin Not Present- Change in Wart/Mole, Dryness, Hives, Jaundice, New Lesions, Non-Healing Wounds, Rash and Ulcer. HEENT Present- Hoarseness and Wears glasses/contact lenses. Not Present- Earache, Hearing Loss, Nose Bleed, Oral Ulcers, Ringing in the Ears, Seasonal Allergies, Sinus Pain, Sore Throat, Visual Disturbances and Yellow Eyes. Respiratory Present-  Snoring. Not Present- Bloody sputum, Chronic Cough, Difficulty Breathing and Wheezing. Breast Present- Breast Mass and Breast Pain. Not Present- Nipple Discharge and Skin Changes. Cardiovascular Present- Leg Cramps. Not Present- Chest Pain, Difficulty Breathing Lying Down, Palpitations, Rapid Heart Rate, Shortness of Breath and Swelling of Extremities. Gastrointestinal Present- Indigestion. Not Present- Abdominal Pain, Bloating, Bloody Stool, Change in Bowel Habits, Chronic diarrhea, Constipation, Difficulty Swallowing, Excessive gas, Gets full quickly at meals, Hemorrhoids, Nausea, Rectal Pain and Vomiting. Female Genitourinary Not Present- Frequency, Nocturia, Painful Urination, Pelvic Pain and Urgency. Musculoskeletal Not Present- Back Pain, Joint Pain, Joint Stiffness, Muscle Pain, Muscle Weakness and Swelling of Extremities. Neurological Present- Tremor. Not Present- Decreased Memory, Fainting, Headaches, Numbness, Seizures, Tingling, Trouble walking and Weakness. Psychiatric Not Present- Anxiety, Bipolar, Change in Sleep Pattern, Depression, Fearful and Frequent crying. Endocrine Not Present- Cold Intolerance, Excessive Hunger, Hair Changes, Heat Intolerance, Hot flashes and New Diabetes. Hematology Not Present- Easy Bruising, Excessive bleeding, Gland problems, HIV and Persistent Infections.  Vitals (Sonya Bynum CMA; 04/09/2015 11:07 AM) 04/09/2015 11:07 AM Weight: 214 lb Height: 62in Body Surface Area: 2.06 m Body Mass Index: 39.14 kg/m  Temp.: 29F(Temporal)  Pulse: 77 (Regular)  BP: 130/72 (Sitting, Left Arm, Standard)     Physical Exam Eddie Dibbles S. Marlou Starks MD; 04/17/2015 8:58 AM) General Mental Status-Alert. General Appearance-Consistent with stated age. Hydration-Well hydrated. Voice-Normal.  Head and Neck Head-normocephalic, atraumatic with no lesions or palpable masses. Trachea-midline. Thyroid Gland Characteristics - normal size and  consistency.  Eye Eyeball - Bilateral-Extraocular movements intact. Sclera/Conjunctiva - Bilateral-No scleral icterus.  Chest and Lung Exam Chest and lung exam reveals -quiet, even and easy respiratory effort with no use of accessory muscles and on auscultation, normal breath sounds, no adventitious sounds and normal vocal resonance. Inspection Chest Wall - Normal. Back - normal.  Breast Note:  There is no discernible palpable mass in the upper outer left breast. There is no palpable axillary, supraclavicular, or cervical lymphadenopathy. Her skin looks normal again.   Cardiovascular Cardiovascular examination reveals -normal heart sounds, regular rate and rhythm with no murmurs and normal pedal pulses bilaterally.  Abdomen Inspection Inspection of the abdomen reveals - No Hernias. Skin - Scar - no surgical scars. Palpation/Percussion Palpation and Percussion of the abdomen reveal - Soft, Non Tender, No Rebound tenderness, No Rigidity (guarding) and No hepatosplenomegaly. Auscultation Auscultation of the abdomen reveals - Bowel sounds normal.  Neurologic Neurologic evaluation reveals -alert and oriented x 3 with no impairment of recent or remote memory. Mental Status-Normal.  Musculoskeletal Normal Exam - Left-Upper Extremity Strength Normal and Lower Extremity Strength Normal. Normal Exam - Right-Upper Extremity Strength Normal and Lower Extremity Strength Normal.  Lymphatic Head & Neck  General Head & Neck Lymphatics: Bilateral - Description - Normal. Axillary  General Axillary Region: Bilateral - Description - Normal. Tenderness - Non Tender. Femoral & Inguinal  Generalized Femoral & Inguinal Lymphatics: Bilateral - Description - Normal. Tenderness - Non Tender.    Assessment & Plan Eddie Dibbles S. Marlou Starks MD; 04/09/2015 11:22 AM) PRIMARY CANCER OF UPPER OUTER QUADRANT OF LEFT FEMALE BREAST (174.4  C50.412) Impression: The patient had a locally advanced  cancer of the left breast in the upper outer quadrant that was involving the nipple and areolar complex. She has responded very well to neoadjuvant chemotherapy. I have talked to her in detail about the different options for treatment and at this point we both favor mastectomy and sentinel node mapping. I have discussed with her in detail the risks and benefits of the operation to do this as well as some of the technical aspects and she understands and wishes to proceed. She will get her last dose of chemotherapy the week after Labor Day so she will be ready for surgery in the second week of October. Her end of treatment MRI will take place in about 2 weeks.    Signed by Luella Cook, MD (04/17/2015 8:58 AM)

## 2015-05-30 NOTE — Progress Notes (Signed)
Pt admitted to 6N10 via bed from PACU.  Pt AAO X 4.  Pt on 2LO2 via [place].  Pt has surgical dsg to lt breast with dry dressing, JP drains X 2 and breast binder.  Pt has 20G to rt wrist with fluids infusing.  SCDs in place.  Family to bedside.  Reprot rcvd from Verdis Frederickson, [place].  Pt has no questions at the moment.  Will continue to monitor.

## 2015-05-31 ENCOUNTER — Encounter (HOSPITAL_COMMUNITY): Payer: Self-pay | Admitting: General Surgery

## 2015-05-31 DIAGNOSIS — I10 Essential (primary) hypertension: Secondary | ICD-10-CM | POA: Diagnosis not present

## 2015-05-31 DIAGNOSIS — C50412 Malignant neoplasm of upper-outer quadrant of left female breast: Secondary | ICD-10-CM | POA: Diagnosis not present

## 2015-05-31 MED ORDER — OXYCODONE-ACETAMINOPHEN 5-325 MG PO TABS: 1.0000 | ORAL_TABLET | ORAL | Status: DC | PRN

## 2015-05-31 NOTE — Discharge Summary (Signed)
Physician Discharge Summary  Patient ID: Savannah Howard MRN: [id number] DOB/AGE: [birth year] 69 y.o.  Admit date: 05/30/2015 Discharge date: 05/31/2015  Admission Diagnoses:  Discharge Diagnoses:  Active Problems:   Breast cancer of upper-outer quadrant of left female breast [facility])   Discharged Condition: good  Hospital Course: the pt underwent left mastectomy and sentinel node mapping. She tolerated surgery well. On pod 1 she was ready for discharge home  Consults: None  Significant Diagnostic Studies: none  Treatments: surgery: as above  Discharge Exam: Blood pressure 145/85, pulse 73, temperature 97.9 F (36.6 C), temperature source Oral, resp. rate 17, weight 92.987 kg (205 lb), SpO2 100 %. Resp: clear to auscultation bilaterally Chest wall: skin flaps look good Cardio: regular rate and rhythm GI: soft, non-tender; bowel sounds normal; no masses,  no organomegaly  Disposition: 01-Home or Self Care  Discharge Instructions    Call MD for:  difficulty breathing, headache or visual disturbances    Complete by:  As directed      Call MD for:  extreme fatigue    Complete by:  As directed      Call MD for:  hives    Complete by:  As directed      Call MD for:  persistant dizziness or light-headedness    Complete by:  As directed      Call MD for:  persistant nausea and vomiting    Complete by:  As directed      Call MD for:  redness, tenderness, or signs of infection (pain, swelling, redness, odor or green/yellow discharge around incision site)    Complete by:  As directed      Call MD for:  severe uncontrolled pain    Complete by:  As directed      Call MD for:  temperature >100.4    Complete by:  As directed      Diet - low sodium heart healthy    Complete by:  As directed      Discharge instructions    Complete by:  As directed   Sponge bathe while drains are in. No overhead activity. Empty drains, record output, and recharge bulb twice a day     Increase  activity slowly    Complete by:  As directed      No wound care    Complete by:  As directed             Medication List    TAKE these medications        acetaminophen 500 MG tablet  Commonly known as:  TYLENOL  Take 1,000 mg by mouth every 6 (six) hours as needed for mild pain.     dexamethasone 4 MG tablet  Commonly known as:  DECADRON  Take 1 tablet (4 mg total) by mouth 2 (two) times daily. Start the day before Taxotere. Then again the day after chemo for 3 days.     diphenoxylate-atropine 2.5-0.025 MG tablet  Commonly known as:  LOMOTIL  Take 1 tablet by mouth 4 (four) times daily as needed for diarrhea or loose stools.     lidocaine-prilocaine cream  Commonly known as:  EMLA  Apply to affected area once     LORazepam 0.5 MG tablet  Commonly known as:  ATIVAN  Take 1 tablet (0.5 mg total) by mouth 3 (three) times daily as needed. for anxiety     omeprazole 20 MG capsule  Commonly known as:  PRILOSEC  Take 1 capsule (20  mg total) by mouth daily.     ondansetron 8 MG tablet  Commonly known as:  ZOFRAN  Take 1 tablet (8 mg total) by mouth 2 (two) times daily. Start the day after chemo for 3 days. Then take as needed for nausea or vomiting.     oxyCODONE-acetaminophen 5-325 MG tablet  Commonly known as:  ROXICET  Take 1-2 tablets by mouth every 4 (four) hours as needed.     oxyCODONE-acetaminophen 5-325 MG tablet  Commonly known as:  ROXICET  Take 1-2 tablets by mouth every 4 (four) hours as needed.     potassium chloride 10 MEQ CR capsule  Commonly known as:  MICRO-K  Take 1 capsule (10 mEq total) by mouth daily.     terazosin 5 MG capsule  Commonly known as:  HYTRIN  Take 1 capsule (5 mg total) by mouth at bedtime.     vitamin B-12 1000 MCG tablet  Commonly known as:  CYANOCOBALAMIN  Take 1,000 mcg by mouth daily.           Follow-up Information    Follow up with Merrie Roof, MD In 1 week.   Specialty:  General Surgery   Contact information:    [address] [place] [address] [phone]       Signed: Merrie Roof 05/31/2015, 9:47 AM

## 2015-05-31 NOTE — Progress Notes (Signed)
1 Day Post-Op  Subjective: No complaints.  Objective: Vital signs in last 24 hours: Temp:  [97 F (36.1 C)-98.9 F (37.2 C)] 97.9 F (36.6 C) (10/21 0519) Pulse Rate:  [73-83] 73 (10/21 0519) Resp:  [14-19] 17 (10/21 0519) BP: (132-187)/(63-85) 145/85 mmHg (10/21 0519) SpO2:  [95 %-100 %] 100 % (10/21 0519) Last BM Date: 05/29/15  Intake/Output from previous day: 10/20 0701 - 10/21 0700 In: 2842 [I.V.:2842] Out: 5974 [Urine:1600; Drains:144; Blood:50] Intake/Output this shift:    Resp: clear to auscultation bilaterally Chest wall: skin flaps look good Cardio: regular rate and rhythm GI: soft, non-tender; bowel sounds normal; no masses,  no organomegaly  Lab Results:  No results for input(s): WBC, HGB, HCT, PLT in the last 72 hours. BMET No results for input(s): NA, K, CL, CO2, GLUCOSE, BUN, CREATININE, CALCIUM in the last 72 hours. PT/INR No results for input(s): LABPROT, INR in the last 72 hours. ABG No results for input(s): PHART, HCO3 in the last 72 hours.  Invalid input(s): PCO2, PO2  Studies/Results: Nm Sentinel Node Inj-no Rpt (breast)  05/30/2015  CLINICAL DATA: left breast cancer Sulfur colloid was injected intradermally by the nuclear medicine technologist for breast cancer sentinel node localization.    Anti-infectives: Anti-infectives    Start     Dose/Rate Route Frequency Ordered Stop   05/30/15 0602  vancomycin (VANCOCIN) IVPB 1000 mg/200 mL premix     1,000 mg 200 mL/hr over 60 Minutes Intravenous On call to O.R. 05/30/15 0602 05/30/15 0830   05/30/15 0602  vancomycin (VANCOCIN) 1 GM/200ML IVPB    Comments:  Ancil Boozer  : cabinet override      05/30/15 0602 05/30/15 1814      Assessment/Plan: s/p Procedure(s): LEFT MASTECTOMY WITH SENTINEL LYMPH NODE BIOPSY (Left) Advance diet Discharge     TOTH III,PAUL S 05/31/2015

## 2015-05-31 NOTE — Progress Notes (Signed)
Pt dischargedd to home.  Discharge instructions explained to pt.  Pt has no questions at the moment.  IV removed.  Pt ambulated off floor on her own.

## 2015-06-02 NOTE — Assessment & Plan Note (Signed)
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 Continue Herceptin maintenance 2. Patient will need adjuvant radiation followed by 3. Antiestrogen therapy with anastrozole 5 years  RTC in 6 weeks

## 2015-06-03 ENCOUNTER — Ambulatory Visit (HOSPITAL_BASED_OUTPATIENT_CLINIC_OR_DEPARTMENT_OTHER): Payer: Managed Care, Other (non HMO)

## 2015-06-03 ENCOUNTER — Ambulatory Visit (HOSPITAL_BASED_OUTPATIENT_CLINIC_OR_DEPARTMENT_OTHER): Payer: Managed Care, Other (non HMO) | Admitting: Hematology and Oncology

## 2015-06-03 ENCOUNTER — Encounter: Payer: Self-pay | Admitting: *Deleted

## 2015-06-03 ENCOUNTER — Other Ambulatory Visit: Payer: Managed Care, Other (non HMO)

## 2015-06-03 ENCOUNTER — Encounter: Payer: Self-pay | Admitting: Hematology and Oncology

## 2015-06-03 ENCOUNTER — Telehealth: Payer: Self-pay | Admitting: Hematology and Oncology

## 2015-06-03 VITALS — BP 187/74 | HR 74 | Temp 98.3°F | Resp 19 | Ht 63.0 in | Wt 209.1 lb

## 2015-06-03 DIAGNOSIS — C50412 Malignant neoplasm of upper-outer quadrant of left female breast: Secondary | ICD-10-CM

## 2015-06-03 DIAGNOSIS — Z5112 Encounter for antineoplastic immunotherapy: Secondary | ICD-10-CM

## 2015-06-03 LAB — CBC WITH DIFFERENTIAL/PLATELET
BASO%: 0.2 % (ref 0.0–2.0)
Basophils Absolute: 0 10*3/uL (ref 0.0–0.1)
EOS%: 1.6 % (ref 0.0–7.0)
Eosinophils Absolute: 0.1 10*3/uL (ref 0.0–0.5)
HCT: 30.4 % — ABNORMAL LOW (ref 34.8–46.6)
HGB: 10.3 g/dL — ABNORMAL LOW (ref 11.6–15.9)
LYMPH%: 46.2 % (ref 14.0–49.7)
MCH: 35.8 pg — ABNORMAL HIGH (ref 25.1–34.0)
MCHC: 34.1 g/dL (ref 31.5–36.0)
MCV: 105.2 fL — AB (ref 79.5–101.0)
MONO#: 0.5 10*3/uL (ref 0.1–0.9)
MONO%: 7.1 % (ref 0.0–14.0)
NEUT%: 44.9 % (ref 38.4–76.8)
NEUTROS ABS: 3.2 10*3/uL (ref 1.5–6.5)
Platelets: 223 10*3/uL (ref 145–400)
RBC: 2.89 10*6/uL — AB (ref 3.70–5.45)
RDW: 19.4 % — ABNORMAL HIGH (ref 11.2–14.5)
WBC: 7.1 10*3/uL (ref 3.9–10.3)
lymph#: 3.3 10*3/uL (ref 0.9–3.3)

## 2015-06-03 LAB — COMPREHENSIVE METABOLIC PANEL (CC13)
ALT: 9 U/L (ref 0–55)
AST: 10 U/L (ref 5–34)
Albumin: 3.1 g/dL — ABNORMAL LOW (ref 3.5–5.0)
Alkaline Phosphatase: 64 U/L (ref 40–150)
Anion Gap: 11 mEq/L (ref 3–11)
BUN: 18 mg/dL (ref 7.0–26.0)
CHLORIDE: 109 meq/L (ref 98–109)
CO2: 21 meq/L — AB (ref 22–29)
Calcium: 9.8 mg/dL (ref 8.4–10.4)
Creatinine: 0.9 mg/dL (ref 0.6–1.1)
EGFR: 62 mL/min/{1.73_m2} — AB (ref >90)
GLUCOSE: 118 mg/dL (ref 70–140)
Potassium: 4 mEq/L (ref 3.5–5.1)
SODIUM: 142 meq/L (ref 136–145)
TOTAL PROTEIN: 6.3 g/dL — AB (ref 6.4–8.3)
Total Bilirubin: <0.3 mg/dL (ref 0.20–1.20)

## 2015-06-03 MED ORDER — DIPHENHYDRAMINE HCL 25 MG PO CAPS
ORAL_CAPSULE | ORAL | Status: AC
  Filled 2015-06-03: qty 2

## 2015-06-03 MED ORDER — SODIUM CHLORIDE 0.9 % IJ SOLN
10.0000 mL | INTRAMUSCULAR | Status: DC | PRN
  Filled 2015-06-03: qty 10

## 2015-06-03 MED ORDER — DIPHENHYDRAMINE HCL 25 MG PO CAPS
50.0000 mg | ORAL_CAPSULE | Freq: Once | ORAL | Status: AC
  Administered 2015-06-03: 50 mg via ORAL

## 2015-06-03 MED ORDER — ACETAMINOPHEN 325 MG PO TABS
650.0000 mg | ORAL_TABLET | Freq: Once | ORAL | Status: AC
  Administered 2015-06-03: 650 mg via ORAL

## 2015-06-03 MED ORDER — TRASTUZUMAB CHEMO INJECTION 440 MG
6.0000 mg/kg | Freq: Once | INTRAVENOUS | Status: AC
  Administered 2015-06-03: 567 mg via INTRAVENOUS
  Filled 2015-06-03: qty 27

## 2015-06-03 MED ORDER — ACETAMINOPHEN 325 MG PO TABS
ORAL_TABLET | ORAL | Status: AC
  Filled 2015-06-03: qty 2

## 2015-06-03 MED ORDER — SODIUM CHLORIDE 0.9 % IV SOLN
Freq: Once | INTRAVENOUS | Status: AC
  Administered 2015-06-03: 10:00:00 via INTRAVENOUS

## 2015-06-03 MED ORDER — HEPARIN SOD (PORK) LOCK FLUSH 100 UNIT/ML IV SOLN
500.0000 [IU] | Freq: Once | INTRAVENOUS | Status: AC | PRN
  Administered 2015-06-03: 500 [IU]
  Filled 2015-06-03: qty 5

## 2015-06-03 NOTE — Telephone Encounter (Signed)
Appointments made and avs printed for patient °

## 2015-06-03 NOTE — Patient Instructions (Signed)
[facility] Discharge Instructions for Patients Receiving Chemotherapy  Today you received the following chemotherapy agents Herceptin.  To help prevent nausea and vomiting after your treatment, we encourage you to take your nausea medication as prescribed by your physician. If you develop nausea and vomiting that is not controlled by your nausea medication, call the clinic.   BELOW ARE SYMPTOMS THAT SHOULD BE REPORTED IMMEDIATELY:  *FEVER GREATER THAN 100.5 F  *CHILLS WITH OR WITHOUT FEVER  NAUSEA AND VOMITING THAT IS NOT CONTROLLED WITH YOUR NAUSEA MEDICATION  *UNUSUAL SHORTNESS OF BREATH  *UNUSUAL BRUISING OR BLEEDING  TENDERNESS IN MOUTH AND THROAT WITH OR WITHOUT PRESENCE OF ULCERS  *URINARY PROBLEMS  *BOWEL PROBLEMS  UNUSUAL RASH Items with * indicate a potential emergency and should be followed up as soon as possible.  Feel free to call the clinic you have any questions or concerns. The clinic phone number is [phone].  Please show the CHEMO ALERT CARD at check-in to the Emergency Department and triage nurse.   

## 2015-06-03 NOTE — Progress Notes (Signed)
Patient Care Team: [name], MD as PCP - General  DIAGNOSIS: No matching staging information was found for the patient.  SUMMARY OF ONCOLOGIC HISTORY:   Breast cancer of upper-outer quadrant of left female breast ([place])   12/07/2014 Initial Diagnosis Left breast 2:00 position: Invasive ductal carcinoma grade 2, ER 5%, PR 0%, Ki-67 80%, HER-2 positive ratio 6.96   12/07/2014 Mammogram Large left breast mass 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, 2 small benign nodules in the right breast stable since 2002, other consistent with normal intramammary lymph node left axilla ultrasound normal-sized lymph nodes   12/31/2014 - 04/22/2015 Neo-Adjuvant Chemotherapy TCH Perjeta neoadjuvant chemotherapy 6 (decreased chemotherapy dosage with cycle 2 and discontinued Perjeta for diarrhea)    04/29/2015 Breast MRI Significant interval decrease in size of the mass, continued skin retraction, 4.3 x 2 x 1.8 cm (previously 4.9 x 3.9 x 3.9 cm)   05/30/2015 Surgery Path CR, 0/6 LN Negative    CHIEF COMPLIANT: Follow-up after surgery to discuss pathology report  INTERVAL HISTORY: Savannah Howard is a 69 year old with above-mentioned history of left breast cancer treated with neoadjuvant chemotherapy followed by mastectomy. Final pathology revealed complete response to treatment. Total of 6 lymph nodes were negative. She is still mildly sore has drains in place. She has not been doing much activity. She has been resting and recovering. Neuropathy in her feet is much improved. She is not using much of any pain medications.  REVIEW OF SYSTEMS:   Constitutional: Denies fevers, chills or abnormal weight loss, complains of generalized weakness Eyes: Denies blurriness of vision Ears, nose, mouth, throat, and face: Denies mucositis or sore throat Respiratory: Denies cough, dyspnea or wheezes Cardiovascular: Denies palpitation, chest discomfort or lower extremity swelling Gastrointestinal:  Denies nausea, heartburn or change in bowel  habits Skin: Denies abnormal skin rashes Lymphatics: Denies new lymphadenopathy or easy bruising Neurological:Denies numbness, tingling or new weaknesses Behavioral/Psych: Mood is stable, no new changes  Breast: Drains still in place. Moderate soreness All other systems were reviewed with the patient and are negative.  I have reviewed the past medical history, past surgical history, social history and family history with the patient and they are unchanged from previous note.  ALLERGIES:  is allergic to penicillins and sulfonamide derivatives.  MEDICATIONS:  Current Outpatient Prescriptions  Medication Sig Dispense Refill  . acetaminophen (TYLENOL) 500 MG tablet Take 1,000 mg by mouth every 6 (six) hours as needed for mild pain.    . diphenoxylate-atropine (LOMOTIL) 2.5-0.025 MG per tablet Take 1 tablet by mouth 4 (four) times daily as needed for diarrhea or loose stools. 60 tablet 1  . lidocaine-prilocaine (EMLA) cream Apply to affected area once 30 g 3  . LORazepam (ATIVAN) 0.5 MG tablet Take 1 tablet (0.5 mg total) by mouth 3 (three) times daily as needed. for anxiety 270 tablet 1  . omeprazole (PRILOSEC) 20 MG capsule Take 1 capsule (20 mg total) by mouth daily. 90 capsule 1  . ondansetron (ZOFRAN) 8 MG tablet Take 1 tablet (8 mg total) by mouth 2 (two) times daily. Start the day after chemo for 3 days. Then take as needed for nausea or vomiting. 30 tablet 1  . oxyCODONE-acetaminophen (ROXICET) 5-325 MG tablet Take 1-2 tablets by mouth every 4 (four) hours as needed. 50 tablet 0  . potassium chloride (MICRO-K) 10 MEQ CR capsule Take 1 capsule (10 mEq total) by mouth daily. 90 capsule 3  . terazosin (HYTRIN) 5 MG capsule Take 1 capsule (5  mg total) by mouth at bedtime. 90 capsule 3  . vitamin B-12 (CYANOCOBALAMIN) 1000 MCG tablet Take 1,000 mcg by mouth daily.     No current facility-administered medications for this visit.   Facility-Administered Medications Ordered in Other Visits   Medication Dose Route Frequency Provider Last Rate Last Dose  . technetium sulfur colloid (NYCOMED-) filtered injection solution 1 milli Curie  1 milli Curie Intradermal Once PRN Medication Radiologist, MD        PHYSICAL EXAMINATION: ECOG PERFORMANCE STATUS: 1 - Symptomatic but completely ambulatory  Filed Vitals:   06/03/15 0810  BP: 187/74  Pulse: 74  Temp: 98.3 F (36.8 C)  Resp: 19   Filed Weights   06/03/15 0810  Weight: 209 lb 1.6 oz (94.847 kg)    GENERAL:alert, no distress and comfortable SKIN: skin color, texture, turgor are normal, no rashes or significant lesions EYES: normal, Conjunctiva are pink and non-injected, sclera clear OROPHARYNX:no exudate, no erythema and lips, buccal mucosa, and tongue normal  NECK: supple, thyroid normal size, non-tender, without nodularity LYMPH:  no palpable lymphadenopathy in the cervical, axillary or inguinal LUNGS: clear to auscultation and percussion with normal breathing effort HEART: regular rate & rhythm and no murmurs and no lower extremity edema ABDOMEN:abdomen soft, non-tender and normal bowel sounds Musculoskeletal:no cyanosis of digits and no clubbing  NEURO: alert & oriented x 3 with fluent speech, grade 1 peripheral neuropathy   LABORATORY DATA:  I have reviewed the data as listed   Chemistry      Component Value Date/Time   NA 139 05/22/2015 0825   NA 139 05/01/2015 0818   K 3.9 05/22/2015 0825   K 3.1* 05/01/2015 0818   CL 101 05/22/2015 0825   CO2 19* 05/22/2015 0825   CO2 23 05/01/2015 0818   BUN 16 05/22/2015 0825   BUN 11.6 05/01/2015 0818   CREATININE 1.32* 05/22/2015 0825   CREATININE 1.0 05/01/2015 0818      Component Value Date/Time   CALCIUM 9.6 05/22/2015 0825   CALCIUM 9.1 05/01/2015 0818   ALKPHOS 73 05/01/2015 0818   ALKPHOS 68 11/05/2014 1456   AST 12 05/01/2015 0818   AST 15 11/05/2014 1456   ALT 16 05/01/2015 0818   ALT 14 11/05/2014 1456   BILITOT 0.56 05/01/2015 0818   BILITOT  0.4 11/05/2014 1456       Lab Results  Component Value Date   WBC 6.3 05/22/2015   HGB 11.1* 05/22/2015   HCT 33.3* 05/22/2015   MCV 104.4* 05/22/2015   PLT 127* 05/22/2015   NEUTROABS 1.7 05/13/2015    ASSESSMENT & PLAN:  Breast cancer of upper-outer quadrant of left female breast ([place]) 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 Continue Herceptin maintenance 2. We will discuss whether the patient needs adjuvant radiation therapy in the tumor board 3. Antiestrogen therapy with anastrozole 5 years for ER positivity of 5% (we will start in 6 weeks if the patient does not get radiation therapy)  Next echocardiogram to be done and of November 2016  RTC in 6 weeks   Orders Placed This Encounter  Procedures  . CBC with Differential    Standing Status: Standing     Number of Occurrences: 12     Standing Expiration Date: 06/02/2016  . Comprehensive metabolic panel (Cmet) - [facility]    Standing Status: Standing     Number of Occurrences: 12     Standing Expiration Date: 06/02/2016   The patient has a good understanding of the overall plan. she agrees with it. she will call with any problems that may develop before the next visit here.   Rulon Eisenmenger, MD 06/03/2015

## 2015-06-04 ENCOUNTER — Telehealth: Payer: Self-pay | Admitting: Hematology and Oncology

## 2015-06-04 ENCOUNTER — Telehealth (HOSPITAL_COMMUNITY): Payer: Self-pay | Admitting: Vascular Surgery

## 2015-06-04 NOTE — Telephone Encounter (Signed)
Left message to make appt w/ ECHO

## 2015-06-04 NOTE — Telephone Encounter (Signed)
Per Arbutus Leas this patient is a new patient to be scheduled in November and she will call the patient

## 2015-06-06 ENCOUNTER — Other Ambulatory Visit: Payer: Self-pay | Admitting: Internal Medicine

## 2015-06-07 NOTE — Telephone Encounter (Signed)
Called [facility] had to leave refill on pharmacy vm left md approval.../lmb

## 2015-06-12 ENCOUNTER — Telehealth: Payer: Self-pay | Admitting: *Deleted

## 2015-06-12 NOTE — Telephone Encounter (Signed)
Left vm for pt to return call to discuss lack of need for xrt and start on Anastrozole.

## 2015-06-13 ENCOUNTER — Telehealth: Payer: Self-pay | Admitting: *Deleted

## 2015-06-13 NOTE — Telephone Encounter (Signed)
Left vm request return call to discuss anastrozole and pharmacy to call into. Contact information provided.

## 2015-06-24 ENCOUNTER — Telehealth: Payer: Self-pay

## 2015-06-24 ENCOUNTER — Other Ambulatory Visit (HOSPITAL_BASED_OUTPATIENT_CLINIC_OR_DEPARTMENT_OTHER): Payer: Medicare Other

## 2015-06-24 ENCOUNTER — Ambulatory Visit (HOSPITAL_BASED_OUTPATIENT_CLINIC_OR_DEPARTMENT_OTHER): Payer: Medicare Other

## 2015-06-24 VITALS — BP 165/58 | HR 70 | Temp 98.6°F | Resp 20

## 2015-06-24 DIAGNOSIS — I10 Essential (primary) hypertension: Secondary | ICD-10-CM

## 2015-06-24 DIAGNOSIS — F411 Generalized anxiety disorder: Secondary | ICD-10-CM

## 2015-06-24 DIAGNOSIS — R110 Nausea: Secondary | ICD-10-CM

## 2015-06-24 DIAGNOSIS — C50412 Malignant neoplasm of upper-outer quadrant of left female breast: Secondary | ICD-10-CM

## 2015-06-24 DIAGNOSIS — E785 Hyperlipidemia, unspecified: Secondary | ICD-10-CM

## 2015-06-24 DIAGNOSIS — K521 Toxic gastroenteritis and colitis: Secondary | ICD-10-CM

## 2015-06-24 DIAGNOSIS — Z5112 Encounter for antineoplastic immunotherapy: Secondary | ICD-10-CM

## 2015-06-24 DIAGNOSIS — T451X5A Adverse effect of antineoplastic and immunosuppressive drugs, initial encounter: Secondary | ICD-10-CM

## 2015-06-24 LAB — COMPREHENSIVE METABOLIC PANEL (CC13)
ALBUMIN: 3.5 g/dL (ref 3.5–5.0)
ALK PHOS: 66 U/L (ref 40–150)
ALT: 12 U/L (ref 0–55)
ANION GAP: 16 meq/L — AB (ref 3–11)
AST: 12 U/L (ref 5–34)
BILIRUBIN TOTAL: 0.3 mg/dL (ref 0.20–1.20)
BUN: 19.8 mg/dL (ref 7.0–26.0)
CO2: 22 mEq/L (ref 22–29)
Calcium: 9.8 mg/dL (ref 8.4–10.4)
Chloride: 105 mEq/L (ref 98–109)
Creatinine: 1.2 mg/dL — ABNORMAL HIGH (ref 0.6–1.1)
EGFR: 46 mL/min/{1.73_m2} — AB (ref >90)
Glucose: 137 mg/dl (ref 70–140)
Potassium: 3.3 mEq/L — ABNORMAL LOW (ref 3.5–5.1)
Sodium: 143 mEq/L (ref 136–145)
TOTAL PROTEIN: 7 g/dL (ref 6.4–8.3)

## 2015-06-24 LAB — CBC WITH DIFFERENTIAL/PLATELET
BASO%: 0.1 % (ref 0.0–2.0)
BASOS ABS: 0 10*3/uL (ref 0.0–0.1)
EOS ABS: 0.2 10*3/uL (ref 0.0–0.5)
EOS%: 3 % (ref 0.0–7.0)
HCT: 35.2 % (ref 34.8–46.6)
HEMOGLOBIN: 11.9 g/dL (ref 11.6–15.9)
LYMPH#: 3.2 10*3/uL (ref 0.9–3.3)
LYMPH%: 40.7 % (ref 14.0–49.7)
MCH: 35.6 pg — AB (ref 25.1–34.0)
MCHC: 33.8 g/dL (ref 31.5–36.0)
MCV: 105.4 fL — AB (ref 79.5–101.0)
MONO#: 0.8 10*3/uL (ref 0.1–0.9)
MONO%: 9.7 % (ref 0.0–14.0)
NEUT%: 46.5 % (ref 38.4–76.8)
NEUTROS ABS: 3.7 10*3/uL (ref 1.5–6.5)
PLATELETS: 198 10*3/uL (ref 145–400)
RBC: 3.34 10*6/uL — AB (ref 3.70–5.45)
RDW: 15 % — ABNORMAL HIGH (ref 11.2–14.5)
WBC: 7.9 10*3/uL (ref 3.9–10.3)

## 2015-06-24 MED ORDER — SODIUM CHLORIDE 0.9 % IV SOLN
Freq: Once | INTRAVENOUS | Status: AC
  Administered 2015-06-24: 09:00:00 via INTRAVENOUS

## 2015-06-24 MED ORDER — HEPARIN SOD (PORK) LOCK FLUSH 100 UNIT/ML IV SOLN
500.0000 [IU] | Freq: Once | INTRAVENOUS | Status: AC | PRN
  Administered 2015-06-24: 500 [IU]
  Filled 2015-06-24: qty 5

## 2015-06-24 MED ORDER — SODIUM CHLORIDE 0.9 % IJ SOLN
10.0000 mL | INTRAMUSCULAR | Status: DC | PRN
  Administered 2015-06-24: 10 mL
  Filled 2015-06-24: qty 10

## 2015-06-24 MED ORDER — TRASTUZUMAB CHEMO INJECTION 440 MG
6.0000 mg/kg | Freq: Once | INTRAVENOUS | Status: AC
  Administered 2015-06-24: 567 mg via INTRAVENOUS
  Filled 2015-06-24: qty 27

## 2015-06-24 MED ORDER — ACETAMINOPHEN 325 MG PO TABS
650.0000 mg | ORAL_TABLET | Freq: Once | ORAL | Status: AC
  Administered 2015-06-24: 650 mg via ORAL

## 2015-06-24 MED ORDER — DIPHENHYDRAMINE HCL 25 MG PO CAPS
ORAL_CAPSULE | ORAL | Status: AC
  Filled 2015-06-24: qty 2

## 2015-06-24 MED ORDER — ACETAMINOPHEN 325 MG PO TABS
ORAL_TABLET | ORAL | Status: AC
Start: 2015-06-24 — End: 2015-06-24
  Filled 2015-06-24: qty 2

## 2015-06-24 MED ORDER — DIPHENHYDRAMINE HCL 25 MG PO CAPS
50.0000 mg | ORAL_CAPSULE | Freq: Once | ORAL | Status: AC
  Administered 2015-06-24: 50 mg via ORAL

## 2015-06-24 NOTE — Telephone Encounter (Signed)
Pt returned call - reports she was taking OTC potassium because she had run out, but that she picked up her refill today and will restart her prescription potassium.  Advised pt her potassium today was 3.3.  Pt voiced understanding.

## 2015-06-24 NOTE — Progress Notes (Signed)
Message left for Savannah Howard @ Dr. Geralyn Flash desk relaying that pt's Potassium was 3.3 today down from 4 three weeks ago.

## 2015-06-24 NOTE — Patient Instructions (Signed)
[facility] Discharge Instructions for Patients Receiving Chemotherapy  Today you received the following chemotherapy agents Herceptin.  To help prevent nausea and vomiting after your treatment, we encourage you to take your nausea medication as prescribed by your physician. If you develop nausea and vomiting that is not controlled by your nausea medication, call the clinic.   BELOW ARE SYMPTOMS THAT SHOULD BE REPORTED IMMEDIATELY:  *FEVER GREATER THAN 100.5 F  *CHILLS WITH OR WITHOUT FEVER  NAUSEA AND VOMITING THAT IS NOT CONTROLLED WITH YOUR NAUSEA MEDICATION  *UNUSUAL SHORTNESS OF BREATH  *UNUSUAL BRUISING OR BLEEDING  TENDERNESS IN MOUTH AND THROAT WITH OR WITHOUT PRESENCE OF ULCERS  *URINARY PROBLEMS  *BOWEL PROBLEMS  UNUSUAL RASH Items with * indicate a potential emergency and should be followed up as soon as possible.  Feel free to call the clinic you have any questions or concerns. The clinic phone number is [phone].  Please show the CHEMO ALERT CARD at check-in to the Emergency Department and triage nurse.   

## 2015-06-24 NOTE — Telephone Encounter (Signed)
LMOVM - pt to return call to clinic to discuss potassium levels.

## 2015-07-15 ENCOUNTER — Telehealth: Payer: Self-pay | Admitting: Hematology and Oncology

## 2015-07-15 ENCOUNTER — Ambulatory Visit (HOSPITAL_BASED_OUTPATIENT_CLINIC_OR_DEPARTMENT_OTHER): Payer: Medicare Other | Admitting: Hematology and Oncology

## 2015-07-15 ENCOUNTER — Other Ambulatory Visit (HOSPITAL_BASED_OUTPATIENT_CLINIC_OR_DEPARTMENT_OTHER): Payer: Medicare Other

## 2015-07-15 ENCOUNTER — Encounter: Payer: Self-pay | Admitting: Hematology and Oncology

## 2015-07-15 ENCOUNTER — Ambulatory Visit (HOSPITAL_BASED_OUTPATIENT_CLINIC_OR_DEPARTMENT_OTHER): Payer: Medicare Other

## 2015-07-15 VITALS — BP 129/57 | HR 74 | Temp 98.0°F | Resp 18 | Ht 63.0 in | Wt 205.5 lb

## 2015-07-15 DIAGNOSIS — Z5112 Encounter for antineoplastic immunotherapy: Secondary | ICD-10-CM | POA: Diagnosis present

## 2015-07-15 DIAGNOSIS — C50412 Malignant neoplasm of upper-outer quadrant of left female breast: Secondary | ICD-10-CM | POA: Diagnosis not present

## 2015-07-15 LAB — CBC WITH DIFFERENTIAL/PLATELET
BASO%: 0.3 % (ref 0.0–2.0)
Basophils Absolute: 0 10*3/uL (ref 0.0–0.1)
EOS%: 2.8 % (ref 0.0–7.0)
Eosinophils Absolute: 0.2 10*3/uL (ref 0.0–0.5)
HEMATOCRIT: 33.6 % — AB (ref 34.8–46.6)
HEMOGLOBIN: 11.5 g/dL — AB (ref 11.6–15.9)
LYMPH#: 2.9 10*3/uL (ref 0.9–3.3)
LYMPH%: 36.3 % (ref 14.0–49.7)
MCH: 35.3 pg — ABNORMAL HIGH (ref 25.1–34.0)
MCHC: 34.2 g/dL (ref 31.5–36.0)
MCV: 103.2 fL — ABNORMAL HIGH (ref 79.5–101.0)
MONO#: 0.6 10*3/uL (ref 0.1–0.9)
MONO%: 7.6 % (ref 0.0–14.0)
NEUT#: 4.3 10*3/uL (ref 1.5–6.5)
NEUT%: 53 % (ref 38.4–76.8)
Platelets: 207 10*3/uL (ref 145–400)
RBC: 3.26 10*6/uL — ABNORMAL LOW (ref 3.70–5.45)
RDW: 14.8 % — AB (ref 11.2–14.5)
WBC: 8.1 10*3/uL (ref 3.9–10.3)

## 2015-07-15 LAB — COMPREHENSIVE METABOLIC PANEL
ALBUMIN: 3.6 g/dL (ref 3.5–5.0)
ALK PHOS: 74 U/L (ref 40–150)
ALT: 14 U/L (ref 0–55)
AST: 15 U/L (ref 5–34)
Anion Gap: 15 mEq/L — ABNORMAL HIGH (ref 3–11)
BUN: 15.6 mg/dL (ref 7.0–26.0)
CALCIUM: 10 mg/dL (ref 8.4–10.4)
CHLORIDE: 105 meq/L (ref 98–109)
CO2: 22 mEq/L (ref 22–29)
CREATININE: 1.1 mg/dL (ref 0.6–1.1)
EGFR: 52 mL/min/{1.73_m2} — ABNORMAL LOW (ref >90)
GLUCOSE: 127 mg/dL (ref 70–140)
POTASSIUM: 3.7 meq/L (ref 3.5–5.1)
SODIUM: 141 meq/L (ref 136–145)
Total Bilirubin: 0.43 mg/dL (ref 0.20–1.20)
Total Protein: 7.3 g/dL (ref 6.4–8.3)

## 2015-07-15 MED ORDER — DIPHENHYDRAMINE HCL 25 MG PO CAPS
ORAL_CAPSULE | ORAL | Status: AC
  Filled 2015-07-15: qty 2

## 2015-07-15 MED ORDER — SODIUM CHLORIDE 0.9 % IV SOLN
6.0000 mg/kg | Freq: Once | INTRAVENOUS | Status: AC
  Administered 2015-07-15: 567 mg via INTRAVENOUS
  Filled 2015-07-15: qty 27

## 2015-07-15 MED ORDER — ANASTROZOLE 1 MG PO TABS: 1.0000 mg | ORAL_TABLET | Freq: Every day | ORAL | Status: DC

## 2015-07-15 MED ORDER — ACETAMINOPHEN 325 MG PO TABS
650.0000 mg | ORAL_TABLET | Freq: Once | ORAL | Status: AC
  Administered 2015-07-15: 650 mg via ORAL

## 2015-07-15 MED ORDER — SODIUM CHLORIDE 0.9 % IJ SOLN
10.0000 mL | INTRAMUSCULAR | Status: DC | PRN
  Administered 2015-07-15: 10 mL
  Filled 2015-07-15: qty 10

## 2015-07-15 MED ORDER — DIPHENHYDRAMINE HCL 25 MG PO CAPS
50.0000 mg | ORAL_CAPSULE | Freq: Once | ORAL | Status: AC
  Administered 2015-07-15: 50 mg via ORAL

## 2015-07-15 MED ORDER — ACETAMINOPHEN 325 MG PO TABS
ORAL_TABLET | ORAL | Status: AC
  Filled 2015-07-15: qty 2

## 2015-07-15 MED ORDER — HEPARIN SOD (PORK) LOCK FLUSH 100 UNIT/ML IV SOLN
500.0000 [IU] | Freq: Once | INTRAVENOUS | Status: AC | PRN
  Administered 2015-07-15: 500 [IU]
  Filled 2015-07-15: qty 5

## 2015-07-15 MED ORDER — METOPROLOL SUCCINATE ER 25 MG PO TB24: 25.0000 mg | ORAL_TABLET | Freq: Every day | ORAL | Status: DC

## 2015-07-15 MED ORDER — SODIUM CHLORIDE 0.9 % IV SOLN
Freq: Once | INTRAVENOUS | Status: AC
  Administered 2015-07-15: 10:00:00 via INTRAVENOUS

## 2015-07-15 NOTE — Patient Instructions (Signed)
[facility] Discharge Instructions for Patients Receiving Chemotherapy  Today you received the following chemotherapy agents:  Herceptin  To help prevent nausea and vomiting after your treatment, we encourage you to take your nausea medication as prescribed.   If you develop nausea and vomiting that is not controlled by your nausea medication, call the clinic.   BELOW ARE SYMPTOMS THAT SHOULD BE REPORTED IMMEDIATELY:  *FEVER GREATER THAN 100.5 F  *CHILLS WITH OR WITHOUT FEVER  NAUSEA AND VOMITING THAT IS NOT CONTROLLED WITH YOUR NAUSEA MEDICATION  *UNUSUAL SHORTNESS OF BREATH  *UNUSUAL BRUISING OR BLEEDING  TENDERNESS IN MOUTH AND THROAT WITH OR WITHOUT PRESENCE OF ULCERS  *URINARY PROBLEMS  *BOWEL PROBLEMS  UNUSUAL RASH Items with * indicate a potential emergency and should be followed up as soon as possible.  Feel free to call the clinic you have any questions or concerns. The clinic phone number is [phone].  Please show the CHEMO ALERT CARD at check-in to the Emergency Department and triage nurse.   

## 2015-07-15 NOTE — Progress Notes (Signed)
OK to treat today without ECHO per Dr. Lindi Adie.

## 2015-07-15 NOTE — Addendum Note (Signed)
Addended by: Prentiss Bells on: 07/15/2015 06:51 PM   Modules accepted: Orders, Medications

## 2015-07-15 NOTE — Telephone Encounter (Signed)
Appointments made and avs printed for patient,i have called and left a message with kamilla at the [facility] for her to call with appt from the referrall/recall

## 2015-07-15 NOTE — Assessment & Plan Note (Signed)
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 Anastrozole 1 mg daily started 06/13/2015 for weak ER positivity (5%) 2. Herceptin maintenance to complete May 2017  Anastrozole toxicities:  Return to clinic in 6 weeks for follow-up

## 2015-07-15 NOTE — Progress Notes (Signed)
Patient Care Team: [name], MD as PCP - General  DIAGNOSIS: No matching staging information was found for the patient.  SUMMARY OF ONCOLOGIC HISTORY:   Breast cancer of upper-outer quadrant of left female breast ([place])   12/07/2014 Initial Diagnosis Left breast 2:00 position: Invasive ductal carcinoma grade 2, ER 5%, PR 0%, Ki-67 80%, HER-2 positive ratio 6.96   12/07/2014 Mammogram Large left breast mass 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, 2 small benign nodules in the right breast stable since 2002, other consistent with normal intramammary lymph node left axilla ultrasound normal-sized lymph nodes   12/31/2014 - 04/22/2015 Neo-Adjuvant Chemotherapy TCH Perjeta neoadjuvant chemotherapy 6 (decreased chemotherapy dosage with cycle 2 and discontinued Perjeta for diarrhea)    04/29/2015 Breast MRI Significant interval decrease in size of the mass, continued skin retraction, 4.3 x 2 x 1.8 cm (previously 4.9 x 3.9 x 3.9 cm)   05/30/2015 Surgery Left mastectomy: Path CR, 0/6 LN Negative    CHIEF COMPLIANT:  Follow-up on Herceptin maintenance  INTERVAL HISTORY: Savannah Howard is a  69 year old with above-mentioned history of left breast cancer who underwent neoadjuvant chemotherapy followed by mastectomy. She had complete pathologic response. Radiation was not recommended by tumor board. She is here on maintenance Herceptin. She reports no problems or concerns with Herceptin. She needs an echocardiogram to be done. She also needs to start on anastrozole.  REVIEW OF SYSTEMS:   Constitutional: Denies fevers, chills or abnormal weight loss Eyes: Denies blurriness of vision Ears, nose, mouth, throat, and face: Denies mucositis or sore throat Respiratory: Denies cough, dyspnea or wheezes Cardiovascular: Denies palpitation, chest discomfort or lower extremity swelling Gastrointestinal:  Denies nausea, heartburn or change in bowel habits Skin: Denies abnormal skin rashes Lymphatics: Denies new  lymphadenopathy or easy bruising Neurological:Denies numbness, tingling or new weaknesses Behavioral/Psych: Mood is stable, no new changes  Breast:  denies any pain or lumps or nodules in either breasts All other systems were reviewed with the patient and are negative.  I have reviewed the past medical history, past surgical history, social history and family history with the patient and they are unchanged from previous note.  ALLERGIES:  is allergic to penicillins and sulfonamide derivatives.  MEDICATIONS:  Current Outpatient Prescriptions  Medication Sig Dispense Refill  . acetaminophen (TYLENOL) 500 MG tablet Take 1,000 mg by mouth every 6 (six) hours as needed for mild pain.    Marland Kitchen anastrozole (ARIMIDEX) 1 MG tablet Take 1 tablet (1 mg total) by mouth daily. 90 tablet 3  . LORazepam (ATIVAN) 0.5 MG tablet TAKE 1 TABLET BY MOUTH 3 TIMES A DAY AS NEEDED FOR ANXIETY 270 tablet 1  . metoprolol succinate (TOPROL XL) 25 MG 24 hr tablet Take 1 tablet (25 mg total) by mouth daily.    Marland Kitchen omeprazole (PRILOSEC) 20 MG capsule Take 1 capsule (20 mg total) by mouth daily. 90 capsule 1  . potassium chloride (MICRO-K) 10 MEQ CR capsule Take 1 capsule (10 mEq total) by mouth daily. 90 capsule 3  . terazosin (HYTRIN) 5 MG capsule Take 1 capsule (5 mg total) by mouth at bedtime. 90 capsule 3  . vitamin B-12 (CYANOCOBALAMIN) 1000 MCG tablet Take 1,000 mcg by mouth daily.     No current facility-administered medications for this visit.    PHYSICAL EXAMINATION: ECOG PERFORMANCE STATUS: 1 - Symptomatic but completely ambulatory  Filed Vitals:   07/15/15 0852  BP: 129/57  Pulse: 74  Temp: 98 F (36.7 C)  Resp: 18  Filed Weights   07/15/15 0852  Weight: 205 lb 8 oz (93.214 kg)    GENERAL:alert, no distress and comfortable SKIN: skin color, texture, turgor are normal, no rashes or significant lesions EYES: normal, Conjunctiva are pink and non-injected, sclera clear OROPHARYNX:no exudate, no  erythema and lips, buccal mucosa, and tongue normal  NECK: supple, thyroid normal size, non-tender, without nodularity LYMPH:  no palpable lymphadenopathy in the cervical, axillary or inguinal LUNGS: clear to auscultation and percussion with normal breathing effort HEART:  Ejection systolic murmur in the mitral area ABDOMEN:abdomen soft, non-tender and normal bowel sounds Musculoskeletal:no cyanosis of digits and no clubbing  NEURO: alert & oriented x 3 with fluent speech, no focal motor/sensory deficits  LABORATORY DATA:  I have reviewed the data as listed   Chemistry      Component Value Date/Time   NA 143 06/24/2015 0815   NA 139 05/22/2015 0825   K 3.3* 06/24/2015 0815   K 3.9 05/22/2015 0825   CL 101 05/22/2015 0825   CO2 22 06/24/2015 0815   CO2 19* 05/22/2015 0825   BUN 19.8 06/24/2015 0815   BUN 16 05/22/2015 0825   CREATININE 1.2* 06/24/2015 0815   CREATININE 1.32* 05/22/2015 0825      Component Value Date/Time   CALCIUM 9.8 06/24/2015 0815   CALCIUM 9.6 05/22/2015 0825   ALKPHOS 66 06/24/2015 0815   ALKPHOS 68 11/05/2014 1456   AST 12 06/24/2015 0815   AST 15 11/05/2014 1456   ALT 12 06/24/2015 0815   ALT 14 11/05/2014 1456   BILITOT 0.30 06/24/2015 0815   BILITOT 0.4 11/05/2014 1456       Lab Results  Component Value Date   WBC 8.1 07/15/2015   HGB 11.5* 07/15/2015   HCT 33.6* 07/15/2015   MCV 103.2* 07/15/2015   PLT 207 07/15/2015   NEUTROABS 4.3 07/15/2015   ASSESSMENT & PLAN:  Breast cancer of upper-outer quadrant of left female breast ([place]) 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 Anastrozole 1 mg daily started 07/15/2015 for weak ER positivity (5%) 2. Herceptin maintenance to complete May 2017  she will also need bone density to be done at breast Center patient will need an echocardiogram scheduled. She is okay to treat without  a more recentechocardiogram.  Return to clinic in 6 weeks for follow-up   Orders Placed This Encounter  Procedures  . 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    Answer:  [facility]   The patient has a good understanding of the overall plan. she agrees with it. she will call with any problems that may  develop before the next visit here.   Rulon Eisenmenger, MD 07/15/2015

## 2015-07-16 ENCOUNTER — Ambulatory Visit
Admission: RE | Admit: 2015-07-16 | Discharge: 2015-07-16 | Disposition: A | Discharge status: Home / Self Care | Payer: Medicare Other | Source: Ambulatory Visit | Attending: Hematology and Oncology | Admitting: Hematology and Oncology

## 2015-07-16 DIAGNOSIS — M85851 Other specified disorders of bone density and structure, right thigh: Secondary | ICD-10-CM | POA: Diagnosis not present

## 2015-07-16 DIAGNOSIS — C50412 Malignant neoplasm of upper-outer quadrant of left female breast: Secondary | ICD-10-CM

## 2015-07-18 ENCOUNTER — Ambulatory Visit (HOSPITAL_COMMUNITY)
Admission: RE | Admit: 2015-07-18 | Discharge: 2015-07-18 | Disposition: A | Discharge status: Home / Self Care | Payer: Medicare Other | Source: Ambulatory Visit | Attending: Hematology and Oncology | Admitting: Hematology and Oncology

## 2015-07-18 DIAGNOSIS — I059 Rheumatic mitral valve disease, unspecified: Secondary | ICD-10-CM | POA: Diagnosis not present

## 2015-07-18 DIAGNOSIS — C50412 Malignant neoplasm of upper-outer quadrant of left female breast: Secondary | ICD-10-CM | POA: Diagnosis not present

## 2015-07-18 DIAGNOSIS — I10 Essential (primary) hypertension: Secondary | ICD-10-CM

## 2015-07-18 DIAGNOSIS — I071 Rheumatic tricuspid insufficiency: Secondary | ICD-10-CM | POA: Diagnosis not present

## 2015-07-18 NOTE — Progress Notes (Signed)
Echocardiogram 2D Echocardiogram has been performed.  Savannah Howard 07/18/2015, 3:47 PM

## 2015-07-26 ENCOUNTER — Ambulatory Visit (HOSPITAL_COMMUNITY)
Admission: RE | Admit: 2015-07-26 | Discharge: 2015-07-26 | Disposition: A | Discharge status: Home / Self Care | Payer: Medicare Other | Source: Ambulatory Visit | Attending: Internal Medicine | Admitting: Internal Medicine

## 2015-07-26 VITALS — BP 132/62 | HR 73 | Wt 203.5 lb

## 2015-07-26 DIAGNOSIS — E669 Obesity, unspecified: Secondary | ICD-10-CM | POA: Insufficient documentation

## 2015-07-26 DIAGNOSIS — C50412 Malignant neoplasm of upper-outer quadrant of left female breast: Secondary | ICD-10-CM | POA: Insufficient documentation

## 2015-07-26 DIAGNOSIS — I158 Other secondary hypertension: Secondary | ICD-10-CM | POA: Insufficient documentation

## 2015-07-26 NOTE — Progress Notes (Signed)
Patient ID: Savannah Howard, female   DOB: [birth year], 69 y.o.   MRN: [id number]   Cardio-Oncology CLINIC CONSULT NOTE  Oncologist/referring: Lindi Adie   HPI:  Ms. Savannah Howard is a 69 y/o woman with h/o HTN, obesity and left breast CA s/p TCH chemotherapy and left mastectomy referred by Dr. Lindi Adie for enrollment into the cardio-oncology program.   Breast cancer hisory as below:    Breast cancer of upper-outer quadrant of left female breast ([place])   12/07/2014 Initial Diagnosis Left breast 2:00 position: Invasive ductal carcinoma grade 2, ER 5%, PR 0%, Ki-67 80%, HER-2 positive ratio 6.96   12/07/2014 Mammogram Large left breast mass 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, 2 small benign nodules in the right breast stable since 2002, other consistent with normal intramammary lymph node left axilla ultrasound normal-sized lymph nodes   12/31/2014 - 04/22/2015 Neo-Adjuvant Chemotherapy TCH Perjeta neoadjuvant chemotherapy 6 (decreased chemotherapy dosage with cycle 2 and discontinued Perjeta for diarrhea)    04/29/2015 Breast MRI Significant interval decrease in size of the mass, continued skin retraction, 4.3 x 2 x 1.8 cm (previously 4.9 x 3.9 x 3.9 cm)   05/30/2015 Surgery Left mastectomy: Path CR, 0/6 LN Negative       Denies h/o known heart disease. About 15 years ago had stress test for CP in setting of HTN crisis. Stress test was normal.   Doing well. Started chemo in May 2016. Taking herceptin q 3weeks. No problems with therapy. No edema, orthopnea or PND. No angina. Does have some chest pain from mastectomy. + snoring  Echo 12/16: EF 60-65% Lat s' 10.3 GLS - 21.1% + aortic sclerosis (no stenosis)  Review of Systems: [y] = yes, '[ ]'$  = no   General: Weight gain '[ ]'$ ; Weight loss Blue.Reese ]; Anorexia '[ ]'$ ; Fatigue Blue.Reese ]; Fever '[ ]'$ ; Chills '[ ]'$ ; Weakness '[ ]'$   Cardiac: Chest pain/pressure '[ ]'$ ; Resting SOB '[ ]'$ ; Exertional SOB '[ ]'$ ; Orthopnea '[ ]'$ ; Pedal Edema '[ ]'$ ; Palpitations '[ ]'$ ; Syncope '[ ]'$ ; Presyncope '[ ]'$ ;  Paroxysmal nocturnal dyspnea'[ ]'$   Pulmonary: Cough '[ ]'$ ; Wheezing'[ ]'$ ; Hemoptysis'[ ]'$ ; Sputum '[ ]'$ ; Snoring '[ ]'$   GI: Vomiting'[ ]'$ ; Dysphagia'[ ]'$ ; Melena'[ ]'$ ; Hematochezia '[ ]'$ ; Heartburn'[ ]'$ ; Abdominal pain '[ ]'$ ; Constipation '[ ]'$ ; Diarrhea '[ ]'$ ; BRBPR '[ ]'$   GU: Hematuria'[ ]'$ ; Dysuria '[ ]'$ ; Nocturia'[ ]'$   Vascular: Pain in legs with walking '[ ]'$ ; Pain in feet with lying flat '[ ]'$ ; Non-healing sores '[ ]'$ ; Stroke '[ ]'$ ; TIA '[ ]'$ ; Slurred speech '[ ]'$ ;  Neuro: Headaches'[ ]'$ ; Vertigo'[ ]'$ ; Seizures'[ ]'$ ; Paresthesias'[ ]'$ ;Blurred vision '[ ]'$ ; Diplopia '[ ]'$ ; Vision changes '[ ]'$   Ortho/Skin: Arthritis Blue.Reese ]; Joint pain Blue.Reese ]; Muscle pain '[ ]'$ ; Joint swelling '[ ]'$ ; Back Pain '[ ]'$ ; Rash '[ ]'$   Psych: Depression'[ ]'$ ; Anxiety'[ ]'$   Heme: Bleeding problems '[ ]'$ ; Clotting disorders '[ ]'$ ; Anemia '[ ]'$   Endocrine: Diabetes '[ ]'$ ; Thyroid dysfunction'[ ]'$    Past Medical History  Diagnosis Date  . GERD (gastroesophageal reflux disease)   . Hypertension   . Obesity   . Vitamin B 12 deficiency   . Vitamin D deficiency   . Anxiety   . Allergy     rhinitis  . Kidney stone 1990's X 1    "passed it"  . History of blood transfusion 2016 X 3 (05/30/2015)  . Heart murmur   . Anemia   . DDD (degenerative disc disease), lumbar dx'd in 1980's  . Primary  cancer of upper outer quadrant of left breast ([place]) dx'd 03/2015    Current Outpatient Prescriptions  Medication Sig Dispense Refill  . acetaminophen (TYLENOL) 500 MG tablet Take 1,000 mg by mouth every 6 (six) hours as needed for mild pain.    Marland Kitchen anastrozole (ARIMIDEX) 1 MG tablet Take 1 tablet (1 mg total) by mouth daily. 90 tablet 3  . atorvastatin (LIPITOR) 10 MG tablet Take 10 mg by mouth daily.  3  . lidocaine-prilocaine (EMLA) cream APPLY TO AFFECTED AREA ONCE  3  . LORazepam (ATIVAN) 0.5 MG tablet TAKE 1 TABLET BY MOUTH 3 TIMES A DAY AS NEEDED FOR ANXIETY 270 tablet 1  . losartan-hydrochlorothiazide (HYZAAR) 100-25 MG tablet Take 1 tablet by mouth daily.  3  . metoprolol succinate (TOPROL XL) 25 MG 24  hr tablet Take 1 tablet (25 mg total) by mouth daily.    Marland Kitchen omeprazole (PRILOSEC) 20 MG capsule Take 1 capsule (20 mg total) by mouth daily. 90 capsule 1  . potassium chloride (MICRO-K) 10 MEQ CR capsule Take 1 capsule (10 mEq total) by mouth daily. 90 capsule 3  . terazosin (HYTRIN) 5 MG capsule Take 1 capsule (5 mg total) by mouth at bedtime. 90 capsule 3  . vitamin B-12 (CYANOCOBALAMIN) 1000 MCG tablet Take 1,000 mcg by mouth daily.     No current facility-administered medications for this encounter.    Allergies  Allergen Reactions  . Penicillins     REACTION: genital swelling and skin peeled ? yeast infection  . Sulfonamide Derivatives       Social History   Social History  . Marital Status: Single    Spouse Name: N/A  . Number of Children: N/A  . Years of Education: N/A   Occupational History  . [employer]    Social History Main Topics  . Smoking status: Never Smoker   . Smokeless tobacco: Never Used  . Alcohol Use: Yes     Comment: 05/30/2015 "I might take a drink of wine 1-2 times/yr"  . Drug Use: No  . Sexual Activity: Not Currently   Other Topics Concern  . Not on file   Social History Narrative   Regular exercise- no      Family History  Problem Relation Age of Onset  . Hypertension Other   . Heart disease Mother     CAD  . COPD Mother   . Heart disease Father 69    leaky valve  . Parkinsonism Father     Danley Danker Vitals:   07/26/15 1143  BP: 132/62  Pulse: 73  Weight: 203 lb 8 oz (92.307 kg)  SpO2: 97%    PHYSICAL EXAM: General:  Well appearing. No respiratory difficulty HEENT: normal x alopecia Neck: supple. no JVD. + RIJ portacath Carotids 2+ bilat; no bruits. No lymphadenopathy or thryomegaly appreciated. Cor: PMI nondisplaced. Regular rate & rhythm. No rubs, gallops. 2/6 SEM RUSB Lungs: clear Abdomen: obese soft, nontender, nondistended. No hepatosplenomegaly. No bruits or masses. Good bowel sounds. Extremities: no cyanosis,  clubbing, rash, edema Neuro: alert & oriented x 3, cranial nerves grossly intact. moves all 4 extremities w/o difficulty. Affect pleasant.   ASSESSMENT & PLAN: 1. Breast CA 2. HTN  3. Obesity  Explained incidence of Herceptin cardiotoxicity and role of Cardio-oncology clinic at length. Echo images reviewed personally. All parameters stable. Reviewed signs and symptoms of HF to look for. Continue Herceptin. Follow-up with echo in 3 months.  Bensimhon, Daniel,MD 12:04 PM

## 2015-07-26 NOTE — Patient Instructions (Signed)
We will contact you in 3 months to schedule your next appointment and echocardiogram  

## 2015-08-01 ENCOUNTER — Other Ambulatory Visit: Payer: Self-pay | Admitting: *Deleted

## 2015-08-01 DIAGNOSIS — C50412 Malignant neoplasm of upper-outer quadrant of left female breast: Secondary | ICD-10-CM

## 2015-08-06 ENCOUNTER — Ambulatory Visit (HOSPITAL_BASED_OUTPATIENT_CLINIC_OR_DEPARTMENT_OTHER): Payer: Medicare Other

## 2015-08-06 ENCOUNTER — Other Ambulatory Visit (HOSPITAL_BASED_OUTPATIENT_CLINIC_OR_DEPARTMENT_OTHER): Payer: Medicare Other

## 2015-08-06 VITALS — BP 155/63 | HR 68 | Temp 98.1°F | Resp 20

## 2015-08-06 DIAGNOSIS — Z5112 Encounter for antineoplastic immunotherapy: Secondary | ICD-10-CM | POA: Diagnosis not present

## 2015-08-06 DIAGNOSIS — C50412 Malignant neoplasm of upper-outer quadrant of left female breast: Secondary | ICD-10-CM

## 2015-08-06 LAB — CBC WITH DIFFERENTIAL/PLATELET
BASO%: 0.4 % (ref 0.0–2.0)
BASOS ABS: 0 10*3/uL (ref 0.0–0.1)
EOS%: 2.3 % (ref 0.0–7.0)
Eosinophils Absolute: 0.2 10*3/uL (ref 0.0–0.5)
HEMATOCRIT: 33.9 % — AB (ref 34.8–46.6)
HEMOGLOBIN: 11.6 g/dL (ref 11.6–15.9)
LYMPH#: 2.9 10*3/uL (ref 0.9–3.3)
LYMPH%: 40.4 % (ref 14.0–49.7)
MCH: 34.4 pg — ABNORMAL HIGH (ref 25.1–34.0)
MCHC: 34.2 g/dL (ref 31.5–36.0)
MCV: 100.5 fL (ref 79.5–101.0)
MONO#: 0.5 10*3/uL (ref 0.1–0.9)
MONO%: 7.6 % (ref 0.0–14.0)
NEUT#: 3.5 10*3/uL (ref 1.5–6.5)
NEUT%: 49.3 % (ref 38.4–76.8)
PLATELETS: 227 10*3/uL (ref 145–400)
RBC: 3.38 10*6/uL — ABNORMAL LOW (ref 3.70–5.45)
RDW: 14.2 % (ref 11.2–14.5)
WBC: 7.1 10*3/uL (ref 3.9–10.3)

## 2015-08-06 LAB — COMPREHENSIVE METABOLIC PANEL
ALBUMIN: 3.4 g/dL — AB (ref 3.5–5.0)
ALK PHOS: 71 U/L (ref 40–150)
ALT: 12 U/L (ref 0–55)
ANION GAP: 14 meq/L — AB (ref 3–11)
AST: 11 U/L (ref 5–34)
BILIRUBIN TOTAL: 0.32 mg/dL (ref 0.20–1.20)
BUN: 21 mg/dL (ref 7.0–26.0)
CALCIUM: 9.2 mg/dL (ref 8.4–10.4)
CHLORIDE: 105 meq/L (ref 98–109)
CO2: 22 mEq/L (ref 22–29)
CREATININE: 1.1 mg/dL (ref 0.6–1.1)
EGFR: 51 mL/min/{1.73_m2} — ABNORMAL LOW (ref >90)
Glucose: 120 mg/dl (ref 70–140)
Potassium: 3.3 mEq/L — ABNORMAL LOW (ref 3.5–5.1)
Sodium: 142 mEq/L (ref 136–145)
Total Protein: 7.2 g/dL (ref 6.4–8.3)

## 2015-08-06 MED ORDER — ACETAMINOPHEN 325 MG PO TABS
650.0000 mg | ORAL_TABLET | Freq: Once | ORAL | Status: AC
  Administered 2015-08-06: 650 mg via ORAL

## 2015-08-06 MED ORDER — HEPARIN SOD (PORK) LOCK FLUSH 100 UNIT/ML IV SOLN
500.0000 [IU] | Freq: Once | INTRAVENOUS | Status: AC | PRN
  Administered 2015-08-06: 500 [IU]
  Filled 2015-08-06: qty 5

## 2015-08-06 MED ORDER — DIPHENHYDRAMINE HCL 25 MG PO CAPS
50.0000 mg | ORAL_CAPSULE | Freq: Once | ORAL | Status: AC
  Administered 2015-08-06: 50 mg via ORAL

## 2015-08-06 MED ORDER — SODIUM CHLORIDE 0.9 % IJ SOLN
10.0000 mL | INTRAMUSCULAR | Status: DC | PRN
  Administered 2015-08-06: 10 mL
  Filled 2015-08-06: qty 10

## 2015-08-06 MED ORDER — DIPHENHYDRAMINE HCL 25 MG PO CAPS
ORAL_CAPSULE | ORAL | Status: AC
  Filled 2015-08-06: qty 2

## 2015-08-06 MED ORDER — ACETAMINOPHEN 325 MG PO TABS
ORAL_TABLET | ORAL | Status: AC
  Filled 2015-08-06: qty 2

## 2015-08-06 MED ORDER — TRASTUZUMAB CHEMO INJECTION 440 MG
6.0000 mg/kg | Freq: Once | INTRAVENOUS | Status: AC
  Administered 2015-08-06: 567 mg via INTRAVENOUS
  Filled 2015-08-06: qty 27

## 2015-08-06 MED ORDER — SODIUM CHLORIDE 0.9 % IV SOLN
Freq: Once | INTRAVENOUS | Status: AC
  Administered 2015-08-06: 09:00:00 via INTRAVENOUS

## 2015-08-06 NOTE — Patient Instructions (Signed)
[facility] Discharge Instructions for Patients Receiving Chemotherapy  Today you received the following chemotherapy agents; Herceptin     BELOW ARE SYMPTOMS THAT SHOULD BE REPORTED IMMEDIATELY:  *FEVER GREATER THAN 100.5 F  *CHILLS WITH OR WITHOUT FEVER  NAUSEA AND VOMITING THAT IS NOT CONTROLLED WITH YOUR NAUSEA MEDICATION  *UNUSUAL SHORTNESS OF BREATH  *UNUSUAL BRUISING OR BLEEDING  TENDERNESS IN MOUTH AND THROAT WITH OR WITHOUT PRESENCE OF ULCERS  *URINARY PROBLEMS  *BOWEL PROBLEMS  UNUSUAL RASH Items with * indicate a potential emergency and should be followed up as soon as possible.  Feel free to call the clinic you have any questions or concerns. The clinic phone number is [phone].  Please show the CHEMO ALERT CARD at check-in to the Emergency Department and triage nurse.   

## 2015-08-26 ENCOUNTER — Ambulatory Visit (HOSPITAL_BASED_OUTPATIENT_CLINIC_OR_DEPARTMENT_OTHER): Payer: Medicare Other | Admitting: Hematology and Oncology

## 2015-08-26 ENCOUNTER — Telehealth: Payer: Self-pay | Admitting: Hematology and Oncology

## 2015-08-26 ENCOUNTER — Encounter: Payer: Self-pay | Admitting: Hematology and Oncology

## 2015-08-26 ENCOUNTER — Other Ambulatory Visit (HOSPITAL_BASED_OUTPATIENT_CLINIC_OR_DEPARTMENT_OTHER): Payer: Medicare Other

## 2015-08-26 ENCOUNTER — Ambulatory Visit (HOSPITAL_BASED_OUTPATIENT_CLINIC_OR_DEPARTMENT_OTHER): Payer: Medicare Other

## 2015-08-26 ENCOUNTER — Encounter: Payer: Self-pay | Admitting: *Deleted

## 2015-08-26 VITALS — BP 147/88 | HR 74 | Temp 97.7°F | Resp 20 | Wt 206.9 lb

## 2015-08-26 DIAGNOSIS — M8580 Other specified disorders of bone density and structure, unspecified site: Secondary | ICD-10-CM | POA: Diagnosis not present

## 2015-08-26 DIAGNOSIS — C50412 Malignant neoplasm of upper-outer quadrant of left female breast: Secondary | ICD-10-CM

## 2015-08-26 DIAGNOSIS — Z5112 Encounter for antineoplastic immunotherapy: Secondary | ICD-10-CM | POA: Diagnosis not present

## 2015-08-26 LAB — CBC WITH DIFFERENTIAL/PLATELET
BASO%: 0 % (ref 0.0–2.0)
BASOS ABS: 0 10*3/uL (ref 0.0–0.1)
EOS ABS: 0.2 10*3/uL (ref 0.0–0.5)
EOS%: 2.3 % (ref 0.0–7.0)
HCT: 35.4 % (ref 34.8–46.6)
HGB: 12 g/dL (ref 11.6–15.9)
LYMPH%: 44.3 % (ref 14.0–49.7)
MCH: 34.1 pg — AB (ref 25.1–34.0)
MCHC: 33.9 g/dL (ref 31.5–36.0)
MCV: 100.6 fL (ref 79.5–101.0)
MONO#: 0.5 10*3/uL (ref 0.1–0.9)
MONO%: 6.4 % (ref 0.0–14.0)
NEUT%: 47 % (ref 38.4–76.8)
NEUTROS ABS: 3.3 10*3/uL (ref 1.5–6.5)
PLATELETS: 219 10*3/uL (ref 145–400)
RBC: 3.52 10*6/uL — AB (ref 3.70–5.45)
RDW: 13.1 % (ref 11.2–14.5)
WBC: 7 10*3/uL (ref 3.9–10.3)
lymph#: 3.1 10*3/uL (ref 0.9–3.3)

## 2015-08-26 LAB — COMPREHENSIVE METABOLIC PANEL
ALBUMIN: 3.6 g/dL (ref 3.5–5.0)
ALK PHOS: 77 U/L (ref 40–150)
ALT: 13 U/L (ref 0–55)
ANION GAP: 13 meq/L — AB (ref 3–11)
AST: 12 U/L (ref 5–34)
BILIRUBIN TOTAL: 0.37 mg/dL (ref 0.20–1.20)
BUN: 26.5 mg/dL — ABNORMAL HIGH (ref 7.0–26.0)
CALCIUM: 9.9 mg/dL (ref 8.4–10.4)
CO2: 24 mEq/L (ref 22–29)
CREATININE: 1.2 mg/dL — AB (ref 0.6–1.1)
Chloride: 104 mEq/L (ref 98–109)
EGFR: 44 mL/min/{1.73_m2} — ABNORMAL LOW (ref >90)
Glucose: 125 mg/dl (ref 70–140)
Potassium: 4.3 mEq/L (ref 3.5–5.1)
Sodium: 141 mEq/L (ref 136–145)
TOTAL PROTEIN: 7.4 g/dL (ref 6.4–8.3)

## 2015-08-26 MED ORDER — ACETAMINOPHEN 325 MG PO TABS
ORAL_TABLET | ORAL | Status: AC
Start: 2015-08-26 — End: 2015-08-26
  Filled 2015-08-26: qty 2

## 2015-08-26 MED ORDER — SODIUM CHLORIDE 0.9 % IV SOLN
Freq: Once | INTRAVENOUS | Status: AC
  Administered 2015-08-26: 10:00:00 via INTRAVENOUS

## 2015-08-26 MED ORDER — HEPARIN SOD (PORK) LOCK FLUSH 100 UNIT/ML IV SOLN
500.0000 [IU] | Freq: Once | INTRAVENOUS | Status: AC | PRN
  Administered 2015-08-26: 500 [IU]
  Filled 2015-08-26: qty 5

## 2015-08-26 MED ORDER — DIPHENHYDRAMINE HCL 25 MG PO CAPS
ORAL_CAPSULE | ORAL | Status: AC
  Filled 2015-08-26: qty 2

## 2015-08-26 MED ORDER — ACETAMINOPHEN 325 MG PO TABS
650.0000 mg | ORAL_TABLET | Freq: Once | ORAL | Status: AC
  Administered 2015-08-26: 650 mg via ORAL

## 2015-08-26 MED ORDER — TRASTUZUMAB CHEMO INJECTION 440 MG
6.0000 mg/kg | Freq: Once | INTRAVENOUS | Status: AC
  Administered 2015-08-26: 567 mg via INTRAVENOUS
  Filled 2015-08-26: qty 27

## 2015-08-26 MED ORDER — DIPHENHYDRAMINE HCL 25 MG PO CAPS
50.0000 mg | ORAL_CAPSULE | Freq: Once | ORAL | Status: AC
  Administered 2015-08-26: 50 mg via ORAL

## 2015-08-26 MED ORDER — SODIUM CHLORIDE 0.9 % IJ SOLN
10.0000 mL | INTRAMUSCULAR | Status: DC | PRN
  Administered 2015-08-26: 10 mL
  Filled 2015-08-26: qty 10

## 2015-08-26 NOTE — Progress Notes (Signed)
Patient Care Team: [name], MD as PCP - General  DIAGNOSIS: No matching staging information was found for the patient.  SUMMARY OF ONCOLOGIC HISTORY:   Breast cancer of upper-outer quadrant of left female breast ([place])   12/07/2014 Initial Diagnosis Left breast 2:00 position: Invasive ductal carcinoma grade 2, ER 5%, PR 0%, Ki-67 80%, HER-2 positive ratio 6.96   12/07/2014 Mammogram Large left breast mass 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, 2 small benign nodules in the right breast stable since 2002, other consistent with normal intramammary lymph node left axilla ultrasound normal-sized lymph nodes   12/31/2014 - 04/22/2015 Neo-Adjuvant Chemotherapy [facility] Perjeta neoadjuvant chemotherapy 6 (decreased chemotherapy dosage with cycle 2 and discontinued Perjeta for diarrhea)    04/29/2015 Breast MRI Significant interval decrease in size of the mass, continued skin retraction, 4.3 x 2 x 1.8 cm (previously 4.9 x 3.9 x 3.9 cm)   05/30/2015 Surgery Left mastectomy: Path CR, 0/6 LN Negative    CHIEF COMPLIANT: Follow-up on Herceptin  INTERVAL HISTORY: Savannah Howard is a 70 year old with above-mentioned history of left breast cancer treated with neoadjuvant chemotherapy followed by mastectomy and is currently on Herceptin maintenance. She is tolerating Herceptin extremely well without Savannah major problems or concerns. We prescribed her anastrozole but she did not fill it because it cost $400 given the fact that she did not have Medicare prescription coverage. So she has not purchased the medication and has not started it yet. She reports her general health has been good and reports no problems or concerns with Herceptin.  REVIEW OF SYSTEMS:   Constitutional: Denies fevers, chills or abnormal weight loss Eyes: Denies blurriness of vision Ears, nose, mouth, throat, and face: Denies mucositis or sore throat Respiratory: Denies cough, dyspnea or wheezes Cardiovascular: Denies palpitation, chest  discomfort Gastrointestinal:  Denies nausea, heartburn or change in bowel habits Skin: Denies abnormal skin rashes Lymphatics: Denies new lymphadenopathy or easy bruising Neurological:Denies numbness, tingling or new weaknesses Behavioral/Psych: Mood is stable, no new changes  Extremities: No lower extremity edema  All other systems were reviewed with the patient and are negative.  I have reviewed the past medical history, past surgical history, social history and family history with the patient and they are unchanged from previous note.  ALLERGIES:  is allergic to penicillins and sulfonamide derivatives.  MEDICATIONS:  Current Outpatient Prescriptions  Medication Sig Dispense Refill  . acetaminophen (TYLENOL) 500 MG tablet Take 1,000 mg by mouth every 6 (six) hours as needed for mild pain.    Marland Kitchen anastrozole (ARIMIDEX) 1 MG tablet Take 1 tablet (1 mg total) by mouth daily. (Patient not taking: Reported on 07/26/2015) 90 tablet 3  . lidocaine-prilocaine (EMLA) cream APPLY TO AFFECTED AREA ONCE  3  . LORazepam (ATIVAN) 0.5 MG tablet TAKE 1 TABLET BY MOUTH 3 TIMES A DAY AS NEEDED FOR ANXIETY 270 tablet 1  . losartan-hydrochlorothiazide (HYZAAR) 100-25 MG tablet Take 1 tablet by mouth daily.  3  . metoprolol succinate (TOPROL XL) 25 MG 24 hr tablet Take 1 tablet (25 mg total) by mouth daily.    Marland Kitchen omeprazole (PRILOSEC) 20 MG capsule Take 1 capsule (20 mg total) by mouth daily. 90 capsule 1  . potassium chloride (MICRO-K) 10 MEQ CR capsule Take 1 capsule (10 mEq total) by mouth daily. 90 capsule 3  . terazosin (HYTRIN) 5 MG capsule Take 1 capsule (5 mg total) by mouth at bedtime. 90 capsule 3  . vitamin B-12 (CYANOCOBALAMIN) 1000 MCG tablet Take  1,000 mcg by mouth daily.     No current facility-administered medications for this visit.    PHYSICAL EXAMINATION: ECOG PERFORMANCE STATUS: 1 - Symptomatic but completely ambulatory  Filed Vitals:   08/26/15 0913  BP: 147/88  Pulse: 74  Temp:  97.7 F (36.5 C)  Resp: 20   Filed Weights   08/26/15 0913  Weight: 206 lb 14.4 oz (93.849 kg)    GENERAL:alert, no distress and comfortable SKIN: skin color, texture, turgor are normal, no rashes or significant lesions EYES: normal, Conjunctiva are pink and non-injected, sclera clear OROPHARYNX:no exudate, no erythema and lips, buccal mucosa, and tongue normal  NECK: supple, thyroid normal size, non-tender, without nodularity LYMPH:  no palpable lymphadenopathy in the cervical, axillary or inguinal LUNGS: clear to auscultation and percussion with normal breathing effort HEART: regular rate & rhythm and no murmurs and no lower extremity edema ABDOMEN:abdomen soft, non-tender and normal bowel sounds MUSCULOSKELETAL:no cyanosis of digits and no clubbing  NEURO: alert & oriented x 3 with fluent speech, no focal motor/sensory deficits EXTREMITIES: No lower extremity edema   LABORATORY DATA:  I have reviewed the data as listed   Chemistry      Component Value Date/Time   NA 142 08/06/2015 0823   NA 139 05/22/2015 0825   K 3.3* 08/06/2015 0823   K 3.9 05/22/2015 0825   CL 101 05/22/2015 0825   CO2 22 08/06/2015 0823   CO2 19* 05/22/2015 0825   BUN 21.0 08/06/2015 0823   BUN 16 05/22/2015 0825   CREATININE 1.1 08/06/2015 0823   CREATININE 1.32* 05/22/2015 0825      Component Value Date/Time   CALCIUM 9.2 08/06/2015 0823   CALCIUM 9.6 05/22/2015 0825   ALKPHOS 71 08/06/2015 0823   ALKPHOS 68 11/05/2014 1456   AST 11 08/06/2015 0823   AST 15 11/05/2014 1456   ALT 12 08/06/2015 0823   ALT 14 11/05/2014 1456   BILITOT 0.32 08/06/2015 0823   BILITOT 0.4 11/05/2014 1456       Lab Results  Component Value Date   WBC 7.0 08/26/2015   HGB 12.0 08/26/2015   HCT 35.4 08/26/2015   MCV 100.6 08/26/2015   PLT 219 08/26/2015   NEUTROABS 3.3 08/26/2015     ASSESSMENT & PLAN:  Breast cancer of upper-outer quadrant of left female breast ([place]) Left breast invasive ductal  carcinoma, 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 2:00 position, no lymph nodes, grade 2-3, ER 5% PR 0% HER-2 positive ratio 6.96, Ki-67 is 80% T3 N0 M0 stage IIB clinical stage  Treatment summary: TCH-Perjeta 6 cycles started 12/31/2014 completed 04/22/2015 (decreased chemotherapy dosage with cycle 2 and discontinued Perjeta for diarrhea) Breast MRI 04/29/2015: Significant interval decrease in size of the mass, continued skin retraction, 4.3 x 2 x 1.8 cm (previously 4.9 x 3.9 x 3.9 cm) Left mastectomy 05/30/2015: CR to chemo, 0/6 LN  Did not need radiation based upon tumor board recommendation  Current treatment:  1. Anastrozole 1 mg daily was supposed to start 07/15/2015 for weak ER positivity (5%) but she has not started because of lack of prescription coverage and [facility] asked her $400 for the prescription. We will try to see whether she can get the medicine cheaper as well as to see if there are [employer] foundation funds to help her pay for it. 2. Herceptin maintenance to complete May 2017 Echocardiogram 07/18/2015: EF 60-65% normal  Osteopenia: Bone density done 07/14/2015: Revealed a T score of -1.6, encouraged her to take  calcium and vitamin D  Return to clinic in 6 weeks for follow-up   No orders of the defined types were placed in this encounter.   The patient has a good understanding of the overall plan. she agrees with it. she will call with Savannah problems that may develop before the next visit here.   Rulon Eisenmenger, MD 08/26/2015

## 2015-08-26 NOTE — Telephone Encounter (Signed)
Appointments made and avs printed for patient °

## 2015-08-26 NOTE — Progress Notes (Signed)
Received email from [facility] stating patient has financial concern with Anastrozole expense.Called pt to obtain income information to apply for copay assistance with PAF. Pt did not answer,left voicemail.

## 2015-08-26 NOTE — Patient Instructions (Signed)
[facility] Discharge Instructions for Patients Receiving Chemotherapy  Today you received the following chemotherapy agents: Herceptin   To help prevent nausea and vomiting after your treatment, we encourage you to take your nausea medication as directed.    If you develop nausea and vomiting that is not controlled by your nausea medication, call the clinic.   BELOW ARE SYMPTOMS THAT SHOULD BE REPORTED IMMEDIATELY:  *FEVER GREATER THAN 100.5 F  *CHILLS WITH OR WITHOUT FEVER  NAUSEA AND VOMITING THAT IS NOT CONTROLLED WITH YOUR NAUSEA MEDICATION  *UNUSUAL SHORTNESS OF BREATH  *UNUSUAL BRUISING OR BLEEDING  TENDERNESS IN MOUTH AND THROAT WITH OR WITHOUT PRESENCE OF ULCERS  *URINARY PROBLEMS  *BOWEL PROBLEMS  UNUSUAL RASH Items with * indicate a potential emergency and should be followed up as soon as possible.  Feel free to call the clinic you have any questions or concerns. The clinic phone number is [phone].  Please show the CHEMO ALERT CARD at check-in to the Emergency Department and triage nurse.   

## 2015-08-26 NOTE — Progress Notes (Signed)
Per pharmacy, herceptin to run over 30 mins, not 90 mins per label

## 2015-08-26 NOTE — Assessment & Plan Note (Signed)
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, no lymph nodes, grade 2-3, ER 5% PR 0% HER-2 positive ratio 6.96, Ki-67 is 80% T3 N0 M0 stage IIB clinical stage  Treatment summary: TCH-Perjeta 6 cycles started 12/31/2014 completed 04/22/2015 (decreased chemotherapy dosage with cycle 2 and discontinued Perjeta for diarrhea) Breast MRI 04/29/2015: Significant interval decrease in size of the mass, continued skin retraction, 4.3 x 2 x 1.8 cm (previously 4.9 x 3.9 x 3.9 cm) Left mastectomy 05/30/2015: CR to chemo, 0/6 LN  Did not need radiation based upon tumor board recommendation  Current treatment:  1. Anastrozole 1 mg daily started 07/15/2015 for weak ER positivity (5%) 2. Herceptin maintenance to complete May 2017 Echocardiogram 07/18/2015: EF 60-65% normal  Osteopenia: Bone density done 07/14/2015: Revealed a T score of -1.6, encouraged her to take calcium and vitamin D  Return to clinic in 6 weeks for follow-up

## 2015-08-27 ENCOUNTER — Encounter: Payer: Self-pay | Admitting: Hematology and Oncology

## 2015-08-27 NOTE — Progress Notes (Signed)
Called patient and left another voicemail to return my call stating trying to get assistance for Anastrozole. Left my return phone number.

## 2015-09-16 ENCOUNTER — Ambulatory Visit (HOSPITAL_BASED_OUTPATIENT_CLINIC_OR_DEPARTMENT_OTHER): Payer: Medicare Other

## 2015-09-16 ENCOUNTER — Other Ambulatory Visit (HOSPITAL_BASED_OUTPATIENT_CLINIC_OR_DEPARTMENT_OTHER): Payer: Medicare Other

## 2015-09-16 VITALS — BP 132/53 | HR 64 | Temp 97.0°F | Resp 19

## 2015-09-16 DIAGNOSIS — C50412 Malignant neoplasm of upper-outer quadrant of left female breast: Secondary | ICD-10-CM

## 2015-09-16 DIAGNOSIS — Z5112 Encounter for antineoplastic immunotherapy: Secondary | ICD-10-CM

## 2015-09-16 LAB — CBC WITH DIFFERENTIAL/PLATELET
BASO%: 0.4 % (ref 0.0–2.0)
BASOS ABS: 0 10*3/uL (ref 0.0–0.1)
EOS%: 1.6 % (ref 0.0–7.0)
Eosinophils Absolute: 0.1 10*3/uL (ref 0.0–0.5)
HEMATOCRIT: 38.1 % (ref 34.8–46.6)
HGB: 12.8 g/dL (ref 11.6–15.9)
LYMPH#: 3 10*3/uL (ref 0.9–3.3)
LYMPH%: 39.4 % (ref 14.0–49.7)
MCH: 33.2 pg (ref 25.1–34.0)
MCHC: 33.7 g/dL (ref 31.5–36.0)
MCV: 98.5 fL (ref 79.5–101.0)
MONO#: 0.6 10*3/uL (ref 0.1–0.9)
MONO%: 7.4 % (ref 0.0–14.0)
NEUT#: 3.9 10*3/uL (ref 1.5–6.5)
NEUT%: 51.2 % (ref 38.4–76.8)
PLATELETS: 193 10*3/uL (ref 145–400)
RBC: 3.86 10*6/uL (ref 3.70–5.45)
RDW: 13.5 % (ref 11.2–14.5)
WBC: 7.6 10*3/uL (ref 3.9–10.3)

## 2015-09-16 LAB — COMPREHENSIVE METABOLIC PANEL
ALT: 11 U/L (ref 0–55)
AST: 11 U/L (ref 5–34)
Albumin: 3.8 g/dL (ref 3.5–5.0)
Alkaline Phosphatase: 70 U/L (ref 40–150)
Anion Gap: 16 mEq/L — ABNORMAL HIGH (ref 3–11)
BILIRUBIN TOTAL: 0.39 mg/dL (ref 0.20–1.20)
BUN: 25.4 mg/dL (ref 7.0–26.0)
CO2: 18 meq/L — AB (ref 22–29)
CREATININE: 1.4 mg/dL — AB (ref 0.6–1.1)
Calcium: 9.9 mg/dL (ref 8.4–10.4)
Chloride: 105 mEq/L (ref 98–109)
EGFR: 39 mL/min/{1.73_m2} — ABNORMAL LOW (ref >90)
GLUCOSE: 134 mg/dL (ref 70–140)
Potassium: 3.4 mEq/L — ABNORMAL LOW (ref 3.5–5.1)
SODIUM: 139 meq/L (ref 136–145)
TOTAL PROTEIN: 7.7 g/dL (ref 6.4–8.3)

## 2015-09-16 MED ORDER — SODIUM CHLORIDE 0.9 % IV SOLN
Freq: Once | INTRAVENOUS | Status: AC
  Administered 2015-09-16: 09:00:00 via INTRAVENOUS

## 2015-09-16 MED ORDER — DIPHENHYDRAMINE HCL 25 MG PO CAPS
50.0000 mg | ORAL_CAPSULE | Freq: Once | ORAL | Status: AC
  Administered 2015-09-16: 50 mg via ORAL

## 2015-09-16 MED ORDER — ACETAMINOPHEN 325 MG PO TABS
ORAL_TABLET | ORAL | Status: AC
  Filled 2015-09-16: qty 2

## 2015-09-16 MED ORDER — TRASTUZUMAB CHEMO INJECTION 440 MG
6.0000 mg/kg | Freq: Once | INTRAVENOUS | Status: AC
  Administered 2015-09-16: 567 mg via INTRAVENOUS
  Filled 2015-09-16: qty 27

## 2015-09-16 MED ORDER — HEPARIN SOD (PORK) LOCK FLUSH 100 UNIT/ML IV SOLN
500.0000 [IU] | Freq: Once | INTRAVENOUS | Status: AC | PRN
  Administered 2015-09-16: 500 [IU]
  Filled 2015-09-16: qty 5

## 2015-09-16 MED ORDER — DIPHENHYDRAMINE HCL 25 MG PO CAPS
ORAL_CAPSULE | ORAL | Status: AC
  Filled 2015-09-16: qty 2

## 2015-09-16 MED ORDER — SODIUM CHLORIDE 0.9 % IJ SOLN
10.0000 mL | INTRAMUSCULAR | Status: DC | PRN
  Administered 2015-09-16: 10 mL
  Filled 2015-09-16: qty 10

## 2015-09-16 MED ORDER — ACETAMINOPHEN 325 MG PO TABS
650.0000 mg | ORAL_TABLET | Freq: Once | ORAL | Status: AC
  Administered 2015-09-16: 650 mg via ORAL

## 2015-09-16 NOTE — Progress Notes (Signed)
Pt K+ 3.4 with today's labs.  Pt currently taking 10 mEq K+ PO daily.  Per Dr. Lindi Adie, MD pt to resume current K+ regimen.  Pt educated to increase K+ rich foods in daily diet as well.  Pt informed of plan and no further questions or concerns at this time.

## 2015-09-16 NOTE — Patient Instructions (Signed)
[facility] Discharge Instructions for Patients Receiving Chemotherapy  Today you received the following chemotherapy agents: Herceptin   To help prevent nausea and vomiting after your treatment, we encourage you to take your nausea medication as directed.    If you develop nausea and vomiting that is not controlled by your nausea medication, call the clinic.   BELOW ARE SYMPTOMS THAT SHOULD BE REPORTED IMMEDIATELY:  *FEVER GREATER THAN 100.5 F  *CHILLS WITH OR WITHOUT FEVER  NAUSEA AND VOMITING THAT IS NOT CONTROLLED WITH YOUR NAUSEA MEDICATION  *UNUSUAL SHORTNESS OF BREATH  *UNUSUAL BRUISING OR BLEEDING  TENDERNESS IN MOUTH AND THROAT WITH OR WITHOUT PRESENCE OF ULCERS  *URINARY PROBLEMS  *BOWEL PROBLEMS  UNUSUAL RASH Items with * indicate a potential emergency and should be followed up as soon as possible.  Feel free to call the clinic you have any questions or concerns. The clinic phone number is [phone].  Please show the CHEMO ALERT CARD at check-in to the Emergency Department and triage nurse.   

## 2015-09-27 ENCOUNTER — Telehealth (HOSPITAL_COMMUNITY): Payer: Self-pay | Admitting: Vascular Surgery

## 2015-09-27 NOTE — Telephone Encounter (Signed)
Left pt message to make f/u appt w/ ECHO 

## 2015-10-04 ENCOUNTER — Telehealth (HOSPITAL_COMMUNITY): Payer: Self-pay | Admitting: Vascular Surgery

## 2015-10-04 NOTE — Telephone Encounter (Signed)
Left pt message to make f/u appt w/ ECHO 

## 2015-10-06 NOTE — Assessment & Plan Note (Signed)
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, no lymph nodes, grade 2-3, ER 5% PR 0% HER-2 positive ratio 6.96, Ki-67 is 80% T3 N0 M0 stage IIB clinical stage  Treatment summary: TCH-Perjeta 6 cycles started 12/31/2014 completed 04/22/2015 (decreased chemotherapy dosage with cycle 2 and discontinued Perjeta for diarrhea) Breast MRI 04/29/2015: Significant interval decrease in size of the mass, continued skin retraction, 4.3 x 2 x 1.8 cm (previously 4.9 x 3.9 x 3.9 cm) Left mastectomy 05/30/2015: CR to chemo, 0/6 LN  Did not need radiation based upon tumor board recommendation  Current treatment:  1. Anastrozole 1 mg daily was supposed to start 07/15/2015 for weak ER positivity (5%) but she has not started because of lack of prescription coverage and [facility] asked her $400 for the prescription. We will try to see whether she can get the medicine cheaper as well as to see if there are any foundation funds to help her pay for it. 2. Herceptin maintenance to complete May 2017 Echocardiogram 07/18/2015: EF 60-65% normal  Osteopenia: Bone density done 07/14/2015: Revealed a T score of -1.6, encouraged her to take calcium and vitamin D  Return to clinic in 6 weeks for follow-up

## 2015-10-07 ENCOUNTER — Encounter: Payer: Self-pay | Admitting: *Deleted

## 2015-10-07 ENCOUNTER — Ambulatory Visit (HOSPITAL_BASED_OUTPATIENT_CLINIC_OR_DEPARTMENT_OTHER): Payer: Medicare Other

## 2015-10-07 ENCOUNTER — Ambulatory Visit (HOSPITAL_BASED_OUTPATIENT_CLINIC_OR_DEPARTMENT_OTHER): Payer: Medicare Other | Admitting: Hematology and Oncology

## 2015-10-07 ENCOUNTER — Encounter: Payer: Self-pay | Admitting: Hematology and Oncology

## 2015-10-07 ENCOUNTER — Telehealth: Payer: Self-pay | Admitting: Hematology and Oncology

## 2015-10-07 ENCOUNTER — Other Ambulatory Visit (HOSPITAL_BASED_OUTPATIENT_CLINIC_OR_DEPARTMENT_OTHER): Payer: Medicare Other

## 2015-10-07 VITALS — BP 139/51 | HR 63 | Temp 98.4°F | Resp 18 | Ht 63.0 in | Wt 202.2 lb

## 2015-10-07 DIAGNOSIS — C50412 Malignant neoplasm of upper-outer quadrant of left female breast: Secondary | ICD-10-CM

## 2015-10-07 DIAGNOSIS — M8580 Other specified disorders of bone density and structure, unspecified site: Secondary | ICD-10-CM

## 2015-10-07 DIAGNOSIS — Z5112 Encounter for antineoplastic immunotherapy: Secondary | ICD-10-CM

## 2015-10-07 LAB — COMPREHENSIVE METABOLIC PANEL
ALBUMIN: 3.8 g/dL (ref 3.5–5.0)
ALK PHOS: 76 U/L (ref 40–150)
ALT: 13 U/L (ref 0–55)
ANION GAP: 14 meq/L — AB (ref 3–11)
AST: 13 U/L (ref 5–34)
BILIRUBIN TOTAL: 0.35 mg/dL (ref 0.20–1.20)
BUN: 24 mg/dL (ref 7.0–26.0)
CALCIUM: 10.2 mg/dL (ref 8.4–10.4)
CO2: 22 meq/L (ref 22–29)
CREATININE: 1.2 mg/dL — AB (ref 0.6–1.1)
Chloride: 105 mEq/L (ref 98–109)
EGFR: 48 mL/min/{1.73_m2} — ABNORMAL LOW (ref >90)
Glucose: 121 mg/dl (ref 70–140)
Potassium: 3.4 mEq/L — ABNORMAL LOW (ref 3.5–5.1)
Sodium: 141 mEq/L (ref 136–145)
TOTAL PROTEIN: 7.6 g/dL (ref 6.4–8.3)

## 2015-10-07 LAB — CBC WITH DIFFERENTIAL/PLATELET
BASO%: 0.5 % (ref 0.0–2.0)
Basophils Absolute: 0 10*3/uL (ref 0.0–0.1)
EOS ABS: 0.2 10*3/uL (ref 0.0–0.5)
EOS%: 3 % (ref 0.0–7.0)
HEMATOCRIT: 37.9 % (ref 34.8–46.6)
HEMOGLOBIN: 12.6 g/dL (ref 11.6–15.9)
LYMPH#: 4 10*3/uL — AB (ref 0.9–3.3)
LYMPH%: 48.6 % (ref 14.0–49.7)
MCH: 32.3 pg (ref 25.1–34.0)
MCHC: 33.3 g/dL (ref 31.5–36.0)
MCV: 97.1 fL (ref 79.5–101.0)
MONO#: 0.6 10*3/uL (ref 0.1–0.9)
MONO%: 7.6 % (ref 0.0–14.0)
NEUT%: 40.3 % (ref 38.4–76.8)
NEUTROS ABS: 3.3 10*3/uL (ref 1.5–6.5)
PLATELETS: 216 10*3/uL (ref 145–400)
RBC: 3.9 10*6/uL (ref 3.70–5.45)
RDW: 13.5 % (ref 11.2–14.5)
WBC: 8.1 10*3/uL (ref 3.9–10.3)

## 2015-10-07 MED ORDER — TRASTUZUMAB CHEMO INJECTION 440 MG
6.0000 mg/kg | Freq: Once | INTRAVENOUS | Status: AC
  Administered 2015-10-07: 567 mg via INTRAVENOUS
  Filled 2015-10-07: qty 27

## 2015-10-07 MED ORDER — HEPARIN SOD (PORK) LOCK FLUSH 100 UNIT/ML IV SOLN
500.0000 [IU] | Freq: Once | INTRAVENOUS | Status: AC | PRN
  Administered 2015-10-07: 500 [IU]
  Filled 2015-10-07: qty 5

## 2015-10-07 MED ORDER — SODIUM CHLORIDE 0.9 % IJ SOLN
10.0000 mL | INTRAMUSCULAR | Status: DC | PRN
  Administered 2015-10-07: 10 mL
  Filled 2015-10-07: qty 10

## 2015-10-07 MED ORDER — ACETAMINOPHEN 325 MG PO TABS
ORAL_TABLET | ORAL | Status: AC
  Filled 2015-10-07: qty 2

## 2015-10-07 MED ORDER — SODIUM CHLORIDE 0.9 % IV SOLN
Freq: Once | INTRAVENOUS | Status: AC
  Administered 2015-10-07: 11:00:00 via INTRAVENOUS

## 2015-10-07 MED ORDER — DIPHENHYDRAMINE HCL 25 MG PO CAPS
ORAL_CAPSULE | ORAL | Status: AC
  Filled 2015-10-07: qty 2

## 2015-10-07 MED ORDER — ACETAMINOPHEN 325 MG PO TABS
650.0000 mg | ORAL_TABLET | Freq: Once | ORAL | Status: AC
  Administered 2015-10-07: 650 mg via ORAL

## 2015-10-07 MED ORDER — DIPHENHYDRAMINE HCL 25 MG PO CAPS
50.0000 mg | ORAL_CAPSULE | Freq: Once | ORAL | Status: AC
  Administered 2015-10-07: 50 mg via ORAL

## 2015-10-07 NOTE — Telephone Encounter (Signed)
Gave patient avs report and appointments for March thru May. Per 2/27 pof lab in 9 weeks and no need of lab on any day prior to 9 weeks.

## 2015-10-07 NOTE — Progress Notes (Signed)
Patient Care Team: [name], MD as PCP - General  DIAGNOSIS: No matching staging information was found for the patient.  SUMMARY OF ONCOLOGIC HISTORY:   Breast cancer of upper-outer quadrant of left female breast ([place])   12/07/2014 Initial Diagnosis Left breast 2:00 position: Invasive ductal carcinoma grade 2, ER 5%, PR 0%, Ki-67 80%, HER-2 positive ratio 6.96   12/07/2014 Mammogram Large left breast mass 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, 2 small benign nodules in the right breast stable since 2002, other consistent with normal intramammary lymph node left axilla ultrasound normal-sized lymph nodes   12/31/2014 - 04/22/2015 Neo-Adjuvant Chemotherapy [facility] Perjeta neoadjuvant chemotherapy 6 (decreased chemotherapy dosage with cycle 2 and discontinued Perjeta for diarrhea)    04/29/2015 Breast MRI Significant interval decrease in size of the mass, continued skin retraction, 4.3 x 2 x 1.8 cm (previously 4.9 x 3.9 x 3.9 cm)   05/30/2015 Surgery Left mastectomy: Path CR, 0/6 LN Negative    CHIEF COMPLIANT:   INTERVAL HISTORY: Savannah Howard is a     REVIEW OF SYSTEMS:   Constitutional: Denies fevers, chills or abnormal weight loss Eyes: Denies blurriness of vision Ears, nose, mouth, throat, and face: Denies mucositis or sore throat Respiratory: Denies cough, dyspnea or wheezes Cardiovascular: Denies palpitation, chest discomfort Gastrointestinal:  Denies nausea, heartburn or change in bowel habits Skin: Denies abnormal skin rashes Lymphatics: Denies new lymphadenopathy or easy bruising Neurological:Denies numbness, tingling or new weaknesses Behavioral/Psych: Mood is stable, no new changes  Extremities: No lower extremity edema Breast:  denies any pain or lumps or nodules in either breasts All other systems were reviewed with the patient and are negative.  I have reviewed the past medical history, past surgical history, social history and family history with the patient and they are  unchanged from previous note.  ALLERGIES:  is allergic to penicillins and sulfonamide derivatives.  MEDICATIONS:  Current Outpatient Prescriptions  Medication Sig Dispense Refill  . acetaminophen (TYLENOL) 500 MG tablet Take 1,000 mg by mouth every 6 (six) hours as needed for mild pain.    Marland Kitchen anastrozole (ARIMIDEX) 1 MG tablet Take 1 tablet (1 mg total) by mouth daily. (Patient not taking: Reported on 07/26/2015) 90 tablet 3  . lidocaine-prilocaine (EMLA) cream APPLY TO AFFECTED AREA ONCE  3  . LORazepam (ATIVAN) 0.5 MG tablet TAKE 1 TABLET BY MOUTH 3 TIMES A DAY AS NEEDED FOR ANXIETY 270 tablet 1  . losartan-hydrochlorothiazide (HYZAAR) 100-25 MG tablet Take 1 tablet by mouth daily.  3  . metoprolol succinate (TOPROL XL) 25 MG 24 hr tablet Take 1 tablet (25 mg total) by mouth daily.    Marland Kitchen omeprazole (PRILOSEC) 20 MG capsule Take 1 capsule (20 mg total) by mouth daily. 90 capsule 1  . potassium chloride (MICRO-K) 10 MEQ CR capsule Take 1 capsule (10 mEq total) by mouth daily. 90 capsule 3  . terazosin (HYTRIN) 5 MG capsule Take 1 capsule (5 mg total) by mouth at bedtime. 90 capsule 3  . vitamin B-12 (CYANOCOBALAMIN) 1000 MCG tablet Take 1,000 mcg by mouth daily.     No current facility-administered medications for this visit.    PHYSICAL EXAMINATION: ECOG PERFORMANCE STATUS: 0 - Asymptomatic  Filed Vitals:   10/07/15 0855  BP: 139/51  Pulse: 63  Temp: 98.4 F (36.9 C)  Resp: 18   Filed Weights   10/07/15 0855  Weight: 202 lb 3.2 oz (91.717 kg)    GENERAL:alert, no distress and comfortable SKIN: skin  color, texture, turgor are normal, no rashes or significant lesions EYES: normal, Conjunctiva are pink and non-injected, sclera clear OROPHARYNX:no exudate, no erythema and lips, buccal mucosa, and tongue normal  NECK: supple, thyroid normal size, non-tender, without nodularity LYMPH:  no palpable lymphadenopathy in the cervical, axillary or inguinal LUNGS: clear to auscultation  and percussion with normal breathing effort HEART: regular rate & rhythm and no murmurs and no lower extremity edema ABDOMEN:abdomen soft, non-tender and normal bowel sounds MUSCULOSKELETAL:no cyanosis of digits and no clubbing  NEURO: alert & oriented x 3 with fluent speech, no focal motor/sensory deficits EXTREMITIES: No lower extremity edema  LABORATORY DATA:  I have reviewed the data as listed   Chemistry      Component Value Date/Time   NA 141 10/07/2015 0843   NA 139 05/22/2015 0825   K 3.4* 10/07/2015 0843   K 3.9 05/22/2015 0825   CL 101 05/22/2015 0825   CO2 22 10/07/2015 0843   CO2 19* 05/22/2015 0825   BUN 24.0 10/07/2015 0843   BUN 16 05/22/2015 0825   CREATININE 1.2* 10/07/2015 0843   CREATININE 1.32* 05/22/2015 0825      Component Value Date/Time   CALCIUM 10.2 10/07/2015 0843   CALCIUM 9.6 05/22/2015 0825   ALKPHOS 76 10/07/2015 0843   ALKPHOS 68 11/05/2014 1456   AST 13 10/07/2015 0843   AST 15 11/05/2014 1456   ALT 13 10/07/2015 0843   ALT 14 11/05/2014 1456   BILITOT 0.35 10/07/2015 0843   BILITOT 0.4 11/05/2014 1456       Lab Results  Component Value Date   WBC 8.1 10/07/2015   HGB 12.6 10/07/2015   HCT 37.9 10/07/2015   MCV 97.1 10/07/2015   PLT 216 10/07/2015   NEUTROABS 3.3 10/07/2015     ASSESSMENT & PLAN:  Breast cancer of upper-outer quadrant of left female breast ([place]) 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, no lymph nodes, grade 2-3, ER 5% PR 0% HER-2 positive ratio 6.96, Ki-67 is 80% T3 N0 M0 stage IIB clinical stage  Treatment summary: TCH-Perjeta 6 cycles started 12/31/2014 completed 04/22/2015 (decreased chemotherapy dosage with cycle 2 and discontinued Perjeta for diarrhea) Breast MRI 04/29/2015: Significant interval decrease in size of the mass, continued skin retraction, 4.3 x 2 x 1.8 cm (previously 4.9 x 3.9 x 3.9 cm) Left mastectomy 05/30/2015: CR to chemo, 0/6 LN  Did not need radiation  based upon tumor board recommendation  Current treatment:  1. Anastrozole 1 mg daily was supposed to start 07/15/2015 for weak ER positivity (5%) but she has not started because of lack of prescription coverage and [facility] asked [facility] $400 for the prescription. We will try to see whether she can get the medicine cheaper as well as to see if there are any foundation funds to help her pay for it. 2. Herceptin maintenance to complete May 2017 Echocardiogram 07/18/2015: EF 60-65% normal  Osteopenia: Bone density done 07/14/2015: Revealed a T score of -1.6, encouraged her to take calcium and vitamin D  Return to clinic in 6 weeks for follow-up   No orders of the defined types were placed in this encounter.   The patient has a good understanding of the overall plan. she agrees with it. she will call with any problems that may develop before the next visit here.   Rulon Eisenmenger, MD 10/07/2015

## 2015-10-07 NOTE — Progress Notes (Signed)
Patient Care Team: [name], MD as PCP - General  SUMMARY OF ONCOLOGIC HISTORY:   Breast cancer of upper-outer quadrant of left female breast ([place])   12/07/2014 Initial Diagnosis Left breast 2:00 position: Invasive ductal carcinoma grade 2, ER 5%, PR 0%, Ki-67 80%, HER-2 positive ratio 6.96   12/07/2014 Mammogram Large left breast mass 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, 2 small benign nodules in the right breast stable since 2002, other consistent with normal intramammary lymph node left axilla ultrasound normal-sized lymph nodes   12/31/2014 - 04/22/2015 Neo-Adjuvant Chemotherapy TCH Perjeta neoadjuvant chemotherapy 6 (decreased chemotherapy dosage with cycle 2 and discontinued Perjeta for diarrhea)    04/29/2015 Breast MRI Significant interval decrease in size of the mass, continued skin retraction, 4.3 x 2 x 1.8 cm (previously 4.9 x 3.9 x 3.9 cm)   05/30/2015 Surgery Left mastectomy: Path CR, 0/6 LN Negative   09/09/2015 -  Anti-estrogen oral therapy Anastrozole 1 mg daily    CHIEF COMPLIANT: Follow-up on anastrozole and Herceptin  INTERVAL HISTORY: Savannah Howard is a 70-year-old with above-mentioned left breast cancer currently on adjuvant therapy with Herceptin maintenance. She is also on anastrozole. She denies any hot flashes or myalgias. Denies any side effects to Herceptin.  REVIEW OF SYSTEMS:   Constitutional: Denies fevers, chills or abnormal weight loss Eyes: Denies blurriness of vision Ears, nose, mouth, throat, and face: Denies mucositis or sore throat Respiratory: Denies cough, dyspnea or wheezes Cardiovascular: Denies palpitation, chest discomfort Gastrointestinal:  Denies nausea, heartburn or change in bowel habits Skin: Denies abnormal skin rashes Lymphatics: Denies new lymphadenopathy or easy bruising Neurological:Denies numbness, tingling or new weaknesses Behavioral/Psych: Mood is stable, no new changes  Extremities: No lower extremity edema Breast:  denies any pain or  lumps or nodules in either breasts All other systems were reviewed with the patient and are negative.  I have reviewed the past medical history, past surgical history, social history and family history with the patient and they are unchanged from previous note.  ALLERGIES:  is allergic to penicillins and sulfonamide derivatives.  MEDICATIONS:  Current Outpatient Prescriptions  Medication Sig Dispense Refill  . acetaminophen (TYLENOL) 500 MG tablet Take 1,000 mg by mouth every 6 (six) hours as needed for mild pain.    Marland Kitchen anastrozole (ARIMIDEX) 1 MG tablet Take 1 tablet (1 mg total) by mouth daily. (Patient not taking: Reported on 07/26/2015) 90 tablet 3  . lidocaine-prilocaine (EMLA) cream APPLY TO AFFECTED AREA ONCE  3  . LORazepam (ATIVAN) 0.5 MG tablet TAKE 1 TABLET BY MOUTH 3 TIMES A DAY AS NEEDED FOR ANXIETY 270 tablet 1  . losartan-hydrochlorothiazide (HYZAAR) 100-25 MG tablet Take 1 tablet by mouth daily.  3  . metoprolol succinate (TOPROL XL) 25 MG 24 hr tablet Take 1 tablet (25 mg total) by mouth daily.    Marland Kitchen omeprazole (PRILOSEC) 20 MG capsule Take 1 capsule (20 mg total) by mouth daily. 90 capsule 1  . potassium chloride (MICRO-K) 10 MEQ CR capsule Take 1 capsule (10 mEq total) by mouth daily. 90 capsule 3  . terazosin (HYTRIN) 5 MG capsule Take 1 capsule (5 mg total) by mouth at bedtime. 90 capsule 3  . vitamin B-12 (CYANOCOBALAMIN) 1000 MCG tablet Take 1,000 mcg by mouth daily.     No current facility-administered medications for this visit.    PHYSICAL EXAMINATION: ECOG PERFORMANCE STATUS: 1 - Symptomatic but completely ambulatory  Filed Vitals:   10/07/15 0855  BP: 139/51  Pulse:  63  Temp: 98.4 F (36.9 C)  Resp: 18   Filed Weights   10/07/15 0855  Weight: 202 lb 3.2 oz (91.717 kg)    GENERAL:alert, no distress and comfortable SKIN: skin color, texture, turgor are normal, no rashes or significant lesions EYES: normal, Conjunctiva are pink and non-injected, sclera  clear OROPHARYNX:no exudate, no erythema and lips, buccal mucosa, and tongue normal  NECK: supple, thyroid normal size, non-tender, without nodularity LYMPH:  no palpable lymphadenopathy in the cervical, axillary or inguinal LUNGS: clear to auscultation and percussion with normal breathing effort HEART: regular rate & rhythm and no murmurs and no lower extremity edema ABDOMEN:abdomen soft, non-tender and normal bowel sounds MUSCULOSKELETAL:no cyanosis of digits and no clubbing  NEURO: alert & oriented x 3 with fluent speech, no focal motor/sensory deficits EXTREMITIES: No lower extremity edema LABORATORY DATA:  I have reviewed the data as listed   Chemistry      Component Value Date/Time   NA 141 10/07/2015 0843   NA 139 05/22/2015 0825   K 3.4* 10/07/2015 0843   K 3.9 05/22/2015 0825   CL 101 05/22/2015 0825   CO2 22 10/07/2015 0843   CO2 19* 05/22/2015 0825   BUN 24.0 10/07/2015 0843   BUN 16 05/22/2015 0825   CREATININE 1.2* 10/07/2015 0843   CREATININE 1.32* 05/22/2015 0825      Component Value Date/Time   CALCIUM 10.2 10/07/2015 0843   CALCIUM 9.6 05/22/2015 0825   ALKPHOS 76 10/07/2015 0843   ALKPHOS 68 11/05/2014 1456   AST 13 10/07/2015 0843   AST 15 11/05/2014 1456   ALT 13 10/07/2015 0843   ALT 14 11/05/2014 1456   BILITOT 0.35 10/07/2015 0843   BILITOT 0.4 11/05/2014 1456       Lab Results  Component Value Date   WBC 8.1 10/07/2015   HGB 12.6 10/07/2015   HCT 37.9 10/07/2015   MCV 97.1 10/07/2015   PLT 216 10/07/2015   NEUTROABS 3.3 10/07/2015     ASSESSMENT & PLAN:  Breast cancer of upper-outer quadrant of left female breast ([place]) 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, no lymph nodes, grade 2-3, ER 5% PR 0% HER-2 positive ratio 6.96, Ki-67 is 80% T3 N0 M0 stage IIB clinical stage  Treatment summary: TCH-Perjeta 6 cycles started 12/31/2014 completed 04/22/2015 (decreased chemotherapy dosage with cycle 2 and  discontinued Perjeta for diarrhea) Breast MRI 04/29/2015: Significant interval decrease in size of the mass, continued skin retraction, 4.3 x 2 x 1.8 cm (previously 4.9 x 3.9 x 3.9 cm) Left mastectomy 05/30/2015: CR to chemo, 0/6 LN  Did not need radiation based upon tumor board recommendation  Current treatment:  1. Anastrozole 1 mg daily was supposed to start 07/15/2015 for weak ER positivity (5%) but she has not started because of lack of prescription coverage and [facility] asked [facility] $400 for the prescription. We will try to see whether she can get the medicine cheaper as well as to see if there are any foundation funds to help her pay for it. 2. Herceptin maintenance to complete May 2017 Echocardiogram 07/18/2015: EF 60-65% normal  Anastrozole toxicities: Denies any hot flashes or myalgias. Osteopenia: Bone density done 07/14/2015: Revealed a T score of -1.6, encouraged her to take calcium and vitamin D  Return to clinic in 9 weeks for follow-up which will be the last Herceptin treatment on 12/09/2015.   No orders of the defined types were placed in this encounter.  The patient has a good understanding of the overall plan. she agrees with it. she will call with any problems that may develop before the next visit here.   Rulon Eisenmenger, MD 10/07/2015

## 2015-10-28 ENCOUNTER — Other Ambulatory Visit: Payer: Medicare Other

## 2015-10-28 ENCOUNTER — Ambulatory Visit (HOSPITAL_BASED_OUTPATIENT_CLINIC_OR_DEPARTMENT_OTHER): Payer: Medicare Other

## 2015-10-28 VITALS — BP 161/55 | HR 57 | Temp 98.2°F | Resp 16

## 2015-10-28 DIAGNOSIS — Z5112 Encounter for antineoplastic immunotherapy: Secondary | ICD-10-CM

## 2015-10-28 DIAGNOSIS — C50412 Malignant neoplasm of upper-outer quadrant of left female breast: Secondary | ICD-10-CM | POA: Diagnosis not present

## 2015-10-28 MED ORDER — SODIUM CHLORIDE 0.9 % IV SOLN
Freq: Once | INTRAVENOUS | Status: AC
  Administered 2015-10-28: 09:00:00 via INTRAVENOUS

## 2015-10-28 MED ORDER — HEPARIN SOD (PORK) LOCK FLUSH 100 UNIT/ML IV SOLN
500.0000 [IU] | Freq: Once | INTRAVENOUS | Status: AC | PRN
  Administered 2015-10-28: 500 [IU]
  Filled 2015-10-28: qty 5

## 2015-10-28 MED ORDER — TRASTUZUMAB CHEMO INJECTION 440 MG
6.0000 mg/kg | Freq: Once | INTRAVENOUS | Status: AC
  Administered 2015-10-28: 567 mg via INTRAVENOUS
  Filled 2015-10-28: qty 27

## 2015-10-28 MED ORDER — ACETAMINOPHEN 325 MG PO TABS
650.0000 mg | ORAL_TABLET | Freq: Once | ORAL | Status: AC
  Administered 2015-10-28: 650 mg via ORAL

## 2015-10-28 MED ORDER — DIPHENHYDRAMINE HCL 25 MG PO CAPS
50.0000 mg | ORAL_CAPSULE | Freq: Once | ORAL | Status: AC
  Administered 2015-10-28: 50 mg via ORAL

## 2015-10-28 MED ORDER — SODIUM CHLORIDE 0.9 % IJ SOLN
10.0000 mL | INTRAMUSCULAR | Status: DC | PRN
  Administered 2015-10-28: 10 mL
  Filled 2015-10-28: qty 10

## 2015-10-28 MED ORDER — DIPHENHYDRAMINE HCL 25 MG PO CAPS
ORAL_CAPSULE | ORAL | Status: AC
  Filled 2015-10-28: qty 2

## 2015-10-28 MED ORDER — ACETAMINOPHEN 325 MG PO TABS
ORAL_TABLET | ORAL | Status: AC
  Filled 2015-10-28: qty 2

## 2015-10-28 NOTE — Progress Notes (Signed)
Per pt, she is working on getting an appointment for an ECHO.  Per pharmacy ok to treat today without ECHO as long as pt is aware she needs one ASAP.

## 2015-10-28 NOTE — Patient Instructions (Signed)
[facility] Discharge Instructions for Patients Receiving Chemotherapy  Today you received the following chemotherapy agents herceptin   To help prevent nausea and vomiting after your treatment, we encourage you to take your nausea medication as directed   If you develop nausea and vomiting that is not controlled by your nausea medication, call the clinic.   BELOW ARE SYMPTOMS THAT SHOULD BE REPORTED IMMEDIATELY:  *FEVER GREATER THAN 100.5 F  *CHILLS WITH OR WITHOUT FEVER  NAUSEA AND VOMITING THAT IS NOT CONTROLLED WITH YOUR NAUSEA MEDICATION  *UNUSUAL SHORTNESS OF BREATH  *UNUSUAL BRUISING OR BLEEDING  TENDERNESS IN MOUTH AND THROAT WITH OR WITHOUT PRESENCE OF ULCERS  *URINARY PROBLEMS  *BOWEL PROBLEMS  UNUSUAL RASH Items with * indicate a potential emergency and should be followed up as soon as possible.  Feel free to call the clinic you have any questions or concerns. The clinic phone number is [phone].  

## 2015-10-31 ENCOUNTER — Telehealth (HOSPITAL_COMMUNITY): Payer: Self-pay | Admitting: Vascular Surgery

## 2015-10-31 NOTE — Telephone Encounter (Signed)
Left pt message to make f/u appt w/ ECHO 

## 2015-11-05 ENCOUNTER — Ambulatory Visit (HOSPITAL_COMMUNITY)
Admission: RE | Admit: 2015-11-05 | Discharge: 2015-11-05 | Disposition: A | Discharge status: Home / Self Care | Payer: Medicare Other | Source: Ambulatory Visit | Attending: Internal Medicine | Admitting: Internal Medicine

## 2015-11-05 DIAGNOSIS — C50412 Malignant neoplasm of upper-outer quadrant of left female breast: Secondary | ICD-10-CM | POA: Diagnosis not present

## 2015-11-05 DIAGNOSIS — I10 Essential (primary) hypertension: Secondary | ICD-10-CM

## 2015-11-05 DIAGNOSIS — I059 Rheumatic mitral valve disease, unspecified: Secondary | ICD-10-CM | POA: Insufficient documentation

## 2015-11-05 DIAGNOSIS — I119 Hypertensive heart disease without heart failure: Secondary | ICD-10-CM | POA: Insufficient documentation

## 2015-11-05 NOTE — Progress Notes (Signed)
  Echocardiogram 2D Echocardiogram has been performed.  Jennette Dubin 11/05/2015, 11:24 AM

## 2015-11-11 ENCOUNTER — Encounter: Payer: Self-pay | Admitting: Hematology and Oncology

## 2015-11-11 NOTE — Progress Notes (Signed)
[employer] form nurse TF mailed 04/24/15 I sent to medical records

## 2015-11-12 ENCOUNTER — Telehealth (HOSPITAL_COMMUNITY): Payer: Self-pay | Admitting: Vascular Surgery

## 2015-11-12 ENCOUNTER — Inpatient Hospital Stay (HOSPITAL_COMMUNITY): Admission: RE | Admit: 2015-11-12 | Payer: Medicare Other | Source: Ambulatory Visit | Admitting: Internal Medicine

## 2015-11-12 NOTE — Telephone Encounter (Signed)
Left pt message to reschedule appt w/ db due to hospital lock down

## 2015-11-18 ENCOUNTER — Ambulatory Visit: Payer: Medicare Other

## 2015-11-18 ENCOUNTER — Telehealth: Payer: Self-pay | Admitting: Hematology and Oncology

## 2015-11-18 ENCOUNTER — Other Ambulatory Visit: Payer: Self-pay | Admitting: *Deleted

## 2015-11-18 NOTE — Telephone Encounter (Signed)
cld pt and left message to call and r/s missed herceptin appt this week

## 2015-11-19 ENCOUNTER — Ambulatory Visit (HOSPITAL_COMMUNITY)
Admission: RE | Admit: 2015-11-19 | Discharge: 2015-11-19 | Disposition: A | Discharge status: Home / Self Care | Payer: Medicare Other | Source: Ambulatory Visit | Attending: Internal Medicine | Admitting: Internal Medicine

## 2015-11-19 VITALS — BP 142/78 | HR 65 | Wt 203.5 lb

## 2015-11-19 DIAGNOSIS — C50412 Malignant neoplasm of upper-outer quadrant of left female breast: Secondary | ICD-10-CM | POA: Diagnosis not present

## 2015-11-19 DIAGNOSIS — I10 Essential (primary) hypertension: Secondary | ICD-10-CM | POA: Insufficient documentation

## 2015-11-19 DIAGNOSIS — K219 Gastro-esophageal reflux disease without esophagitis: Secondary | ICD-10-CM | POA: Diagnosis not present

## 2015-11-19 DIAGNOSIS — Z880 Allergy status to penicillin: Secondary | ICD-10-CM | POA: Insufficient documentation

## 2015-11-19 DIAGNOSIS — Z9221 Personal history of antineoplastic chemotherapy: Secondary | ICD-10-CM | POA: Insufficient documentation

## 2015-11-19 DIAGNOSIS — Z79899 Other long term (current) drug therapy: Secondary | ICD-10-CM | POA: Diagnosis not present

## 2015-11-19 DIAGNOSIS — E669 Obesity, unspecified: Secondary | ICD-10-CM | POA: Diagnosis not present

## 2015-11-19 DIAGNOSIS — E538 Deficiency of other specified B group vitamins: Secondary | ICD-10-CM | POA: Diagnosis not present

## 2015-11-19 DIAGNOSIS — Z8249 Family history of ischemic heart disease and other diseases of the circulatory system: Secondary | ICD-10-CM | POA: Insufficient documentation

## 2015-11-19 DIAGNOSIS — Z882 Allergy status to sulfonamides status: Secondary | ICD-10-CM | POA: Diagnosis not present

## 2015-11-19 DIAGNOSIS — Z9012 Acquired absence of left breast and nipple: Secondary | ICD-10-CM | POA: Insufficient documentation

## 2015-11-19 DIAGNOSIS — Z825 Family history of asthma and other chronic lower respiratory diseases: Secondary | ICD-10-CM | POA: Insufficient documentation

## 2015-11-19 DIAGNOSIS — E559 Vitamin D deficiency, unspecified: Secondary | ICD-10-CM | POA: Insufficient documentation

## 2015-11-19 MED ORDER — METOPROLOL SUCCINATE ER 25 MG PO TB24: 25.0000 mg | ORAL_TABLET | Freq: Every day | ORAL | Status: DC

## 2015-11-19 NOTE — Progress Notes (Signed)
Patient ID: Savannah Howard, female   DOB: May 28, 1946, 70 y.o.   MRN: [id number]   Cardio-Oncology CLINIC CONSULT NOTE  Oncologist/referring: Lindi Adie   HPI:  Savannah Howard is a 70 y/o woman with h/o HTN, obesity and left breast CA s/p TCH chemotherapy and left mastectomy referred by Dr. Lindi Adie for enrollment into the cardio-oncology program.   Breast cancer hisory as below:    Breast cancer of upper-outer quadrant of left female breast ([place])   12/07/2014 Initial Diagnosis Left breast 2:00 position: Invasive ductal carcinoma grade 2, ER 5%, PR 0%, Ki-67 80%, HER-2 positive ratio 6.96   12/07/2014 Mammogram Large left breast mass 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, 2 small benign nodules in the right breast stable since 2002, other consistent with normal intramammary lymph node left axilla ultrasound normal-sized lymph nodes   12/31/2014 - 04/22/2015 Neo-Adjuvant Chemotherapy TCH Perjeta neoadjuvant chemotherapy 6 (decreased chemotherapy dosage with cycle 2 and discontinued Perjeta for diarrhea)    04/29/2015 Breast MRI Significant interval decrease in size of the mass, continued skin retraction, 4.3 x 2 x 1.8 cm (previously 4.9 x 3.9 x 3.9 cm)   05/30/2015 Surgery Left mastectomy: Path CR, 0/6 LN Negative       Denies h/o known heart disease. About 15 years ago had stress test for CP in setting of HTN crisis. Stress test was normal.   Doing well. Started chemo in May 2016. Taking herceptin q 3weeks. No problems with therapy. Has two more doses. Complaining of tremors and neuropathy.  No edema, orthopnea or PND.   Echo 12/16: EF 60-65% Lat s' 10.3 GLS - 21.1% + aortic sclerosis (no stenosis) Echo 3/17: EF 60% Lat s' 10.7 GLS - 22.2%    Past Medical History  Diagnosis Date  . GERD (gastroesophageal reflux disease)   . Hypertension   . Obesity   . Vitamin B 12 deficiency   . Vitamin D deficiency   . Anxiety   . Allergy     rhinitis  . Kidney stone 1990's X 1    "passed it"  .  History of blood transfusion 2016 X 3 (05/30/2015)  . Heart murmur   . Anemia   . DDD (degenerative disc disease), lumbar dx'd in 1980's  . Primary cancer of upper outer quadrant of left breast ([place]) dx'd 03/2015    Current Outpatient Prescriptions  Medication Sig Dispense Refill  . anastrozole (ARIMIDEX) 1 MG tablet Take 1 tablet (1 mg total) by mouth daily. 90 tablet 3  . lidocaine-prilocaine (EMLA) cream APPLY TO AFFECTED AREA ONCE  3  . LORazepam (ATIVAN) 0.5 MG tablet TAKE 1 TABLET BY MOUTH 3 TIMES A DAY AS NEEDED FOR ANXIETY 270 tablet 1  . losartan-hydrochlorothiazide (HYZAAR) 100-25 MG tablet Take 1 tablet by mouth daily.  3  . metoprolol succinate (TOPROL XL) 25 MG 24 hr tablet Take 1 tablet (25 mg total) by mouth daily.    Marland Kitchen omeprazole (PRILOSEC) 20 MG capsule Take 1 capsule (20 mg total) by mouth daily. 90 capsule 1  . potassium chloride (MICRO-K) 10 MEQ CR capsule Take 1 capsule (10 mEq total) by mouth daily. 90 capsule 3  . terazosin (HYTRIN) 5 MG capsule Take 1 capsule (5 mg total) by mouth at bedtime. 90 capsule 3  . vitamin B-12 (CYANOCOBALAMIN) 1000 MCG tablet Take 1,000 mcg by mouth daily.    Marland Kitchen acetaminophen (TYLENOL) 500 MG tablet Take 1,000 mg by mouth every 6 (six) hours as needed for mild pain.  No current facility-administered medications for this encounter.    Allergies  Allergen Reactions  . Penicillins     REACTION: genital swelling and skin peeled ? yeast infection  . Sulfonamide Derivatives       Social History   Social History  . Marital Status: Single    Spouse Name: N/A  . Number of Children: N/A  . Years of Education: N/A   Occupational History  . [employer]    Social History Main Topics  . Smoking status: Never Smoker   . Smokeless tobacco: Never Used  . Alcohol Use: Yes     Comment: 05/30/2015 "I might take a drink of wine 1-2 times/yr"  . Drug Use: No  . Sexual Activity: Not Currently   Other Topics Concern  . Not on file    Social History Narrative   Regular exercise- no      Family History  Problem Relation Age of Onset  . Hypertension Other   . Heart disease Mother     CAD  . COPD Mother   . Heart disease Father 45    leaky valve  . Parkinsonism Father     Filed Vitals:   11/19/15 0943  BP: 142/78  Pulse: 65  Weight: 203 lb 8 oz (92.307 kg)  SpO2: 98%    PHYSICAL EXAM: General:  Well appearing. No respiratory difficulty HEENT: normal  Neck: supple. no JVD. + RIJ portacath Carotids 2+ bilat; no bruits. No lymphadenopathy or thryomegaly appreciated. Cor: PMI nondisplaced. Regular rate & rhythm. No rubs, gallops. 2/6 SEM RUSB Lungs: clear Abdomen: obese soft, nontender, nondistended. No hepatosplenomegaly. No bruits or masses. Good bowel sounds. Extremities: no cyanosis, clubbing, rash, edema Neuro: alert & oriented x 3, cranial nerves grossly intact. moves all 4 extremities w/o difficulty. Affect pleasant.   ASSESSMENT & PLAN: 1. Breast CA 2. HTN  3. Obesity  I reviewed echos personally. EF and Doppler parameters stable. No HF on exam. Continue Herceptin. With only 2 doses of Herceptin remaining and no evidence of cardiotoxicity to date we will follow up PRN and not repeat echo after last cycle in 6 weeks.    Bensimhon, Daniel,MD 10:04 AM

## 2015-11-19 NOTE — Patient Instructions (Signed)
Follow up as needed

## 2015-11-19 NOTE — Progress Notes (Signed)
Advanced Heart Failure Medication Review by a Pharmacist  Does the patient  feel that his/her medications are working for him/her?  yes  Has the patient been experiencing any side effects to the medications prescribed?  Yes, tremors and neuropathy from chemotherapy  Does the patient measure his/her own blood pressure or blood glucose at home?  Only when symptomatic   Does the patient have any problems obtaining medications due to transportation or finances?   no  Understanding of regimen: excellent Understanding of indications: excellent Potential of compliance: good Patient understands to avoid NSAIDs. Patient understands to avoid decongestants.  Issues to address at subsequent visits: none   Pharmacist comments: Savannah Howard is a very pleasant 70 yo woman here for f/u. She has no issues or changes to medication regimen. Good adherence, uses pill box. Does not measure BP regularly, only when feels symptomatic. Last time measured was 3 weeks ago by patient. Has only 2 cycles left of chemotherapy. Denies hot flashes, flushing, or GI symptoms. Does have tremors and neuropathy from treatment. Tremors better managed in right hand than left. Savannah Howard practices motor skills on computer and has since seen improvement in hand tremors. She looks forward to living life and being more active after chemotherapy treatment as finished.   Savannah Howard, [place].D., BCPS PGY2 Cardiology Pharmacy Resident Pager: [phone]   Time with patient: 5 min Preparation and documentation time: 5 min Total time: 10 min

## 2015-11-21 ENCOUNTER — Telehealth: Payer: Self-pay | Admitting: *Deleted

## 2015-11-21 NOTE — Telephone Encounter (Signed)
Left vm for pt to return call to discuss scheduling herceptin. Contact information provided.

## 2015-11-24 NOTE — Anesthesia Postprocedure Evaluation (Signed)
Anesthesia Post Note  Patient: Savannah KOSAKOWSKI  Procedure(s) Performed: Procedure(s) (LRB): LEFT MASTECTOMY WITH SENTINEL LYMPH NODE BIOPSY (Left)  Patient location during evaluation: PACU Anesthesia Type: General Level of consciousness: awake and awake and alert Pain management: pain level controlled Vital Signs Assessment: post-procedure vital signs reviewed and stable Respiratory status: spontaneous breathing, respiratory function stable, nonlabored ventilation and patient connected to nasal cannula oxygen Cardiovascular status: blood pressure returned to baseline Anesthetic complications: no    Last Vitals:  Filed Vitals:   05/31/15 0210 05/31/15 0519  BP: 132/63 145/85  Pulse: 76 73  Temp: 36.6 C 36.6 C  Resp: 17 17    Last Pain:  Filed Vitals:   05/31/15 0841  PainSc: 2                  Skyra Crichlow COKER

## 2015-12-09 ENCOUNTER — Other Ambulatory Visit: Payer: Medicare Other

## 2015-12-09 ENCOUNTER — Ambulatory Visit: Payer: Medicare Other

## 2015-12-09 ENCOUNTER — Ambulatory Visit: Payer: Medicare Other | Admitting: Hematology and Oncology

## 2015-12-09 ENCOUNTER — Telehealth: Payer: Self-pay | Admitting: *Deleted

## 2015-12-09 ENCOUNTER — Other Ambulatory Visit: Payer: Self-pay | Admitting: *Deleted

## 2015-12-09 NOTE — Assessment & Plan Note (Deleted)
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, no lymph nodes, grade 2-3, ER 5% PR 0% HER-2 positive ratio 6.96, Ki-67 is 80% T3 N0 M0 stage IIB clinical stage  Treatment summary: TCH-Perjeta 6 cycles started 12/31/2014 completed 04/22/2015 (decreased chemotherapy dosage with cycle 2 and discontinued Perjeta for diarrhea) Breast MRI 04/29/2015: Significant interval decrease in size of the mass, continued skin retraction, 4.3 x 2 x 1.8 cm (previously 4.9 x 3.9 x 3.9 cm) Left mastectomy 05/30/2015: CR to chemo, 0/6 LN  Did not need radiation based upon tumor board recommendation  Current treatment:  1. Anastrozole 1 mg daily was supposed to start 07/15/2015 for weak ER positivity (5%) but she has not started because of lack of prescription coverage and [facility] asked [facility] $[facility] for the prescription. We will try to see whether she can get the medicine cheaper as well as to see if there are any foundation funds to help her pay for it. 2. Herceptin maintenance to complete May 2017 Echocardiogram 07/18/2015: EF 60-65% normal  Osteopenia: Bone density done 07/14/2015: Revealed a T score of -1.6, encouraged her to take calcium and vitamin D  Return to clinic 6 months for follow up

## 2015-12-09 NOTE — Telephone Encounter (Signed)
Left vm for pt to return call concerning appts for VG and herceptin. Contact information provided.

## 2015-12-27 ENCOUNTER — Telehealth: Payer: Self-pay | Admitting: *Deleted

## 2015-12-27 DIAGNOSIS — C50412 Malignant neoplasm of upper-outer quadrant of left female breast: Secondary | ICD-10-CM | POA: Diagnosis not present

## 2015-12-27 NOTE — Telephone Encounter (Signed)
Left vm for pt to return call to schedule f/u with Dr. Lindi Adie and herceptin. Contact information provided.

## 2016-01-09 ENCOUNTER — Encounter: Payer: Self-pay | Admitting: *Deleted

## 2016-01-10 ENCOUNTER — Telehealth: Payer: Self-pay | Admitting: Hematology and Oncology

## 2016-01-10 NOTE — Telephone Encounter (Signed)
lvm to inform patient of r/s missed appt for 6/9 at 10 am

## 2016-01-16 NOTE — Assessment & Plan Note (Signed)
  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, no lymph nodes, grade 2-3, ER 5% PR 0% HER-2 positive ratio 6.96, Ki-67 is 80% T3 N0 M0 stage IIB clinical stage  Treatment summary: TCH-Perjeta 6 cycles started 12/31/2014 completed 04/22/2015 (decreased chemotherapy dosage with cycle 2 and discontinued Perjeta for diarrhea) Breast MRI 04/29/2015: Significant interval decrease in size of the mass, continued skin retraction, 4.3 x 2 x 1.8 cm (previously 4.9 x 3.9 x 3.9 cm) Left mastectomy 05/30/2015: CR to chemo, 0/6 LN  Did not need radiation based upon tumor board recommendation Herceptin maintenance completed May 2017  Current treatment:  Anastrozole 1 mg daily was supposed to start 07/15/2015 for weak ER positivity (5%) but she has not started because of lack of prescription coverage and [facility] asked her $400 for the prescription. We will try to see whether she can get the medicine cheaper as well as to see if there are any foundation funds to help her pay for it.  Echocardiogram 07/18/2015: EF 60-65% normal  Osteopenia: Bone density done 07/14/2015: Revealed a T score of -1.6, encouraged her to take calcium and vitamin D  Return to clinic

## 2016-01-17 ENCOUNTER — Telehealth: Payer: Self-pay

## 2016-01-17 ENCOUNTER — Ambulatory Visit: Payer: Medicare Other | Admitting: Hematology and Oncology

## 2016-01-17 ENCOUNTER — Ambulatory Visit: Payer: Medicare Other

## 2016-01-17 ENCOUNTER — Other Ambulatory Visit: Payer: Medicare Other

## 2016-01-17 NOTE — Telephone Encounter (Signed)
Called pt regarding missed appointment today.  In looking back, pt has missed several appointments which include her Herceptin treatments.  Dr. Lindi Adie notified.  Dawn, RN notified and she states she has also made several attempts to contact pt and reschedule but always has to leave messages.  I was unable to reach pt; however, I left VM stating I was checking on pt in regards to missed appointments and further plan of care.  I asked pt to call us back to either reschedule or let us know if she wishes to change her plan of care.

## 2016-01-20 ENCOUNTER — Ambulatory Visit: Payer: Medicare Other | Admitting: Hematology and Oncology

## 2016-01-20 ENCOUNTER — Other Ambulatory Visit: Payer: Medicare Other

## 2016-02-19 DIAGNOSIS — H2513 Age-related nuclear cataract, bilateral: Secondary | ICD-10-CM | POA: Diagnosis not present

## 2016-02-19 DIAGNOSIS — H43813 Vitreous degeneration, bilateral: Secondary | ICD-10-CM | POA: Diagnosis not present

## 2016-03-18 ENCOUNTER — Other Ambulatory Visit: Payer: Self-pay | Admitting: Nurse Practitioner

## 2016-03-28 ENCOUNTER — Other Ambulatory Visit: Payer: Self-pay | Admitting: Internal Medicine

## 2016-03-28 ENCOUNTER — Other Ambulatory Visit: Payer: Self-pay | Admitting: Hematology and Oncology

## 2016-03-30 ENCOUNTER — Telehealth: Payer: Self-pay | Admitting: Hematology and Oncology

## 2016-03-30 NOTE — Progress Notes (Signed)
Received refill request from pt for Hytrin.  Upon chart review, pt has not been seen since February and was due for 6 week follow up as well as other various appointments which have been missed.  I have personally tried to contact pt to follow up as Herceptin treatments were also missed; however, have never heard back or been able to reach pt.  One month prescription entered and message to schedule to attempt to schedule follow up appointment within one month entered.  Per Bary Castilla, RN she has also made several attempts to contact pt with no success.

## 2016-03-30 NOTE — Telephone Encounter (Signed)
Pt voicemail not set up. Mailed pt a calender of upcoming appt per LOS

## 2016-03-31 NOTE — Telephone Encounter (Signed)
sch OV Thx 

## 2016-04-01 NOTE — Telephone Encounter (Signed)
[facility]ed [facility] had to leave on pharmacy vm../lmb 

## 2016-04-24 ENCOUNTER — Ambulatory Visit (HOSPITAL_BASED_OUTPATIENT_CLINIC_OR_DEPARTMENT_OTHER): Payer: Medicare Other | Admitting: Hematology and Oncology

## 2016-04-24 ENCOUNTER — Other Ambulatory Visit: Payer: Self-pay | Admitting: *Deleted

## 2016-04-24 ENCOUNTER — Other Ambulatory Visit: Payer: Self-pay | Admitting: Hematology and Oncology

## 2016-04-24 ENCOUNTER — Encounter: Payer: Self-pay | Admitting: Hematology and Oncology

## 2016-04-24 DIAGNOSIS — C50412 Malignant neoplasm of upper-outer quadrant of left female breast: Secondary | ICD-10-CM

## 2016-04-24 DIAGNOSIS — M8580 Other specified disorders of bone density and structure, unspecified site: Secondary | ICD-10-CM

## 2016-04-24 DIAGNOSIS — Z1231 Encounter for screening mammogram for malignant neoplasm of breast: Secondary | ICD-10-CM

## 2016-04-24 DIAGNOSIS — Z9012 Acquired absence of left breast and nipple: Secondary | ICD-10-CM

## 2016-04-24 NOTE — Assessment & Plan Note (Signed)
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, no lymph nodes, grade 2-3, ER 5% PR 0% HER-2 positive ratio 6.96, Ki-67 is 80% T3 N0 M0 stage IIB clinical stage  Treatment summary: TCH-Perjeta 6 cycles started 12/31/2014 completed 04/22/2015 (decreased chemotherapy dosage with cycle 2 and discontinued Perjeta for diarrhea) Breast MRI 04/29/2015: Significant interval decrease in size of the mass, continued skin retraction, 4.3 x 2 x 1.8 cm (previously 4.9 x 3.9 x 3.9 cm) Left mastectomy 05/30/2015: CR to chemo, 0/6 LN  Did not need radiation based upon tumor board recommendation Herceptin maintenance completed May 2017 Echocardiogram 07/18/2015: EF 60-65% normal  Current treatment:  1. Anastrozole 1 mg daily was supposed to start 07/15/2015 for weak ER positivity (5%) but she has not started because of lack of prescription coverage and [facility] asked [facility] $400 for the prescription. We will try to see whether she can get the medicine cheaper as well as to see if there are any foundation funds to help her pay for it.  Osteopenia: Bone density done 07/14/2015: Revealed a T score of -1.6, encouraged her to take calcium and vitamin D  Return to clinic in 6 months for follow-up

## 2016-04-24 NOTE — Progress Notes (Signed)
Patient Care Team: [name], MD as PCP - General  SUMMARY OF ONCOLOGIC HISTORY:   Breast cancer of upper-outer quadrant of left female breast ([name])   12/07/2014 Initial Diagnosis    Left breast 2:00 position: Invasive ductal carcinoma grade 2, ER 5%, PR 0%, Ki-67 80%, HER-2 positive ratio 6.96      12/07/2014 Mammogram    Large left breast mass 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, 2 small benign nodules in the right breast stable since 2002, other consistent with normal intramammary lymph node left axilla ultrasound normal-sized lymph nodes      12/31/2014 - 04/22/2015 Neo-Adjuvant Chemotherapy    TCH Perjeta neoadjuvant chemotherapy 6 (decreased chemotherapy dosage with cycle 2 and discontinued Perjeta for diarrhea)       04/29/2015 Breast MRI    Significant interval decrease in size of the mass, continued skin retraction, 4.3 x 2 x 1.8 cm (previously 4.9 x 3.9 x 3.9 cm)      05/30/2015 Surgery    Left mastectomy: Path CR, 0/6 LN Negative      09/09/2015 -  Anti-estrogen oral therapy    Anastrozole 1 mg daily       CHIEF COMPLIANT: Follow-up on anastrozole  INTERVAL HISTORY: Savannah Howard is a 70 year old with above-mentioned history of left breast cancer who received neoadjuvant chemotherapy followed by mastectomy. She missed a few doses of adjuvant Herceptin because of multiple issues happening in her life. She had multiple problems with her half siblings as well as close family members died and she was lost to follow-up briefly. However she has been taking anastrozole regularly. She denies any hot flashes or myalgias. She does have mild intermittent aches and pains in her fingers. She thinks that she has finally managed to get better from her interpersonal relationships and is now looking forward to setting new goals in her life.  REVIEW OF SYSTEMS:   Constitutional: Denies fevers, chills or abnormal weight loss Eyes: Denies blurriness of vision Ears, nose, mouth, throat, and  face: Denies mucositis or sore throat Respiratory: Denies cough, dyspnea or wheezes Cardiovascular: Denies palpitation, chest discomfort Gastrointestinal:  Denies nausea, heartburn or change in bowel habits Skin: Denies abnormal skin rashes Lymphatics: Denies new lymphadenopathy or easy bruising Neurological:Denies numbness, tingling or new weaknesses Behavioral/Psych: Mood is stable, no new changes  Extremities: No lower extremity edema Breast:  denies any pain or lumps or nodules in either breasts All other systems were reviewed with the patient and are negative.  I have reviewed the past medical history, past surgical history, social history and family history with the patient and they are unchanged from previous note.  ALLERGIES:  is allergic to penicillins and sulfonamide derivatives.  MEDICATIONS:  Current Outpatient Prescriptions  Medication Sig Dispense Refill  . acetaminophen (TYLENOL) 500 MG tablet Take 1,000 mg by mouth every 6 (six) hours as needed for mild pain.    Marland Kitchen anastrozole (ARIMIDEX) 1 MG tablet Take 1 tablet (1 mg total) by mouth daily. 90 tablet 3  . cyanocobalamin (,VITAMIN B-12,) 1000 MCG/ML injection INJECT 1 MILLILITER INTRAMUSCULARLY EVERY 30 DAYS 1 mL 0  . lidocaine-prilocaine (EMLA) cream APPLY TO AFFECTED AREA ONCE  3  . LORazepam (ATIVAN) 0.5 MG tablet TAKE 1 TABLET BY MOUTH 3 TIMES A DAY AS NEEDED FOR ANXIETY 270 tablet 0  . losartan-hydrochlorothiazide (HYZAAR) 100-25 MG tablet TAKE 1 TABLET BY MOUTH EVERY DAY 90 tablet 0  . metoprolol succinate (TOPROL XL) 25 MG 24 hr tablet Take  1 tablet (25 mg total) by mouth daily. 30 tablet 1  . omeprazole (PRILOSEC) 20 MG capsule Take 1 capsule (20 mg total) by mouth daily. 90 capsule 1  . potassium chloride (MICRO-K) 10 MEQ CR capsule TAKE ONE CAPSULE BY MOUTH EVERY DAY 90 capsule 0  . terazosin (HYTRIN) 5 MG capsule TAKE 1 CAPSULE (5 MG TOTAL) BY MOUTH AT BEDTIME. 30 capsule 1  . vitamin B-12 (CYANOCOBALAMIN) 1000  MCG tablet Take 1,000 mcg by mouth daily.     No current facility-administered medications for this visit.     PHYSICAL EXAMINATION: ECOG PERFORMANCE STATUS: 1 - Symptomatic but completely ambulatory  Vitals:   04/24/16 0856  BP: (!) 168/85  Pulse: (!) 58  Resp: 18  Temp: 98.2 F (36.8 C)   Filed Weights   04/24/16 0856  Weight: 202 lb (91.6 kg)    GENERAL:alert, no distress and comfortable SKIN: skin color, texture, turgor are normal, no rashes or significant lesions EYES: normal, Conjunctiva are pink and non-injected, sclera clear OROPHARYNX:no exudate, no erythema and lips, buccal mucosa, and tongue normal  NECK: supple, thyroid normal size, non-tender, without nodularity LYMPH:  no palpable lymphadenopathy in the cervical, axillary or inguinal LUNGS: clear to auscultation and percussion with normal breathing effort HEART: regular rate & rhythm and no murmurs and no lower extremity edema ABDOMEN:abdomen soft, non-tender and normal bowel sounds MUSCULOSKELETAL:no cyanosis of digits and no clubbing  NEURO: alert & oriented x 3 with fluent speech, no focal motor/sensory deficits EXTREMITIES: No lower extremity edema BREAST: No palpable masses or nodules in either right or left breasts. No palpable axillary supraclavicular or infraclavicular adenopathy no breast tenderness or nipple discharge. (exam performed in the presence of a chaperone)  LABORATORY DATA:  I have reviewed the data as listed   Chemistry      Component Value Date/Time   NA 141 10/07/2015 0843   K 3.4 (L) 10/07/2015 0843   CL 101 05/22/2015 0825   CO2 22 10/07/2015 0843   BUN 24.0 10/07/2015 0843   CREATININE 1.2 (H) 10/07/2015 0843      Component Value Date/Time   CALCIUM 10.2 10/07/2015 0843   ALKPHOS 76 10/07/2015 0843   AST 13 10/07/2015 0843   ALT 13 10/07/2015 0843   BILITOT 0.35 10/07/2015 0843       Lab Results  Component Value Date   WBC 8.1 10/07/2015   HGB 12.6 10/07/2015   HCT  37.9 10/07/2015   MCV 97.1 10/07/2015   PLT 216 10/07/2015   NEUTROABS 3.3 10/07/2015     ASSESSMENT & PLAN:  Breast cancer of upper-outer quadrant of left female breast ([place]) 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, no lymph nodes, grade 2-3, ER 5% PR 0% HER-2 positive ratio 6.96, Ki-67 is 80% T3 N0 M0 stage IIB clinical stage  Treatment summary: TCH-Perjeta 6 cycles started 12/31/2014 completed 04/22/2015 (decreased chemotherapy dosage with cycle 2 and discontinued Perjeta for diarrhea) Breast MRI 04/29/2015: Significant interval decrease in size of the mass, continued skin retraction, 4.3 x 2 x 1.8 cm (previously 4.9 x 3.9 x 3.9 cm) Left mastectomy 05/30/2015: CR to chemo, 0/6 LN  Did not need radiation based upon tumor board recommendation Herceptin maintenance completed May 2017 Echocardiogram 07/18/2015: EF 60-65% normal  Current treatment:  1. Anastrozole 1 mg daily was supposed to start 07/15/2015 for weak ER positivity (5%) but she has not started because of lack of prescription coverage and [facility] asked  her $400 for the prescription. We will try to see whether she can get the medicine cheaper as well as to see if there are any foundation funds to help her pay for it.  Osteopenia: Bone density done 07/14/2015: Revealed a T score of -1.6, encouraged her to take calcium and vitamin D  Return to clinic in 6 months for follow-up   Orders Placed This Encounter  Procedures  . MM DIAG BREAST TOMO UNI RIGHT    Standing Status:   Future    Standing Expiration Date:   06/24/2017    Order Specific Question:   Reason for Exam (SYMPTOM  OR DIAGNOSIS REQUIRED)    Answer:   Rt mammogram annual exam    Order Specific Question:   Preferred imaging location?    Answer:   [facility]   The patient has a good understanding of the overall plan. she agrees with it. she will call with any problems that may develop before the next visit here.   Rulon Eisenmenger, MD 04/24/16

## 2016-05-01 ENCOUNTER — Ambulatory Visit
Admission: RE | Admit: 2016-05-01 | Discharge: 2016-05-01 | Disposition: A | Discharge status: Home / Self Care | Payer: Medicare Other | Source: Ambulatory Visit | Attending: Hematology and Oncology | Admitting: Hematology and Oncology

## 2016-05-01 DIAGNOSIS — Z1231 Encounter for screening mammogram for malignant neoplasm of breast: Secondary | ICD-10-CM | POA: Diagnosis not present

## 2016-05-01 DIAGNOSIS — Z9012 Acquired absence of left breast and nipple: Secondary | ICD-10-CM

## 2016-06-10 DIAGNOSIS — C719 Malignant neoplasm of brain, unspecified: Secondary | ICD-10-CM

## 2016-06-10 HISTORY — DX: Malignant neoplasm of brain, unspecified: C71.9

## 2016-07-09 ENCOUNTER — Emergency Department (HOSPITAL_COMMUNITY): Payer: Medicare Other

## 2016-07-09 ENCOUNTER — Encounter (HOSPITAL_COMMUNITY): Payer: Self-pay

## 2016-07-09 ENCOUNTER — Inpatient Hospital Stay (HOSPITAL_COMMUNITY)
Admission: EM | Admit: 2016-07-09 | Discharge: 2016-07-16 | DRG: 025 | Disposition: A | Discharge status: Skilled Nursing Facility | Payer: Medicare Other | Attending: Internal Medicine | Admitting: Internal Medicine

## 2016-07-09 DIAGNOSIS — Z9012 Acquired absence of left breast and nipple: Secondary | ICD-10-CM

## 2016-07-09 DIAGNOSIS — C7931 Secondary malignant neoplasm of brain: Secondary | ICD-10-CM | POA: Diagnosis not present

## 2016-07-09 DIAGNOSIS — N3000 Acute cystitis without hematuria: Secondary | ICD-10-CM | POA: Diagnosis present

## 2016-07-09 DIAGNOSIS — S3992XA Unspecified injury of lower back, initial encounter: Secondary | ICD-10-CM | POA: Diagnosis not present

## 2016-07-09 DIAGNOSIS — R55 Syncope and collapse: Secondary | ICD-10-CM | POA: Diagnosis present

## 2016-07-09 DIAGNOSIS — N183 Chronic kidney disease, stage 3 (moderate): Secondary | ICD-10-CM | POA: Diagnosis present

## 2016-07-09 DIAGNOSIS — K219 Gastro-esophageal reflux disease without esophagitis: Secondary | ICD-10-CM | POA: Diagnosis present

## 2016-07-09 DIAGNOSIS — A419 Sepsis, unspecified organism: Secondary | ICD-10-CM | POA: Diagnosis present

## 2016-07-09 DIAGNOSIS — E785 Hyperlipidemia, unspecified: Secondary | ICD-10-CM | POA: Diagnosis present

## 2016-07-09 DIAGNOSIS — R350 Frequency of micturition: Secondary | ICD-10-CM | POA: Diagnosis present

## 2016-07-09 DIAGNOSIS — Z6835 Body mass index (BMI) 35.0-35.9, adult: Secondary | ICD-10-CM

## 2016-07-09 DIAGNOSIS — E669 Obesity, unspecified: Secondary | ICD-10-CM | POA: Diagnosis present

## 2016-07-09 DIAGNOSIS — S299XXA Unspecified injury of thorax, initial encounter: Secondary | ICD-10-CM | POA: Diagnosis not present

## 2016-07-09 DIAGNOSIS — Y92009 Unspecified place in unspecified non-institutional (private) residence as the place of occurrence of the external cause: Secondary | ICD-10-CM

## 2016-07-09 DIAGNOSIS — G9341 Metabolic encephalopathy: Secondary | ICD-10-CM | POA: Diagnosis not present

## 2016-07-09 DIAGNOSIS — S0990XA Unspecified injury of head, initial encounter: Secondary | ICD-10-CM | POA: Diagnosis not present

## 2016-07-09 DIAGNOSIS — N189 Chronic kidney disease, unspecified: Secondary | ICD-10-CM

## 2016-07-09 DIAGNOSIS — D496 Neoplasm of unspecified behavior of brain: Secondary | ICD-10-CM

## 2016-07-09 DIAGNOSIS — Z882 Allergy status to sulfonamides status: Secondary | ICD-10-CM

## 2016-07-09 DIAGNOSIS — F419 Anxiety disorder, unspecified: Secondary | ICD-10-CM | POA: Diagnosis present

## 2016-07-09 DIAGNOSIS — Z9221 Personal history of antineoplastic chemotherapy: Secondary | ICD-10-CM

## 2016-07-09 DIAGNOSIS — W1830XA Fall on same level, unspecified, initial encounter: Secondary | ICD-10-CM | POA: Diagnosis present

## 2016-07-09 DIAGNOSIS — Z79899 Other long term (current) drug therapy: Secondary | ICD-10-CM

## 2016-07-09 DIAGNOSIS — N179 Acute kidney failure, unspecified: Secondary | ICD-10-CM | POA: Diagnosis not present

## 2016-07-09 DIAGNOSIS — N390 Urinary tract infection, site not specified: Secondary | ICD-10-CM | POA: Diagnosis present

## 2016-07-09 DIAGNOSIS — G936 Cerebral edema: Secondary | ICD-10-CM | POA: Diagnosis not present

## 2016-07-09 DIAGNOSIS — Z880 Allergy status to penicillin: Secondary | ICD-10-CM

## 2016-07-09 DIAGNOSIS — I10 Essential (primary) hypertension: Secondary | ICD-10-CM | POA: Diagnosis present

## 2016-07-09 DIAGNOSIS — L8990 Pressure ulcer of unspecified site, unspecified stage: Secondary | ICD-10-CM | POA: Diagnosis not present

## 2016-07-09 DIAGNOSIS — Z170 Estrogen receptor positive status [ER+]: Secondary | ICD-10-CM

## 2016-07-09 DIAGNOSIS — Z01818 Encounter for other preprocedural examination: Secondary | ICD-10-CM

## 2016-07-09 DIAGNOSIS — R319 Hematuria, unspecified: Secondary | ICD-10-CM

## 2016-07-09 DIAGNOSIS — M6282 Rhabdomyolysis: Secondary | ICD-10-CM | POA: Diagnosis not present

## 2016-07-09 DIAGNOSIS — I129 Hypertensive chronic kidney disease with stage 1 through stage 4 chronic kidney disease, or unspecified chronic kidney disease: Secondary | ICD-10-CM | POA: Diagnosis present

## 2016-07-09 DIAGNOSIS — S4991XA Unspecified injury of right shoulder and upper arm, initial encounter: Secondary | ICD-10-CM | POA: Diagnosis not present

## 2016-07-09 DIAGNOSIS — T796XXA Traumatic ischemia of muscle, initial encounter: Secondary | ICD-10-CM

## 2016-07-09 DIAGNOSIS — S3993XA Unspecified injury of pelvis, initial encounter: Secondary | ICD-10-CM | POA: Diagnosis not present

## 2016-07-09 DIAGNOSIS — S199XXA Unspecified injury of neck, initial encounter: Secondary | ICD-10-CM | POA: Diagnosis not present

## 2016-07-09 DIAGNOSIS — S8992XA Unspecified injury of left lower leg, initial encounter: Secondary | ICD-10-CM | POA: Diagnosis not present

## 2016-07-09 DIAGNOSIS — S0003XA Contusion of scalp, initial encounter: Secondary | ICD-10-CM | POA: Diagnosis present

## 2016-07-09 DIAGNOSIS — W19XXXA Unspecified fall, initial encounter: Secondary | ICD-10-CM | POA: Diagnosis present

## 2016-07-09 DIAGNOSIS — M25562 Pain in left knee: Secondary | ICD-10-CM | POA: Diagnosis present

## 2016-07-09 DIAGNOSIS — Z79811 Long term (current) use of aromatase inhibitors: Secondary | ICD-10-CM

## 2016-07-09 DIAGNOSIS — T380X5A Adverse effect of glucocorticoids and synthetic analogues, initial encounter: Secondary | ICD-10-CM | POA: Diagnosis not present

## 2016-07-09 DIAGNOSIS — C50412 Malignant neoplasm of upper-outer quadrant of left female breast: Secondary | ICD-10-CM | POA: Diagnosis present

## 2016-07-09 DIAGNOSIS — Z66 Do not resuscitate: Secondary | ICD-10-CM | POA: Diagnosis present

## 2016-07-09 DIAGNOSIS — C50919 Malignant neoplasm of unspecified site of unspecified female breast: Secondary | ICD-10-CM

## 2016-07-09 DIAGNOSIS — R001 Bradycardia, unspecified: Secondary | ICD-10-CM | POA: Diagnosis present

## 2016-07-09 LAB — URINE MICROSCOPIC-ADD ON

## 2016-07-09 LAB — CBC AND DIFFERENTIAL
HCT: 44 % (ref 36–46)
HEMOGLOBIN: 15.3 g/dL (ref 12.0–16.0)
Platelets: 222 10*3/uL (ref 150–399)
WBC: 16.3 10*3/mL

## 2016-07-09 LAB — URINALYSIS, ROUTINE W REFLEX MICROSCOPIC
Bilirubin Urine: NEGATIVE
GLUCOSE, UA: NEGATIVE mg/dL
KETONES UR: 15 mg/dL — AB
Nitrite: NEGATIVE
PROTEIN: 30 mg/dL — AB
Specific Gravity, Urine: 1.019 (ref 1.005–1.030)
pH: 5 (ref 5.0–8.0)

## 2016-07-09 LAB — BASIC METABOLIC PANEL
BUN: 34 mg/dL — AB (ref 4–21)
Creatinine: 1.6 mg/dL — AB (ref 0.5–1.1)
Glucose: 128 mg/dL
POTASSIUM: 3.6 mmol/L (ref 3.4–5.3)
Sodium: 136 mmol/L — AB (ref 137–147)

## 2016-07-09 LAB — HEPATIC FUNCTION PANEL: BILIRUBIN, TOTAL: 0.9 mg/dL

## 2016-07-09 MED ORDER — ONDANSETRON 4 MG PO TBDP
4.0000 mg | ORAL_TABLET | Freq: Once | ORAL | Status: AC
  Administered 2016-07-09: 4 mg via ORAL
  Filled 2016-07-09: qty 1

## 2016-07-09 MED ORDER — ONDANSETRON 4 MG PO TBDP: 4.0000 mg | ORAL_TABLET | Freq: Once | ORAL | Status: DC

## 2016-07-09 MED ORDER — SODIUM CHLORIDE 0.9 % IV BOLUS (SEPSIS)
1000.0000 mL | Freq: Once | INTRAVENOUS | Status: AC
  Administered 2016-07-10: 1000 mL via INTRAVENOUS

## 2016-07-09 NOTE — ED Provider Notes (Signed)
[facility] DEPT Provider Note   CSN: [id number] Arrival date & time: 07/09/16  2144     History   Chief Complaint Chief Complaint  Patient presents with  . Fall  . Weakness    HPI Savannah FREILICH is a 70 y.o. female.  Patient with a history of HTN. HLD, GERD, breast CA s/p mastectomy and chemo (2016) presents after fall she reports was due to weakness, stating "my legs just gave out". She fell against her bed and became wedged against her dresser. She was unable to move and alone in the house. She states is was 20 hours before she could move toward the phone to call for help. She complains of right shoulder pain, low back pain and right scalp contusion. There is a friend here who reports she has a new facial droop and slurred speech from her baseline condition, and has been confused today which is also abnormal for patient. No recent fevers. No dysuria, chest or abdominal pain. She reports having diarrhea yesterday with nausea and vomiting.    The history is provided by the patient and a friend. No language interpreter was used.    Past Medical History:  Diagnosis Date  . Allergy    rhinitis  . Anemia   . Anxiety   . DDD (degenerative disc disease), lumbar dx'd in 1980's  . GERD (gastroesophageal reflux disease)   . Heart murmur   . History of blood transfusion 2016 X 3 (05/30/2015)  . Hypertension   . Kidney stone 1990's X 1   "passed it"  . Obesity   . Primary cancer of upper outer quadrant of left breast ([place]) dx'd 03/2015  . Vitamin B 12 deficiency   . Vitamin D deficiency     Patient Active Problem List   Diagnosis Date Noted  . Antineoplastic chemotherapy induced anemia 04/02/2015  . Weakness generalized 01/29/2015  . Dehydration 01/28/2015  . Rash 01/28/2015  . Chemotherapy-induced nausea 01/08/2015  . Chemotherapy induced diarrhea 01/08/2015  . Mucositis due to chemotherapy 01/08/2015  . Breast cancer of upper-outer quadrant of left female breast ([place])  12/20/2014  . Well adult exam 10/26/2012  . Dyslipidemia 06/23/2011  . Hypertension 12/22/2010  . UPPER RESPIRATORY INFECTION, ACUTE 04/30/2010  . Anxiety state 10/28/2009  . VITAMIN D DEFICIENCY 04/24/2009  . HYPERGLYCEMIA 04/24/2009  . VITAMIN B12 DEFICIENCY 10/22/2008  . ALLERGIC RHINITIS 05/09/2007  . GERD 05/09/2007    Past Surgical History:  Procedure Laterality Date  . BREAST BIOPSY Left ~ 12/2014  . CHOLECYSTECTOMY OPEN  ~ 1975  . ESOPHAGEAL DILATION  X 3  . MASTECTOMY COMPLETE / SIMPLE W/ SENTINEL NODE BIOPSY Left 05/30/2015  . MASTECTOMY W/ SENTINEL NODE BIOPSY Left 05/30/2015   Procedure: LEFT MASTECTOMY WITH SENTINEL LYMPH NODE BIOPSY;  Surgeon: Autumn Messing III, MD;  Location: [facility];  Service: General;  Laterality: Left;  . PORTACATH PLACEMENT Right 12/26/2014   Procedure: INSERTION PORT-A-CATH;  Surgeon: Autumn Messing III, MD;  Location: [facility];  Service: General;  Laterality: Right;    OB History    No data available       Home Medications    Prior to Admission medications   Medication Sig Start Date End Date Taking? Authorizing Provider  acetaminophen (TYLENOL) 500 MG tablet Take 1,000 mg by mouth every 6 (six) hours as needed for mild pain.    Historical Provider, MD  anastrozole (ARIMIDEX) 1 MG tablet Take 1 tablet (1 mg total) by mouth daily. 07/15/15  Nicholas Lose, MD  cyanocobalamin (,VITAMIN B-12,) 1000 MCG/ML injection INJECT 1 MILLILITER INTRAMUSCULARLY EVERY 30 DAYS 03/30/16   [name] [name], MD  lidocaine-prilocaine (EMLA) cream APPLY TO AFFECTED AREA ONCE 06/06/15   Historical Provider, MD  LORazepam (ATIVAN) 0.5 MG tablet TAKE 1 TABLET BY MOUTH 3 TIMES A DAY AS NEEDED FOR ANXIETY 03/31/16   [name], MD  losartan-hydrochlorothiazide (HYZAAR) 100-25 MG tablet TAKE 1 TABLET BY MOUTH EVERY DAY 03/30/16   [name], MD  metoprolol succinate (TOPROL XL) 25 MG 24 hr tablet Take 1 tablet (25 mg total) by mouth daily.  11/19/15   Jolaine Artist, MD  omeprazole (PRILOSEC) 20 MG capsule Take 1 capsule (20 mg total) by mouth daily. 03/28/15   [name] [name], MD  potassium chloride (MICRO-K) 10 MEQ CR capsule TAKE ONE CAPSULE BY MOUTH EVERY DAY 03/30/16   [name], MD  terazosin (HYTRIN) 5 MG capsule TAKE 1 CAPSULE (5 MG TOTAL) BY MOUTH AT BEDTIME. 03/30/16   Nicholas Lose, MD  vitamin B-12 (CYANOCOBALAMIN) 1000 MCG tablet Take 1,000 mcg by mouth daily.    Historical Provider, MD    Family History Family History  Problem Relation Age of Onset  . Heart disease Mother     CAD  . COPD Mother   . Heart disease Father 60    leaky valve  . Parkinsonism Father   . Hypertension Other     Social History Social History  Substance Use Topics  . Smoking status: Never Smoker  . Smokeless tobacco: Never Used  . Alcohol use Yes     Comment: 05/30/2015 "I might take a drink of wine 1-2 times/yr"     Allergies   Penicillins and Sulfonamide derivatives   Review of Systems Review of Systems  Constitutional: Negative for chills and fever.  HENT: Negative.   Eyes: Negative for visual disturbance.  Respiratory: Negative.  Negative for shortness of breath.   Cardiovascular: Negative.  Negative for chest pain.  Gastrointestinal: Positive for diarrhea, nausea and vomiting. Negative for abdominal pain.  Genitourinary: Negative.  Negative for decreased urine volume, dysuria and frequency.  Musculoskeletal: Positive for back pain. Negative for neck pain.       See HPI.  Skin: Positive for wound.  Neurological: Positive for weakness. Negative for syncope and numbness.  Hematological: Does not bruise/bleed easily.  Psychiatric/Behavioral: Positive for confusion.     Physical Exam Updated Vital Signs BP 155/76 (BP Location: Right Arm)   Pulse 82   Temp 98.2 F (36.8 C) (Oral)   Resp 20   Ht 5\' 2"  (1.575 m)   SpO2 100%   Physical Exam  Constitutional: She is oriented to person, place, and  time. She appears well-developed and well-nourished.  HENT:  Head: Normocephalic.  No hemotympanum. There is facial bruising on left lateral periorbital area, abrasion and redness to left ear pinna. There are scalp hematomas x 2, to right and left postauricular area. No laceration or open wounds.  Eyes: Pupils are equal, round, and reactive to light.  Neck: Normal range of motion. Neck supple.  Cardiovascular: Normal rate and regular rhythm.   No murmur heard. Pulmonary/Chest: Effort normal and breath sounds normal. She has no wheezes. She has no rales.  Abdominal: Soft. Bowel sounds are normal. There is no tenderness. There is no rebound and no guarding.  Abdomen has no bruising, abrasion or tenderness.   Musculoskeletal: Normal range of motion. She exhibits no edema.  Abrasion to anterior  left knee without swelling or deformity. FROM of the joint. Right shoulder has no bony deformity. FROM preserved. No pelvic or hip tenderness. There is midline tenderness of the lower back. No midline cervical tenderness.   Neurological: She is alert and oriented to person, place, and time. She has normal strength. She displays tremor. No sensory deficit. Coordination normal. GCS eye subscore is 4. GCS verbal subscore is 5. GCS motor subscore is 6.  There is right facial weakness while speaking that is not noticed with cranial nerve testing. Speech not slurred. She is fully oriented. Follows commands. Moves all extremities.  Skin: Skin is warm and dry. No rash noted.  Psychiatric: She has a normal mood and affect.     ED Treatments / Results  Labs (all labs ordered are listed, but only abnormal results are displayed) Labs Reviewed  URINE CULTURE  CBC WITH DIFFERENTIAL/PLATELET  COMPREHENSIVE METABOLIC PANEL  CK  URINALYSIS, ROUTINE W REFLEX MICROSCOPIC (NOT AT [facility])    EKG  EKG Interpretation None       Radiology No results found.  Procedures Procedures (including critical care  time)  Medications Ordered in ED Medications  sodium chloride 0.9 % bolus 1,000 mL (not administered)  ondansetron (ZOFRAN-ODT) disintegrating tablet 4 mg (not administered)  ondansetron (ZOFRAN-ODT) disintegrating tablet 4 mg (4 mg Oral Given 07/09/16 2237)     Initial Impression / Assessment and Plan / ED Course  I have reviewed the triage vital signs and the nursing notes.  Pertinent labs & imaging results that were available during my care of the patient were reviewed by me and considered in my medical decision making (see chart for details).  Clinical Course    Patient presents after fall yesterday and prolonged time on the floor without being able to get up. She is found to have a mild UTI and rhabdomyolysis requiring admission. VSS. She is more comfortable. Declines pain medication.   Discussed with Dr. Blaine Hamper who accepts the patient for admission.  Final Clinical Impressions(s) / ED Diagnoses   Final diagnoses:  None   1. Rhabdomyolysis 2. UTI  New Prescriptions New Prescriptions   No medications on file     Charlann Lange, PA-C 07/10/16 [place], DO 07/10/16 [phone]

## 2016-07-09 NOTE — ED Notes (Signed)
Pt transported to XR.  

## 2016-07-09 NOTE — ED Notes (Signed)
Pt. returned from XR. 

## 2016-07-09 NOTE — ED Notes (Signed)
Called phlebotomy regarding labs.

## 2016-07-09 NOTE — ED Notes (Signed)
IV team at bedside 

## 2016-07-09 NOTE — ED Triage Notes (Signed)
Pt presents to the ED via EMS after laying on the floor for 24 hours after a fall. Per EMS, pt "can't remember why she fell" and stated she "couldn't get up". Per EMS, pt is weak on the L side, and has a lac to her R hip. Dried blood noted on hair on the back of the R side of pt's head. Pt with hx of cancer. Per EMS, pt A&Ox4 with unremarkable 12-lead. CBG 236. 162/88. 72 HR. 20 RR.

## 2016-07-09 NOTE — ED Notes (Signed)
Phlebotomy at bedside.

## 2016-07-10 ENCOUNTER — Inpatient Hospital Stay (HOSPITAL_COMMUNITY): Payer: Medicare Other

## 2016-07-10 ENCOUNTER — Encounter (HOSPITAL_COMMUNITY): Payer: Self-pay | Admitting: *Deleted

## 2016-07-10 ENCOUNTER — Emergency Department (HOSPITAL_COMMUNITY): Payer: Medicare Other

## 2016-07-10 DIAGNOSIS — Y92009 Unspecified place in unspecified non-institutional (private) residence as the place of occurrence of the external cause: Secondary | ICD-10-CM | POA: Diagnosis not present

## 2016-07-10 DIAGNOSIS — Z79811 Long term (current) use of aromatase inhibitors: Secondary | ICD-10-CM | POA: Diagnosis not present

## 2016-07-10 DIAGNOSIS — E785 Hyperlipidemia, unspecified: Secondary | ICD-10-CM | POA: Diagnosis not present

## 2016-07-10 DIAGNOSIS — S0990XA Unspecified injury of head, initial encounter: Secondary | ICD-10-CM | POA: Diagnosis not present

## 2016-07-10 DIAGNOSIS — Z79899 Other long term (current) drug therapy: Secondary | ICD-10-CM | POA: Diagnosis not present

## 2016-07-10 DIAGNOSIS — C712 Malignant neoplasm of temporal lobe: Secondary | ICD-10-CM | POA: Diagnosis not present

## 2016-07-10 DIAGNOSIS — Z882 Allergy status to sulfonamides status: Secondary | ICD-10-CM | POA: Diagnosis not present

## 2016-07-10 DIAGNOSIS — C50919 Malignant neoplasm of unspecified site of unspecified female breast: Secondary | ICD-10-CM | POA: Diagnosis not present

## 2016-07-10 DIAGNOSIS — Z9012 Acquired absence of left breast and nipple: Secondary | ICD-10-CM | POA: Diagnosis not present

## 2016-07-10 DIAGNOSIS — R220 Localized swelling, mass and lump, head: Secondary | ICD-10-CM | POA: Diagnosis not present

## 2016-07-10 DIAGNOSIS — I158 Other secondary hypertension: Secondary | ICD-10-CM

## 2016-07-10 DIAGNOSIS — N189 Chronic kidney disease, unspecified: Secondary | ICD-10-CM

## 2016-07-10 DIAGNOSIS — S199XXA Unspecified injury of neck, initial encounter: Secondary | ICD-10-CM | POA: Diagnosis not present

## 2016-07-10 DIAGNOSIS — C50412 Malignant neoplasm of upper-outer quadrant of left female breast: Secondary | ICD-10-CM

## 2016-07-10 DIAGNOSIS — R55 Syncope and collapse: Secondary | ICD-10-CM | POA: Diagnosis not present

## 2016-07-10 DIAGNOSIS — K219 Gastro-esophageal reflux disease without esophagitis: Secondary | ICD-10-CM

## 2016-07-10 DIAGNOSIS — L8990 Pressure ulcer of unspecified site, unspecified stage: Secondary | ICD-10-CM | POA: Diagnosis not present

## 2016-07-10 DIAGNOSIS — N179 Acute kidney failure, unspecified: Secondary | ICD-10-CM | POA: Diagnosis present

## 2016-07-10 DIAGNOSIS — C7931 Secondary malignant neoplasm of brain: Secondary | ICD-10-CM | POA: Diagnosis not present

## 2016-07-10 DIAGNOSIS — G9341 Metabolic encephalopathy: Secondary | ICD-10-CM | POA: Diagnosis not present

## 2016-07-10 DIAGNOSIS — R319 Hematuria, unspecified: Secondary | ICD-10-CM

## 2016-07-10 DIAGNOSIS — B9689 Other specified bacterial agents as the cause of diseases classified elsewhere: Secondary | ICD-10-CM | POA: Diagnosis not present

## 2016-07-10 DIAGNOSIS — R350 Frequency of micturition: Secondary | ICD-10-CM | POA: Diagnosis present

## 2016-07-10 DIAGNOSIS — Z48811 Encounter for surgical aftercare following surgery on the nervous system: Secondary | ICD-10-CM | POA: Diagnosis not present

## 2016-07-10 DIAGNOSIS — R109 Unspecified abdominal pain: Secondary | ICD-10-CM | POA: Diagnosis not present

## 2016-07-10 DIAGNOSIS — N182 Chronic kidney disease, stage 2 (mild): Secondary | ICD-10-CM

## 2016-07-10 DIAGNOSIS — W1830XA Fall on same level, unspecified, initial encounter: Secondary | ICD-10-CM | POA: Diagnosis present

## 2016-07-10 DIAGNOSIS — N390 Urinary tract infection, site not specified: Secondary | ICD-10-CM | POA: Diagnosis not present

## 2016-07-10 DIAGNOSIS — T796XXD Traumatic ischemia of muscle, subsequent encounter: Secondary | ICD-10-CM | POA: Diagnosis not present

## 2016-07-10 DIAGNOSIS — Z66 Do not resuscitate: Secondary | ICD-10-CM | POA: Diagnosis present

## 2016-07-10 DIAGNOSIS — Z880 Allergy status to penicillin: Secondary | ICD-10-CM | POA: Diagnosis not present

## 2016-07-10 DIAGNOSIS — M25562 Pain in left knee: Secondary | ICD-10-CM | POA: Diagnosis present

## 2016-07-10 DIAGNOSIS — D496 Neoplasm of unspecified behavior of brain: Secondary | ICD-10-CM | POA: Diagnosis not present

## 2016-07-10 DIAGNOSIS — M6282 Rhabdomyolysis: Secondary | ICD-10-CM | POA: Diagnosis not present

## 2016-07-10 DIAGNOSIS — Z9181 History of falling: Secondary | ICD-10-CM | POA: Diagnosis not present

## 2016-07-10 DIAGNOSIS — C719 Malignant neoplasm of brain, unspecified: Secondary | ICD-10-CM | POA: Diagnosis not present

## 2016-07-10 DIAGNOSIS — S3991XA Unspecified injury of abdomen, initial encounter: Secondary | ICD-10-CM | POA: Diagnosis not present

## 2016-07-10 DIAGNOSIS — J9811 Atelectasis: Secondary | ICD-10-CM | POA: Diagnosis not present

## 2016-07-10 DIAGNOSIS — W19XXXA Unspecified fall, initial encounter: Secondary | ICD-10-CM | POA: Diagnosis present

## 2016-07-10 DIAGNOSIS — Z9221 Personal history of antineoplastic chemotherapy: Secondary | ICD-10-CM | POA: Diagnosis not present

## 2016-07-10 DIAGNOSIS — N3000 Acute cystitis without hematuria: Secondary | ICD-10-CM | POA: Diagnosis not present

## 2016-07-10 DIAGNOSIS — N170 Acute kidney failure with tubular necrosis: Secondary | ICD-10-CM | POA: Diagnosis not present

## 2016-07-10 DIAGNOSIS — R001 Bradycardia, unspecified: Secondary | ICD-10-CM | POA: Diagnosis present

## 2016-07-10 DIAGNOSIS — R2681 Unsteadiness on feet: Secondary | ICD-10-CM | POA: Diagnosis not present

## 2016-07-10 DIAGNOSIS — M6281 Muscle weakness (generalized): Secondary | ICD-10-CM | POA: Diagnosis not present

## 2016-07-10 DIAGNOSIS — I10 Essential (primary) hypertension: Secondary | ICD-10-CM | POA: Diagnosis not present

## 2016-07-10 DIAGNOSIS — T380X5A Adverse effect of glucocorticoids and synthetic analogues, initial encounter: Secondary | ICD-10-CM | POA: Diagnosis not present

## 2016-07-10 DIAGNOSIS — N183 Chronic kidney disease, stage 3 (moderate): Secondary | ICD-10-CM | POA: Diagnosis not present

## 2016-07-10 DIAGNOSIS — I129 Hypertensive chronic kidney disease with stage 1 through stage 4 chronic kidney disease, or unspecified chronic kidney disease: Secondary | ICD-10-CM | POA: Diagnosis not present

## 2016-07-10 DIAGNOSIS — R259 Unspecified abnormal involuntary movements: Secondary | ICD-10-CM | POA: Diagnosis not present

## 2016-07-10 DIAGNOSIS — G939 Disorder of brain, unspecified: Secondary | ICD-10-CM | POA: Diagnosis not present

## 2016-07-10 DIAGNOSIS — G936 Cerebral edema: Secondary | ICD-10-CM | POA: Diagnosis not present

## 2016-07-10 DIAGNOSIS — R2689 Other abnormalities of gait and mobility: Secondary | ICD-10-CM | POA: Diagnosis not present

## 2016-07-10 DIAGNOSIS — A419 Sepsis, unspecified organism: Secondary | ICD-10-CM | POA: Diagnosis present

## 2016-07-10 HISTORY — DX: Chronic kidney disease, unspecified: N18.9

## 2016-07-10 HISTORY — DX: Chronic kidney disease, unspecified: N17.9

## 2016-07-10 HISTORY — DX: Rhabdomyolysis: M62.82

## 2016-07-10 HISTORY — DX: Syncope and collapse: R55

## 2016-07-10 LAB — CBC WITH DIFFERENTIAL/PLATELET
BASOS ABS: 0 10*3/uL (ref 0.0–0.1)
Basophils Relative: 0 %
Eosinophils Absolute: 0 10*3/uL (ref 0.0–0.7)
Eosinophils Relative: 0 %
HEMATOCRIT: 44.4 % (ref 36.0–46.0)
HEMOGLOBIN: 15.3 g/dL — AB (ref 12.0–15.0)
LYMPHS PCT: 9 %
Lymphs Abs: 1.5 10*3/uL (ref 0.7–4.0)
MCH: 31.5 pg (ref 26.0–34.0)
MCHC: 34.5 g/dL (ref 30.0–36.0)
MCV: 91.5 fL (ref 78.0–100.0)
MONO ABS: 1.3 10*3/uL — AB (ref 0.1–1.0)
MONOS PCT: 8 %
NEUTROS ABS: 13.5 10*3/uL — AB (ref 1.7–7.7)
Neutrophils Relative %: 83 %
Platelets: 222 10*3/uL (ref 150–400)
RBC: 4.85 MIL/uL (ref 3.87–5.11)
RDW: 13.4 % (ref 11.5–15.5)
WBC: 16.3 10*3/uL — ABNORMAL HIGH (ref 4.0–10.5)

## 2016-07-10 LAB — BASIC METABOLIC PANEL
ANION GAP: 10 (ref 5–15)
BUN: 31 mg/dL — ABNORMAL HIGH (ref 6–20)
CHLORIDE: 103 mmol/L (ref 101–111)
CO2: 23 mmol/L (ref 22–32)
Calcium: 8.8 mg/dL — ABNORMAL LOW (ref 8.9–10.3)
Creatinine, Ser: 1.36 mg/dL — ABNORMAL HIGH (ref 0.44–1.00)
GFR calc Af Amer: 44 mL/min — ABNORMAL LOW (ref >60)
GFR, EST NON AFRICAN AMERICAN: 38 mL/min — AB (ref >60)
GLUCOSE: 145 mg/dL — AB (ref 65–99)
POTASSIUM: 4 mmol/L (ref 3.5–5.1)
Sodium: 136 mmol/L (ref 135–145)

## 2016-07-10 LAB — CK
CK TOTAL: 9810 U/L — AB (ref 38–234)
CK TOTAL: 10370 U/L — AB (ref 38–234)

## 2016-07-10 LAB — COMPREHENSIVE METABOLIC PANEL
ALK PHOS: 68 U/L (ref 38–126)
ALT: 22 U/L (ref 14–54)
AST: 71 U/L — AB (ref 15–41)
Albumin: 4.1 g/dL (ref 3.5–5.0)
Anion gap: 16 — ABNORMAL HIGH (ref 5–15)
BILIRUBIN TOTAL: 0.9 mg/dL (ref 0.3–1.2)
BUN: 34 mg/dL — AB (ref 6–20)
CALCIUM: 9.7 mg/dL (ref 8.9–10.3)
CO2: 21 mmol/L — ABNORMAL LOW (ref 22–32)
CREATININE: 1.55 mg/dL — AB (ref 0.44–1.00)
Chloride: 99 mmol/L — ABNORMAL LOW (ref 101–111)
GFR calc Af Amer: 38 mL/min — ABNORMAL LOW (ref >60)
GFR, EST NON AFRICAN AMERICAN: 33 mL/min — AB (ref >60)
Glucose, Bld: 128 mg/dL — ABNORMAL HIGH (ref 65–99)
POTASSIUM: 3.6 mmol/L (ref 3.5–5.1)
Sodium: 136 mmol/L (ref 135–145)
TOTAL PROTEIN: 7.6 g/dL (ref 6.5–8.1)

## 2016-07-10 LAB — CBG MONITORING, ED: GLUCOSE-CAPILLARY: 137 mg/dL — AB (ref 65–99)

## 2016-07-10 LAB — LACTIC ACID, PLASMA
LACTIC ACID, VENOUS: 1.6 mmol/L (ref 0.5–1.9)
Lactic Acid, Venous: 2.4 mmol/L (ref 0.5–1.9)

## 2016-07-10 LAB — BRAIN NATRIURETIC PEPTIDE: B NATRIURETIC PEPTIDE 5: 222.2 pg/mL — AB (ref 0.0–100.0)

## 2016-07-10 LAB — TROPONIN I: Troponin I: 0.78 ng/mL (ref <0.03)

## 2016-07-10 MED ORDER — ONDANSETRON HCL 4 MG/2ML IJ SOLN
4.0000 mg | Freq: Three times a day (TID) | INTRAMUSCULAR | Status: DC | PRN
  Administered 2016-07-11: 4 mg via INTRAVENOUS
  Filled 2016-07-10: qty 2

## 2016-07-10 MED ORDER — SODIUM CHLORIDE 0.9 % IV BOLUS (SEPSIS): 1000.0000 mL | Freq: Once | INTRAVENOUS | Status: DC

## 2016-07-10 MED ORDER — AMLODIPINE BESYLATE 5 MG PO TABS
5.0000 mg | ORAL_TABLET | Freq: Every day | ORAL | Status: DC
  Administered 2016-07-11: 5 mg via ORAL
  Filled 2016-07-10: qty 1

## 2016-07-10 MED ORDER — ENOXAPARIN SODIUM 40 MG/0.4ML ~~LOC~~ SOLN: 40.0000 mg | SUBCUTANEOUS | Status: DC

## 2016-07-10 MED ORDER — OXYCODONE-ACETAMINOPHEN 5-325 MG PO TABS
1.0000 | ORAL_TABLET | ORAL | Status: DC | PRN
  Administered 2016-07-10: 1 via ORAL
  Filled 2016-07-10: qty 1

## 2016-07-10 MED ORDER — SODIUM CHLORIDE 0.9 % IV SOLN
INTRAVENOUS | Status: AC
  Administered 2016-07-10: 07:00:00 via INTRAVENOUS

## 2016-07-10 MED ORDER — HYDRALAZINE HCL 20 MG/ML IJ SOLN: 5.0000 mg | INTRAMUSCULAR | Status: DC | PRN

## 2016-07-10 MED ORDER — GADOBENATE DIMEGLUMINE 529 MG/ML IV SOLN
8.0000 mL | Freq: Once | INTRAVENOUS | Status: AC
  Administered 2016-07-10: 8 mL via INTRAVENOUS

## 2016-07-10 MED ORDER — METOPROLOL SUCCINATE ER 25 MG PO TB24
25.0000 mg | ORAL_TABLET | Freq: Every day | ORAL | Status: DC
  Administered 2016-07-10 – 2016-07-11 (×2): 25 mg via ORAL
  Filled 2016-07-10 (×2): qty 1

## 2016-07-10 MED ORDER — TERAZOSIN HCL 5 MG PO CAPS
5.0000 mg | ORAL_CAPSULE | Freq: Every day | ORAL | Status: DC
  Administered 2016-07-10 – 2016-07-15 (×6): 5 mg via ORAL
  Filled 2016-07-10 (×7): qty 1

## 2016-07-10 MED ORDER — PANTOPRAZOLE SODIUM 40 MG PO TBEC
40.0000 mg | DELAYED_RELEASE_TABLET | Freq: Every day | ORAL | Status: DC
  Administered 2016-07-10 – 2016-07-12 (×3): 40 mg via ORAL
  Filled 2016-07-10 (×3): qty 1

## 2016-07-10 MED ORDER — DEXTROSE 5 % IV SOLN
1.0000 g | Freq: Once | INTRAVENOUS | Status: DC
  Filled 2016-07-10: qty 10

## 2016-07-10 MED ORDER — VITAMIN B-12 1000 MCG PO TABS
1000.0000 ug | ORAL_TABLET | Freq: Every day | ORAL | Status: DC
  Administered 2016-07-10 – 2016-07-16 (×6): 1000 ug via ORAL
  Filled 2016-07-10 (×6): qty 1

## 2016-07-10 MED ORDER — ANASTROZOLE 1 MG PO TABS
1.0000 mg | ORAL_TABLET | Freq: Every day | ORAL | Status: DC
  Administered 2016-07-10 – 2016-07-16 (×6): 1 mg via ORAL
  Filled 2016-07-10 (×7): qty 1

## 2016-07-10 MED ORDER — ACETAMINOPHEN 325 MG PO TABS
650.0000 mg | ORAL_TABLET | Freq: Four times a day (QID) | ORAL | Status: DC | PRN
  Administered 2016-07-11 – 2016-07-16 (×8): 650 mg via ORAL
  Filled 2016-07-10 (×8): qty 2

## 2016-07-10 MED ORDER — DEXAMETHASONE SODIUM PHOSPHATE 10 MG/ML IJ SOLN
10.0000 mg | Freq: Once | INTRAMUSCULAR | Status: AC
  Administered 2016-07-10: 10 mg via INTRAVENOUS
  Filled 2016-07-10: qty 1

## 2016-07-10 MED ORDER — SODIUM BICARBONATE 8.4 % IV SOLN: 150.0000 meq | Freq: Once | INTRAVENOUS | Status: DC

## 2016-07-10 MED ORDER — SODIUM CHLORIDE 0.9 % IV SOLN
INTRAVENOUS | Status: DC
  Administered 2016-07-10 – 2016-07-14 (×3): via INTRAVENOUS

## 2016-07-10 MED ORDER — LORAZEPAM 0.5 MG PO TABS
0.5000 mg | ORAL_TABLET | Freq: Three times a day (TID) | ORAL | Status: DC | PRN
  Administered 2016-07-11 (×2): 0.5 mg via ORAL
  Filled 2016-07-10 (×2): qty 1

## 2016-07-10 MED ORDER — SODIUM CHLORIDE 0.9% FLUSH
3.0000 mL | Freq: Two times a day (BID) | INTRAVENOUS | Status: DC
  Administered 2016-07-10 – 2016-07-15 (×7): 3 mL via INTRAVENOUS

## 2016-07-10 MED ORDER — DEXAMETHASONE SODIUM PHOSPHATE 4 MG/ML IJ SOLN
4.0000 mg | Freq: Three times a day (TID) | INTRAMUSCULAR | Status: DC
  Administered 2016-07-10 – 2016-07-11 (×2): 4 mg via INTRAVENOUS
  Filled 2016-07-10 (×2): qty 1

## 2016-07-10 MED ORDER — DEXAMETHASONE SODIUM PHOSPHATE 4 MG/ML IJ SOLN
4.0000 mg | Freq: Four times a day (QID) | INTRAMUSCULAR | Status: DC
  Administered 2016-07-10 – 2016-07-12 (×10): 4 mg via INTRAVENOUS
  Filled 2016-07-10 (×10): qty 1

## 2016-07-10 MED ORDER — DEXTROSE 5 % IV SOLN
1.0000 g | Freq: Two times a day (BID) | INTRAVENOUS | Status: DC
  Administered 2016-07-10 (×2): 1 g via INTRAVENOUS
  Filled 2016-07-10 (×4): qty 1

## 2016-07-10 NOTE — Progress Notes (Signed)
Pt received from ED with no noted distress. Pt stable, neuro intact. Pt oriented to room. Safety measures in place. Call bell within reach. Will continue to monitor. Report received by Raquel Sarna.

## 2016-07-10 NOTE — ED Notes (Signed)
Contacted CT - unable to take patient to CT prior to taking patient to inpatient unit.

## 2016-07-10 NOTE — Progress Notes (Signed)
CT reviewed and case discussed. Will evaluate patient tomorrow morning if MRI has been performed.

## 2016-07-10 NOTE — ED Notes (Signed)
Pt transported to CT ?

## 2016-07-10 NOTE — ED Notes (Signed)
Followed up with CT regarding scans

## 2016-07-10 NOTE — Progress Notes (Signed)
Pt on telemetry monitoring.

## 2016-07-10 NOTE — Progress Notes (Addendum)
[facility]                                                                              Patient Demographics  Savannah Howard, is a 70 y.o. female, DOB - 1945-09-02, [id number]  Admit date - 07/09/2016   Admitting Physician Ivor Costa, MD  Outpatient Primary MD for the patient is Walker Kehr, MD  Outpatient specialists:   LOS - 0  days    Chief Complaint  Patient presents with  . Fall  . Weakness       Brief summary   Savannah Howard is a 70 y.o. female with medical history significant of hypertension, GERD, anxiety, breast cancer (s/p of mastectomy and chemo 2016, on anastrozole), DDD, CKD-II, who presentede with fall, possible syncope and increased urinary frequency. Pt was found to have UTI, worsening renal function, CK 10,370. Chest x-ray negative. Patient had negative x-ray of left knee, lumbar, pelvis and right shoulder. CT of C-spine is negative for acute bony abnormalities. CT head showed cystic mass within the anterior right temporal lobe with mild surrounding vasogenic edema, no midline shift or mass effect.  Assessment & Plan     Rhabdomyolysis: [place] [address]. Has worsening renal function. Liver function normal -Continue IV fluid hydration, repeat CK and BMET  Fall and syncope: Likely due to intracranial mass. CT head showed cystic mass within the anterior right temporal lobe with mild surrounding vasogenic edema, no midline shift or mass effect. It is likely due to metastasize, given history of breast cancer. - will get 2d echo and carotid artery Doppler - Patient needs MRI with and without contrast to further evaluate intracranial mass and also rule out stroke.  - cont Decadron 4 mg every 6 hours - PT evaluation  New intracranial mass - Continue Decadron - Stat BMET, if creatinine is normal, will obtain MRI brain with and without contrast. Dr. Alvy Bimler also recommended CT chest and CT abdomen with contrast. - consulted neurosurgery, spoke with  Dr Cyndy Freeze, will evaluate after MRI brain  - discussed with oncology, Dr Alvy Bimler, Dr Lindi Adie will follow   GERD: -Protonix  Breast cancer of upper-outer quadrant of left female breast [facility]): s/p of mastectomy and chemotherapy 2016. Currently on anastrozole -Continue anastrozole  UTI and sepsis: UA is positive for UTI. Patient has sepsis with leukocytosis and elevated lactate 2.4 which is normalized to 1.6. Currently hemodynamically stable. -IV aztreonam - Follow blood cultures and urine cultures  HTN: -continue metoprolol and Terazosin  GERD: -Protonix  AoCKD-II: Baseline Cre is 1.1-1.2 , pt's Cre is 1.55 on admission. Likely due to UTI and rhabdomyolysis -  continue fluids, follow BMET  Nausea, vomiting: Etiology is not clear. Currently no abdominal pain or diarrhea. May be due to brain mass. -Check lipase -Appearing Zofran  Left knee pain From the fall but no fracture or dislocation noted on the x-ray, although imaging's negative  Code Status: DO NOT RESUSCITATE DVT Prophylaxis:   SCD's Family Communication: Discussed in detail with the patient, all imaging results, lab results explained to the patient    Disposition Plan:   Time Spent in minutes   25 minutes  Procedures:    Consultants:   Oncology NS  Antimicrobials:   Aztreonam   Medications  Scheduled Meds: . amLODipine  5 mg Oral Daily  . anastrozole  1 mg Oral Daily  . dexamethasone  4 mg Intravenous Q6H  . metoprolol succinate  25 mg Oral Daily  . pantoprazole  40 mg Oral Daily  . sodium chloride flush  3 mL Intravenous Q12H  . terazosin  5 mg Oral QHS  . vitamin B-12  1,000 mcg Oral Daily   Continuous Infusions: . sodium chloride 125 mL/hr at 07/10/16 0635  . sodium chloride    . aztreonam Stopped (07/10/16 0705)   PRN Meds:.acetaminophen, hydrALAZINE, LORazepam, ondansetron, oxyCODONE-acetaminophen   Antibiotics   Anti-infectives    Start     Dose/Rate Route Frequency Ordered Stop    07/10/16 0400  aztreonam (AZACTAM) 1 g in dextrose 5 % 50 mL IVPB     1 g 100 mL/hr over 30 Minutes Intravenous Every 12 hours 07/10/16 0329     07/10/16 0115  cefTRIAXone (ROCEPHIN) 1 g in dextrose 5 % 50 mL IVPB  Status:  Discontinued     1 g 100 mL/hr over 30 Minutes Intravenous  Once 07/10/16 0108 07/10/16 0214        Subjective:   Savannah Howard was seen and examined today. Patient denies chest pain, shortness of breath, abdominal pain, N/V/D/C, new weakness, numbess, tingling. Feels weak, no headache or blurry vision or dizziness.  Objective:   Vitals:   07/10/16 1000 07/10/16 1030 07/10/16 1100 07/10/16 1130  BP: 159/78 (!) 162/101 101/86 158/57  Pulse: 79 79 74 64  Resp: 20 11 12 16   Temp:      TempSrc:      SpO2: 96% 96% 95% 91%  Weight:      Height:        Intake/Output Summary (Last 24 hours) at 07/10/16 1220 Last data filed at 07/10/16 0025  Gross per 24 hour  Intake                0 ml  Output              900 ml  Net             -900 ml     Wt Readings from Last 3 Encounters:  07/10/16 86.2 kg (190 lb)  04/24/16 91.6 kg (202 lb)  11/19/15 92.3 kg (203 lb 8 oz)     Exam  General: Alert and oriented x 3, NAD  HEENT:  PERRLA, EOMI, Anicteric Sclera, mucous membranes moist.   Neck: Supple, no JVD, no masses  Cardiovascular: S1 S2 auscultated, no rubs, murmurs or gallops. Regular rate and rhythm.  Respiratory: Clear to auscultation bilaterally, no wheezing, rales or rhonchi  Gastrointestinal: Soft, nontender, nondistended, + bowel sounds  Ext: no cyanosis clubbing or edema, Pain in her left leg and knee, bruising  Neuro: AAOx3, Cr N's II- XII. Strength 5/5 upper and lower extremities bilaterally  Skin: No rashes  Psych: Normal affect and demeanor, alert and oriented x3    Data Reviewed:  I have personally reviewed following labs and imaging studies  Micro Results No results found for this or any previous visit (from the past 240  hour(s)).  Radiology Reports Dg Chest 1 View  Result Date: 07/09/2016 CLINICAL DATA:  Found on floor after fall EXAM: CHEST 1 VIEW COMPARISON:  12/31/2014 FINDINGS: A right-sided chest port is in place, the superior portion of the catheter  is not included on the image. The tip of the catheter projects over expected location of SVC. Surgical clips in the left axilla and within the right upper quadrant. There is mild cardiomegaly without overt failure. Mediastinal contour is exaggerated by patient rotation. Mild atelectasis left base. No acute infiltrate. No pneumothorax. IMPRESSION: 1. Mild cardiomegaly without overt failure 2. Mild atelectasis at the left lung base. Electronically Signed   By: Donavan Foil M.D.   On: 07/09/2016 23:38   Dg Lumbar Spine Complete  Result Date: 07/09/2016 CLINICAL DATA:  Fall extremity weakness EXAM: LUMBAR SPINE - COMPLETE 4+ VIEW COMPARISON:  None. FINDINGS: Surgical clips are present in the right upper quadrant. There are 5 non rib-bearing lumbar type vertebra. Calcified pelvic phleboliths. Lumbar alignment within normal limits. Severe narrowing at L5-S1 with endplate changes. Moderate narrowing at L1-L2. The set degenerative changes of the lower lumbar spine. Atherosclerosis of the aorta. IMPRESSION: Degenerative changes.  No acute osseous abnormality. Electronically Signed   By: Donavan Foil M.D.   On: 07/09/2016 23:42   Dg Pelvis 1-2 Views  Result Date: 07/09/2016 CLINICAL DATA:  Found down, fall with leg weakness EXAM: PELVIS - 1-2 VIEW COMPARISON:  None. FINDINGS: SI joints are symmetric. Pubic symphysis is intact. No fracture or dislocation. IMPRESSION: No acute osseous abnormality Electronically Signed   By: Donavan Foil M.D.   On: 07/09/2016 23:45   Dg Shoulder Right  Result Date: 07/09/2016 CLINICAL DATA:  Fall EXAM: RIGHT SHOULDER - 2+ VIEW COMPARISON:  None. FINDINGS: There is no evidence of fracture or dislocation. There is no evidence of  arthropathy or other focal bone abnormality. Soft tissues are unremarkable. IMPRESSION: Negative. Electronically Signed   By: Donavan Foil M.D.   On: 07/09/2016 23:45   Ct Head Wo Contrast  Result Date: 07/10/2016 CLINICAL DATA:  Status post fall with impact to posterior head. EXAM: CT HEAD WITHOUT CONTRAST CT CERVICAL SPINE WITHOUT CONTRAST TECHNIQUE: Multidetector CT imaging of the head and cervical spine was performed following the standard protocol without intravenous contrast. Multiplanar CT image reconstructions of the cervical spine were also generated. COMPARISON:  None. FINDINGS: CT HEAD FINDINGS Brain: There is a cystic mass within the anterior right temporal lobe that measures 3.9 x 3.5 cm with adjacent vasogenic edema extending into the right temporal and lower parietal white matter. There is no midline shift or other mass effect. No hydrocephalus. No intracranial hemorrhage or evidence of acute cortical infarct. The remainder the brain parenchyma is normal. No age advanced or lobar predominant atrophy. Vascular: Atherosclerotic calcification of the vertebral and internal carotid arteries at the skullbase is present. Skull: No skull fracture or focal calvarial lesion. Visualized skull base is normal. Sinuses/Orbits: No sinus fluid levels or advanced mucosal thickening. No mastoid effusion. Normal orbits. CT CERVICAL SPINE FINDINGS Alignment: No static subluxation. Facets are aligned. Occipital condyles are normally positioned. Skull base and vertebrae: No acute fracture. Soft tissues and spinal canal: No prevertebral fluid or swelling. No visible canal hematoma. Disc levels: No advanced spinal canal or neural foraminal stenosis. Upper chest: Left subarticular predominant C5-C6 disc osteophyte complex narrows the left lateral recess and left neural foramen. Other: Normal visualized paraspinal cervical soft tissues. IMPRESSION: 1. Predominantly cystic mass within the anterior right temporal lobe with  mild surrounding vasogenic edema. Given the patient's history of breast cancer, this is favored to indicate metastatic disease. A primary CNS neoplasm, such as a glioma, is also a consideration. MRI is recommended for further characterization. 2. No  midline shift or mass effect. 3. No acute fracture or static subluxation of the cervical spine. Electronically Signed   By: Ulyses Jarred M.D.   On: 07/10/2016 02:23   Ct Cervical Spine Wo Contrast  Result Date: 07/10/2016 CLINICAL DATA:  Status post fall with impact to posterior head. EXAM: CT HEAD WITHOUT CONTRAST CT CERVICAL SPINE WITHOUT CONTRAST TECHNIQUE: Multidetector CT imaging of the head and cervical spine was performed following the standard protocol without intravenous contrast. Multiplanar CT image reconstructions of the cervical spine were also generated. COMPARISON:  None. FINDINGS: CT HEAD FINDINGS Brain: There is a cystic mass within the anterior right temporal lobe that measures 3.9 x 3.5 cm with adjacent vasogenic edema extending into the right temporal and lower parietal white matter. There is no midline shift or other mass effect. No hydrocephalus. No intracranial hemorrhage or evidence of acute cortical infarct. The remainder the brain parenchyma is normal. No age advanced or lobar predominant atrophy. Vascular: Atherosclerotic calcification of the vertebral and internal carotid arteries at the skullbase is present. Skull: No skull fracture or focal calvarial lesion. Visualized skull base is normal. Sinuses/Orbits: No sinus fluid levels or advanced mucosal thickening. No mastoid effusion. Normal orbits. CT CERVICAL SPINE FINDINGS Alignment: No static subluxation. Facets are aligned. Occipital condyles are normally positioned. Skull base and vertebrae: No acute fracture. Soft tissues and spinal canal: No prevertebral fluid or swelling. No visible canal hematoma. Disc levels: No advanced spinal canal or neural foraminal stenosis. Upper chest: Left  subarticular predominant C5-C6 disc osteophyte complex narrows the left lateral recess and left neural foramen. Other: Normal visualized paraspinal cervical soft tissues. IMPRESSION: 1. Predominantly cystic mass within the anterior right temporal lobe with mild surrounding vasogenic edema. Given the patient's history of breast cancer, this is favored to indicate metastatic disease. A primary CNS neoplasm, such as a glioma, is also a consideration. MRI is recommended for further characterization. 2. No midline shift or mass effect. 3. No acute fracture or static subluxation of the cervical spine. Electronically Signed   By: Ulyses Jarred M.D.   On: 07/10/2016 02:23   Dg Knee Complete 4 Views Left  Result Date: 07/09/2016 CLINICAL DATA:  Found on floor, fall weakness EXAM: LEFT KNEE - COMPLETE 4+ VIEW COMPARISON:  None. FINDINGS: No acute displaced fracture or dislocation. Mild narrowing of the medial and lateral joint space compartments with bony spurring. Superior and inferior patellar osteophytes. Vascular calcifications. IMPRESSION: No acute osseous abnormality Electronically Signed   By: Donavan Foil M.D.   On: 07/09/2016 23:40    Lab Data:  CBC:  Recent Labs Lab 07/09/16 2339  WBC 16.3*  NEUTROABS 13.5*  HGB 15.3*  HCT 44.4  MCV 91.5  PLT [id number]   Basic Metabolic Panel:  Recent Labs Lab 07/09/16 2339  NA 136  K 3.6  CL 99*  CO2 21*  GLUCOSE 128*  BUN 34*  CREATININE 1.55*  CALCIUM 9.7   GFR: Estimated Creatinine Clearance: 33.9 mL/min (by C-G formula based on SCr of 1.55 mg/dL (H)). Liver Function Tests:  Recent Labs Lab 07/09/16 2339  AST 71*  ALT 22  ALKPHOS 68  BILITOT 0.9  PROT 7.6  ALBUMIN 4.1   No results for input(s): LIPASE, AMYLASE in the last 168 hours. No results for input(s): AMMONIA in the last 168 hours. Coagulation Profile: No results for input(s): INR, PROTIME in the last 168 hours. Cardiac Enzymes:  Recent Labs Lab 07/09/16 2339  07/10/16 0420  CKTOTAL 10,370* 9,810*  TROPONINI  --  0.78*   BNP (last 3 results) No results for input(s): PROBNP in the last 8760 hours. HbA1C: No results for input(s): HGBA1C in the last 72 hours. CBG:  Recent Labs Lab 07/10/16 0647  GLUCAP 137*   Lipid Profile: No results for input(s): CHOL, HDL, LDLCALC, TRIG, CHOLHDL, LDLDIRECT in the last 72 hours. Thyroid Function Tests: No results for input(s): TSH, T4TOTAL, FREET4, T3FREE, THYROIDAB in the last 72 hours. Anemia Panel: No results for input(s): VITAMINB12, FOLATE, FERRITIN, TIBC, IRON, RETICCTPCT in the last 72 hours. Urine analysis:    Component Value Date/Time   COLORURINE YELLOW 07/09/2016 2255   APPEARANCEUR CLOUDY (A) 07/09/2016 2255   LABSPEC 1.019 07/09/2016 2255   PHURINE 5.0 07/09/2016 2255   GLUCOSEU NEGATIVE 07/09/2016 2255   GLUCOSEU NEGATIVE 11/05/2014 1456   HGBUR LARGE (A) 07/09/2016 2255   BILIRUBINUR NEGATIVE 07/09/2016 2255   KETONESUR 15 (A) 07/09/2016 2255   PROTEINUR 30 (A) 07/09/2016 2255   UROBILINOGEN 0.2 11/05/2014 1456   NITRITE NEGATIVE 07/09/2016 2255   LEUKOCYTESUR MODERATE (A) 07/09/2016 2255     Savannah Howard M.D. [facility] 07/10/2016, 12:20 PM  Pager: [phone] Between 7am to 7pm - call Pager - [phone]  After 7pm go to [email or web address] - password [email or web address]  Call night coverage person covering after 7pm

## 2016-07-10 NOTE — ED Notes (Signed)
Phlebotomy at bedside.

## 2016-07-10 NOTE — ED Notes (Signed)
meds held till after procedure

## 2016-07-10 NOTE — Progress Notes (Signed)
Pharmacy Antibiotic Note  Savannah Howard is a 70 y.o. female admitted on 07/09/2016 with UTI.  Pharmacy has been consulted for aztreonam dosing.  Plan: Aztreonam 1g IV Q12H.  Height: 5\' 2"  (157.5 cm) Weight: 190 lb (86.2 kg) IBW/kg (Calculated) : 50.1  Temp (24hrs), Avg:98.7 F (37.1 C), Min:98.2 F (36.8 C), Max:99.2 F (37.3 C)   Recent Labs Lab 07/09/16 2339  WBC 16.3*  CREATININE 1.55*    Estimated Creatinine Clearance: 33.9 mL/min (by C-G formula based on SCr of 1.55 mg/dL (H)).    Allergies  Allergen Reactions  . Penicillins     REACTION: genital swelling and skin peeled ? yeast infection  . Sulfonamide Derivatives      Thank you for allowing pharmacy to be a part of this patient's care.  Wynona Neat, PharmD, BCPS  07/10/2016 3:27 AM

## 2016-07-10 NOTE — ED Notes (Signed)
Oncologist at bedside. 

## 2016-07-10 NOTE — H&P (Signed)
History and Physical    [name] [id number] DOB: 1945/10/09 DOA: 07/09/2016  Referring MD/NP/PA:   PCP: Walker Kehr, MD   Patient coming from:  The patient is coming from home.  At baseline, pt is independent for most of ADL.  Chief Complaint: Fall, possible syncope, increased urinary frequency  HPI: Savannah Howard is a 70 y.o. female with medical history significant of hypertension, GERD, anxiety, breast cancer (s/p of mastectomy and chemo 2016, on anastrozole), DDD, CKD-II, who presents with fall, possible syncope and increased urinary frequency.  Pt had fall yesterday. She fell against her bed and became wedged against her dresser. Per ems,  Pt was laying on the floor for nearly 20 after fall. pt can't remember why she fell, and stated she has generalized weakness and possibly passed out, thought not very sure. She states that her heart was racing. Pt complains pain in left knee, lower back and right shoulder. She also injured her left head. There is a friend here who reports she may have a new facial droop and slurred speech from her baseline condition. Pt is confused today which is abnormal for patient. when I saw patient in the ED, she is alert, and oriented x 3. She has increased urinary frequency, but no dysuria or burning on urination. Patient denies chest pain, shortness of breath, cough. She has nausea and vomited once. No abdominal pain. She had 1 loose stool bowel movement today. Currently no diarrhea.   currently no unilateral weakness, numbness or tingling sensations. No vision change or hearing loss. No fever or chill.   ED Course: pt was found to have  positive urinalysis with moderate amount of leukocytes, WBC 16.3, lactic acid 2.4-->1.6, [place] [address], temperature normal, no tachycardia, oxygen saturation 96% on room air. Chest x-ray negative. Patient had negative x-ray of left knee, lumbar, pelvis and right shoulder. CT of C-spine is negative  for acute bony abnormalities. CT head showed cystic mass within the anterior right temporal lobe with mild surrounding vasogenic edema, no midline shift or mass effect.  Pt is admitted to telemetry bed as inpatient.  Review of Systems:   General: no fevers, chills, no changes in body weight, has fatigue. HEENT: no blurry vision, hearing changes or sore throat Respiratory: no dyspnea, coughing, wheezing CV: no chest pain, no palpitations GI: no nausea, vomiting, abdominal pain, diarrhea, constipation GU: no dysuria, burning on urination, increased urinary frequency, hematuria  Ext: no leg edema Neuro:  Possible facial droop and slurred speech. Had fall and possible syncope Skin: no rash, no skin tear. MSK: No muscle spasm, no deformity, no limitation of range of movement in spin Heme: No easy bruising.  Travel history: No recent long distant travel.  Allergy:  Allergies  Allergen Reactions  . Penicillins     REACTION: genital swelling and skin peeled ? yeast infection  . Sulfonamide Derivatives     Past Medical History:  Diagnosis Date  . Allergy    rhinitis  . Anemia   . Anxiety   . DDD (degenerative disc disease), lumbar dx'd in 1980's  . GERD (gastroesophageal reflux disease)   . Heart murmur   . History of blood transfusion 2016 X 3 (05/30/2015)  . Hypertension   . Kidney stone 1990's X 1   "passed it"  . Obesity   . 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 Vitamin B 12 deficiency   . Vitamin D deficiency  Past Surgical History:  Procedure Laterality Date  . BREAST BIOPSY Left ~ 12/2014  . CHOLECYSTECTOMY OPEN  ~ 1975  . ESOPHAGEAL DILATION  X 3  . MASTECTOMY COMPLETE / SIMPLE W/ SENTINEL NODE BIOPSY Left 05/30/2015  . MASTECTOMY W/ SENTINEL NODE BIOPSY Left 05/30/2015   Procedure: LEFT MASTECTOMY WITH SENTINEL LYMPH NODE BIOPSY;  Surgeon: Autumn Messing III, MD;  Location: [facility];  Service: General;  Laterality: Left;  . PORTACATH  PLACEMENT Right 12/26/2014   Procedure: INSERTION PORT-A-CATH;  Surgeon: Autumn Messing III, MD;  Location: [facility];  Service: General;  Laterality: Right;    Social History:  reports that she has never smoked. She has never used smokeless tobacco. She reports that she drinks alcohol. She reports that she does not use drugs.  Family History:  Family History  Problem Relation Age of Onset  . Heart disease Mother     CAD  . COPD Mother   . Heart disease Father 3    leaky valve  . Parkinsonism Father   . Hypertension Other      Prior to Admission medications   Medication Sig Start Date End Date Taking? Authorizing Provider  acetaminophen (TYLENOL) 500 MG tablet Take 1,000 mg by mouth every 6 (six) hours as needed for mild pain.   Yes Historical Provider, MD  anastrozole (ARIMIDEX) 1 MG tablet Take 1 tablet (1 mg total) by mouth daily. 07/15/15  Yes Nicholas Lose, MD  cyanocobalamin (,VITAMIN B-12,) 1000 MCG/ML injection INJECT 1 MILLILITER INTRAMUSCULARLY EVERY 30 DAYS 03/30/16  Yes [name] [name], MD  LORazepam (ATIVAN) 0.5 MG tablet TAKE 1 TABLET BY MOUTH 3 TIMES A DAY AS NEEDED FOR ANXIETY 03/31/16  Yes [name] [name], MD  losartan-hydrochlorothiazide (HYZAAR) 100-25 MG tablet TAKE 1 TABLET BY MOUTH EVERY DAY 03/30/16  Yes [name] [name], MD  metoprolol succinate (TOPROL XL) 25 MG 24 hr tablet Take 1 tablet (25 mg total) by mouth daily. 11/19/15  Yes Jolaine Artist, MD  omeprazole (PRILOSEC) 20 MG capsule Take 1 capsule (20 mg total) by mouth daily. 03/28/15  Yes [name] [name], MD  potassium chloride (MICRO-K) 10 MEQ CR capsule TAKE ONE CAPSULE BY MOUTH EVERY DAY 03/30/16  Yes [name] [name], MD  terazosin (HYTRIN) 5 MG capsule TAKE 1 CAPSULE (5 MG TOTAL) BY MOUTH AT BEDTIME. 03/30/16  Yes Nicholas Lose, MD  vitamin B-12 (CYANOCOBALAMIN) 1000 MCG tablet Take 1,000 mcg by mouth daily.   Yes Historical Provider, MD    Physical Exam: Vitals:   07/10/16  0344 07/10/16 0345 07/10/16 0346 07/10/16 0400  BP:   164/78 151/84  Pulse: 81 89  83  Resp:      Temp:      TempSrc:      SpO2: 97% 93%  96%  Weight:      Height:       General: Not in acute distress HEENT:       Eyes: PERRL, EOMI, no scleral icterus.       ENT: No discharge from the ears and nose, no pharynx injection, no tonsillar enlargement.        Neck: No JVD, no bruit, no mass felt. Heme: No neck lymph node enlargement. Cardiac: S1/S2, RRR, No murmurs, No gallops or rubs. Respiratory: No rales, wheezing, rhonchi or rubs. GI: Soft, nondistended, nontender, no rebound pain, no organomegaly, BS present. GU: No hematuria Ext: No pitting leg edema bilaterally. 2+DP/PT pulse bilaterally. Musculoskeletal: No joint deformities, No joint  redness or warmth, no limitation of ROM in spin. Skin: No rashes.  Neuro: Alert, oriented X3, cranial nerves II-XII grossly intact, moves all extremities normally. Muscle strength 5/5 in all extremities, sensation to light touch intact. Brachial reflex 2+ bilaterally. Negative Babinski's sign.  Psych: Patient is not psychotic, no suicidal or hemocidal ideation.  Labs on Admission: I have personally reviewed following labs and imaging studies  CBC:  Recent Labs Lab 07/09/16 2339  WBC 16.3*  NEUTROABS 13.5*  HGB 15.3*  HCT 44.4  MCV 91.5  PLT [id number]   Basic Metabolic Panel:  Recent Labs Lab 07/09/16 2339  NA 136  K 3.6  CL 99*  CO2 21*  GLUCOSE 128*  BUN 34*  CREATININE 1.55*  CALCIUM 9.7   GFR: Estimated Creatinine Clearance: 33.9 mL/min (by C-G formula based on SCr of 1.55 mg/dL (H)). Liver Function Tests:  Recent Labs Lab 07/09/16 2339  AST 71*  ALT 22  ALKPHOS 68  BILITOT 0.9  PROT 7.6  ALBUMIN 4.1   No results for input(s): LIPASE, AMYLASE in the last 168 hours. No results for input(s): AMMONIA in the last 168 hours. Coagulation Profile: No results for input(s): INR, PROTIME in the last 168 hours. Cardiac  Enzymes:  Recent Labs Lab 07/09/16 2339 07/10/16 0420  CKTOTAL 10,370*  --   TROPONINI  --  0.78*   BNP (last 3 results) No results for input(s): PROBNP in the last 8760 hours. HbA1C: No results for input(s): HGBA1C in the last 72 hours. CBG: No results for input(s): GLUCAP in the last 168 hours. Lipid Profile: No results for input(s): CHOL, HDL, LDLCALC, TRIG, CHOLHDL, LDLDIRECT in the last 72 hours. Thyroid Function Tests: No results for input(s): TSH, T4TOTAL, FREET4, T3FREE, THYROIDAB in the last 72 hours. Anemia Panel: No results for input(s): VITAMINB12, FOLATE, FERRITIN, TIBC, IRON, RETICCTPCT in the last 72 hours. Urine analysis:    Component Value Date/Time   COLORURINE YELLOW 07/09/2016 2255   APPEARANCEUR CLOUDY (A) 07/09/2016 2255   LABSPEC 1.019 07/09/2016 2255   PHURINE 5.0 07/09/2016 2255   GLUCOSEU NEGATIVE 07/09/2016 2255   GLUCOSEU NEGATIVE 11/05/2014 1456   HGBUR LARGE (A) 07/09/2016 2255   BILIRUBINUR NEGATIVE 07/09/2016 2255   KETONESUR 15 (A) 07/09/2016 2255   PROTEINUR 30 (A) 07/09/2016 2255   UROBILINOGEN 0.2 11/05/2014 1456   NITRITE NEGATIVE 07/09/2016 2255   LEUKOCYTESUR MODERATE (A) 07/09/2016 2255   Sepsis Labs: @LABRCNTIP (procalcitonin:4,lacticidven:4) )No results found for this or any previous visit (from the past 240 hour(s)).   Radiological Exams on Admission: Dg Chest 1 View  Result Date: 07/09/2016 CLINICAL DATA:  Found on floor after fall EXAM: CHEST 1 VIEW COMPARISON:  12/31/2014 FINDINGS: A right-sided chest port is in place, the superior portion of the catheter is not included on the image. The tip of the catheter projects over expected location of SVC. Surgical clips in the left axilla and within the right upper quadrant. There is mild cardiomegaly without overt failure. Mediastinal contour is exaggerated by patient rotation. Mild atelectasis left base. No acute infiltrate. No pneumothorax. IMPRESSION: 1. Mild cardiomegaly without  overt failure 2. Mild atelectasis at the left lung base. Electronically Signed   By: Donavan Foil M.D.   On: 07/09/2016 23:38   Dg Lumbar Spine Complete  Result Date: 07/09/2016 CLINICAL DATA:  Fall extremity weakness EXAM: LUMBAR SPINE - COMPLETE 4+ VIEW COMPARISON:  None. FINDINGS: Surgical clips are present in the right upper quadrant. There are 5 non rib-bearing lumbar type  vertebra. Calcified pelvic phleboliths. Lumbar alignment within normal limits. Severe narrowing at L5-S1 with endplate changes. Moderate narrowing at L1-L2. The set degenerative changes of the lower lumbar spine. Atherosclerosis of the aorta. IMPRESSION: Degenerative changes.  No acute osseous abnormality. Electronically Signed   By: Donavan Foil M.D.   On: 07/09/2016 23:42   Dg Pelvis 1-2 Views  Result Date: 07/09/2016 CLINICAL DATA:  Found down, fall with leg weakness EXAM: PELVIS - 1-2 VIEW COMPARISON:  None. FINDINGS: SI joints are symmetric. Pubic symphysis is intact. No fracture or dislocation. IMPRESSION: No acute osseous abnormality Electronically Signed   By: Donavan Foil M.D.   On: 07/09/2016 23:45   Dg Shoulder Right  Result Date: 07/09/2016 CLINICAL DATA:  Fall EXAM: RIGHT SHOULDER - 2+ VIEW COMPARISON:  None. FINDINGS: There is no evidence of fracture or dislocation. There is no evidence of arthropathy or other focal bone abnormality. Soft tissues are unremarkable. IMPRESSION: Negative. Electronically Signed   By: Donavan Foil M.D.   On: 07/09/2016 23:45   Ct Head Wo Contrast  Result Date: 07/10/2016 CLINICAL DATA:  Status post fall with impact to posterior head. EXAM: CT HEAD WITHOUT CONTRAST CT CERVICAL SPINE WITHOUT CONTRAST TECHNIQUE: Multidetector CT imaging of the head and cervical spine was performed following the standard protocol without intravenous contrast. Multiplanar CT image reconstructions of the cervical spine were also generated. COMPARISON:  None. FINDINGS: CT HEAD FINDINGS Brain: There is  a cystic mass within the anterior right temporal lobe that measures 3.9 x 3.5 cm with adjacent vasogenic edema extending into the right temporal and lower parietal white matter. There is no midline shift or other mass effect. No hydrocephalus. No intracranial hemorrhage or evidence of acute cortical infarct. The remainder the brain parenchyma is normal. No age advanced or lobar predominant atrophy. Vascular: Atherosclerotic calcification of the vertebral and internal carotid arteries at the skullbase is present. Skull: No skull fracture or focal calvarial lesion. Visualized skull base is normal. Sinuses/Orbits: No sinus fluid levels or advanced mucosal thickening. No mastoid effusion. Normal orbits. CT CERVICAL SPINE FINDINGS Alignment: No static subluxation. Facets are aligned. Occipital condyles are normally positioned. Skull base and vertebrae: No acute fracture. Soft tissues and spinal canal: No prevertebral fluid or swelling. No visible canal hematoma. Disc levels: No advanced spinal canal or neural foraminal stenosis. Upper chest: Left subarticular predominant C5-C6 disc osteophyte complex narrows the left lateral recess and left neural foramen. Other: Normal visualized paraspinal cervical soft tissues. IMPRESSION: 1. Predominantly cystic mass within the anterior right temporal lobe with mild surrounding vasogenic edema. Given the patient's history of breast cancer, this is favored to indicate metastatic disease. A primary CNS neoplasm, such as a glioma, is also a consideration. MRI is recommended for further characterization. 2. No midline shift or mass effect. 3. No acute fracture or static subluxation of the cervical spine. Electronically Signed   By: Ulyses Jarred M.D.   On: 07/10/2016 02:23   Ct Cervical Spine Wo Contrast  Result Date: 07/10/2016 CLINICAL DATA:  Status post fall with impact to posterior head. EXAM: CT HEAD WITHOUT CONTRAST CT CERVICAL SPINE WITHOUT CONTRAST TECHNIQUE: Multidetector CT  imaging of the head and cervical spine was performed following the standard protocol without intravenous contrast. Multiplanar CT image reconstructions of the cervical spine were also generated. COMPARISON:  None. FINDINGS: CT HEAD FINDINGS Brain: There is a cystic mass within the anterior right temporal lobe that measures 3.9 x 3.5 cm with adjacent vasogenic edema extending into the  right temporal and lower parietal white matter. There is no midline shift or other mass effect. No hydrocephalus. No intracranial hemorrhage or evidence of acute cortical infarct. The remainder the brain parenchyma is normal. No age advanced or lobar predominant atrophy. Vascular: Atherosclerotic calcification of the vertebral and internal carotid arteries at the skullbase is present. Skull: No skull fracture or focal calvarial lesion. Visualized skull base is normal. Sinuses/Orbits: No sinus fluid levels or advanced mucosal thickening. No mastoid effusion. Normal orbits. CT CERVICAL SPINE FINDINGS Alignment: No static subluxation. Facets are aligned. Occipital condyles are normally positioned. Skull base and vertebrae: No acute fracture. Soft tissues and spinal canal: No prevertebral fluid or swelling. No visible canal hematoma. Disc levels: No advanced spinal canal or neural foraminal stenosis. Upper chest: Left subarticular predominant C5-C6 disc osteophyte complex narrows the left lateral recess and left neural foramen. Other: Normal visualized paraspinal cervical soft tissues. IMPRESSION: 1. Predominantly cystic mass within the anterior right temporal lobe with mild surrounding vasogenic edema. Given the patient's history of breast cancer, this is favored to indicate metastatic disease. A primary CNS neoplasm, such as a glioma, is also a consideration. MRI is recommended for further characterization. 2. No midline shift or mass effect. 3. No acute fracture or static subluxation of the cervical spine. Electronically Signed   By:  Ulyses Jarred M.D.   On: 07/10/2016 02:23   Dg Knee Complete 4 Views Left  Result Date: 07/09/2016 CLINICAL DATA:  Found on floor, fall weakness EXAM: LEFT KNEE - COMPLETE 4+ VIEW COMPARISON:  None. FINDINGS: No acute displaced fracture or dislocation. Mild narrowing of the medial and lateral joint space compartments with bony spurring. Superior and inferior patellar osteophytes. Vascular calcifications. IMPRESSION: No acute osseous abnormality Electronically Signed   By: Donavan Foil M.D.   On: 07/09/2016 23:40     EKG:  Not done in ED, will get one.   Assessment/Plan Principal Problem:   Rhabdomyolysis Active Problems:   GERD   Hypertension   Breast cancer of upper-outer quadrant of left female breast ([place])   Fall   UTI (urinary tract infection)   Syncope   Acute on chronic kidney failure-II   Sepsis ([place])   Rhabdomyolysis: [place] [address]. Has worsening renal function. Liver function normal -will admit to tele bed as inpt -IVF: 1L NS, then 125 cc/h -repeat CK in AM  Fall and syncope: Likely due to intracranial mass. CT head showed cystic mass within the anterior right temporal lobe with mild surrounding vasogenic edema, no midline shift or mass effect. It is likely due to metastasize the disease given history of breast cancer. Other differential diagnosis include stroke and cardiac arrhythmia. -will get 2d echo and carotid artery Doppler -Patient needs MRI with and without contrast to further evaluate intracranial mass and also rule out stroke. Since she has worsening renal function, I will hold MRI and hydrated her first, then MRI later today (not ordered yet). -start Decadron 4 mg every 6 hours -Please call oncology in AM. -pt/ot  GERD: -Protonix  Breast cancer of upper-outer quadrant of left female breast [name]): s/p of mastectomy and chemotherapy 2016. Currently on anastrozole -Continue anastrozole  UTI and sepsis: UA is positive for UTI. Patient has sepsis with  leukocytosis and elevated lactate 2.4 which is normalized to 1.6. Currently hemodynamically stable. -IV aztreonam -Follow-up blood culture and a urine culture -IV fluid as above  HTN: -continue metoprolol and Terazosin -Hold Hyzaar due to sepsis and rhabdomyolysis -Start amlodipine 5 mg daily -IV  hydralazine when necessary  GERD: -Protonix  AoCKD-II: Baseline Cre is 1.1-1.2 , pt's Cre is 1.55 on admission. Likely due to UTI and rhabdomyolysis - IVF as above - Follow up renal function by BMP - Hold Hyzaar  Nausea, vomiting: Etiology is not clear. Currently no abdominal pain or diarrhea. May be due to brain mass. -Check lipase -Appearing Zofran    DVT ppx: SCD Code Status: DNR (discussed with patient about CODE STATUS, patient would like to be on DO NOT RESUSCITATE) Family Communication: Yes, patient's friend at bed side Disposition Plan:  Anticipate discharge back to previous home environment Consults called: none   Admission status: Inpatient/tele   Date of Service 07/10/2016    Ivor Costa [facility] Pager [phone]  If 7PM-7AM, please contact night-coverage [email or web address] Password [facility] 07/10/2016, 5:33 AM

## 2016-07-10 NOTE — Progress Notes (Signed)
HEMATOLOGY-ONCOLOGY PROGRESS NOTE  SUBJECTIVE: I saw the patient in the emergency room. Patient lives alone and fell at her home on Wednesday. She could not get to any help for over 24 hours. Apparently she had a syncopal event. After that she had no energy and no strength. It is unclear if she may have had a seizure or syncope. She reports that she had been feeling nauseated and weak for several days. She was also having intractable headaches. She was finally able to get in touch with her family who then called EMS. In the emergency room she was noted to have rhabdomyolysis with a CK of 10,370 with renal dysfunction. CT of the head showed a cystic mass in the right temporoparietal lobe. We are consulted to assist with multidisciplinary management of Savannah Howard. She is awake alert and oriented. She is able to talk in full sentences. Her throat is very sore because she was screaming for long hours at her home. She is complaining of a sharp pain that goes from the left orbit to the left side of the head. This lasts for about 2-3 minutes and comes back periodically.     Breast cancer of upper-outer quadrant of left female breast ([place])   12/07/2014 Initial Diagnosis    Left breast 2:00 position: Invasive ductal carcinoma grade 2, ER 5%, PR 0%, Ki-67 80%, HER-2 positive ratio 6.96      12/07/2014 Mammogram    Large left breast mass 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, 2 small benign nodules in the right breast stable since 2002, other consistent with normal intramammary lymph node left axilla ultrasound normal-sized lymph nodes      12/31/2014 - 04/22/2015 Neo-Adjuvant Chemotherapy    T[facility] Perjeta neoadjuvant chemotherapy 6 (decreased chemotherapy dosage with cycle 2 and discontinued Perjeta for diarrhea)       04/29/2015 Breast MRI    Significant interval decrease in size of the mass, continued skin retraction, 4.3 x 2 x 1.8 cm (previously 4.9 x 3.9 x 3.9 cm)      05/30/2015 Surgery    Left mastectomy: Path CR, 0/6  LN Negative      09/09/2015 -  Anti-estrogen oral therapy    Anastrozole 1 mg daily       OBJECTIVE: REVIEW OF SYSTEMS:   Constitutional: Denies fevers, chills  Eyes: Denies blurriness of vision Ears, nose, mouth, throat, and face:Sore throat from screaming for long time, improvement in some slurring of speech that she had yesterday  Respiratory: Denies cough, dyspnea or wheezes Cardiovascular: Denies palpitation, chest discomfort Gastrointestinal: Nausea  Skin: Denies abnormal skin rashes Lymphatics: Denies new lymphadenopathy or easy bruising Neurological Overall generalized weakness but able to move all 4 extremities. No facial no deficits  Behavioral/Psych:Tearful  Extremities: No lower extremity edema All other systems were reviewed with the patient and are negative.  I have reviewed the past medical history, past surgical history, social history and family history with the patient and they are unchanged from previous note.   PHYSICAL EXAMINATION: ECOG PERFORMANCE STATUS: 3 - Symptomatic, >50% confined to bed  Vitals:   07/10/16 1245 07/10/16 1300  BP: 179/63 170/64  Pulse: 68 68  Resp: 14 12  Temp:     Filed Weights   07/10/16 0109  Weight: 190 lb (86.2 kg)    GENERAL:alert, no distress and comfortable SKIN: skin color, texture, turgor are normal, no rashes or significant lesions EYES: normal, Conjunctiva are pink and non-injected, sclera clear OROPHARYNX:no exudate, no erythema and lips,  buccal mucosa, and tongue normal  NECK: supple, thyroid normal size, non-tender, without nodularity LYMPH:  no palpable lymphadenopathy in the cervical, axillary or inguinal LUNGS: clear to auscultation and percussion with normal breathing effort HEART: regular rate & rhythm and no murmurs and no lower extremity edema ABDOMEN:abdomen soft, non-tender and normal bowel sounds Musculoskeletal:no cyanosis of digits and no clubbing  NEURO:Generalized weakness able to move all  extremities, no facial no deficits, some numbness on the left half of the face  LABORATORY DATA:  I have reviewed the data as listed CMP Latest Ref Rng & Units 07/09/2016 10/07/2015 09/16/2015  Glucose 65 - 99 mg/dL 128(H) 121 134  BUN 6 - 20 mg/dL 34(H) 24.0 25.4  Creatinine 0.44 - 1.00 mg/dL 1.55(H) 1.2(H) 1.4(H)  Sodium 135 - 145 mmol/L 136 141 139  Potassium 3.5 - 5.1 mmol/L 3.6 3.4(L) 3.4(L)  Chloride 101 - 111 mmol/L 99(L) - -  CO2 22 - 32 mmol/L 21(L) 22 18(L)  Calcium 8.9 - 10.3 mg/dL 9.7 10.2 9.9  Total Protein 6.5 - 8.1 g/dL 7.6 7.6 7.7  Total Bilirubin 0.3 - 1.2 mg/dL 0.9 0.35 0.39  Alkaline Phos 38 - 126 U/L 68 76 70  AST 15 - 41 U/L 71(H) 13 11  ALT 14 - 54 U/L '22 13 11    '$ Lab Results  Component Value Date   WBC 16.3 (H) 07/09/2016   HGB 15.3 (H) 07/09/2016   HCT 44.4 07/09/2016   MCV 91.5 07/09/2016   PLT 222 07/09/2016   NEUTROABS 13.5 (H) 07/09/2016    ASSESSMENT AND PLAN: 1. Right temporoparietal mass with a prior history of HER-2 positive breast cancer that was previously treated with neoadjuvant chemotherapy followed by mastectomy. She was on anastrozole therapy. I reviewed the CT scan with the patient and showed her the pictures. This is a nearly 4 cm lesion in the right side of the brain. She is showing cystic in appearance. Differential diagnosis is between a primary brain tumor versus a metastatic disease. I would be extremely surprised if this is metastatic breast cancer. 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  Plan: 1. MRI brain has been ordered 2. dexamethasone 10 mg given in the ED. I will order 4 mg IV 3 times a day 3. CT chest abdomen pelvis to rule out any other evidence of disease elsewhere. 4. Neurosurgery consultation to evaluate her options for resection or biopsy 5. Radiation may be consulted next week  2. rhabdomyolysis with creatinine of 1.36: Being treated with hydration, CK is coming down.  I will be seeing the patient  back on Monday for follow-up. Please do not stay to call oncologist on call if there are any questions or concerns.

## 2016-07-10 NOTE — ED Notes (Signed)
Report given to Amy, RN.

## 2016-07-11 ENCOUNTER — Inpatient Hospital Stay (HOSPITAL_COMMUNITY): Payer: Medicare Other

## 2016-07-11 DIAGNOSIS — R55 Syncope and collapse: Secondary | ICD-10-CM

## 2016-07-11 DIAGNOSIS — Z170 Estrogen receptor positive status [ER+]: Secondary | ICD-10-CM

## 2016-07-11 DIAGNOSIS — L8990 Pressure ulcer of unspecified site, unspecified stage: Secondary | ICD-10-CM | POA: Insufficient documentation

## 2016-07-11 LAB — ECHOCARDIOGRAM COMPLETE
E decel time: 299 msec
E/e' ratio: 9.88
FS: 41 % (ref 28–44)
Height: 62 in
IVS/LV PW RATIO, ED: 0.99
LA ID, A-P, ES: 45 mm
LA diam index: 2.41 cm/m2
LAVOL: 67.9 mL
LAVOLA4C: 69 mL
LAVOLIN: 36.3 mL/m2
LEFT ATRIUM END SYS DIAM: 45 mm
LV E/e' medial: 9.88
LV E/e'average: 9.88
LVELAT: 9.23 cm/s
LVOT area: 2.54 cm2
LVOT diameter: 18 mm
Lateral S' vel: 12.7 cm/s
MV Dec: 299
MV Peak grad: 3 mmHg
MV pk E vel: 91.2 m/s
MVPKAVEL: 107 m/s
PW: 10.9 mm — AB (ref 0.6–1.1)
RV TAPSE: 21.6 mm
TDI e' lateral: 9.23
TDI e' medial: 5.91
Weight: 3040 oz

## 2016-07-11 LAB — URINE CULTURE

## 2016-07-11 LAB — BASIC METABOLIC PANEL
ANION GAP: 9 (ref 5–15)
BUN: 31 mg/dL — ABNORMAL HIGH (ref 6–20)
CHLORIDE: 100 mmol/L — AB (ref 101–111)
CO2: 24 mmol/L (ref 22–32)
Calcium: 8.3 mg/dL — ABNORMAL LOW (ref 8.9–10.3)
Creatinine, Ser: 1.26 mg/dL — ABNORMAL HIGH (ref 0.44–1.00)
GFR calc Af Amer: 48 mL/min — ABNORMAL LOW (ref >60)
GFR, EST NON AFRICAN AMERICAN: 42 mL/min — AB (ref >60)
GLUCOSE: 145 mg/dL — AB (ref 65–99)
POTASSIUM: 3.8 mmol/L (ref 3.5–5.1)
Sodium: 133 mmol/L — ABNORMAL LOW (ref 135–145)

## 2016-07-11 LAB — CK: Total CK: 3599 U/L — ABNORMAL HIGH (ref 38–234)

## 2016-07-11 LAB — GLUCOSE, CAPILLARY: Glucose-Capillary: 140 mg/dL — ABNORMAL HIGH (ref 65–99)

## 2016-07-11 MED ORDER — IOPAMIDOL (ISOVUE-300) INJECTION 61%
INTRAVENOUS | Status: AC
  Filled 2016-07-11: qty 30

## 2016-07-11 MED ORDER — DEXTROSE 5 % IV SOLN
1.0000 g | Freq: Three times a day (TID) | INTRAVENOUS | Status: DC
  Administered 2016-07-11 – 2016-07-13 (×7): 1 g via INTRAVENOUS
  Filled 2016-07-11 (×10): qty 1

## 2016-07-11 MED ORDER — IOPAMIDOL (ISOVUE-300) INJECTION 61%
INTRAVENOUS | Status: AC
  Filled 2016-07-11: qty 100

## 2016-07-11 MED ORDER — IOPAMIDOL (ISOVUE-300) INJECTION 61%
75.0000 mL | Freq: Once | INTRAVENOUS | Status: AC | PRN
  Administered 2016-07-11: 100 mL via INTRAVENOUS

## 2016-07-11 MED ORDER — LEVETIRACETAM 750 MG PO TABS
750.0000 mg | ORAL_TABLET | Freq: Two times a day (BID) | ORAL | Status: DC
  Administered 2016-07-11 – 2016-07-16 (×9): 750 mg via ORAL
  Filled 2016-07-11 (×9): qty 1

## 2016-07-11 MED ORDER — ENSURE ENLIVE PO LIQD
237.0000 mL | Freq: Two times a day (BID) | ORAL | Status: DC
  Administered 2016-07-11 – 2016-07-16 (×6): 237 mL via ORAL
  Filled 2016-07-11 (×7): qty 237

## 2016-07-11 NOTE — Consult Note (Addendum)
CC:  Chief Complaint  Patient presents with  . Fall  . Weakness    HPI: Savannah Howard is a 70 y.o. female with a fall and possible syncopal episode.  She had a CT and MRI that showed a right temporal cystic mass.  She denies focal neurological symptoms.  PMH: Past Medical History:  Diagnosis Date  . Allergy    rhinitis  . Anemia   . Anxiety   . DDD (degenerative disc disease), lumbar dx'd in 1980's  . GERD (gastroesophageal reflux disease)   . Heart murmur   . History of blood transfusion 2016 X 3 (05/30/2015)  . Hypertension   . Kidney stone 1990's X 1   "passed it"  . Obesity   . Primary cancer of upper outer quadrant of left breast ([place]) dx'd 03/2015  . Vitamin B 12 deficiency   . Vitamin D deficiency     PSH: Past Surgical History:  Procedure Laterality Date  . BREAST BIOPSY Left ~ 12/2014  . CHOLECYSTECTOMY OPEN  ~ 1975  . ESOPHAGEAL DILATION  X 3  . MASTECTOMY COMPLETE / SIMPLE W/ SENTINEL NODE BIOPSY Left 05/30/2015  . MASTECTOMY W/ SENTINEL NODE BIOPSY Left 05/30/2015   Procedure: LEFT MASTECTOMY WITH SENTINEL LYMPH NODE BIOPSY;  Surgeon: Autumn Messing III, MD;  Location: [facility];  Service: General;  Laterality: Left;  . PORTACATH PLACEMENT Right 12/26/2014   Procedure: INSERTION PORT-A-CATH;  Surgeon: Autumn Messing III, MD;  Location: [place];  Service: General;  Laterality: Right;    SH: Social History  Substance Use Topics  . Smoking status: Never Smoker  . Smokeless tobacco: Never Used  . Alcohol use Yes     Comment: 05/30/2015 "I might take a drink of wine 1-2 times/yr"    MEDS: Prior to Admission medications   Medication Sig Start Date End Date Taking? Authorizing Provider  acetaminophen (TYLENOL) 500 MG tablet Take 1,000 mg by mouth every 6 (six) hours as needed for mild pain.   Yes Historical Provider, MD  anastrozole (ARIMIDEX) 1 MG tablet Take 1 tablet (1 mg total) by mouth daily. 07/15/15  Yes Nicholas Lose, MD  cyanocobalamin (,VITAMIN  B-12,) 1000 MCG/ML injection INJECT 1 MILLILITER INTRAMUSCULARLY EVERY 30 DAYS 03/30/16  Yes [name] [name], MD  LORazepam (ATIVAN) 0.5 MG tablet TAKE 1 TABLET BY MOUTH 3 TIMES A DAY AS NEEDED FOR ANXIETY 03/31/16  Yes [name] [name], MD  losartan-hydrochlorothiazide (HYZAAR) 100-25 MG tablet TAKE 1 TABLET BY MOUTH EVERY DAY 03/30/16  Yes [name] [name], MD  metoprolol succinate (TOPROL XL) 25 MG 24 hr tablet Take 1 tablet (25 mg total) by mouth daily. 11/19/15  Yes Jolaine Artist, MD  omeprazole (PRILOSEC) 20 MG capsule Take 1 capsule (20 mg total) by mouth daily. 03/28/15  Yes [name] [name], MD  potassium chloride (MICRO-K) 10 MEQ CR capsule TAKE ONE CAPSULE BY MOUTH EVERY DAY 03/30/16  Yes [name] [name], MD  terazosin (HYTRIN) 5 MG capsule TAKE 1 CAPSULE (5 MG TOTAL) BY MOUTH AT BEDTIME. 03/30/16  Yes Nicholas Lose, MD  vitamin B-12 (CYANOCOBALAMIN) 1000 MCG tablet Take 1,000 mcg by mouth daily.   Yes Historical Provider, MD    ALLERGY: Allergies  Allergen Reactions  . Penicillins     REACTION: genital swelling and skin peeled ? yeast infection  . Sulfonamide Derivatives     ROS: Review of Systems  Constitutional: Negative.   HENT: Negative.   Eyes: Negative.   Respiratory: Negative.  Cardiovascular: Negative.   Gastrointestinal: Negative.   Genitourinary: Negative.   Musculoskeletal: Negative.   Skin: Negative.   Neurological: Positive for loss of consciousness.  Endo/Heme/Allergies: Negative.     NEUROLOGIC EXAM: Awake, alert, oriented Memory and concentration grossly intact Speech fluent, appropriate CN grossly intact Motor exam: Upper Extremities Deltoid Bicep Tricep Grip  Right 5/5 5/5 5/5 5/5  Left 5/5 5/5 5/5 5/5   Lower Extremity IP Quad PF DF EHL  Right 5/5 5/5 5/5 5/5 5/5  Left 5/5 5/5 5/5 5/5 5/5   Sensation grossly intact to LT  IMAGING: Large right temporal cystic mass without clear solid component.  Only the cyst wall  enhances.  IMPRESSION: - 70 y.o. female with right temporal cystic mass, likely metastatic breast ca.  She is neurologically intact.  PLAN: - Surgery Monday morning - We have had a long discussion about the risks and benefits of surgery as well as alternatives.  She understands and wishes to proceed.  She also understand there will likely be radiation treatment(s) after surgery. - Steroids and Keppra

## 2016-07-11 NOTE — Progress Notes (Signed)
Initial Nutrition Assessment  DOCUMENTATION CODES:  Obesity unspecified  INTERVENTION:  Pt is requesting foods that may not be in line with Heart healthy diet. Considering circumstances, would consider allowing her more freedom with meal choices.   Ensure Enlive po BID, each supplement provides 350 kcal and 20 grams of protein  Food preferences/meal requests  NUTRITION DIAGNOSIS:  Increased nutrient needs related to wound healing, cancer and cancer related treatments as evidenced by estimated nutritional requirements for this condition.   GOAL:  Patient will meet greater than or equal to 90% of their needs  MONITOR:  PO intake, Supplement acceptance, Labs, Diet advancement  REASON FOR ASSESSMENT:  Malnutrition Screening Tool    ASSESSMENT:  70 y/o female PMHx GERD, Anxiety, HTN, Breast Cancer s/p mastectomy and chemo 2016, DDD, CKD2. Presents after falling and being on floor x 24 hrs. Also pt reports increased urinary frequency. Work up in ED reveals UTI and cystic mass in anterior right temporal lobe w/ surrounding vasogenic edema.   Pt is a fair historian. From what could gather, the pt had been eating well up until her fall. She states she went 24 hrs on the floor w/o food or drink. She further states she did not have any intake while she was being worked up and essentially went 48 hrs without intake.    She said she was 240 lbs when she was diagnosed with breast cancer. She believes she is 185 lbs now.   Per chart review, her weight loss does not appear this severe. She appears to have been 230 lbs prior to her breast cancer. She fell to ~205 lbs in September of this year and has lost another 15 since then. This is not a clinically significant loss for the time frame.    Pt was understandably in poor mood. She perked up when she heard she could have ice cream/sherbert and showed some joy. Considering her circumstances, diet advancement would be appropriate.   Labs:   Hyponatremic, CK 3,599, Glu:145 Medications: arimidex, Dexamethasone, ppi, b12   Recent Labs Lab 07/09/16 2339 07/10/16 1236 07/11/16 0517  NA 136 136 133*  K 3.6 4.0 3.8  CL 99* 103 100*  CO2 21* 23 24  BUN 34* 31* 31*  CREATININE 1.55* 1.36* 1.26*  CALCIUM 9.7 8.8* 8.3*  GLUCOSE 128* 145* 145*   Diet Order:  Diet Heart Room service appropriate? Yes; Fluid consistency: Thin  Skin:  PU stage 2 to sacrum.   Last BM:  11/30  Height:  Ht Readings from Last 1 Encounters:  07/09/16 5\' 2"  (1.575 m)   Weight:  Wt Readings from Last 1 Encounters:  07/10/16 190 lb (86.2 kg)   Wt Readings from Last 10 Encounters:  07/10/16 190 lb (86.2 kg)  04/24/16 202 lb (91.6 kg)  11/19/15 203 lb 8 oz (92.3 kg)  10/07/15 202 lb 3.2 oz (91.7 kg)  08/26/15 206 lb 14.4 oz (93.8 kg)  07/26/15 203 lb 8 oz (92.3 kg)  07/15/15 205 lb 8 oz (93.2 kg)  06/03/15 209 lb 1.6 oz (94.8 kg)  05/30/15 205 lb (93 kg)  05/22/15 205 lb 4.8 oz (93.1 kg)   Ideal Body Weight:  50 kg  BMI:  Body mass index is 34.75 kg/m.  Estimated Nutritional Needs:  Kcal:  1550-1750 (18-20 kcal/kg bw) Protein:  60-70 g (1.2-1.4 g/kg ibw) Fluid:  1.6-1.8 L fluid  EDUCATION NEEDS:  No education needs identified at this time  Burtis Junes RD, LDN, [facility]  Pager: [phone] 07/11/2016 2:49 PM

## 2016-07-11 NOTE — Progress Notes (Signed)
  Echocardiogram 2D Echocardiogram has been performed.  Savannah Howard 07/11/2016, 5:39 PM

## 2016-07-11 NOTE — Progress Notes (Signed)
Pharmacy Antibiotic Note  Savannah Howard is a 70 y.o. female admitted on 07/09/2016 with UTI.  Pharmacy has been consulted for aztreonam dosing. Her scr has trended down to 1.26 so will change the dose.   Plan: Change Aztreonam 1g IV Q8H.  Height: 5\' 2"  (157.5 cm) Weight: 190 lb (86.2 kg) IBW/kg (Calculated) : 50.1  Temp (24hrs), Avg:98.5 F (36.9 C), Min:98.2 F (36.8 C), Max:98.9 F (37.2 C)   Recent Labs Lab 07/09/16 2339 07/10/16 0108 07/10/16 0408 07/10/16 1236 07/11/16 0517  WBC 16.3*  --   --   --   --   CREATININE 1.55*  --   --  1.36* 1.26*  LATICACIDVEN  --  2.4* 1.6  --   --     Estimated Creatinine Clearance: 41.7 mL/min (by C-G formula based on SCr of 1.26 mg/dL (H)).    Allergies  Allergen Reactions  . Penicillins     REACTION: genital swelling and skin peeled ? yeast infection  . Sulfonamide Derivatives      Onnie Boer, PharmD, BCPS, AAHIVP, CPP Infectious Disease Pharmacist Pager: [phone] 07/11/2016 7:30 AM

## 2016-07-11 NOTE — Progress Notes (Signed)
VASCULAR LAB PRELIMINARY  PRELIMINARY  PRELIMINARY  PRELIMINARY  Carotid duplex completed.    Preliminary report:  1-39% ICA plaquing. Vertebral artery flow is antegrade.   Griffin Dewilde, RVT 07/11/2016, 1:54 PM

## 2016-07-11 NOTE — Evaluation (Signed)
Physical Therapy Evaluation Patient Details Name: Savannah Howard MRN: [id number] DOB: September 08, 1945 Today's Date: 07/11/2016   History of Present Illness  Pt is a 70 y/o female admitted secondary to sustaining a fall at home. PMH including but not limited to breast cancer (s/p mastectomy and chemo - 2016) and CKD.  Clinical Impression  Pt presented supine in bed with HOB elevated, awake and willing to participate in therapy session. Prio to admission, pt reported that she was independent with all functional mobility. Pt currently requires min-mod A for bed mobility and mod A for transfers. Of note, in standing pt attempted to weight shift towards her L and her L knee buckled. Pt would continue to benefit from skilled physical therapy services at this time while admitted and after d/c to address her below listed limitations in order to improve her overall safety and independence with functional mobility.     Follow Up Recommendations SNF    Equipment Recommendations  None recommended by PT    Recommendations for Other Services       Precautions / Restrictions Precautions Precautions: Fall Restrictions Weight Bearing Restrictions: No      Mobility  Bed Mobility Overal bed mobility: Needs Assistance Bed Mobility: Supine to Sit;Sit to Supine     Supine to sit: Min assist;HOB elevated Sit to supine: Mod assist   General bed mobility comments: pt required increased time, use of bed rails, min A to elevate trunk to sit EOB and mod A with bilateral LEs to return to supine  Transfers Overall transfer level: Needs assistance Equipment used: Rolling walker (2 wheeled) Transfers: Sit to/from Stand Sit to Stand: Mod assist         General transfer comment: pt required increased time, VC'ing for bilateral hand placement and mod A to achieve full standing from EOB  Ambulation/Gait             General Gait Details: NT secondary to L knee buckling with weight shifting  Stairs            Wheelchair Mobility    Modified Rankin (Stroke Patients Only)       Balance Overall balance assessment: Needs assistance Sitting-balance support: Feet supported;No upper extremity supported Sitting balance-Leahy Scale: Good     Standing balance support: During functional activity;Bilateral upper extremity supported Standing balance-Leahy Scale: Poor Standing balance comment: pt reliant on bilateral UEs on RW                             Pertinent Vitals/Pain Pain Assessment: Faces Faces Pain Scale: Hurts even more Pain Location: L LE Pain Descriptors / Indicators: Sore;Grimacing;Moaning Pain Intervention(s): Monitored during session;Repositioned    Home Living Family/patient expects to be discharged to:: Unsure                 Additional Comments: open to rehab    Prior Function Level of Independence: Independent               Hand Dominance        Extremity/Trunk Assessment   Upper Extremity Assessment: Overall WFL for tasks assessed           Lower Extremity Assessment: Generalized weakness         Communication   Communication: No difficulties  Cognition Arousal/Alertness: Awake/alert Behavior During Therapy: WFL for tasks assessed/performed Overall Cognitive Status: Within Functional Limits for tasks assessed  General Comments      Exercises     Assessment/Plan    PT Assessment Patient needs continued PT services  PT Problem List Decreased strength;Decreased range of motion;Decreased activity tolerance;Decreased balance;Decreased mobility;Decreased coordination;Decreased knowledge of use of DME;Decreased safety awareness;Pain          PT Treatment Interventions DME instruction;Gait training;Functional mobility training;Stair training;Therapeutic activities;Therapeutic exercise;Balance training;Neuromuscular re-education;Patient/family education    PT Goals (Current goals  can be found in the Care Plan section)  Acute Rehab PT Goals Patient Stated Goal: decrease pain PT Goal Formulation: With patient Time For Goal Achievement: 07/25/16 Potential to Achieve Goals: Fair    Frequency Min 3X/week   Barriers to discharge        Co-evaluation               End of Session Equipment Utilized During Treatment: Gait belt Activity Tolerance: Patient limited by fatigue Patient left: in bed;with call bell/phone within reach;with bed alarm set;with family/visitor present Nurse Communication: Mobility status         Time: 1446-1530 PT Time Calculation (min) (ACUTE ONLY): 44 min   Charges:   PT Evaluation $PT Eval Moderate Complexity: 1 Procedure PT Treatments $Therapeutic Activity: 23-37 mins   PT G CodesClearnce Sorrel Shauntelle Jamerson 07/11/2016, 4:22 PM Sherie Don, [place], DPT [phone]

## 2016-07-11 NOTE — Progress Notes (Signed)
[facility]                                                                              Patient Demographics  Savannah Howard, is a 70 y.o. female, DOB - 1946/04/10, [id number]  Admit date - 07/09/2016   Admitting Physician Ivor Costa, MD  Outpatient Primary MD for the patient is Walker Kehr, MD  Outpatient specialists:   LOS - 1  days    Chief Complaint  Patient presents with  . Fall  . Weakness       Brief summary   Savannah Howard is a 70 y.o. female with medical history significant of hypertension, GERD, anxiety, breast cancer (s/p of mastectomy and chemo 2016, on anastrozole), DDD, CKD-II, who presentede with fall, possible syncope and increased urinary frequency. Pt was found to have UTI, worsening renal function, CK 10,370. Chest x-ray negative. Patient had negative x-ray of left knee, lumbar, pelvis and right shoulder. CT of C-spine is negative for acute bony abnormalities. CT head showed cystic mass within the anterior right temporal lobe with mild surrounding vasogenic edema, no midline shift or mass effect.  Assessment & Plan     Rhabdomyolysis: [place] [address]. Has worsening renal function. Liver function normal -Continue IV fluid hydration, repeat CK and BMET  Fall and syncope: Likely due to intracranial mass. - Follow 2-D echo and carotid duplex.  - cont Decadron 4 mg every 6 hours - PT evaluation  New intracranial mass - Continue Decadron - MRI of the brain showed cystic mass in the mid superior right temporal lobe with increased peripheral enhancement favoring metastatic disease over primary CNS neoplasm. - CT of the abdomen and pelvis, chest showed evidence of rhabdomyolysis otherwise no metastasis  - Neurosurgery to follow today - Appreciate oncology recommendations  GERD: -Protonix  Breast cancer of upper-outer quadrant of left female breast [facility]): s/p of mastectomy and chemotherapy 2016. Currently on anastrozole -Continue  anastrozole  UTI and sepsis: UA is positive for UTI. Patient has sepsis with leukocytosis and elevated lactate 2.4 which is normalized to 1.6. Currently hemodynamically stable. - IV aztreonam - Follow blood cultures and urine cultures  Rhabdomyolysis with acute kidney injury on chronic kidney disease, stage II - Baseline creatinine 1.1-1.2, creatinine 1.5 on admission -CK trending down, continue gentle hydration   HTN: -continue metoprolol and Terazosin  GERD: -Protonix   Nausea, vomiting: Etiology is not clear. Currently no abdominal pain or diarrhea. May be due to brain mass. -Check lipase -Appearing Zofran  Left knee pain From the fall but no fracture or dislocation noted on the x-ray, although imaging's negative  Code Status: DO NOT RESUSCITATE DVT Prophylaxis:   SCD's Family Communication: Discussed in detail with the patient, all imaging results, lab results explained to the patient    Disposition Plan:   Time Spent in minutes   25 minutes  Procedures:    Consultants:   Oncology Neurosurgery   Antimicrobials:   Aztreonam   Medications  Scheduled Meds: . amLODipine  5 mg Oral Daily  . anastrozole  1 mg Oral Daily  . aztreonam  1 g Intravenous Q8H  . dexamethasone  4 mg Intravenous Q6H  . iopamidol      . iopamidol      . metoprolol succinate  25 mg Oral Daily  . pantoprazole  40 mg Oral Daily  . sodium chloride flush  3 mL Intravenous Q12H  . terazosin  5 mg Oral QHS  . vitamin B-12  1,000 mcg Oral Daily   Continuous Infusions: . sodium chloride 100 mL/hr at 07/11/16 0819   PRN Meds:.acetaminophen, hydrALAZINE, LORazepam, ondansetron, oxyCODONE-acetaminophen   Antibiotics   Anti-infectives    Start     Dose/Rate Route Frequency Ordered Stop   07/11/16 0800  aztreonam (AZACTAM) 1 g in dextrose 5 % 50 mL IVPB     1 g 100 mL/hr over 30 Minutes Intravenous Every 8 hours 07/11/16 0731     07/10/16 0400  aztreonam (AZACTAM) 1 g in dextrose 5  % 50 mL IVPB  Status:  Discontinued     1 g 100 mL/hr over 30 Minutes Intravenous Every 12 hours 07/10/16 0329 07/11/16 0731   07/10/16 0115  cefTRIAXone (ROCEPHIN) 1 g in dextrose 5 % 50 mL IVPB  Status:  Discontinued     1 g 100 mL/hr over 30 Minutes Intravenous  Once 07/10/16 0108 07/10/16 0214        Subjective:   Savannah Howard was seen and examined today .Feeling nauseous and somewhat anxious today   Patient denies chest pain, shortness of breath, abdominal pain, N/V/D/C, new weakness, numbess, tingling.    Objective:   Vitals:   07/10/16 1738 07/10/16 2100 07/11/16 0100 07/11/16 0602  BP: (!) 160/66 (!) 104/56 (!) 141/54 (!) 134/51  Pulse: 65 96 92 68  Resp: 20 16  16   Temp: 98.6 F (37 C) 98.4 F (36.9 C) 98.4 F (36.9 C) 98.2 F (36.8 C)  TempSrc: Axillary Axillary Axillary Oral  SpO2:  98% 92% 98%  Weight:      Height:        Intake/Output Summary (Last 24 hours) at 07/11/16 0941 Last data filed at 07/11/16 0726  Gross per 24 hour  Intake                0 ml  Output                1 ml  Net               -1 ml     Wt Readings from Last 3 Encounters:  07/10/16 86.2 kg (190 lb)  04/24/16 91.6 kg (202 lb)  11/19/15 92.3 kg (203 lb 8 oz)     Exam  General: Alert and oriented x 3, NADAnxious, nauseous   HEENT:  PERRLA, EOMI, Anicteric Sclera, mucous membranes moist.   Neck:  Cardiovascular: S1 S2 auscultated, no rubs, murmurs or gallops. Regular rate and rhythm.  Respiratory: Clear to auscultation bilaterally, no wheezing, rales or rhonchi  Gastrointestinal: Soft, nontender, nondistended, + bowel sounds  Ext: no cyanosis clubbing or edema, Pain in her left leg and knee, bruising  Neuro: AAOx3, Cr N's II- XII. Strength 5/5 upper and lower extremities Right, has difficulty lifting up the left leg due to pain in her knee and left hip due to the fall and rhabdomyolysis   Skin: No rashes  Psych:anxious , alert and oriented x3    Data Reviewed:   I have personally reviewed following labs and imaging studies  Micro Results Recent Results (from the past 240 hour(s))  Urine culture     Status:  Abnormal   Collection Time: 07/09/16 10:55 PM  Result Value Ref Range Status   Specimen Description URINE, CLEAN CATCH  Final   Special Requests NONE  Final   Culture MULTIPLE SPECIES PRESENT, SUGGEST RECOLLECTION (A)  Final   Report Status 07/11/2016 FINAL  Final    Radiology Reports Dg Chest 1 View  Result Date: 07/09/2016 CLINICAL DATA:  Found on floor after fall EXAM: CHEST 1 VIEW COMPARISON:  12/31/2014 FINDINGS: A right-sided chest port is in place, the superior portion of the catheter is not included on the image. The tip of the catheter projects over expected location of SVC. Surgical clips in the left axilla and within the right upper quadrant. There is mild cardiomegaly without overt failure. Mediastinal contour is exaggerated by patient rotation. Mild atelectasis left base. No acute infiltrate. No pneumothorax. IMPRESSION: 1. Mild cardiomegaly without overt failure 2. Mild atelectasis at the left lung base. Electronically Signed   By: Donavan Foil M.D.   On: 07/09/2016 23:38   Dg Lumbar Spine Complete  Result Date: 07/09/2016 CLINICAL DATA:  Fall extremity weakness EXAM: LUMBAR SPINE - COMPLETE 4+ VIEW COMPARISON:  None. FINDINGS: Surgical clips are present in the right upper quadrant. There are 5 non rib-bearing lumbar type vertebra. Calcified pelvic phleboliths. Lumbar alignment within normal limits. Severe narrowing at L5-S1 with endplate changes. Moderate narrowing at L1-L2. The set degenerative changes of the lower lumbar spine. Atherosclerosis of the aorta. IMPRESSION: Degenerative changes.  No acute osseous abnormality. Electronically Signed   By: Donavan Foil M.D.   On: 07/09/2016 23:42   Dg Pelvis 1-2 Views  Result Date: 07/09/2016 CLINICAL DATA:  Found down, fall with leg weakness EXAM: PELVIS - 1-2 VIEW COMPARISON:  None.  FINDINGS: SI joints are symmetric. Pubic symphysis is intact. No fracture or dislocation. IMPRESSION: No acute osseous abnormality Electronically Signed   By: Donavan Foil M.D.   On: 07/09/2016 23:45   Dg Shoulder Right  Result Date: 07/09/2016 CLINICAL DATA:  Fall EXAM: RIGHT SHOULDER - 2+ VIEW COMPARISON:  None. FINDINGS: There is no evidence of fracture or dislocation. There is no evidence of arthropathy or other focal bone abnormality. Soft tissues are unremarkable. IMPRESSION: Negative. Electronically Signed   By: Donavan Foil M.D.   On: 07/09/2016 23:45   Ct Head Wo Contrast  Result Date: 07/10/2016 CLINICAL DATA:  Status post fall with impact to posterior head. EXAM: CT HEAD WITHOUT CONTRAST CT CERVICAL SPINE WITHOUT CONTRAST TECHNIQUE: Multidetector CT imaging of the head and cervical spine was performed following the standard protocol without intravenous contrast. Multiplanar CT image reconstructions of the cervical spine were also generated. COMPARISON:  None. FINDINGS: CT HEAD FINDINGS Brain: There is a cystic mass within the anterior right temporal lobe that measures 3.9 x 3.5 cm with adjacent vasogenic edema extending into the right temporal and lower parietal white matter. There is no midline shift or other mass effect. No hydrocephalus. No intracranial hemorrhage or evidence of acute cortical infarct. The remainder the brain parenchyma is normal. No age advanced or lobar predominant atrophy. Vascular: Atherosclerotic calcification of the vertebral and internal carotid arteries at the skullbase is present. Skull: No skull fracture or focal calvarial lesion. Visualized skull base is normal. Sinuses/Orbits: No sinus fluid levels or advanced mucosal thickening. No mastoid effusion. Normal orbits. CT CERVICAL SPINE FINDINGS Alignment: No static subluxation. Facets are aligned. Occipital condyles are normally positioned. Skull base and vertebrae: No acute fracture. Soft tissues and spinal canal:  No prevertebral fluid  or swelling. No visible canal hematoma. Disc levels: No advanced spinal canal or neural foraminal stenosis. Upper chest: Left subarticular predominant C5-C6 disc osteophyte complex narrows the left lateral recess and left neural foramen. Other: Normal visualized paraspinal cervical soft tissues. IMPRESSION: 1. Predominantly cystic mass within the anterior right temporal lobe with mild surrounding vasogenic edema. Given the patient's history of breast cancer, this is favored to indicate metastatic disease. A primary CNS neoplasm, such as a glioma, is also a consideration. MRI is recommended for further characterization. 2. No midline shift or mass effect. 3. No acute fracture or static subluxation of the cervical spine. Electronically Signed   By: Ulyses Jarred M.D.   On: 07/10/2016 02:23   Ct Chest W Contrast  Result Date: 07/11/2016 CLINICAL DATA:  Status post fall, with generalized chest and abdominal pain. Initial encounter. EXAM: CT CHEST, ABDOMEN, AND PELVIS WITH CONTRAST TECHNIQUE: Multidetector CT imaging of the chest, abdomen and pelvis was performed following the standard protocol during bolus administration of intravenous contrast. CONTRAST:  186mL ISOVUE-300 IOPAMIDOL (ISOVUE-300) INJECTION 61% COMPARISON:  Lumbar spine radiographs performed 07/09/2016 FINDINGS: CT CHEST FINDINGS Cardiovascular: Diffuse coronary artery calcifications are seen. Scattered calcification is seen along the aortic arch and descending thoracic aorta. The heart remains normal in size. The great vessels are grossly unremarkable. Mediastinum/Nodes: The mediastinum is otherwise unremarkable in appearance. No mediastinal lymphadenopathy is seen. No pericardial effusion is identified. The thyroid gland is unremarkable. No axillary lymphadenopathy is seen. A 3.7 cm cyst at the left axilla may reflect a postoperative seroma, given adjacent clips. Lungs/Pleura: Minimal bibasilar atelectasis is noted. The lungs  are otherwise clear. No pleural effusion or pneumothorax is seen. No masses are identified. Musculoskeletal: No acute osseous abnormalities are identified. The visualized musculature is unremarkable in appearance. CT ABDOMEN PELVIS FINDINGS Hepatobiliary: The liver is unremarkable in appearance. The patient is status post cholecystectomy, with clips noted at the gallbladder fossa. The common bile duct remains normal in caliber. Pancreas: The pancreas is within normal limits. Spleen: The spleen is unremarkable in appearance. Adrenals/Urinary Tract: The adrenal glands are unremarkable in appearance. Mild bilateral renal atrophy is noted, with nonspecific bilateral perinephric stranding. There is no evidence of hydronephrosis. No renal or ureteral stones are identified. Stomach/Bowel: The stomach is unremarkable in appearance. The small bowel is within normal limits. The appendix is normal in caliber, without evidence of appendicitis. Scattered diverticulosis is noted along the descending and sigmoid colon, without evidence of diverticulitis. Vascular/Lymphatic: Scattered calcification is seen along the abdominal aorta and its branches. The abdominal aorta is otherwise grossly unremarkable. The inferior vena cava is grossly unremarkable. No retroperitoneal lymphadenopathy is seen. No pelvic sidewall lymphadenopathy is identified. Reproductive: The bladder is mildly distended and within normal limits. The uterus is grossly unremarkable in appearance. The ovaries are relatively symmetric. No suspicious adnexal masses are seen. A 2.4 cm right adnexal cyst is likely physiologic in nature. Other: No additional soft tissue abnormalities are seen. Musculoskeletal: No acute osseous abnormalities are identified. There is swelling at the quadriceps musculature of the proximal left thigh. Would correlate for associated bruising or any evidence of rhabdomyolysis. IMPRESSION: 1. Swelling at the quadriceps musculature of the  proximal left thigh. Would correlate for any associated bruising or evidence of rhabdomyolysis. 2. Diffuse coronary artery calcifications seen. 3. 3.7 cm cyst at the left axilla may reflect a postoperative seroma, given adjacent clips. 4. Mild bilateral renal atrophy noted. 5. Scattered diverticulosis along the descending and sigmoid colon, without evidence of diverticulitis.  6. Scattered aortic atherosclerosis. Electronically Signed   By: Garald Balding M.D.   On: 07/11/2016 04:20   Ct Cervical Spine Wo Contrast  Result Date: 07/10/2016 CLINICAL DATA:  Status post fall with impact to posterior head. EXAM: CT HEAD WITHOUT CONTRAST CT CERVICAL SPINE WITHOUT CONTRAST TECHNIQUE: Multidetector CT imaging of the head and cervical spine was performed following the standard protocol without intravenous contrast. Multiplanar CT image reconstructions of the cervical spine were also generated. COMPARISON:  None. FINDINGS: CT HEAD FINDINGS Brain: There is a cystic mass within the anterior right temporal lobe that measures 3.9 x 3.5 cm with adjacent vasogenic edema extending into the right temporal and lower parietal white matter. There is no midline shift or other mass effect. No hydrocephalus. No intracranial hemorrhage or evidence of acute cortical infarct. The remainder the brain parenchyma is normal. No age advanced or lobar predominant atrophy. Vascular: Atherosclerotic calcification of the vertebral and internal carotid arteries at the skullbase is present. Skull: No skull fracture or focal calvarial lesion. Visualized skull base is normal. Sinuses/Orbits: No sinus fluid levels or advanced mucosal thickening. No mastoid effusion. Normal orbits. CT CERVICAL SPINE FINDINGS Alignment: No static subluxation. Facets are aligned. Occipital condyles are normally positioned. Skull base and vertebrae: No acute fracture. Soft tissues and spinal canal: No prevertebral fluid or swelling. No visible canal hematoma. Disc levels:  No advanced spinal canal or neural foraminal stenosis. Upper chest: Left subarticular predominant C5-C6 disc osteophyte complex narrows the left lateral recess and left neural foramen. Other: Normal visualized paraspinal cervical soft tissues. IMPRESSION: 1. Predominantly cystic mass within the anterior right temporal lobe with mild surrounding vasogenic edema. Given the patient's history of breast cancer, this is favored to indicate metastatic disease. A primary CNS neoplasm, such as a glioma, is also a consideration. MRI is recommended for further characterization. 2. No midline shift or mass effect. 3. No acute fracture or static subluxation of the cervical spine. Electronically Signed   By: Ulyses Jarred M.D.   On: 07/10/2016 02:23   Mr Jeri Cos [id number] Contrast  Result Date: 07/10/2016 CLINICAL DATA:  70 y/o  F; brain tumor. EXAM: MRI HEAD WITHOUT AND WITH CONTRAST TECHNIQUE: Multiplanar, multiecho pulse sequences of the brain and surrounding structures were obtained without and with intravenous contrast. Brain lab protocol CONTRAST:  27mL MULTIHANCE GADOBENATE DIMEGLUMINE 529 MG/ML IV SOLN COMPARISON:  07/10/2016 CT head. FINDINGS: Mass centered in the right mid superior temporal lobe with expansion of the superior temporal gyrus measuring 49 x 39 x 31 mm (AP x ML x CC, series 11: Image 8 and 12:33). The mass demonstrates a thin peripheral rim of enhancement with diffusion restriction, a central T2 hyperintense and T1 hypointense fluid collection, thin septations, and small surrounding area of nonenhancing T2 FLAIR hyperintense signal abnormality in white matter consistent with vasogenic edema. Local mass effect effaces the temporal horn of right lateral ventricle and results in minimal right-sided uncal herniation and ambient cistern effacement. No significant midline shift. Diffusion tensor imaging demonstrates mild medial displacement of the inferior longitudinal fasciculus which abuts the inferomedial margin  of the mass (653:23). Additionally, the optic radiations are medially displaced and abut the posterior superiomedial aspect of the mass (653:26). Decreased anisotropy within the surrounding white matter tracts, optic radiations, and inferior longitudinal fasciculus is likely due to vasogenic edema. If the mass is a primary CNS tumor, then nonenhancing tumor infiltration is also possible. IMPRESSION: Cystic mass centered in the mid superior right temporal lobe with thin  peripheral enhancement. 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 There is medial displacement of the right inferior longitudinal fasciculus and right optic radiations which abut the medial aspect of tumor as described. Electronically Signed   By: Kristine Garbe M.D.   On: 07/10/2016 20:16   Ct Abdomen Pelvis W Contrast  Result Date: 07/11/2016 CLINICAL DATA:  Status post fall, with generalized chest and abdominal pain. Initial encounter. EXAM: CT CHEST, ABDOMEN, AND PELVIS WITH CONTRAST TECHNIQUE: Multidetector CT imaging of the chest, abdomen and pelvis was performed following the standard protocol during bolus administration of intravenous contrast. CONTRAST:  141mL ISOVUE-300 IOPAMIDOL (ISOVUE-300) INJECTION 61% COMPARISON:  Lumbar spine radiographs performed 07/09/2016 FINDINGS: CT CHEST FINDINGS Cardiovascular: Diffuse coronary artery calcifications are seen. Scattered calcification is seen along the aortic arch and descending thoracic aorta. The heart remains normal in size. The great vessels are grossly unremarkable. Mediastinum/Nodes: The mediastinum is otherwise unremarkable in appearance. No mediastinal lymphadenopathy is seen. No pericardial effusion is identified. The thyroid gland is unremarkable. No axillary lymphadenopathy is seen. A 3.7 cm cyst at the left axilla may reflect a postoperative seroma, given adjacent clips. Lungs/Pleura: Minimal bibasilar atelectasis is noted. The lungs are  otherwise clear. No pleural effusion or pneumothorax is seen. No masses are identified. Musculoskeletal: No acute osseous abnormalities are identified. The visualized musculature is unremarkable in appearance. CT ABDOMEN PELVIS FINDINGS Hepatobiliary: The liver is unremarkable in appearance. The patient is status post cholecystectomy, with clips noted at the gallbladder fossa. The common bile duct remains normal in caliber. Pancreas: The pancreas is within normal limits. Spleen: The spleen is unremarkable in appearance. Adrenals/Urinary Tract: The adrenal glands are unremarkable in appearance. Mild bilateral renal atrophy is noted, with nonspecific bilateral perinephric stranding. There is no evidence of hydronephrosis. No renal or ureteral stones are identified. Stomach/Bowel: The stomach is unremarkable in appearance. The small bowel is within normal limits. The appendix is normal in caliber, without evidence of appendicitis. Scattered diverticulosis is noted along the descending and sigmoid colon, without evidence of diverticulitis. Vascular/Lymphatic: Scattered calcification is seen along the abdominal aorta and its branches. The abdominal aorta is otherwise grossly unremarkable. The inferior vena cava is grossly unremarkable. No retroperitoneal lymphadenopathy is seen. No pelvic sidewall lymphadenopathy is identified. Reproductive: The bladder is mildly distended and within normal limits. The uterus is grossly unremarkable in appearance. The ovaries are relatively symmetric. No suspicious adnexal masses are seen. A 2.4 cm right adnexal cyst is likely physiologic in nature. Other: No additional soft tissue abnormalities are seen. Musculoskeletal: No acute osseous abnormalities are identified. There is swelling at the quadriceps musculature of the proximal left thigh. Would correlate for associated bruising or any evidence of rhabdomyolysis. IMPRESSION: 1. Swelling at the quadriceps musculature of the proximal  left thigh. Would correlate for any associated bruising or evidence of rhabdomyolysis. 2. Diffuse coronary artery calcifications seen. 3. 3.7 cm cyst at the left axilla may reflect a postoperative seroma, given adjacent clips. 4. Mild bilateral renal atrophy noted. 5. Scattered diverticulosis along the descending and sigmoid colon, without evidence of diverticulitis. 6. Scattered aortic atherosclerosis. Electronically Signed   By: Garald Balding M.D.   On: 07/11/2016 04:20   Dg Knee Complete 4 Views Left  Result Date: 07/09/2016 CLINICAL DATA:  Found on floor, fall weakness EXAM: LEFT KNEE - COMPLETE 4+ VIEW COMPARISON:  None. FINDINGS: No acute displaced fracture or dislocation. Mild narrowing of the medial and lateral joint space compartments with bony spurring. Superior and inferior patellar  osteophytes. Vascular calcifications. IMPRESSION: No acute osseous abnormality Electronically Signed   By: Donavan Foil M.D.   On: 07/09/2016 23:40    Lab Data:  CBC:  Recent Labs Lab 07/09/16 2339  WBC 16.3*  NEUTROABS 13.5*  HGB 15.3*  HCT 44.4  MCV 91.5  PLT [id number]   Basic Metabolic Panel:  Recent Labs Lab 07/09/16 2339 07/10/16 1236 07/11/16 0517  NA 136 136 133*  K 3.6 4.0 3.8  CL 99* 103 100*  CO2 21* 23 24  GLUCOSE 128* 145* 145*  BUN 34* 31* 31*  CREATININE 1.55* 1.36* 1.26*  CALCIUM 9.7 8.8* 8.3*   GFR: Estimated Creatinine Clearance: 41.7 mL/min (by C-G formula based on SCr of 1.26 mg/dL (H)). Liver Function Tests:  Recent Labs Lab 07/09/16 2339  AST 71*  ALT 22  ALKPHOS 68  BILITOT 0.9  PROT 7.6  ALBUMIN 4.1   No results for input(s): LIPASE, AMYLASE in the last 168 hours. No results for input(s): AMMONIA in the last 168 hours. Coagulation Profile: No results for input(s): INR, PROTIME in the last 168 hours. Cardiac Enzymes:  Recent Labs Lab 07/09/16 2339 07/10/16 0420 07/11/16 0625  CKTOTAL 10,370* 9,810* 3,599*  TROPONINI  --  0.78*  --    BNP (last  3 results) No results for input(s): PROBNP in the last 8760 hours. HbA1C: No results for input(s): HGBA1C in the last 72 hours. CBG:  Recent Labs Lab 07/10/16 0647 07/11/16 0617  GLUCAP 137* 140*   Lipid Profile: No results for input(s): CHOL, HDL, LDLCALC, TRIG, CHOLHDL, LDLDIRECT in the last 72 hours. Thyroid Function Tests: No results for input(s): TSH, T4TOTAL, FREET4, T3FREE, THYROIDAB in the last 72 hours. Anemia Panel: No results for input(s): VITAMINB12, FOLATE, FERRITIN, TIBC, IRON, RETICCTPCT in the last 72 hours. Urine analysis:    Component Value Date/Time   COLORURINE YELLOW 07/09/2016 2255   APPEARANCEUR CLOUDY (A) 07/09/2016 2255   LABSPEC 1.019 07/09/2016 2255   PHURINE 5.0 07/09/2016 2255   GLUCOSEU NEGATIVE 07/09/2016 2255   GLUCOSEU NEGATIVE 11/05/2014 1456   HGBUR LARGE (A) 07/09/2016 2255   BILIRUBINUR NEGATIVE 07/09/2016 2255   KETONESUR 15 (A) 07/09/2016 2255   PROTEINUR 30 (A) 07/09/2016 2255   UROBILINOGEN 0.2 11/05/2014 1456   NITRITE NEGATIVE 07/09/2016 2255   LEUKOCYTESUR MODERATE (A) 07/09/2016 2255     Pattrick Bady M.D. [facility] 07/11/2016, 9:41 AM  Pager: [phone] Between 7am to 7pm - call Pager - [phone]  After 7pm go to [email or web address] - password [email or web address]  Call night coverage person covering after 7pm

## 2016-07-12 ENCOUNTER — Inpatient Hospital Stay (HOSPITAL_COMMUNITY): Payer: Medicare Other

## 2016-07-12 DIAGNOSIS — N3000 Acute cystitis without hematuria: Secondary | ICD-10-CM

## 2016-07-12 DIAGNOSIS — T796XXD Traumatic ischemia of muscle, subsequent encounter: Secondary | ICD-10-CM

## 2016-07-12 LAB — TYPE AND SCREEN
ABO/RH(D): O POS
ANTIBODY SCREEN: NEGATIVE

## 2016-07-12 LAB — SURGICAL PCR SCREEN
MRSA, PCR: NEGATIVE
STAPHYLOCOCCUS AUREUS: NEGATIVE

## 2016-07-12 LAB — ABO/RH: ABO/RH(D): O POS

## 2016-07-12 MED ORDER — MANNITOL 25 % IV SOLN
100.0000 g | Freq: Once | INTRAVENOUS | Status: DC
Start: 2016-07-12 — End: 2016-07-12
  Filled 2016-07-12: qty 400

## 2016-07-12 MED ORDER — DEXAMETHASONE SODIUM PHOSPHATE 4 MG/ML IJ SOLN
2.0000 mg | Freq: Four times a day (QID) | INTRAMUSCULAR | Status: DC
  Administered 2016-07-12 – 2016-07-13 (×3): 2 mg via INTRAVENOUS
  Filled 2016-07-12 (×3): qty 1

## 2016-07-12 MED ORDER — MANNITOL 20 % IV SOLN
100.0000 g | INTRAVENOUS | Status: DC
  Filled 2016-07-12: qty 500

## 2016-07-12 MED ORDER — METOPROLOL SUCCINATE ER 25 MG PO TB24
25.0000 mg | ORAL_TABLET | Freq: Every day | ORAL | Status: DC
  Administered 2016-07-14 – 2016-07-16 (×3): 25 mg via ORAL
  Filled 2016-07-12 (×3): qty 1

## 2016-07-12 MED ORDER — VANCOMYCIN HCL IN DEXTROSE 1-5 GM/200ML-% IV SOLN
1000.0000 mg | INTRAVENOUS | Status: AC
  Administered 2016-07-13: 1000 mg via INTRAVENOUS
  Filled 2016-07-12: qty 200

## 2016-07-12 MED ORDER — LORAZEPAM 2 MG/ML IJ SOLN
0.5000 mg | Freq: Once | INTRAMUSCULAR | Status: AC
  Administered 2016-07-12: 0.5 mg via INTRAVENOUS
  Filled 2016-07-12: qty 1

## 2016-07-12 MED ORDER — SODIUM CHLORIDE 0.9% FLUSH
10.0000 mL | INTRAVENOUS | Status: DC | PRN
  Administered 2016-07-13 – 2016-07-14 (×2): 30 mL
  Administered 2016-07-14: 10 mL
  Filled 2016-07-12 (×3): qty 40

## 2016-07-12 NOTE — Progress Notes (Addendum)
Occupational Therapy Evaluation Patient Details Name: Savannah Howard MRN: [id number] DOB: 10/20/1945 Today's Date: 07/12/2016    History of Present Illness Pt is a 70 y/o female admitted secondary to sustaining a fall at home. PMH including but not limited to breast cancer (s/p mastectomy and chemo - 2016) and CKD.  CT head showed cystic mass within the anterior right temporal lobe with mild surrounding vasogenic edema, no midline shift or mass effect.. Surgery planned for 12/4.    Clinical Impression   PTA, pt living at home in apt independently. Pt improving and bale to stand step to chair with modA. Used Stedy to assist pt with mobility - nsg aware. Pt currently requires mod A for LB ADL. Anticipate surgery tomorrow. Pt will benefit from rehab at SNF. Will follow acutely.    Follow Up Recommendations  SNF;Supervision/Assistance - 24 hour    Equipment Recommendations  None recommended by OT    Recommendations for Other Services       Precautions / Restrictions Precautions Precautions: Fall Restrictions Weight Bearing Restrictions: No      Mobility Bed Mobility Overal bed mobility: Needs Assistance Bed Mobility: Supine to Sit     Supine to sit: Min assist;HOB elevated     General bed mobility comments: Assist to move LLE off bed  Transfers Overall transfer level: Needs assistance   Transfers: Sit to/from Stand;Stand Pivot Transfers Sit to Stand: Min assist              Balance     Sitting balance-Leahy Scale: Fair       Standing balance-Leahy Scale: Poor                              ADL Overall ADL's : Needs assistance/impaired     Grooming: Set up   Upper Body Bathing: Set up;Sitting   Lower Body Bathing: Moderate assistance;Sit to/from stand   Upper Body Dressing : Set up;Supervision/safety;Sitting   Lower Body Dressing: Moderate assistance;Sit to/from stand   Toilet Transfer: Moderate assistance;Stand-pivot   Toileting-  Clothing Manipulation and Hygiene: Maximal assistance       Functional mobility during ADLs: Minimal assistance;Moderate assistance (with use of Stedy) General ADL Comments: Pt able to stand pivot to chair with mod A. L knee buckling. Used stedy and did well.     Vision Vision Assessment?:  (will further assess)   Perception     Praxis Praxis Praxis tested?: Deficits Deficits: Organization    Pertinent Vitals/Pain Pain Assessment: Faces Faces Pain Scale: Hurts little more Pain Location: LLE Pain Descriptors / Indicators: Discomfort Pain Intervention(s): Limited activity within patient's tolerance     Hand Dominance Right   Extremity/Trunk Assessment Upper Extremity Assessment Upper Extremity Assessment: Generalized weakness   Lower Extremity Assessment Lower Extremity Assessment: LLE deficits/detail LLE Deficits / Details: L LE stronger today per pt. Able to lift LLE off bed. L knee buckling in standing   Cervical / Trunk Assessment Cervical / Trunk Assessment: Normal   Communication Communication Communication: No difficulties   Cognition Arousal/Alertness: Awake/alert Behavior During Therapy: WFL for tasks assessed/performed Overall Cognitive Status: History of cognitive impairments - at baseline       Memory: Decreased short-term memory             General Comments       Exercises       Shoulder Instructions      Home Living Family/patient expects to be  discharged to:: Skilled nursing facility                                        Prior Functioning/Environment Level of Independence: Independent                 OT Problem List: Decreased strength;Decreased activity tolerance;Impaired balance (sitting and/or standing);Decreased cognition;Decreased safety awareness;Decreased knowledge of use of DME or AE;Obesity;Pain   OT Treatment/Interventions: Self-care/ADL training;Therapeutic exercise;Energy conservation;DME and/or  AE instruction;Therapeutic activities;Cognitive remediation/compensation;Patient/family education;Balance training    OT Goals(Current goals can be found in the care plan section) Acute Rehab OT Goals Patient Stated Goal: to be independent again OT Goal Formulation: With patient Time For Goal Achievement: 07/26/16 Potential to Achieve Goals: Good ADL Goals Pt Will Perform Upper Body Bathing: with set-up;with supervision;sitting Pt Will Perform Lower Body Bathing: with min guard assist;sit to/from stand Pt Will Perform Upper Body Dressing: with set-up;with supervision;sitting Pt Will Transfer to Toilet: with supervision;bedside commode;ambulating Pt Will Perform Toileting - Clothing Manipulation and hygiene: with supervision;sit to/from stand Additional ADL Goal #1: Pt will demosntrate anticipatory awareness during ADL   OT Frequency: Min 2X/week   Barriers to D/C:            Co-evaluation              End of Session Equipment Utilized During Treatment: Gait belt;Rolling walker;Other (comment) (stedy) Nurse Communication: Mobility status;Need for lift equipment (stedy)  Activity Tolerance: Patient tolerated treatment well Patient left: in chair;with call bell/phone within reach;with family/visitor present;with chair alarm set   Time: [id number] OT Time Calculation (min): 29 min Charges:  OT General Charges $OT Visit: 1 Procedure OT Evaluation $OT Eval Moderate Complexity: 1 Procedure OT Treatments $Self Care/Home Management : 8-22 mins G-Codes:    Sanyah Molnar,HILLARY July 27, 2016, 2:30 PM   [facility] OTR/L  [phone] 07-27-2016

## 2016-07-12 NOTE — Progress Notes (Signed)
Agitation last night; probably due to steroids Awake and alert Oriented Moving all extremities well We had a long discussion including her friend and caretaker about the risks and benefits and alternatives to surgery.  She understands and wishes to proceed.  Will do this in the morning. Reduce steroids to 2q6.

## 2016-07-12 NOTE — Progress Notes (Addendum)
PROGRESS NOTE    Savannah Howard  [id number] DOB: 27-Nov-1945 DOA: 07/09/2016 PCP: Walker Kehr, MD   Brief Narrative: 70 y.o.femalewith medical history significant of hypertension, GERD, anxiety, breast cancer (s/p of mastectomy and chemo 2016, on anastrozole), DDD, CKD-II, whopresentede with fall, possible syncope and increased urinary frequency. Pt was found to have UTI, worsening renal function, CK 10,370. Chest x-ray negative. Patient had negative x-ray of left knee, lumbar, pelvis and right shoulder. CT of C-spine is negative for acute bony abnormalities. CT head showed cystic mass within the anterior right temporal lobe with mild surrounding vasogenic edema, no midline shift or mass effect.  Assessment & Plan:   # New intracranial mass: Evaluated by neurosurgery. The dose of Decadron reduced to 2 mg today. MRI of the brain favoring metastatic disease versus primary CNS neoplasm. Patient likely go for surgical intervention tomorrow, defer to surgery team. On Keppra.  # Rhabdomyolysis: Likely traumatic in the setting of fall. CK level trending down with IV fluid. Continue IV fluid and monitor labs.  #Fall and syncope likely in the setting of intracranial mass: Carotid duplex unremarkable. Echo showed normal systolic function. PT OT evaluation.  #Sepsis due to UTI, likely acute cystitis without hematuria: Currently on IV aztreonam. Follow up culture results.  # Hypertension: Patient with low blood pressure today therefore Norvasc was discontinued. Currently on metoprolol. Continue to monitor. -Patient has asymptomatic bradycardia, change metoprolol with parameters.     #Breast cancer of upper-outer quadrant of left female breast [facility]): On anastrozole. Follow-up with oncologist.  #Left knee pain likely due to fall. X-ray with no fracture. Continue supportive care.  #Leukocytosis likely due to steroid. Monitor.  #Acute encephalopathy likely due to brain mass versus metabolic  encephalopathy: Continue to monitor    Fall   UTI (urinary tract infection)   Syncope   Acute on chronic kidney failure-II   Sepsis ([place])   Pressure injury of skin  DVT prophylaxis: SCD. No anticoagulation because of brain mass Code Status: DO NOT RESUSCITATE Family Communication: Patient's closest friend and caregiver at bedside Disposition Plan: Likely discharge to rehabilitation versus home in 2- 3 days.  Consultants:   Neurosurgery  Procedures: Echo Antimicrobials: Aztreonam  Subjective: Patient was seen and examined at bedside. Patient denied headache, dizziness, nausea, vomiting, chest pain or shortness of breath. No new event. Patient's friend at bedside.   Objective: Vitals:   07/11/16 2254 07/12/16 0200 07/12/16 0314 07/12/16 1041  BP:  (!) 88/70  (!) 140/44  Pulse:  (!) 55  (!) 50  Resp:  18  19  Temp: 98.9 F (37.2 C) 97.8 F (36.6 C)  97.8 F (36.6 C)  TempSrc: Oral Oral  Oral  SpO2:  98%  91%  Weight:   94.6 kg (208 lb 9 oz)   Height:        Intake/Output Summary (Last 24 hours) at 07/12/16 1141 Last data filed at 07/12/16 0600  Gross per 24 hour  Intake              220 ml  Output                0 ml  Net              220 ml   Filed Weights   07/10/16 0109 07/12/16 0314  Weight: 86.2 kg (190 lb) 94.6 kg (208 lb 9 oz)    Examination:  General exam: Appears calm and comfortable  Respiratory system: Clear to auscultation. Respiratory  effort normal. No wheezing or crackle Cardiovascular system: S1 & S2 heard, RRR.  No pedal edema. Gastrointestinal system: Abdomen is nondistended, soft and nontender. Normal bowel sounds heard. Central nervous system: Alert awake and following commands Extremities: Symmetric 5 x 5 power. Skin: No rashes, lesions or ulcers Psychiatry: Judgement and insight appear impaired     Data Reviewed: I have personally reviewed following labs and imaging studies  CBC:  Recent Labs Lab 07/09/16 2339  WBC 16.3*    NEUTROABS 13.5*  HGB 15.3*  HCT 44.4  MCV 91.5  PLT [id number]   Basic Metabolic Panel:  Recent Labs Lab 07/09/16 2339 07/10/16 1236 07/11/16 0517  NA 136 136 133*  K 3.6 4.0 3.8  CL 99* 103 100*  CO2 21* 23 24  GLUCOSE 128* 145* 145*  BUN 34* 31* 31*  CREATININE 1.55* 1.36* 1.26*  CALCIUM 9.7 8.8* 8.3*   GFR: Estimated Creatinine Clearance: 43.9 mL/min (by C-G formula based on SCr of 1.26 mg/dL (H)). Liver Function Tests:  Recent Labs Lab 07/09/16 2339  AST 71*  ALT 22  ALKPHOS 68  BILITOT 0.9  PROT 7.6  ALBUMIN 4.1   No results for input(s): LIPASE, AMYLASE in the last 168 hours. No results for input(s): AMMONIA in the last 168 hours. Coagulation Profile: No results for input(s): INR, PROTIME in the last 168 hours. Cardiac Enzymes:  Recent Labs Lab 07/09/16 2339 07/10/16 0420 07/11/16 0625  CKTOTAL 10,370* 9,810* 3,599*  TROPONINI  --  0.78*  --    BNP (last 3 results) No results for input(s): PROBNP in the last 8760 hours. HbA1C: No results for input(s): HGBA1C in the last 72 hours. CBG:  Recent Labs Lab 07/10/16 0647 07/11/16 0617  GLUCAP 137* 140*   Lipid Profile: No results for input(s): CHOL, HDL, LDLCALC, TRIG, CHOLHDL, LDLDIRECT in the last 72 hours. Thyroid Function Tests: No results for input(s): TSH, T4TOTAL, FREET4, T3FREE, THYROIDAB in the last 72 hours. Anemia Panel: No results for input(s): VITAMINB12, FOLATE, FERRITIN, TIBC, IRON, RETICCTPCT in the last 72 hours. Sepsis Labs:  Recent Labs Lab 07/10/16 0108 07/10/16 0408  LATICACIDVEN 2.4* 1.6    Recent Results (from the past 240 hour(s))  Urine culture     Status: Abnormal   Collection Time: 07/09/16 10:55 PM  Result Value Ref Range Status   Specimen Description URINE, CLEAN CATCH  Final   Special Requests NONE  Final   Culture MULTIPLE SPECIES PRESENT, SUGGEST RECOLLECTION (A)  Final   Report Status 07/11/2016 FINAL  Final         Radiology Studies: Ct Chest W  Contrast  Result Date: 07/11/2016 CLINICAL DATA:  Status post fall, with generalized chest and abdominal pain. Initial encounter. EXAM: CT CHEST, ABDOMEN, AND PELVIS WITH CONTRAST TECHNIQUE: Multidetector CT imaging of the chest, abdomen and pelvis was performed following the standard protocol during bolus administration of intravenous contrast. CONTRAST:  144mL ISOVUE-300 IOPAMIDOL (ISOVUE-300) INJECTION 61% COMPARISON:  Lumbar spine radiographs performed 07/09/2016 FINDINGS: CT CHEST FINDINGS Cardiovascular: Diffuse coronary artery calcifications are seen. Scattered calcification is seen along the aortic arch and descending thoracic aorta. The heart remains normal in size. The great vessels are grossly unremarkable. Mediastinum/Nodes: The mediastinum is otherwise unremarkable in appearance. No mediastinal lymphadenopathy is seen. No pericardial effusion is identified. The thyroid gland is unremarkable. No axillary lymphadenopathy is seen. A 3.7 cm cyst at the left axilla may reflect a postoperative seroma, given adjacent clips. Lungs/Pleura: Minimal bibasilar atelectasis is noted. The lungs are  otherwise clear. No pleural effusion or pneumothorax is seen. No masses are identified. Musculoskeletal: No acute osseous abnormalities are identified. The visualized musculature is unremarkable in appearance. CT ABDOMEN PELVIS FINDINGS Hepatobiliary: The liver is unremarkable in appearance. The patient is status post cholecystectomy, with clips noted at the gallbladder fossa. The common bile duct remains normal in caliber. Pancreas: The pancreas is within normal limits. Spleen: The spleen is unremarkable in appearance. Adrenals/Urinary Tract: The adrenal glands are unremarkable in appearance. Mild bilateral renal atrophy is noted, with nonspecific bilateral perinephric stranding. There is no evidence of hydronephrosis. No renal or ureteral stones are identified. Stomach/Bowel: The stomach is unremarkable in appearance.  The small bowel is within normal limits. The appendix is normal in caliber, without evidence of appendicitis. Scattered diverticulosis is noted along the descending and sigmoid colon, without evidence of diverticulitis. Vascular/Lymphatic: Scattered calcification is seen along the abdominal aorta and its branches. The abdominal aorta is otherwise grossly unremarkable. The inferior vena cava is grossly unremarkable. No retroperitoneal lymphadenopathy is seen. No pelvic sidewall lymphadenopathy is identified. Reproductive: The bladder is mildly distended and within normal limits. The uterus is grossly unremarkable in appearance. The ovaries are relatively symmetric. No suspicious adnexal masses are seen. A 2.4 cm right adnexal cyst is likely physiologic in nature. Other: No additional soft tissue abnormalities are seen. Musculoskeletal: No acute osseous abnormalities are identified. There is swelling at the quadriceps musculature of the proximal left thigh. Would correlate for associated bruising or any evidence of rhabdomyolysis. IMPRESSION: 1. Swelling at the quadriceps musculature of the proximal left thigh. Would correlate for any associated bruising or evidence of rhabdomyolysis. 2. Diffuse coronary artery calcifications seen. 3. 3.7 cm cyst at the left axilla may reflect a postoperative seroma, given adjacent clips. 4. Mild bilateral renal atrophy noted. 5. Scattered diverticulosis along the descending and sigmoid colon, without evidence of diverticulitis. 6. Scattered aortic atherosclerosis. Electronically Signed   By: Garald Balding M.D.   On: 07/11/2016 04:20   Mr Jeri Cos [id number] Contrast  Result Date: 07/10/2016 CLINICAL DATA:  70 y/o  F; brain tumor. EXAM: MRI HEAD WITHOUT AND WITH CONTRAST TECHNIQUE: Multiplanar, multiecho pulse sequences of the brain and surrounding structures were obtained without and with intravenous contrast. Brain lab protocol CONTRAST:  52mL MULTIHANCE GADOBENATE DIMEGLUMINE 529 MG/ML  IV SOLN COMPARISON:  07/10/2016 CT head. FINDINGS: Mass centered in the right mid superior temporal lobe with expansion of the superior temporal gyrus measuring 49 x 39 x 31 mm (AP x ML x CC, series 11: Image 8 and 12:33). The mass demonstrates a thin peripheral rim of enhancement with diffusion restriction, a central T2 hyperintense and T1 hypointense fluid collection, thin septations, and small surrounding area of nonenhancing T2 FLAIR hyperintense signal abnormality in white matter consistent with vasogenic edema. Local mass effect effaces the temporal horn of right lateral ventricle and results in minimal right-sided uncal herniation and ambient cistern effacement. No significant midline shift. Diffusion tensor imaging demonstrates mild medial displacement of the inferior longitudinal fasciculus which abuts the inferomedial margin of the mass (653:23). Additionally, the optic radiations are medially displaced and abut the posterior superiomedial aspect of the mass (653:26). Decreased anisotropy within the surrounding white matter tracts, optic radiations, and inferior longitudinal fasciculus is likely due to vasogenic edema. If the mass is a primary CNS tumor, then nonenhancing tumor infiltration is also possible. IMPRESSION: Cystic mass centered in the mid superior right temporal lobe with thin peripheral enhancement. Signal characteristics favor metastatic disease over primary  CNS neoplasm. There is medial displacement of the right inferior longitudinal fasciculus and right optic radiations which abut the medial aspect of tumor as described. Electronically Signed   By: Kristine Garbe M.D.   On: 07/10/2016 20:16   Ct Abdomen Pelvis W Contrast  Result Date: 07/11/2016 CLINICAL DATA:  Status post fall, with generalized chest and abdominal pain. Initial encounter. EXAM: CT CHEST, ABDOMEN, AND PELVIS WITH CONTRAST TECHNIQUE: Multidetector CT imaging of the chest, abdomen and pelvis was performed  following the standard protocol during bolus administration of intravenous contrast. CONTRAST:  153mL ISOVUE-300 IOPAMIDOL (ISOVUE-300) INJECTION 61% COMPARISON:  Lumbar spine radiographs performed 07/09/2016 FINDINGS: CT CHEST FINDINGS Cardiovascular: Diffuse coronary artery calcifications are seen. Scattered calcification is seen along the aortic arch and descending thoracic aorta. The heart remains normal in size. The great vessels are grossly unremarkable. Mediastinum/Nodes: The mediastinum is otherwise unremarkable in appearance. No mediastinal lymphadenopathy is seen. No pericardial effusion is identified. The thyroid gland is unremarkable. No axillary lymphadenopathy is seen. A 3.7 cm cyst at the left axilla may reflect a postoperative seroma, given adjacent clips. Lungs/Pleura: Minimal bibasilar atelectasis is noted. The lungs are otherwise clear. No pleural effusion or pneumothorax is seen. No masses are identified. Musculoskeletal: No acute osseous abnormalities are identified. The visualized musculature is unremarkable in appearance. CT ABDOMEN PELVIS FINDINGS Hepatobiliary: The liver is unremarkable in appearance. The patient is status post cholecystectomy, with clips noted at the gallbladder fossa. The common bile duct remains normal in caliber. Pancreas: The pancreas is within normal limits. Spleen: The spleen is unremarkable in appearance. Adrenals/Urinary Tract: The adrenal glands are unremarkable in appearance. Mild bilateral renal atrophy is noted, with nonspecific bilateral perinephric stranding. There is no evidence of hydronephrosis. No renal or ureteral stones are identified. Stomach/Bowel: The stomach is unremarkable in appearance. The small bowel is within normal limits. The appendix is normal in caliber, without evidence of appendicitis. Scattered diverticulosis is noted along the descending and sigmoid colon, without evidence of diverticulitis. Vascular/Lymphatic: Scattered calcification is  seen along the abdominal aorta and its branches. The abdominal aorta is otherwise grossly unremarkable. The inferior vena cava is grossly unremarkable. No retroperitoneal lymphadenopathy is seen. No pelvic sidewall lymphadenopathy is identified. Reproductive: The bladder is mildly distended and within normal limits. The uterus is grossly unremarkable in appearance. The ovaries are relatively symmetric. No suspicious adnexal masses are seen. A 2.4 cm right adnexal cyst is likely physiologic in nature. Other: No additional soft tissue abnormalities are seen. Musculoskeletal: No acute osseous abnormalities are identified. There is swelling at the quadriceps musculature of the proximal left thigh. Would correlate for associated bruising or any evidence of rhabdomyolysis. IMPRESSION: 1. Swelling at the quadriceps musculature of the proximal left thigh. Would correlate for any associated bruising or evidence of rhabdomyolysis. 2. Diffuse coronary artery calcifications seen. 3. 3.7 cm cyst at the left axilla may reflect a postoperative seroma, given adjacent clips. 4. Mild bilateral renal atrophy noted. 5. Scattered diverticulosis along the descending and sigmoid colon, without evidence of diverticulitis. 6. Scattered aortic atherosclerosis. Electronically Signed   By: Garald Balding M.D.   On: 07/11/2016 04:20        Scheduled Meds: . anastrozole  1 mg Oral Daily  . aztreonam  1 g Intravenous Q8H  . dexamethasone  2 mg Intravenous Q6H  . feeding supplement (ENSURE ENLIVE)  237 mL Oral BID BM  . levETIRAcetam  750 mg Oral BID  . [START ON 07/13/2016] metoprolol succinate  25 mg Oral  Daily  . pantoprazole  40 mg Oral Daily  . sodium chloride flush  3 mL Intravenous Q12H  . terazosin  5 mg Oral QHS  . vitamin B-12  1,000 mcg Oral Daily   Continuous Infusions: . sodium chloride 100 mL/hr at 07/11/16 0819     LOS: 2 days    Faviola Klare Tanna Furry, MD [facility] Pager [phone]  If 7PM-7AM,  please contact night-coverage [email or web address] Password [email or web address] 07/12/2016, 11:41 AM
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
## 2016-07-13 ENCOUNTER — Encounter (HOSPITAL_COMMUNITY)
Admission: EM | Disposition: A | Discharge status: Skilled Nursing Facility | Payer: Self-pay | Source: Home / Self Care | Attending: Internal Medicine

## 2016-07-13 ENCOUNTER — Encounter (HOSPITAL_COMMUNITY): Payer: Self-pay | Admitting: Certified Registered"

## 2016-07-13 ENCOUNTER — Inpatient Hospital Stay (HOSPITAL_COMMUNITY): Payer: Medicare Other | Admitting: Anesthesiology

## 2016-07-13 HISTORY — PX: APPLICATION OF CRANIAL NAVIGATION: SHX6578

## 2016-07-13 HISTORY — PX: PR DURAL GRAFT REPAIR,SPINE DEFECT: 63710

## 2016-07-13 LAB — VAS US CAROTID
LCCADDIAS: 21 cm/s
LCCADSYS: 105 cm/s
LEFT ECA DIAS: 9 cm/s
LEFT VERTEBRAL DIAS: 15 cm/s
LICADDIAS: -16 cm/s
LICAPDIAS: -14 cm/s
LICAPSYS: -101 cm/s
Left CCA prox dias: 19 cm/s
Left CCA prox sys: 116 cm/s
Left ICA dist sys: -62 cm/s
RCCAPDIAS: 16 cm/s
RCCAPSYS: 86 cm/s
RIGHT ECA DIAS: -13 cm/s
RIGHT VERTEBRAL DIAS: 11 cm/s
Right cca dist sys: -63 cm/s

## 2016-07-13 LAB — BASIC METABOLIC PANEL
ANION GAP: 8 (ref 5–15)
BUN: 36 mg/dL — ABNORMAL HIGH (ref 6–20)
CALCIUM: 8.2 mg/dL — AB (ref 8.9–10.3)
CO2: 24 mmol/L (ref 22–32)
Chloride: 103 mmol/L (ref 101–111)
Creatinine, Ser: 1.14 mg/dL — ABNORMAL HIGH (ref 0.44–1.00)
GFR, EST AFRICAN AMERICAN: 55 mL/min — AB (ref >60)
GFR, EST NON AFRICAN AMERICAN: 47 mL/min — AB (ref >60)
Glucose, Bld: 132 mg/dL — ABNORMAL HIGH (ref 65–99)
POTASSIUM: 4 mmol/L (ref 3.5–5.1)
SODIUM: 135 mmol/L (ref 135–145)

## 2016-07-13 LAB — GLUCOSE, CAPILLARY: GLUCOSE-CAPILLARY: 139 mg/dL — AB (ref 65–99)

## 2016-07-13 LAB — CK: CK TOTAL: 2244 U/L — AB (ref 38–234)

## 2016-07-13 SURGERY — COMPUTER-ASSISTED NAVIGATION, FOR CRANIAL PROCEDURE
Anesthesia: General

## 2016-07-13 MED ORDER — LABETALOL HCL 5 MG/ML IV SOLN
INTRAVENOUS | Status: AC
  Filled 2016-07-13: qty 4

## 2016-07-13 MED ORDER — LIDOCAINE-EPINEPHRINE (PF) 2 %-1:200000 IJ SOLN
INTRAMUSCULAR | Status: AC
  Filled 2016-07-13: qty 20

## 2016-07-13 MED ORDER — LIDOCAINE 2% (20 MG/ML) 5 ML SYRINGE
INTRAMUSCULAR | Status: AC
  Filled 2016-07-13: qty 5

## 2016-07-13 MED ORDER — METOCLOPRAMIDE HCL 5 MG/ML IJ SOLN: 10.0000 mg | Freq: Once | INTRAMUSCULAR | Status: DC | PRN

## 2016-07-13 MED ORDER — LABETALOL HCL 5 MG/ML IV SOLN
10.0000 mg | INTRAVENOUS | Status: DC | PRN
  Administered 2016-07-13: 10 mg via INTRAVENOUS

## 2016-07-13 MED ORDER — SODIUM CHLORIDE 0.9 % IV SOLN: INTRAVENOUS | Status: DC

## 2016-07-13 MED ORDER — SODIUM CHLORIDE 0.9 % IV SOLN
INTRAVENOUS | Status: DC | PRN
  Administered 2016-07-13: 07:00:00 via INTRAVENOUS

## 2016-07-13 MED ORDER — DEXAMETHASONE SODIUM PHOSPHATE 10 MG/ML IJ SOLN
INTRAMUSCULAR | Status: AC
  Filled 2016-07-13: qty 1

## 2016-07-13 MED ORDER — ONDANSETRON HCL 4 MG/2ML IJ SOLN: 4.0000 mg | INTRAMUSCULAR | Status: DC | PRN

## 2016-07-13 MED ORDER — LIDOCAINE HCL (CARDIAC) 20 MG/ML IV SOLN: INTRAVENOUS | Status: DC | PRN

## 2016-07-13 MED ORDER — MIDAZOLAM HCL 2 MG/2ML IJ SOLN
INTRAMUSCULAR | Status: AC
  Filled 2016-07-13: qty 2

## 2016-07-13 MED ORDER — FENTANYL CITRATE (PF) 100 MCG/2ML IJ SOLN
INTRAMUSCULAR | Status: AC
  Filled 2016-07-13: qty 4

## 2016-07-13 MED ORDER — FENTANYL CITRATE (PF) 100 MCG/2ML IJ SOLN: INTRAMUSCULAR | Status: DC | PRN

## 2016-07-13 MED ORDER — HEMOSTATIC AGENTS (NO CHARGE) OPTIME
TOPICAL | Status: DC | PRN
  Administered 2016-07-13: 1 via TOPICAL

## 2016-07-13 MED ORDER — NALOXONE HCL 0.4 MG/ML IJ SOLN: 0.0800 mg | INTRAMUSCULAR | Status: DC | PRN

## 2016-07-13 MED ORDER — FENTANYL CITRATE (PF) 100 MCG/2ML IJ SOLN
INTRAMUSCULAR | Status: AC
  Filled 2016-07-13: qty 2

## 2016-07-13 MED ORDER — SODIUM CHLORIDE 0.9 % IV SOLN
750.0000 mg | INTRAVENOUS | Status: AC
  Administered 2016-07-13: 750 mg via INTRAVENOUS
  Filled 2016-07-13: qty 7.5

## 2016-07-13 MED ORDER — HYDRALAZINE HCL 20 MG/ML IJ SOLN
5.0000 mg | INTRAMUSCULAR | Status: DC | PRN
  Administered 2016-07-13: 5 mg via INTRAVENOUS
  Administered 2016-07-14 (×2): 10 mg via INTRAVENOUS
  Filled 2016-07-13 (×2): qty 1

## 2016-07-13 MED ORDER — BACITRACIN ZINC 500 UNIT/GM EX OINT
TOPICAL_OINTMENT | CUTANEOUS | Status: AC
  Filled 2016-07-13: qty 28.35

## 2016-07-13 MED ORDER — ROCURONIUM BROMIDE 100 MG/10ML IV SOLN
INTRAVENOUS | Status: DC | PRN
  Administered 2016-07-13: 60 mg via INTRAVENOUS
  Administered 2016-07-13: 20 mg via INTRAVENOUS

## 2016-07-13 MED ORDER — FUROSEMIDE 10 MG/ML IJ SOLN
INTRAMUSCULAR | Status: DC | PRN
  Administered 2016-07-13: 10 mg via INTRAMUSCULAR

## 2016-07-13 MED ORDER — FLEET ENEMA 7-19 GM/118ML RE ENEM: 1.0000 | ENEMA | Freq: Once | RECTAL | Status: DC | PRN

## 2016-07-13 MED ORDER — BISACODYL 5 MG PO TBEC: 5.0000 mg | DELAYED_RELEASE_TABLET | Freq: Every day | ORAL | Status: DC | PRN

## 2016-07-13 MED ORDER — ROCURONIUM BROMIDE 10 MG/ML (PF) SYRINGE
PREFILLED_SYRINGE | INTRAVENOUS | Status: AC
  Filled 2016-07-13: qty 10

## 2016-07-13 MED ORDER — BACITRACIN ZINC 500 UNIT/GM EX OINT
TOPICAL_OINTMENT | CUTANEOUS | Status: DC | PRN
  Administered 2016-07-13: 1 via TOPICAL

## 2016-07-13 MED ORDER — SUGAMMADEX SODIUM 200 MG/2ML IV SOLN
INTRAVENOUS | Status: DC | PRN
  Administered 2016-07-13: 189.2 mg via INTRAVENOUS

## 2016-07-13 MED ORDER — BUPIVACAINE HCL 0.5 % IJ SOLN
INTRAMUSCULAR | Status: DC | PRN
  Administered 2016-07-13: 25 mL

## 2016-07-13 MED ORDER — MEPERIDINE HCL 25 MG/ML IJ SOLN: 6.2500 mg | INTRAMUSCULAR | Status: DC | PRN

## 2016-07-13 MED ORDER — LIDOCAINE-EPINEPHRINE (PF) 2 %-1:200000 IJ SOLN
INTRAMUSCULAR | Status: DC | PRN
  Administered 2016-07-13: 25 mL via INTRADERMAL

## 2016-07-13 MED ORDER — PANTOPRAZOLE SODIUM 40 MG IV SOLR
40.0000 mg | Freq: Every day | INTRAVENOUS | Status: DC
  Administered 2016-07-13 – 2016-07-14 (×2): 40 mg via INTRAVENOUS
  Filled 2016-07-13 (×2): qty 40

## 2016-07-13 MED ORDER — HYDROCODONE-ACETAMINOPHEN 5-325 MG PO TABS: 1.0000 | ORAL_TABLET | ORAL | Status: DC | PRN

## 2016-07-13 MED ORDER — ROCURONIUM BROMIDE 100 MG/10ML IV SOLN: INTRAVENOUS | Status: DC | PRN

## 2016-07-13 MED ORDER — DEXAMETHASONE SODIUM PHOSPHATE 4 MG/ML IJ SOLN
2.0000 mg | Freq: Four times a day (QID) | INTRAMUSCULAR | Status: DC
  Filled 2016-07-13: qty 0.5

## 2016-07-13 MED ORDER — THROMBIN 20000 UNITS EX SOLR
CUTANEOUS | Status: AC
  Filled 2016-07-13: qty 20000

## 2016-07-13 MED ORDER — ONDANSETRON HCL 4 MG/2ML IJ SOLN
INTRAMUSCULAR | Status: AC
  Filled 2016-07-13: qty 2

## 2016-07-13 MED ORDER — THROMBIN 5000 UNITS EX SOLR
CUTANEOUS | Status: DC | PRN
  Administered 2016-07-13 (×2): via TOPICAL

## 2016-07-13 MED ORDER — MANNITOL 25 % IV SOLN
INTRAVENOUS | Status: DC | PRN
  Administered 2016-07-13: 50 g via INTRAVENOUS

## 2016-07-13 MED ORDER — CEFAZOLIN SODIUM-DEXTROSE 2-4 GM/100ML-% IV SOLN: 2.0000 g | Freq: Three times a day (TID) | INTRAVENOUS | Status: DC

## 2016-07-13 MED ORDER — SODIUM CHLORIDE 0.9 % IR SOLN
Status: DC | PRN
  Administered 2016-07-13: 08:00:00

## 2016-07-13 MED ORDER — THROMBIN 20000 UNITS EX SOLR
CUTANEOUS | Status: DC | PRN
  Administered 2016-07-13: 08:00:00 via TOPICAL

## 2016-07-13 MED ORDER — 0.9 % SODIUM CHLORIDE (POUR BTL) OPTIME
TOPICAL | Status: DC | PRN
  Administered 2016-07-13 (×2): 1000 mL

## 2016-07-13 MED ORDER — DEXAMETHASONE SODIUM PHOSPHATE 4 MG/ML IJ SOLN
4.0000 mg | Freq: Four times a day (QID) | INTRAMUSCULAR | Status: DC
  Administered 2016-07-14: 4 mg via INTRAVENOUS
  Filled 2016-07-13 (×2): qty 1

## 2016-07-13 MED ORDER — PROPOFOL 10 MG/ML IV BOLUS
INTRAVENOUS | Status: DC | PRN
  Administered 2016-07-13: 60 mg via INTRAVENOUS
  Administered 2016-07-13: 140 mg via INTRAVENOUS

## 2016-07-13 MED ORDER — PROMETHAZINE HCL 25 MG PO TABS: 12.5000 mg | ORAL_TABLET | ORAL | Status: DC | PRN

## 2016-07-13 MED ORDER — DOCUSATE SODIUM 100 MG PO CAPS
100.0000 mg | ORAL_CAPSULE | Freq: Two times a day (BID) | ORAL | Status: DC
  Administered 2016-07-13 – 2016-07-16 (×5): 100 mg via ORAL
  Filled 2016-07-13 (×6): qty 1

## 2016-07-13 MED ORDER — LABETALOL HCL 5 MG/ML IV SOLN
INTRAVENOUS | Status: DC | PRN
  Administered 2016-07-13 (×2): 10 mg via INTRAVENOUS

## 2016-07-13 MED ORDER — HYDROMORPHONE HCL 1 MG/ML IJ SOLN: 0.5000 mg | INTRAMUSCULAR | Status: DC | PRN

## 2016-07-13 MED ORDER — FENTANYL CITRATE (PF) 100 MCG/2ML IJ SOLN
25.0000 ug | INTRAMUSCULAR | Status: DC | PRN
  Administered 2016-07-13 (×2): 50 ug via INTRAVENOUS
  Administered 2016-07-13 (×2): 25 ug via INTRAVENOUS

## 2016-07-13 MED ORDER — SUGAMMADEX SODIUM 200 MG/2ML IV SOLN
INTRAVENOUS | Status: AC
  Filled 2016-07-13: qty 2

## 2016-07-13 MED ORDER — THROMBIN 5000 UNITS EX SOLR
CUTANEOUS | Status: AC
  Filled 2016-07-13: qty 5000

## 2016-07-13 MED ORDER — ONDANSETRON HCL 4 MG/2ML IJ SOLN
INTRAMUSCULAR | Status: DC | PRN
  Administered 2016-07-13: 4 mg via INTRAVENOUS

## 2016-07-13 MED ORDER — ONDANSETRON HCL 4 MG PO TABS: 4.0000 mg | ORAL_TABLET | ORAL | Status: DC | PRN

## 2016-07-13 MED ORDER — THROMBIN 5000 UNITS EX SOLR
CUTANEOUS | Status: AC
  Filled 2016-07-13: qty 10000

## 2016-07-13 MED ORDER — LIDOCAINE HCL (CARDIAC) 20 MG/ML IV SOLN
INTRAVENOUS | Status: DC | PRN
  Administered 2016-07-13: 100 mg via INTRAVENOUS

## 2016-07-13 MED ORDER — DEXAMETHASONE SODIUM PHOSPHATE 10 MG/ML IJ SOLN
6.0000 mg | Freq: Four times a day (QID) | INTRAMUSCULAR | Status: AC
  Administered 2016-07-13 – 2016-07-14 (×4): 6 mg via INTRAVENOUS
  Filled 2016-07-13 (×3): qty 1
  Filled 2016-07-13: qty 0.6

## 2016-07-13 MED ORDER — SENNA 8.6 MG PO TABS
1.0000 | ORAL_TABLET | Freq: Two times a day (BID) | ORAL | Status: DC
  Administered 2016-07-13 – 2016-07-16 (×5): 8.6 mg via ORAL
  Filled 2016-07-13 (×6): qty 1

## 2016-07-13 MED ORDER — HYDRALAZINE HCL 20 MG/ML IJ SOLN
INTRAMUSCULAR | Status: AC
  Filled 2016-07-13: qty 1

## 2016-07-13 MED ORDER — PROPOFOL 10 MG/ML IV BOLUS
INTRAVENOUS | Status: AC
  Filled 2016-07-13: qty 20

## 2016-07-13 MED ORDER — FENTANYL CITRATE (PF) 100 MCG/2ML IJ SOLN
INTRAMUSCULAR | Status: DC | PRN
Start: 2016-07-13 — End: 2016-07-13
  Administered 2016-07-13 (×4): 50 ug via INTRAVENOUS

## 2016-07-13 SURGICAL SUPPLY — 80 items
BANDAGE ADH SHEER 1  50/CT (GAUZE/BANDAGES/DRESSINGS) IMPLANT
BATTERY IQ STERILE (MISCELLANEOUS) ×2 IMPLANT
BLADE CLIPPER SURG (BLADE) IMPLANT
BLADE SURG 10 STRL SS (BLADE) ×3 IMPLANT
BLADE SURG 11 STRL SS (BLADE) ×3 IMPLANT
BTRY SRG DRVR 1.5 IQ (MISCELLANEOUS) ×1
BUR MATCHSTICK NEURO 3.0 LAGG (BURR) ×2 IMPLANT
CANISTER SUCT 3000ML PPV (MISCELLANEOUS) ×3 IMPLANT
CARTRIDGE OIL MAESTRO DRILL (MISCELLANEOUS) ×2 IMPLANT
CHLORAPREP W/TINT 26ML (MISCELLANEOUS) IMPLANT
CORDS BIPOLAR (ELECTRODE) ×3 IMPLANT
COVER MAYO STAND STRL (DRAPES) ×3 IMPLANT
DECANTER SPIKE VIAL GLASS SM (MISCELLANEOUS) ×3 IMPLANT
DIFFUSER DRILL AIR PNEUMATIC (MISCELLANEOUS) ×6 IMPLANT
DRAPE INCISE IOBAN 66X45 STRL (DRAPES) ×3 IMPLANT
DRAPE MICROSCOPE LEICA (MISCELLANEOUS) ×4 IMPLANT
DRAPE POUCH INSTRU U-SHP 10X18 (DRAPES) ×3 IMPLANT
DRAPE SHEET LG 3/4 BI-LAMINATE (DRAPES) ×6 IMPLANT
DRSG TELFA 3X8 NADH (GAUZE/BANDAGES/DRESSINGS) ×3 IMPLANT
DURAMATRIX ONLAY 2X2 (Neuro Prosthesis/Implant) ×4 IMPLANT
ELECT CAUTERY BLADE 6.4 (BLADE) ×3 IMPLANT
ELECT REM PT RETURN 9FT ADLT (ELECTROSURGICAL) ×3
ELECTRODE REM PT RTRN 9FT ADLT (ELECTROSURGICAL) ×1 IMPLANT
FORCEPS BIPOLAR SPETZLER 8 1.0 (NEUROSURGERY SUPPLIES) ×2 IMPLANT
GAUZE SPONGE 4X4 12PLY STRL (GAUZE/BANDAGES/DRESSINGS) ×2 IMPLANT
GAUZE SPONGE 4X4 16PLY XRAY LF (GAUZE/BANDAGES/DRESSINGS) ×3 IMPLANT
GLOVE BIO SURGEON STRL SZ7 (GLOVE) ×4 IMPLANT
GLOVE BIOGEL PI IND STRL 7.5 (GLOVE) ×1 IMPLANT
GLOVE BIOGEL PI INDICATOR 7.5 (GLOVE) ×2
GLOVE EXAM NITRILE LRG STRL (GLOVE) IMPLANT
GLOVE EXAM NITRILE XL STR (GLOVE) IMPLANT
GLOVE EXAM NITRILE XS STR PU (GLOVE) IMPLANT
GLOVE INDICATOR 7.5 STRL GRN (GLOVE) ×2 IMPLANT
GLOVE SS BIOGEL STRL SZ 7.5 (GLOVE) ×2 IMPLANT
GLOVE SS N UNI LF 7.0 STRL (GLOVE) ×2 IMPLANT
GLOVE SUPERSENSE BIOGEL SZ 7.5 (GLOVE) ×4
GLOVE SURG SS PI 7.5 STRL IVOR (GLOVE) ×6 IMPLANT
GOWN STRL REUS W/ TWL LRG LVL3 (GOWN DISPOSABLE) IMPLANT
GOWN STRL REUS W/ TWL XL LVL3 (GOWN DISPOSABLE) IMPLANT
GOWN STRL REUS W/TWL 2XL LVL3 (GOWN DISPOSABLE) IMPLANT
GOWN STRL REUS W/TWL LRG LVL3 (GOWN DISPOSABLE)
GOWN STRL REUS W/TWL XL LVL3 (GOWN DISPOSABLE)
HEMOSTAT POWDER SURGIFOAM 1G (HEMOSTASIS) ×6 IMPLANT
HOOK DURA 1/2IN (MISCELLANEOUS) ×2 IMPLANT
KIT BASIN OR (CUSTOM PROCEDURE TRAY) ×3 IMPLANT
KIT ROOM TURNOVER OR (KITS) ×3 IMPLANT
MARKER SPHERE PSV REFLC 13MM (MARKER) ×6 IMPLANT
NDL HYPO 21X1.5 SAFETY (NEEDLE) ×2 IMPLANT
NDL HYPO 25X1 1.5 SAFETY (NEEDLE) ×1 IMPLANT
NEEDLE HYPO 21X1.5 SAFETY (NEEDLE) ×6 IMPLANT
NEEDLE HYPO 25X1 1.5 SAFETY (NEEDLE) ×3 IMPLANT
NS IRRIG 1000ML POUR BTL (IV SOLUTION) ×7 IMPLANT
OIL CARTRIDGE MAESTRO DRILL (MISCELLANEOUS) ×6
PACK CRANIOTOMY (CUSTOM PROCEDURE TRAY) ×2 IMPLANT
PACK EENT II TURBAN DRAPE (CUSTOM PROCEDURE TRAY) ×3 IMPLANT
PAD ARMBOARD 7.5X6 YLW CONV (MISCELLANEOUS) ×9 IMPLANT
PAD DRESSING TELFA 3X8 NADH (GAUZE/BANDAGES/DRESSINGS) ×1 IMPLANT
PATTIES SURGICAL .5 X.5 (GAUZE/BANDAGES/DRESSINGS) ×3 IMPLANT
PENCIL BUTTON HOLSTER BLD 10FT (ELECTRODE) ×3 IMPLANT
PLATE 1.5  2HOLE LNG NEURO (Plate) ×6 IMPLANT
PLATE 1.5 2HOLE LNG NEURO (Plate) IMPLANT
SCREW SELF DRILL HT 1.5/4MM (Screw) ×12 IMPLANT
SPONGE SURGIFOAM ABS GEL 100 (HEMOSTASIS) ×2 IMPLANT
SPONGE SURGIFOAM ABS GEL SZ50 (HEMOSTASIS) IMPLANT
STAPLER SKIN PROX WIDE 3.9 (STAPLE) ×1 IMPLANT
STAPLER VISISTAT (STAPLE) ×2 IMPLANT
SUT BONE WAX W31G (SUTURE) ×3 IMPLANT
SUT VIC AB 1 CT1 18XBRD ANBCTR (SUTURE) IMPLANT
SUT VIC AB 1 CT1 8-18 (SUTURE) ×6
SUT VIC AB 2-0 CP2 18 (SUTURE) ×3 IMPLANT
SUT VIC AB 2-0 CT1 18 (SUTURE) ×4 IMPLANT
SYR 30ML LL (SYRINGE) ×6 IMPLANT
SYR 5ML LUER SLIP (SYRINGE) ×6 IMPLANT
SYR BULB 3OZ (MISCELLANEOUS) ×3 IMPLANT
SYR CONTROL 10ML LL (SYRINGE) ×3 IMPLANT
TOWEL OR 17X24 6PK STRL BLUE (TOWEL DISPOSABLE) ×3 IMPLANT
TOWEL OR 17X26 10 PK STRL BLUE (TOWEL DISPOSABLE) ×3 IMPLANT
TUBE CONNECTING 12'X1/4 (SUCTIONS) ×1
TUBE CONNECTING 12X1/4 (SUCTIONS) ×2 IMPLANT
WATER STERILE IRR 1000ML POUR (IV SOLUTION) ×3 IMPLANT

## 2016-07-13 NOTE — Progress Notes (Signed)
Awake and alert Moving all extremities Bandage c/d/i To ICU when recovered Labetalol for SBP > 150

## 2016-07-13 NOTE — Anesthesia Postprocedure Evaluation (Signed)
Anesthesia Post Note  Patient: Savannah Howard  Procedure(s) Performed: Procedure(s) (LRB): APPLICATION OF CRANIAL NAVIGATION (N/A) FRAMLESS STERIOTACTIC BIOPSY WITH STEALTH (N/A)  Patient location during evaluation: PACU Anesthesia Type: General Level of consciousness: awake and alert Pain management: pain level controlled Vital Signs Assessment: post-procedure vital signs reviewed and stable Respiratory status: spontaneous breathing, nonlabored ventilation, respiratory function stable and patient connected to nasal cannula oxygen Cardiovascular status: blood pressure returned to baseline and stable Postop Assessment: no signs of nausea or vomiting Anesthetic complications: no    Last Vitals:  Vitals:   07/13/16 1400 07/13/16 1500  BP: (!) 146/60 (!) 165/72  Pulse:  (!) 59  Resp:  13  Temp: 36.7 C     Last Pain:  Vitals:   07/13/16 1400  TempSrc: Axillary  PainSc: 5                  [name]

## 2016-07-13 NOTE — Transfer of Care (Signed)
Immediate Anesthesia Transfer of Care Note  Patient: Savannah Howard  Procedure(s) Performed: Procedure(s): APPLICATION OF CRANIAL NAVIGATION (N/A) FRAMLESS STERIOTACTIC BIOPSY WITH STEALTH (N/A)  Patient Location: PACU  Anesthesia Type:General  Level of Consciousness: awake and alert   Airway & Oxygen Therapy: Patient Spontanous Breathing and Patient connected to nasal cannula oxygen  Post-op Assessment: Report given to RN and Post -op Vital signs reviewed and stable  Post vital signs: Reviewed and stable  Last Vitals:  Vitals:   07/13/16 0153 07/13/16 0553  BP: (!) 138/47 (!) 148/52  Pulse: 60 (!) 50  Resp: 20 18  Temp: 36.8 C 36.8 C    Last Pain:  Vitals:   07/13/16 0553  TempSrc: Oral  PainSc:          Complications: No apparent anesthesia complications

## 2016-07-13 NOTE — Anesthesia Procedure Notes (Signed)
Procedure Name: Intubation Date/Time: 07/13/2016 7:47 AM Performed by: Manuela Schwartz B Pre-anesthesia Checklist: Patient identified, Emergency Drugs available, Patient being monitored, Suction available and Timeout performed Patient Re-evaluated:Patient Re-evaluated prior to inductionOxygen Delivery Method: Circle system utilized Preoxygenation: Pre-oxygenation with 100% oxygen Intubation Type: IV induction Ventilation: Mask ventilation without difficulty Laryngoscope Size: Mac and 3 Grade View: Grade I Tube type: Oral Tube size: 7.5 mm Number of attempts: 1 Airway Equipment and Method: Stylet Placement Confirmation: ETT inserted through vocal cords under direct vision,  positive ETCO2 and breath sounds checked- equal and bilateral Secured at: 19 cm Tube secured with: Tape Dental Injury: Teeth and Oropharynx as per pre-operative assessment

## 2016-07-13 NOTE — Anesthesia Procedure Notes (Signed)
Central Venous Catheter Insertion Performed by: anesthesiologist Patient location: Pre-op. Preanesthetic checklist: patient identified, IV checked, site marked, risks and benefits discussed, surgical consent, monitors and equipment checked, pre-op evaluation, timeout performed and anesthesia consent Position: Trendelenburg Lidocaine 1% used for infiltration Landmarks identified and Seldinger technique used Catheter size: 8 Fr Central line was placed.Double lumen Procedure performed using ultrasound guided technique. Attempts: 1 Following insertion, dressing applied, line sutured and Biopatch. Post procedure assessment: blood return through all ports. Patient tolerated the procedure well with no immediate complications.       

## 2016-07-13 NOTE — Op Note (Signed)
07/09/2016 - 07/13/2016  9:54 AM  PATIENT:  Savannah Howard  70 y.o. 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 Patient understood the risks, benefits, and alternatives and potential outcomes and wished to proceed.  PROCEDURE DETAILS: After smooth induction of general endotracheal anesthesia the patient was fixated with Mayfield pins with the head turned to the left. BrainLab navigation was registered. An incision was planned. Hair was clipped over the area of the planned incision. The skin was wiped down with alcohol. Lidocaine and Marcaine with epinephrine was injected along the planned incision. The patient was prepped and draped in the usual sterile fashion.  A curvilinear incision was made. Raney clips were applied to the skin and galea. Hemostasis was obtained. The temporalis muscle was sharply incised to the bone. It was elevated from the skull using periosteal elevators. The skin and temporalis muscle were then retracted with self-retaining retractors. I used BrainLab navigation to confirm the location of the tumor. Using a 3 mm matchstick bur I fashioned a craniotomy that would provide access to the tumor. The dura was tightly adherent to the bone flap and was unable to be separated. There was a large dural defect within the bone flap was elevated.  I again obtained hemostasis. I used BrainLab navigation to identify the point on the cortical surface where the tumor was closest. Using bipolar cautery I  coagulated the peel surface and then sharply incised it. I bipolared through a thin cortical mantle until I encountered the cyst. This was aspirated. I took samples of the cyst wall for frozen and permanent pathological analysis. I worked around the margins resecting abnormal tissue until I encountered what appeared to be edematous white matter on all surfaces. I was particularly cautious on the anterior and superior margins because of the proximity to the sylvian fissure. At this point the operating microscope was sterilely draped and brought into the field to provide lighting and magnification. Using microsurgical technique I resected additional areas of abnormal tissue. I obtained hemostasis. I packed Surgicel along the tumor cavity.  I placed an inlay and onlay of DuraMatrix.  I then fixated the bone flap to the skull using cranial fixation plates. I then closed the wound in routine anatomic layers using interrupted Vicryl sutures. The skin was closed with a running locked Vicryl stitch. The patient was taken out of Mayfield pins and extubated without difficulty.  PATIENT DISPOSITION:  ICU - extubated and stable.   Delay start of Pharmacological VTE agent (>24hrs) due to surgical blood loss or risk of bleeding:  yes

## 2016-07-13 NOTE — Progress Notes (Signed)
No acute events Neuro stable Ready for OR All questions answered

## 2016-07-13 NOTE — Anesthesia Preprocedure Evaluation (Signed)
Anesthesia Evaluation  Patient identified by MRN, date of birth, ID band Patient awake    Reviewed: Allergy & Precautions  History of Anesthesia Complications Negative for: history of anesthetic complications  Airway Mallampati: II  TM Distance: >3 FB Neck ROM: Full    Dental  (+) Teeth Intact, Dental Advisory Given   Pulmonary neg pulmonary ROS,    Pulmonary exam normal        Cardiovascular hypertension, Pt. on medications Normal cardiovascular exam     Neuro/Psych PSYCHIATRIC DISORDERS Anxiety negative neurological ROS     GI/Hepatic Neg liver ROS, GERD  ,  Endo/Other  Morbid obesity  Renal/GU Renal InsufficiencyRenal disease     Musculoskeletal   Abdominal   Peds  Hematology   Anesthesia Other Findings   Reproductive/Obstetrics                             Anesthesia Physical  Anesthesia Plan  ASA: III  Anesthesia Plan: General   Post-op Pain Management:    Induction: Intravenous  Airway Management Planned: Oral ETT  Additional Equipment: Arterial line  Intra-op Plan:   Post-operative Plan: Extubation in OR  Informed Consent: I have reviewed the patients History and Physical, chart, labs and discussed the procedure including the risks, benefits and alternatives for the proposed anesthesia with the patient or authorized representative who has indicated his/her understanding and acceptance.   Dental advisory given  Plan Discussed with: Anesthesiologist, CRNA and Surgeon  Anesthesia Plan Comments:         Anesthesia Quick Evaluation

## 2016-07-13 NOTE — Progress Notes (Signed)
Pt scheduled for surgery this morning, report called and pt taken down by OR staff at 0627, pt reassured, v/s stable. Obasogie-Asidi, Zareena Willis Efe

## 2016-07-13 NOTE — Progress Notes (Signed)
PROGRESS NOTE        PATIENT DETAILS Name: Savannah Howard Age: 70 y.o. Sex: female Date of Birth: [birth year] Admit Date: 07/09/2016 Admitting Physician Ivor Costa, MD [id number] [name], MD  Brief Narrative: Patient is a 70 y.o. female with hx of left Breast cancer status post neoadjuvant chemotherapy and then underwent mastectomy in October 2016, currently on anastrozole-admitted with possible syncope, further evaluation with CT head/MRI brain showed a cystic mass in the mid superior right temporal lobe. Evaluated by neurosurgery, underwent craniotomy and excision of the brain tumor on 12/4. See below for further details  Subjective: Seen in the neuro intensive care unit, awake and alert, moving all 4 extremities.  Assessment/Plan: Cystic mass in the mid superior right temporal lobe brain: Given history of breast cancer, concern for metastatic disease. Evaluated by neurosurgery, underwent craniotomy and excision of the mass on 12/4. Await biopsy results. Postop care including dosing of steroids and Keppra deferred to neurosurgery. Currently nonfocal exam.  Rhabdomyolysis: Traumatic in a setting of fall, CK has down trended significantly with supportive care and IV fluids. Continue to follow periodically.  Acute on chronic kidney disease stage III: Acute kidney injury likely hemodynamically mediated and secondary to rhabdomyolysis-creatinine close to usual baseline. Follow periodically.  ? UTI: I doubt sepsis, stop antibiotics and stop fluids. Urine culture showing multiple bacterial morphotypes.  Hypertension: Blood pressure currently controlled, continue metoprolol. Resume losartan/HCTZ and Terazole 7 over the next few days if blood pressure able to tolerate.  Fall/syncope: Probably neurocardiogenic-current Doppler unremarkable, echo showed normal systolic function. Doubt further workup is necessary. Continue to monitor in telemetry.  Left knee pain: Probably  secondary to fall, x-rays negative. Continue supportive care. Mobilize with physical therapy  GERD: PPI  Anxiety: Slightly anxious given her current circumstance of postop care/craniotomy-continue as needed lorazepam.  DVT Prophylaxis: SCD's  Code Status: Full code   Family Communication: Friends at [employer] after patient gave me permission  Disposition Plan: Remain inpatient-?SNF on discharge  Antimicrobial agents: See below  Procedures: 12/4>>Supratentorial craniotomy for excision of brain tumor   CONSULTS:  Neurosurgery  Time spent: 25- minutes-Greater than 50% of this time was spent in counseling, explanation of diagnosis, planning of further management, and coordination of care.  MEDICATIONS: Anti-infectives    Start     Dose/Rate Route Frequency Ordered Stop   07/13/16 1430  ceFAZolin (ANCEF) IVPB 2g/100 mL premix  Status:  Discontinued     2 g 200 mL/hr over 30 Minutes Intravenous Every 8 hours 07/13/16 1415 07/13/16 1433   07/13/16 0800  bacitracin 50,000 Units in sodium chloride irrigation 0.9 % 500 mL irrigation  Status:  Discontinued       As needed 07/13/16 1011 07/13/16 1019   07/13/16 0700  vancomycin (VANCOCIN) IVPB 1000 mg/200 mL premix     1,000 mg 200 mL/hr over 60 Minutes Intravenous To [facility] 07/12/16 1246 07/13/16 0857   07/11/16 0800  aztreonam (AZACTAM) 1 g in dextrose 5 % 50 mL IVPB     1 g 100 mL/hr over 30 Minutes Intravenous Every 8 hours 07/11/16 0731     07/10/16 0400  aztreonam (AZACTAM) 1 g in dextrose 5 % 50 mL IVPB  Status:  Discontinued     1 g 100 mL/hr over 30 Minutes Intravenous Every 12 hours 07/10/16 0329 07/11/16 0731   07/10/16 0115  cefTRIAXone (ROCEPHIN) 1 g in dextrose 5 % 50 mL IVPB  Status:  Discontinued     1 g 100 mL/hr over 30 Minutes Intravenous  Once 07/10/16 0108 07/10/16 0214      Scheduled Meds: . anastrozole  1 mg Oral Daily  . aztreonam  1 g Intravenous Q8H  . dexamethasone  6 mg  Intravenous Q6H   Followed by  . [START ON 07/14/2016] dexamethasone  4 mg Intravenous Q6H   Followed by  . [START ON 07/15/2016] dexamethasone  2 mg Intravenous Q6H  . docusate sodium  100 mg Oral BID  . feeding supplement (ENSURE ENLIVE)  237 mL Oral BID BM  . fentaNYL      . fentaNYL      . hydrALAZINE      . labetalol      . levETIRAcetam  750 mg Oral BID  . metoprolol succinate  25 mg Oral Daily  . pantoprazole (PROTONIX) IV  40 mg Intravenous QHS  . senna  1 tablet Oral BID  . sodium chloride flush  3 mL Intravenous Q12H  . terazosin  5 mg Oral QHS  . vitamin B-12  1,000 mcg Oral Daily   Continuous Infusions: . sodium chloride 100 mL/hr at 07/13/16 1500  . sodium chloride 100 mL/hr at 07/13/16 1500   PRN Meds:.acetaminophen, bisacodyl, hydrALAZINE, HYDROcodone-acetaminophen, HYDROmorphone (DILAUDID) injection, LORazepam, naLOXone (NARCAN)  injection, ondansetron **OR** ondansetron (ZOFRAN) IV, oxyCODONE-acetaminophen, promethazine, sodium chloride flush, sodium phosphate   PHYSICAL EXAM: Vital signs: Vitals:   07/13/16 1330 07/13/16 1345 07/13/16 1400 07/13/16 1500  BP:   (!) 146/60 (!) 165/72  Pulse: (!) 58 (!) 59  (!) 59  Resp: 15 13  13   Temp:   98.1 F (36.7 C)   TempSrc:   Axillary   SpO2: 99% 100% 99% 100%  Weight:   91.2 kg (201 lb 1 oz)   Height:   5\' 3"  (1.6 m)    Filed Weights   07/10/16 0109 07/12/16 0314 07/13/16 1400  Weight: 86.2 kg (190 lb) 94.6 kg (208 lb 9 oz) 91.2 kg (201 lb 1 oz)   Body mass index is 35.62 kg/m.   General appearance :Awake, alert, not in any distress. Dressing in scalp in place. Dry. Eyes:, pupils equally reactive to light and accomodationa Neck: supple, no JVD. No cervical lymphadenopathy. Resp:Good air entry bilaterally, no added sounds  CVS: S1 S2 regular, no murmurs.  GI: Bowel sounds present, Non tender and not distended with no gaurding, rigidity or rebound.No organomegaly Extremities: B/L Lower Ext shows no edema,  both legs are warm to touch Neurology:  speech clear, moves all 4 extremities Musculoskeletal:gait appears to be normal.No digital cyanosis Skin:No Rash, warm and dry Wounds:N/A  I have personally reviewed following labs and imaging studies  LABORATORY DATA: CBC:  Recent Labs Lab 07/09/16 2339  WBC 16.3*  NEUTROABS 13.5*  HGB 15.3*  HCT 44.4  MCV 91.5  PLT [id number]    Basic Metabolic Panel:  Recent Labs Lab 07/09/16 2339 07/10/16 1236 07/11/16 0517 07/13/16 0528  NA 136 136 133* 135  K 3.6 4.0 3.8 4.0  CL 99* 103 100* 103  CO2 21* 23 24 24   GLUCOSE 128* 145* 145* 132*  BUN 34* 31* 31* 36*  CREATININE 1.55* 1.36* 1.26* 1.14*  CALCIUM 9.7 8.8* 8.3* 8.2*    GFR: Estimated Creatinine Clearance: 48.5 mL/min (by C-G formula based on SCr of 1.14 mg/dL (H)).  Liver Function Tests:  Recent Labs Lab  07/09/16 2339  AST 71*  ALT 22  ALKPHOS 68  BILITOT 0.9  PROT 7.6  ALBUMIN 4.1   No results for input(s): LIPASE, AMYLASE in the last 168 hours. No results for input(s): AMMONIA in the last 168 hours.  Coagulation Profile: No results for input(s): INR, PROTIME in the last 168 hours.  Cardiac Enzymes:  Recent Labs Lab 07/09/16 2339 07/10/16 0420 07/11/16 0625 07/13/16 0528  CKTOTAL 10,370* 9,810* 3,599* 2,244*  TROPONINI  --  0.78*  --   --     BNP (last 3 results) No results for input(s): PROBNP in the last 8760 hours.  HbA1C: No results for input(s): HGBA1C in the last 72 hours.  CBG:  Recent Labs Lab 07/10/16 0647 07/11/16 0617 07/13/16 0622  GLUCAP 137* 140* 139*    Lipid Profile: No results for input(s): CHOL, HDL, LDLCALC, TRIG, CHOLHDL, LDLDIRECT in the last 72 hours.  Thyroid Function Tests: No results for input(s): TSH, T4TOTAL, FREET4, T3FREE, THYROIDAB in the last 72 hours.  Anemia Panel: No results for input(s): VITAMINB12, FOLATE, FERRITIN, TIBC, IRON, RETICCTPCT in the last 72 hours.  Urine analysis:    Component Value  Date/Time   COLORURINE YELLOW 07/09/2016 2255   APPEARANCEUR CLOUDY (A) 07/09/2016 2255   LABSPEC 1.019 07/09/2016 2255   PHURINE 5.0 07/09/2016 2255   GLUCOSEU NEGATIVE 07/09/2016 2255   GLUCOSEU NEGATIVE 11/05/2014 1456   HGBUR LARGE (A) 07/09/2016 2255   BILIRUBINUR NEGATIVE 07/09/2016 2255   KETONESUR 15 (A) 07/09/2016 2255   PROTEINUR 30 (A) 07/09/2016 2255   UROBILINOGEN 0.2 11/05/2014 1456   NITRITE NEGATIVE 07/09/2016 2255   LEUKOCYTESUR MODERATE (A) 07/09/2016 2255    Sepsis Labs: Lactic Acid, Venous    Component Value Date/Time   LATICACIDVEN 1.6 07/10/2016 0408    MICROBIOLOGY: Recent Results (from the past 240 hour(s))  Urine culture     Status: Abnormal   Collection Time: 07/09/16 10:55 PM  Result Value Ref Range Status   Specimen Description URINE, CLEAN CATCH  Final   Special Requests NONE  Final   Culture MULTIPLE SPECIES PRESENT, SUGGEST RECOLLECTION (A)  Final   Report Status 07/11/2016 FINAL  Final  Surgical PCR screen     Status: None   Collection Time: 07/12/16  8:18 PM  Result Value Ref Range Status   MRSA, PCR NEGATIVE NEGATIVE Final   Staphylococcus aureus NEGATIVE NEGATIVE Final    Comment:        The Xpert SA Assay (FDA approved for NASAL specimens in patients over 5 years of age), is one component of a comprehensive surveillance program.  Test performance has been validated by [facility] for patients greater than or equal to 61 year old. It is not intended to diagnose infection nor to guide or monitor treatment.     RADIOLOGY STUDIES/RESULTS: Dg Chest 1 View  Result Date: 07/09/2016 CLINICAL DATA:  Found on floor after fall EXAM: CHEST 1 VIEW COMPARISON:  12/31/2014 FINDINGS: A right-sided chest port is in place, the superior portion of the catheter is not included on the image. The tip of the catheter projects over expected location of SVC. Surgical clips in the left axilla and within the right upper quadrant. There is mild  cardiomegaly without overt failure. Mediastinal contour is exaggerated by patient rotation. Mild atelectasis left base. No acute infiltrate. No pneumothorax. IMPRESSION: 1. Mild cardiomegaly without overt failure 2. Mild atelectasis at the left lung base. Electronically Signed   By: Madie Reno.D.  On: 07/09/2016 23:38   Dg Chest 2 View  Result Date: 07/13/2016 CLINICAL DATA:  Preoperative chest radiograph.  Initial encounter. EXAM: CHEST  2 VIEW COMPARISON:  Chest radiograph performed 07/09/2016, and CT of the chest performed 07/11/2016 FINDINGS: The lungs are mildly hypoexpanded, with minimal bilateral atelectasis. There is no evidence of pleural effusion or pneumothorax. The heart is enlarged. A right-sided chest port is noted ending about the mid SVC. No acute osseous abnormalities are seen. Scattered clips are noted overlying the left axilla. Clips are noted within the right upper quadrant, reflecting prior cholecystectomy. IMPRESSION: Lungs mildly hypoexpanded, with minimal bilateral atelectasis. Cardiomegaly. Electronically Signed   By: Garald Balding M.D.   On: 07/13/2016 01:37   Dg Lumbar Spine Complete  Result Date: 07/09/2016 CLINICAL DATA:  Fall extremity weakness EXAM: LUMBAR SPINE - COMPLETE 4+ VIEW COMPARISON:  None. FINDINGS: Surgical clips are present in the right upper quadrant. There are 5 non rib-bearing lumbar type vertebra. Calcified pelvic phleboliths. Lumbar alignment within normal limits. Severe narrowing at L5-S1 with endplate changes. Moderate narrowing at L1-L2. The set degenerative changes of the lower lumbar spine. Atherosclerosis of the aorta. IMPRESSION: Degenerative changes.  No acute osseous abnormality. Electronically Signed   By: Donavan Foil M.D.   On: 07/09/2016 23:42   Dg Pelvis 1-2 Views  Result Date: 07/09/2016 CLINICAL DATA:  Found down, fall with leg weakness EXAM: PELVIS - 1-2 VIEW COMPARISON:  None. FINDINGS: SI joints are symmetric. Pubic symphysis is  intact. No fracture or dislocation. IMPRESSION: No acute osseous abnormality Electronically Signed   By: Donavan Foil M.D.   On: 07/09/2016 23:45   Dg Shoulder Right  Result Date: 07/09/2016 CLINICAL DATA:  Fall EXAM: RIGHT SHOULDER - 2+ VIEW COMPARISON:  None. FINDINGS: There is no evidence of fracture or dislocation. There is no evidence of arthropathy or other focal bone abnormality. Soft tissues are unremarkable. IMPRESSION: Negative. Electronically Signed   By: Donavan Foil M.D.   On: 07/09/2016 23:45   Ct Head Wo Contrast  Result Date: 07/10/2016 CLINICAL DATA:  Status post fall with impact to posterior head. EXAM: CT HEAD WITHOUT CONTRAST CT CERVICAL SPINE WITHOUT CONTRAST TECHNIQUE: Multidetector CT imaging of the head and cervical spine was performed following the standard protocol without intravenous contrast. Multiplanar CT image reconstructions of the cervical spine were also generated. COMPARISON:  None. FINDINGS: CT HEAD FINDINGS Brain: There is a cystic mass within the anterior right temporal lobe that measures 3.9 x 3.5 cm with adjacent vasogenic edema extending into the right temporal and lower parietal white matter. There is no midline shift or other mass effect. No hydrocephalus. No intracranial hemorrhage or evidence of acute cortical infarct. The remainder the brain parenchyma is normal. No age advanced or lobar predominant atrophy. Vascular: Atherosclerotic calcification of the vertebral and internal carotid arteries at the skullbase is present. Skull: No skull fracture or focal calvarial lesion. Visualized skull base is normal. Sinuses/Orbits: No sinus fluid levels or advanced mucosal thickening. No mastoid effusion. Normal orbits. CT CERVICAL SPINE FINDINGS Alignment: No static subluxation. Facets are aligned. Occipital condyles are normally positioned. Skull base and vertebrae: No acute fracture. Soft tissues and spinal canal: No prevertebral fluid or swelling. No visible canal  hematoma. Disc levels: No advanced spinal canal or neural foraminal stenosis. Upper chest: Left subarticular predominant C5-C6 disc osteophyte complex narrows the left lateral recess and left neural foramen. Other: Normal visualized paraspinal cervical soft tissues. IMPRESSION: 1. Predominantly cystic mass within the anterior right  temporal lobe with mild surrounding vasogenic edema. Given the patient's history of breast cancer, this is favored to indicate metastatic disease. A primary CNS neoplasm, such as a glioma, is also a consideration. MRI is recommended for further characterization. 2. No midline shift or mass effect. 3. No acute fracture or static subluxation of the cervical spine. Electronically Signed   By: Ulyses Jarred M.D.   On: 07/10/2016 02:23   Ct Chest W Contrast  Result Date: 07/11/2016 CLINICAL DATA:  Status post fall, with generalized chest and abdominal pain. Initial encounter. EXAM: CT CHEST, ABDOMEN, AND PELVIS WITH CONTRAST TECHNIQUE: Multidetector CT imaging of the chest, abdomen and pelvis was performed following the standard protocol during bolus administration of intravenous contrast. CONTRAST:  153mL ISOVUE-300 IOPAMIDOL (ISOVUE-300) INJECTION 61% COMPARISON:  Lumbar spine radiographs performed 07/09/2016 FINDINGS: CT CHEST FINDINGS Cardiovascular: Diffuse coronary artery calcifications are seen. Scattered calcification is seen along the aortic arch and descending thoracic aorta. The heart remains normal in size. The great vessels are grossly unremarkable. Mediastinum/Nodes: The mediastinum is otherwise unremarkable in appearance. No mediastinal lymphadenopathy is seen. No pericardial effusion is identified. The thyroid gland is unremarkable. No axillary lymphadenopathy is seen. A 3.7 cm cyst at the left axilla may reflect a postoperative seroma, given adjacent clips. Lungs/Pleura: Minimal bibasilar atelectasis is noted. The lungs are otherwise clear. No pleural effusion or  pneumothorax is seen. No masses are identified. Musculoskeletal: No acute osseous abnormalities are identified. The visualized musculature is unremarkable in appearance. CT ABDOMEN PELVIS FINDINGS Hepatobiliary: The liver is unremarkable in appearance. The patient is status post cholecystectomy, with clips noted at the gallbladder fossa. The common bile duct remains normal in caliber. Pancreas: The pancreas is within normal limits. Spleen: The spleen is unremarkable in appearance. Adrenals/Urinary Tract: The adrenal glands are unremarkable in appearance. Mild bilateral renal atrophy is noted, with nonspecific bilateral perinephric stranding. There is no evidence of hydronephrosis. No renal or ureteral stones are identified. Stomach/Bowel: The stomach is unremarkable in appearance. The small bowel is within normal limits. The appendix is normal in caliber, without evidence of appendicitis. Scattered diverticulosis is noted along the descending and sigmoid colon, without evidence of diverticulitis. Vascular/Lymphatic: Scattered calcification is seen along the abdominal aorta and its branches. The abdominal aorta is otherwise grossly unremarkable. The inferior vena cava is grossly unremarkable. No retroperitoneal lymphadenopathy is seen. No pelvic sidewall lymphadenopathy is identified. Reproductive: The bladder is mildly distended and within normal limits. The uterus is grossly unremarkable in appearance. The ovaries are relatively symmetric. No suspicious adnexal masses are seen. A 2.4 cm right adnexal cyst is likely physiologic in nature. Other: No additional soft tissue abnormalities are seen. Musculoskeletal: No acute osseous abnormalities are identified. There is swelling at the quadriceps musculature of the proximal left thigh. Would correlate for associated bruising or any evidence of rhabdomyolysis. IMPRESSION: 1. Swelling at the quadriceps musculature of the proximal left thigh. Would correlate for any  associated bruising or evidence of rhabdomyolysis. 2. Diffuse coronary artery calcifications seen. 3. 3.7 cm cyst at the left axilla may reflect a postoperative seroma, given adjacent clips. 4. Mild bilateral renal atrophy noted. 5. Scattered diverticulosis along the descending and sigmoid colon, without evidence of diverticulitis. 6. Scattered aortic atherosclerosis. Electronically Signed   By: Garald Balding M.D.   On: 07/11/2016 04:20   Ct Cervical Spine Wo Contrast  Result Date: 07/10/2016 CLINICAL DATA:  Status post fall with impact to posterior head. EXAM: CT HEAD WITHOUT CONTRAST CT CERVICAL SPINE WITHOUT CONTRAST  TECHNIQUE: Multidetector CT imaging of the head and cervical spine was performed following the standard protocol without intravenous contrast. Multiplanar CT image reconstructions of the cervical spine were also generated. COMPARISON:  None. FINDINGS: CT HEAD FINDINGS Brain: There is a cystic mass within the anterior right temporal lobe that measures 3.9 x 3.5 cm with adjacent vasogenic edema extending into the right temporal and lower parietal white matter. There is no midline shift or other mass effect. No hydrocephalus. No intracranial hemorrhage or evidence of acute cortical infarct. The remainder the brain parenchyma is normal. No age advanced or lobar predominant atrophy. Vascular: Atherosclerotic calcification of the vertebral and internal carotid arteries at the skullbase is present. Skull: No skull fracture or focal calvarial lesion. Visualized skull base is normal. Sinuses/Orbits: No sinus fluid levels or advanced mucosal thickening. No mastoid effusion. Normal orbits. CT CERVICAL SPINE FINDINGS Alignment: No static subluxation. Facets are aligned. Occipital condyles are normally positioned. Skull base and vertebrae: No acute fracture. Soft tissues and spinal canal: No prevertebral fluid or swelling. No visible canal hematoma. Disc levels: No advanced spinal canal or neural foraminal  stenosis. Upper chest: Left subarticular predominant C5-C6 disc osteophyte complex narrows the left lateral recess and left neural foramen. Other: Normal visualized paraspinal cervical soft tissues. IMPRESSION: 1. Predominantly cystic mass within the anterior right temporal lobe with mild surrounding vasogenic edema. Given the patient's history of breast cancer, this is favored to indicate metastatic disease. A primary CNS neoplasm, such as a glioma, is also a consideration. MRI is recommended for further characterization. 2. No midline shift or mass effect. 3. No acute fracture or static subluxation of the cervical spine. Electronically Signed   By: Ulyses Jarred M.D.   On: 07/10/2016 02:23   Mr Jeri Cos [id number] Contrast  Result Date: 07/10/2016 CLINICAL DATA:  70 y/o  F; brain tumor. EXAM: MRI HEAD WITHOUT AND WITH CONTRAST TECHNIQUE: Multiplanar, multiecho pulse sequences of the brain and surrounding structures were obtained without and with intravenous contrast. Brain lab protocol CONTRAST:  53mL MULTIHANCE GADOBENATE DIMEGLUMINE 529 MG/ML IV SOLN COMPARISON:  07/10/2016 CT head. FINDINGS: Mass centered in the right mid superior temporal lobe with expansion of the superior temporal gyrus measuring 49 x 39 x 31 mm (AP x ML x CC, series 11: Image 8 and 12:33). The mass demonstrates a thin peripheral rim of enhancement with diffusion restriction, a central T2 hyperintense and T1 hypointense fluid collection, thin septations, and small surrounding area of nonenhancing T2 FLAIR hyperintense signal abnormality in white matter consistent with vasogenic edema. Local mass effect effaces the temporal horn of right lateral ventricle and results in minimal right-sided uncal herniation and ambient cistern effacement. No significant midline shift. Diffusion tensor imaging demonstrates mild medial displacement of the inferior longitudinal fasciculus which abuts the inferomedial margin of the mass (653:23). Additionally, the optic  radiations are medially displaced and abut the posterior superiomedial aspect of the mass (653:26). Decreased anisotropy within the surrounding white matter tracts, optic radiations, and inferior longitudinal fasciculus is likely due to vasogenic edema. If the mass is a primary CNS tumor, then nonenhancing tumor infiltration is also possible. IMPRESSION: Cystic mass centered in the mid superior right temporal lobe with thin peripheral enhancement. 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 There is medial displacement of the right inferior longitudinal fasciculus and right optic radiations which abut the medial aspect of tumor as described. Electronically Signed   By: Kristine Garbe M.D.   On: 07/10/2016 20:16  Ct Abdomen Pelvis W Contrast  Result Date: 07/11/2016 CLINICAL DATA:  Status post fall, with generalized chest and abdominal pain. Initial encounter. EXAM: CT CHEST, ABDOMEN, AND PELVIS WITH CONTRAST TECHNIQUE: Multidetector CT imaging of the chest, abdomen and pelvis was performed following the standard protocol during bolus administration of intravenous contrast. CONTRAST:  171mL ISOVUE-300 IOPAMIDOL (ISOVUE-300) INJECTION 61% COMPARISON:  Lumbar spine radiographs performed 07/09/2016 FINDINGS: CT CHEST FINDINGS Cardiovascular: Diffuse coronary artery calcifications are seen. Scattered calcification is seen along the aortic arch and descending thoracic aorta. The heart remains normal in size. The great vessels are grossly unremarkable. Mediastinum/Nodes: The mediastinum is otherwise unremarkable in appearance. No mediastinal lymphadenopathy is seen. No pericardial effusion is identified. The thyroid gland is unremarkable. No axillary lymphadenopathy is seen. A 3.7 cm cyst at the left axilla may reflect a postoperative seroma, given adjacent clips. Lungs/Pleura: Minimal bibasilar atelectasis is noted. The lungs are otherwise clear. No pleural effusion or  pneumothorax is seen. No masses are identified. Musculoskeletal: No acute osseous abnormalities are identified. The visualized musculature is unremarkable in appearance. CT ABDOMEN PELVIS FINDINGS Hepatobiliary: The liver is unremarkable in appearance. The patient is status post cholecystectomy, with clips noted at the gallbladder fossa. The common bile duct remains normal in caliber. Pancreas: The pancreas is within normal limits. Spleen: The spleen is unremarkable in appearance. Adrenals/Urinary Tract: The adrenal glands are unremarkable in appearance. Mild bilateral renal atrophy is noted, with nonspecific bilateral perinephric stranding. There is no evidence of hydronephrosis. No renal or ureteral stones are identified. Stomach/Bowel: The stomach is unremarkable in appearance. The small bowel is within normal limits. The appendix is normal in caliber, without evidence of appendicitis. Scattered diverticulosis is noted along the descending and sigmoid colon, without evidence of diverticulitis. Vascular/Lymphatic: Scattered calcification is seen along the abdominal aorta and its branches. The abdominal aorta is otherwise grossly unremarkable. The inferior vena cava is grossly unremarkable. No retroperitoneal lymphadenopathy is seen. No pelvic sidewall lymphadenopathy is identified. Reproductive: The bladder is mildly distended and within normal limits. The uterus is grossly unremarkable in appearance. The ovaries are relatively symmetric. No suspicious adnexal masses are seen. A 2.4 cm right adnexal cyst is likely physiologic in nature. Other: No additional soft tissue abnormalities are seen. Musculoskeletal: No acute osseous abnormalities are identified. There is swelling at the quadriceps musculature of the proximal left thigh. Would correlate for associated bruising or any evidence of rhabdomyolysis. IMPRESSION: 1. Swelling at the quadriceps musculature of the proximal left thigh. Would correlate for any  associated bruising or evidence of rhabdomyolysis. 2. Diffuse coronary artery calcifications seen. 3. 3.7 cm cyst at the left axilla may reflect a postoperative seroma, given adjacent clips. 4. Mild bilateral renal atrophy noted. 5. Scattered diverticulosis along the descending and sigmoid colon, without evidence of diverticulitis. 6. Scattered aortic atherosclerosis. Electronically Signed   By: Garald Balding M.D.   On: 07/11/2016 04:20   Dg Knee Complete 4 Views Left  Result Date: 07/09/2016 CLINICAL DATA:  Found on floor, fall weakness EXAM: LEFT KNEE - COMPLETE 4+ VIEW COMPARISON:  None. FINDINGS: No acute displaced fracture or dislocation. Mild narrowing of the medial and lateral joint space compartments with bony spurring. Superior and inferior patellar osteophytes. Vascular calcifications. IMPRESSION: No acute osseous abnormality Electronically Signed   By: Donavan Foil M.D.   On: 07/09/2016 23:40     LOS: 3 days   Oren Binet, MD  [facility] Pager:[phone]  If 7PM-7AM, please contact night-coverage [email or web address] Password [email or web address] 07/13/2016, 3:14 PM

## 2016-07-14 ENCOUNTER — Inpatient Hospital Stay (HOSPITAL_COMMUNITY): Payer: Medicare Other

## 2016-07-14 LAB — BASIC METABOLIC PANEL
Anion gap: 10 (ref 5–15)
BUN: 27 mg/dL — AB (ref 4–21)
BUN: 27 mg/dL — AB (ref 6–20)
CALCIUM: 8.1 mg/dL — AB (ref 8.9–10.3)
CO2: 24 mmol/L (ref 22–32)
CREATININE: 0.99 mg/dL (ref 0.44–1.00)
Chloride: 102 mmol/L (ref 101–111)
Creatinine: 1 mg/dL (ref 0.5–1.1)
GFR calc Af Amer: >60 mL/min (ref >60)
GFR, EST NON AFRICAN AMERICAN: 56 mL/min — AB (ref >60)
Glucose, Bld: 133 mg/dL — ABNORMAL HIGH (ref 65–99)
Potassium: 3.7 mmol/L (ref 3.5–5.1)
SODIUM: 136 mmol/L (ref 135–145)
Sodium: 136 mmol/L — AB (ref 137–147)

## 2016-07-14 LAB — CBC
HCT: 28.8 % — ABNORMAL LOW (ref 36.0–46.0)
Hemoglobin: 9.5 g/dL — ABNORMAL LOW (ref 12.0–15.0)
MCH: 31.3 pg (ref 26.0–34.0)
MCHC: 33 g/dL (ref 30.0–36.0)
MCV: 94.7 fL (ref 78.0–100.0)
Platelets: 208 10*3/uL (ref 150–400)
RBC: 3.04 MIL/uL — ABNORMAL LOW (ref 3.87–5.11)
RDW: 13.9 % (ref 11.5–15.5)
WBC: 11.4 10*3/uL — AB (ref 4.0–10.5)

## 2016-07-14 LAB — CBC AND DIFFERENTIAL: WBC: 11.4 10*3/mL

## 2016-07-14 LAB — GLUCOSE, CAPILLARY: Glucose-Capillary: 134 mg/dL — ABNORMAL HIGH (ref 65–99)

## 2016-07-14 MED ORDER — DEXAMETHASONE 4 MG PO TABS
4.0000 mg | ORAL_TABLET | Freq: Four times a day (QID) | ORAL | Status: AC
  Administered 2016-07-14 – 2016-07-15 (×3): 4 mg via ORAL
  Filled 2016-07-14 (×3): qty 1

## 2016-07-14 MED ORDER — DEXAMETHASONE 2 MG PO TABS
2.0000 mg | ORAL_TABLET | Freq: Four times a day (QID) | ORAL | Status: DC
  Administered 2016-07-15 – 2016-07-16 (×5): 2 mg via ORAL
  Filled 2016-07-14 (×5): qty 1

## 2016-07-14 MED ORDER — GADOBENATE DIMEGLUMINE 529 MG/ML IV SOLN
20.0000 mL | Freq: Once | INTRAVENOUS | Status: AC | PRN
  Administered 2016-07-14: 20 mL via INTRAVENOUS

## 2016-07-14 NOTE — Progress Notes (Signed)
Transfered to 5M06 via bed from 3MW.  Denies pain, A&O x 4, telfa dressing intact right temporal parietal area. Oriented to room, safety precautions & plan of care.

## 2016-07-14 NOTE — Progress Notes (Signed)
Physical Therapy Treatment Patient Details Name: Savannah Howard MRN: [id number] DOB: [birth year] Today's Date: 07/14/2016    History of Present Illness Pt is a 70 y/o female admitted secondary to sustaining a fall at home. PMH including but not limited to breast cancer (s/p mastectomy and chemo - 2016) and CKD.  CT head showed cystic mass within the anterior right temporal lobe with mild surrounding vasogenic edema, no midline shift or mass effect. s/p supratentorial craniotomy excision brain tumor.    PT Comments    Patient s/p craniotomy. Pt's LLE seems improved but continues to exhibit weakness. Tolerated side stepping along side bed with assist for balance and for LLE stability. Pt in great spirits and highly motivated to improve function. Continue to recommend SNF to maximize independence prior to return home.   Follow Up Recommendations  SNF     Equipment Recommendations  None recommended by PT    Recommendations for Other Services       Precautions / Restrictions Precautions Precautions: Fall Restrictions Weight Bearing Restrictions: No    Mobility  Bed Mobility Overal bed mobility: Needs Assistance Bed Mobility: Supine to Sit     Supine to sit: Min guard;HOB elevated Sit to supine: Min assist   General bed mobility comments: Able to move BLEs to EOB without assist. Increased time and use of rail. Min A to scoot bottom to EOB.  Transfers Overall transfer level: Needs assistance Equipment used: 2 person hand held assist Transfers: Sit to/from Stand Sit to Stand: Min assist         General transfer comment: Assist to boost to standing with therapist close to stabilize LLE during transition due to weakness. Stood from [employer].  Cues for hand placement/technique.  Ambulation/Gait Ambulation/Gait assistance: Min assist Ambulation Distance (Feet): 6 Feet (x3 bouts) Assistive device: 2 person hand held assist Gait Pattern/deviations: Step-to pattern Gait  velocity: decreased Gait velocity interpretation: Below normal speed for age/gender General Gait Details: Able to side step along side bed x3 with therapist stabilizing LLE due to partial buckling x3.   Stairs            Wheelchair Mobility    Modified Rankin (Stroke Patients Only)       Balance Overall balance assessment: Needs assistance Sitting-balance support: Feet supported;No upper extremity supported Sitting balance-Leahy Scale: Fair     Standing balance support: During functional activity Standing balance-Leahy Scale: Poor Standing balance comment: BUE support in standing.                    Cognition Arousal/Alertness: Awake/alert Behavior During Therapy: WFL for tasks assessed/performed Overall Cognitive Status: History of cognitive impairments - at baseline       Memory: Decreased short-term memory              Exercises General Exercises - Lower Extremity Long Arc Quad: Left;10 reps;Seated    General Comments        Pertinent Vitals/Pain Pain Assessment: Faces Faces Pain Scale: Hurts a little bit Pain Location: LLE Pain Descriptors / Indicators: Sore Pain Intervention(s): Monitored during session    Home Living                      Prior Function            PT Goals (current goals can now be found in the care plan section) Progress towards PT goals: Progressing toward goals    Frequency    Min 3X/week  PT Plan Current plan remains appropriate    Co-evaluation             End of Session Equipment Utilized During Treatment: Gait belt Activity Tolerance: Patient limited by fatigue Patient left: in bed;with call bell/phone within reach;with bed alarm set;with nursing/sitter in room     Time: 1133-1200 PT Time Calculation (min) (ACUTE ONLY): 27 min  Charges:  $Gait Training: 8-22 mins   PT- Reevaluation                    G Codes:      Canio Winokur A Damarius Karnes 07/14/2016, 12:09 PM  Wray Kearns, [place], DPT [phone]

## 2016-07-14 NOTE — Care Management Important Message (Signed)
Important Message  Patient Details  Name: Savannah Howard MRN: [id number] Date of Birth: 1945-09-01   Medicare Important Message Given:  Other (see comment)    [place], Arial Galligan Abena 07/14/2016, 9:38 AM

## 2016-07-14 NOTE — Care Management Note (Signed)
Case Management Note  Patient Details  Name: Savannah Howard MRN: [id number] Date of Birth: Dec 13, 1945  Subjective/Objective: Pt admitted s/p fall at home; CT showed cystic mass RT temporal lobe.  Pt s/p supratentorial craniotomy for excision of brain tumor on 07/13/16.  PTA, pt independent and currently living in a hotel while her home is being remodeled.  Has close friend and neighbor who is also her POA.                    Action/Plan: PT/OT recommending SNF for rehab at discharge.  Will consult CSW to facilitate dc to SNF upon medical stability.  Will follow as pt progresses.    Expected Discharge Date:                  Expected Discharge Plan:  Skilled Nursing Facility  In-House Referral:  Clinical Social Work  Discharge planning Services  CM Consult  Post Acute Care Choice:    Choice offered to:     DME Arranged:    DME Agency:     HH Arranged:    [place] Agency:     Status of Service:  In process, will continue to follow  If discussed at Long Length of Stay Meetings, dates discussed:    Additional Comments:  Reinaldo Raddle, RN, BSN  Trauma/Neuro ICU Case Manager [phone]

## 2016-07-14 NOTE — Progress Notes (Signed)
No acute events Awake and alert Moving all extremities well Bandage c/d/i Will move out of ICU if MRI looks good

## 2016-07-14 NOTE — Progress Notes (Addendum)
PROGRESS NOTE        PATIENT DETAILS Name: Savannah Howard Age: 70 y.o. Sex: female Date of Birth: 1945-09-01 Admit Date: 07/09/2016 Admitting Physician Ivor Costa, MD [id number] [name], MD  Brief Narrative: Patient is a 70 y.o. female with hx of left Breast cancer status post neoadjuvant chemotherapy and then underwent mastectomy in October 2016, currently on anastrozole-admitted with possible syncope, further evaluation with CT head/MRI brain showed a cystic mass in the mid superior right temporal lobe. Evaluated by neurosurgery, underwent craniotomy and excision of the brain tumor on 12/4. See below for further details  Subjective: Awake and alert, moving all 4 extremities.  Some pain in her left thigh area.  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 Evaluated by neurosurgery, underwent craniotomy and excision of the mass on 12/4. Await biopsy results. Postop care including dosing of steroids and Keppra deferred to neurosurgery. Currently nonfocal exam.  Rhabdomyolysis: Traumatic in a setting of fall, CK has down trended significantly with supportive care and IV fluids. Continue to follow periodically.  Acute on chronic kidney disease stage III: Acute kidney injury likely hemodynamically mediated and secondary to rhabdomyolysis-creatinine close to usual baseline. Follow periodically.  ? UTI: I doubt sepsis, being monitored off antibiotics. I do not think there is any utility in repeating urine cultures-initial urine culture showed multiple bacterial morphotypes-patient was on IV antimicrobial therapy for a few days.  Hypertension: Blood pressure currently controlled, continue metoprolol. Resume losartan/HCTZ and Terazosin over the next few days if blood pressure able to tolerate.  Fall/syncope: Probably  neurocardiogenic-current Doppler unremarkable, echo showed normal systolic function. Doubt further workup is necessary. Continue to monitor in telemetry.  Left knee pain: Probably secondary to fall, x-rays negative. Continue supportive care. Mobilize with physical therapy-if unable to bear weight, then will need further imaging.  GERD: PPI  Anxiety: Slightly anxious given her current circumstance of postop care/craniotomy-continue as needed lorazepam.  DVT Prophylaxis: SCD's  Code Status: Full code   Family Communication: None at bedside  Disposition Plan: Remain inpatient-?SNF on discharge-move out of SDU at discretion of neurosurgery  Antimicrobial agents: See below  Procedures: 12/4>>Supratentorial craniotomy for excision of brain tumor   CONSULTS:  Neurosurgery  Time spent: 25- minutes-Greater than 50% of this time was spent in counseling, explanation of diagnosis, planning of further management, and coordination of care.  MEDICATIONS: Anti-infectives    Start     Dose/Rate Route Frequency Ordered Stop   07/13/16 1430  ceFAZolin (ANCEF) IVPB 2g/100 mL premix  Status:  Discontinued     2 g 200 mL/hr over 30 Minutes Intravenous Every 8 hours 07/13/16 1415 07/13/16 1433   07/13/16 0800  bacitracin 50,000 Units in sodium chloride irrigation 0.9 % 500 mL irrigation  Status:  Discontinued       As needed 07/13/16 1011 07/13/16 1019   07/13/16 0700  vancomycin (VANCOCIN) IVPB 1000 mg/200 mL premix     1,000 mg 200 mL/hr over 60 Minutes Intravenous To [facility] 07/12/16 1246 07/13/16 0857   07/11/16 0800  aztreonam (AZACTAM) 1 g in dextrose 5 % 50 mL IVPB  Status:  Discontinued     1 g 100 mL/hr over 30 Minutes Intravenous Every 8 hours 07/11/16 0731 07/13/16 1520  07/10/16 0400  aztreonam (AZACTAM) 1 g in dextrose 5 % 50 mL IVPB  Status:  Discontinued     1 g 100 mL/hr over 30 Minutes Intravenous Every 12 hours 07/10/16 0329 07/11/16 0731   07/10/16 0115   cefTRIAXone (ROCEPHIN) 1 g in dextrose 5 % 50 mL IVPB  Status:  Discontinued     1 g 100 mL/hr over 30 Minutes Intravenous  Once 07/10/16 0108 07/10/16 0214      Scheduled Meds: . anastrozole  1 mg Oral Daily  . dexamethasone  4 mg Intravenous Q6H   Followed by  . [START ON 07/15/2016] dexamethasone  2 mg Intravenous Q6H  . docusate sodium  100 mg Oral BID  . feeding supplement (ENSURE ENLIVE)  237 mL Oral BID BM  . levETIRAcetam  750 mg Oral BID  . metoprolol succinate  25 mg Oral Daily  . pantoprazole (PROTONIX) IV  40 mg Intravenous QHS  . senna  1 tablet Oral BID  . sodium chloride flush  3 mL Intravenous Q12H  . terazosin  5 mg Oral QHS  . vitamin B-12  1,000 mcg Oral Daily   Continuous Infusions: . sodium chloride 100 mL/hr at 07/14/16 1000  . sodium chloride 100 mL/hr at 07/13/16 2000   PRN Meds:.acetaminophen, bisacodyl, hydrALAZINE, HYDROcodone-acetaminophen, HYDROmorphone (DILAUDID) injection, LORazepam, naLOXone (NARCAN)  injection, ondansetron **OR** ondansetron (ZOFRAN) IV, oxyCODONE-acetaminophen, promethazine, sodium chloride flush, sodium phosphate   PHYSICAL EXAM: Vital signs: Vitals:   07/14/16 0900 07/14/16 0930 07/14/16 1000 07/14/16 1030  BP: (!) 149/66 (!) 138/52 (!) 139/54 (!) 157/64  Pulse: 81 68 66   Resp: 15 11 13 13   Temp:      TempSrc:      SpO2: 95% 92% 95%   Weight:      Height:       Filed Weights   07/12/16 0314 07/13/16 1400 07/14/16 0500  Weight: 94.6 kg (208 lb 9 oz) 91.2 kg (201 lb 1 oz) 91 kg (200 lb 9.9 oz)   Body mass index is 35.54 kg/m.   General appearance :Awake, alert, not in any distress. Dressing in scalp in place. Dry. Eyes:, pupils equally reactive to light and accomodationa Neck: supple, no JVD. No cervical lymphadenopathy. Resp:Good air entry bilaterally, no added sounds  CVS: S1 S2 regular, no murmurs.  GI: Bowel sounds present, Non tender and not distended with no gaurding, rigidity or rebound.No  organomegaly Extremities: B/L Lower Ext shows no edema, both legs are warm to touch Neurology:  speech clear, moves all 4 extremities Musculoskeletal:gait appears to be normal.No digital cyanosis Skin:No Rash, warm and dry Wounds:N/A  I have personally reviewed following labs and imaging studies  LABORATORY DATA: CBC:  Recent Labs Lab 07/09/16 2339 07/14/16 0530  WBC 16.3* 11.4*  NEUTROABS 13.5*  --   HGB 15.3* 9.5*  HCT 44.4 28.8*  MCV 91.5 94.7  PLT 222 [id number]    Basic Metabolic Panel:  Recent Labs Lab 07/09/16 2339 07/10/16 1236 07/11/16 0517 07/13/16 0528 07/14/16 0530  NA 136 136 133* 135 136  K 3.6 4.0 3.8 4.0 3.7  CL 99* 103 100* 103 102  CO2 21* 23 24 24 24   GLUCOSE 128* 145* 145* 132* 133*  BUN 34* 31* 31* 36* 27*  CREATININE 1.55* 1.36* 1.26* 1.14* 0.99  CALCIUM 9.7 8.8* 8.3* 8.2* 8.1*    GFR: Estimated Creatinine Clearance: 55.8 mL/min (by C-G formula based on SCr of 0.99 mg/dL).  Liver Function Tests:  Recent Labs Lab  07/09/16 2339  AST 71*  ALT 22  ALKPHOS 68  BILITOT 0.9  PROT 7.6  ALBUMIN 4.1   No results for input(s): LIPASE, AMYLASE in the last 168 hours. No results for input(s): AMMONIA in the last 168 hours.  Coagulation Profile: No results for input(s): INR, PROTIME in the last 168 hours.  Cardiac Enzymes:  Recent Labs Lab 07/09/16 2339 07/10/16 0420 07/11/16 0625 07/13/16 0528  CKTOTAL 10,370* 9,810* 3,599* 2,244*  TROPONINI  --  0.78*  --   --     BNP (last 3 results) No results for input(s): PROBNP in the last 8760 hours.  HbA1C: No results for input(s): HGBA1C in the last 72 hours.  CBG:  Recent Labs Lab 07/10/16 0647 07/11/16 0617 07/13/16 0622 07/14/16 0559  GLUCAP 137* 140* 139* 134*    Lipid Profile: No results for input(s): CHOL, HDL, LDLCALC, TRIG, CHOLHDL, LDLDIRECT in the last 72 hours.  Thyroid Function Tests: No results for input(s): TSH, T4TOTAL, FREET4, T3FREE, THYROIDAB in the last 72  hours.  Anemia Panel: No results for input(s): VITAMINB12, FOLATE, FERRITIN, TIBC, IRON, RETICCTPCT in the last 72 hours.  Urine analysis:    Component Value Date/Time   COLORURINE YELLOW 07/09/2016 2255   APPEARANCEUR CLOUDY (A) 07/09/2016 2255   LABSPEC 1.019 07/09/2016 2255   PHURINE 5.0 07/09/2016 2255   GLUCOSEU NEGATIVE 07/09/2016 2255   GLUCOSEU NEGATIVE 11/05/2014 1456   HGBUR LARGE (A) 07/09/2016 2255   BILIRUBINUR NEGATIVE 07/09/2016 2255   KETONESUR 15 (A) 07/09/2016 2255   PROTEINUR 30 (A) 07/09/2016 2255   UROBILINOGEN 0.2 11/05/2014 1456   NITRITE NEGATIVE 07/09/2016 2255   LEUKOCYTESUR MODERATE (A) 07/09/2016 2255    Sepsis Labs: Lactic Acid, Venous    Component Value Date/Time   LATICACIDVEN 1.6 07/10/2016 0408    MICROBIOLOGY: Recent Results (from the past 240 hour(s))  Urine culture     Status: Abnormal   Collection Time: 07/09/16 10:55 PM  Result Value Ref Range Status   Specimen Description URINE, CLEAN CATCH  Final   Special Requests NONE  Final   Culture MULTIPLE SPECIES PRESENT, SUGGEST RECOLLECTION (A)  Final   Report Status 07/11/2016 FINAL  Final  Surgical PCR screen     Status: None   Collection Time: 07/12/16  8:18 PM  Result Value Ref Range Status   MRSA, PCR NEGATIVE NEGATIVE Final   Staphylococcus aureus NEGATIVE NEGATIVE Final    Comment:        The Xpert SA Assay (FDA approved for NASAL specimens in patients over 4 years of age), is one component of a comprehensive surveillance program.  Test performance has been validated by [facility] for patients greater than or equal to 63 year old. It is not intended to diagnose infection nor to guide or monitor treatment.     RADIOLOGY STUDIES/RESULTS: Dg Chest 1 View  Result Date: 07/09/2016 CLINICAL DATA:  Found on floor after fall EXAM: CHEST 1 VIEW COMPARISON:  12/31/2014 FINDINGS: A right-sided chest port is in place, the superior portion of the catheter is not included on  the image. The tip of the catheter projects over expected location of SVC. Surgical clips in the left axilla and within the right upper quadrant. There is mild cardiomegaly without overt failure. Mediastinal contour is exaggerated by patient rotation. Mild atelectasis left base. No acute infiltrate. No pneumothorax. IMPRESSION: 1. Mild cardiomegaly without overt failure 2. Mild atelectasis at the left lung base. Electronically Signed   By: Donavan Foil  M.D.   On: 07/09/2016 23:38   Dg Chest 2 View  Result Date: 07/13/2016 CLINICAL DATA:  Preoperative chest radiograph.  Initial encounter. EXAM: CHEST  2 VIEW COMPARISON:  Chest radiograph performed 07/09/2016, and CT of the chest performed 07/11/2016 FINDINGS: The lungs are mildly hypoexpanded, with minimal bilateral atelectasis. There is no evidence of pleural effusion or pneumothorax. The heart is enlarged. A right-sided chest port is noted ending about the mid SVC. No acute osseous abnormalities are seen. Scattered clips are noted overlying the left axilla. Clips are noted within the right upper quadrant, reflecting prior cholecystectomy. IMPRESSION: Lungs mildly hypoexpanded, with minimal bilateral atelectasis. Cardiomegaly. Electronically Signed   By: Garald Balding M.D.   On: 07/13/2016 01:37   Dg Lumbar Spine Complete  Result Date: 07/09/2016 CLINICAL DATA:  Fall extremity weakness EXAM: LUMBAR SPINE - COMPLETE 4+ VIEW COMPARISON:  None. FINDINGS: Surgical clips are present in the right upper quadrant. There are 5 non rib-bearing lumbar type vertebra. Calcified pelvic phleboliths. Lumbar alignment within normal limits. Severe narrowing at L5-S1 with endplate changes. Moderate narrowing at L1-L2. The set degenerative changes of the lower lumbar spine. Atherosclerosis of the aorta. IMPRESSION: Degenerative changes.  No acute osseous abnormality. Electronically Signed   By: Donavan Foil M.D.   On: 07/09/2016 23:42   Dg Pelvis 1-2 Views  Result Date:  07/09/2016 CLINICAL DATA:  Found down, fall with leg weakness EXAM: PELVIS - 1-2 VIEW COMPARISON:  None. FINDINGS: SI joints are symmetric. Pubic symphysis is intact. No fracture or dislocation. IMPRESSION: No acute osseous abnormality Electronically Signed   By: Donavan Foil M.D.   On: 07/09/2016 23:45   Dg Shoulder Right  Result Date: 07/09/2016 CLINICAL DATA:  Fall EXAM: RIGHT SHOULDER - 2+ VIEW COMPARISON:  None. FINDINGS: There is no evidence of fracture or dislocation. There is no evidence of arthropathy or other focal bone abnormality. Soft tissues are unremarkable. IMPRESSION: Negative. Electronically Signed   By: Donavan Foil M.D.   On: 07/09/2016 23:45   Ct Head Wo Contrast  Result Date: 07/10/2016 CLINICAL DATA:  Status post fall with impact to posterior head. EXAM: CT HEAD WITHOUT CONTRAST CT CERVICAL SPINE WITHOUT CONTRAST TECHNIQUE: Multidetector CT imaging of the head and cervical spine was performed following the standard protocol without intravenous contrast. Multiplanar CT image reconstructions of the cervical spine were also generated. COMPARISON:  None. FINDINGS: CT HEAD FINDINGS Brain: There is a cystic mass within the anterior right temporal lobe that measures 3.9 x 3.5 cm with adjacent vasogenic edema extending into the right temporal and lower parietal white matter. There is no midline shift or other mass effect. No hydrocephalus. No intracranial hemorrhage or evidence of acute cortical infarct. The remainder the brain parenchyma is normal. No age advanced or lobar predominant atrophy. Vascular: Atherosclerotic calcification of the vertebral and internal carotid arteries at the skullbase is present. Skull: No skull fracture or focal calvarial lesion. Visualized skull base is normal. Sinuses/Orbits: No sinus fluid levels or advanced mucosal thickening. No mastoid effusion. Normal orbits. CT CERVICAL SPINE FINDINGS Alignment: No static subluxation. Facets are aligned. Occipital  condyles are normally positioned. Skull base and vertebrae: No acute fracture. Soft tissues and spinal canal: No prevertebral fluid or swelling. No visible canal hematoma. Disc levels: No advanced spinal canal or neural foraminal stenosis. Upper chest: Left subarticular predominant C5-C6 disc osteophyte complex narrows the left lateral recess and left neural foramen. Other: Normal visualized paraspinal cervical soft tissues. IMPRESSION: 1. Predominantly cystic mass within  the anterior right temporal lobe with mild surrounding vasogenic edema. Given the patient's history of breast cancer, this is favored to indicate metastatic disease. A primary CNS neoplasm, such as a glioma, is also a consideration. MRI is recommended for further characterization. 2. No midline shift or mass effect. 3. No acute fracture or static subluxation of the cervical spine. Electronically Signed   By: Ulyses Jarred M.D.   On: 07/10/2016 02:23   Ct Chest W Contrast  Result Date: 07/11/2016 CLINICAL DATA:  Status post fall, with generalized chest and abdominal pain. Initial encounter. EXAM: CT CHEST, ABDOMEN, AND PELVIS WITH CONTRAST TECHNIQUE: Multidetector CT imaging of the chest, abdomen and pelvis was performed following the standard protocol during bolus administration of intravenous contrast. CONTRAST:  174mL ISOVUE-300 IOPAMIDOL (ISOVUE-300) INJECTION 61% COMPARISON:  Lumbar spine radiographs performed 07/09/2016 FINDINGS: CT CHEST FINDINGS Cardiovascular: Diffuse coronary artery calcifications are seen. Scattered calcification is seen along the aortic arch and descending thoracic aorta. The heart remains normal in size. The great vessels are grossly unremarkable. Mediastinum/Nodes: The mediastinum is otherwise unremarkable in appearance. No mediastinal lymphadenopathy is seen. No pericardial effusion is identified. The thyroid gland is unremarkable. No axillary lymphadenopathy is seen. A 3.7 cm cyst at the left axilla may reflect a  postoperative seroma, given adjacent clips. Lungs/Pleura: Minimal bibasilar atelectasis is noted. The lungs are otherwise clear. No pleural effusion or pneumothorax is seen. No masses are identified. Musculoskeletal: No acute osseous abnormalities are identified. The visualized musculature is unremarkable in appearance. CT ABDOMEN PELVIS FINDINGS Hepatobiliary: The liver is unremarkable in appearance. The patient is status post cholecystectomy, with clips noted at the gallbladder fossa. The common bile duct remains normal in caliber. Pancreas: The pancreas is within normal limits. Spleen: The spleen is unremarkable in appearance. Adrenals/Urinary Tract: The adrenal glands are unremarkable in appearance. Mild bilateral renal atrophy is noted, with nonspecific bilateral perinephric stranding. There is no evidence of hydronephrosis. No renal or ureteral stones are identified. Stomach/Bowel: The stomach is unremarkable in appearance. The small bowel is within normal limits. The appendix is normal in caliber, without evidence of appendicitis. Scattered diverticulosis is noted along the descending and sigmoid colon, without evidence of diverticulitis. Vascular/Lymphatic: Scattered calcification is seen along the abdominal aorta and its branches. The abdominal aorta is otherwise grossly unremarkable. The inferior vena cava is grossly unremarkable. No retroperitoneal lymphadenopathy is seen. No pelvic sidewall lymphadenopathy is identified. Reproductive: The bladder is mildly distended and within normal limits. The uterus is grossly unremarkable in appearance. The ovaries are relatively symmetric. No suspicious adnexal masses are seen. A 2.4 cm right adnexal cyst is likely physiologic in nature. Other: No additional soft tissue abnormalities are seen. Musculoskeletal: No acute osseous abnormalities are identified. There is swelling at the quadriceps musculature of the proximal left thigh. Would correlate for associated  bruising or any evidence of rhabdomyolysis. IMPRESSION: 1. Swelling at the quadriceps musculature of the proximal left thigh. Would correlate for any associated bruising or evidence of rhabdomyolysis. 2. Diffuse coronary artery calcifications seen. 3. 3.7 cm cyst at the left axilla may reflect a postoperative seroma, given adjacent clips. 4. Mild bilateral renal atrophy noted. 5. Scattered diverticulosis along the descending and sigmoid colon, without evidence of diverticulitis. 6. Scattered aortic atherosclerosis. Electronically Signed   By: Garald Balding M.D.   On: 07/11/2016 04:20   Ct Cervical Spine Wo Contrast  Result Date: 07/10/2016 CLINICAL DATA:  Status post fall with impact to posterior head. EXAM: CT HEAD WITHOUT CONTRAST CT CERVICAL  SPINE WITHOUT CONTRAST TECHNIQUE: Multidetector CT imaging of the head and cervical spine was performed following the standard protocol without intravenous contrast. Multiplanar CT image reconstructions of the cervical spine were also generated. COMPARISON:  None. FINDINGS: CT HEAD FINDINGS Brain: There is a cystic mass within the anterior right temporal lobe that measures 3.9 x 3.5 cm with adjacent vasogenic edema extending into the right temporal and lower parietal white matter. There is no midline shift or other mass effect. No hydrocephalus. No intracranial hemorrhage or evidence of acute cortical infarct. The remainder the brain parenchyma is normal. No age advanced or lobar predominant atrophy. Vascular: Atherosclerotic calcification of the vertebral and internal carotid arteries at the skullbase is present. Skull: No skull fracture or focal calvarial lesion. Visualized skull base is normal. Sinuses/Orbits: No sinus fluid levels or advanced mucosal thickening. No mastoid effusion. Normal orbits. CT CERVICAL SPINE FINDINGS Alignment: No static subluxation. Facets are aligned. Occipital condyles are normally positioned. Skull base and vertebrae: No acute fracture.  Soft tissues and spinal canal: No prevertebral fluid or swelling. No visible canal hematoma. Disc levels: No advanced spinal canal or neural foraminal stenosis. Upper chest: Left subarticular predominant C5-C6 disc osteophyte complex narrows the left lateral recess and left neural foramen. Other: Normal visualized paraspinal cervical soft tissues. IMPRESSION: 1. Predominantly cystic mass within the anterior right temporal lobe with mild surrounding vasogenic edema. Given the patient's history of breast cancer, this is favored to indicate metastatic disease. A primary CNS neoplasm, such as a glioma, is also a consideration. MRI is recommended for further characterization. 2. No midline shift or mass effect. 3. No acute fracture or static subluxation of the cervical spine. Electronically Signed   By: Ulyses Jarred M.D.   On: 07/10/2016 02:23   Mr Jeri Cos [id number] Contrast  Result Date: 07/10/2016 CLINICAL DATA:  70 y/o  F; brain tumor. EXAM: MRI HEAD WITHOUT AND WITH CONTRAST TECHNIQUE: Multiplanar, multiecho pulse sequences of the brain and surrounding structures were obtained without and with intravenous contrast. Brain lab protocol CONTRAST:  46mL MULTIHANCE GADOBENATE DIMEGLUMINE 529 MG/ML IV SOLN COMPARISON:  07/10/2016 CT head. FINDINGS: Mass centered in the right mid superior temporal lobe with expansion of the superior temporal gyrus measuring 49 x 39 x 31 mm (AP x ML x CC, series 11: Image 8 and 12:33). The mass demonstrates a thin peripheral rim of enhancement with diffusion restriction, a central T2 hyperintense and T1 hypointense fluid collection, thin septations, and small surrounding area of nonenhancing T2 FLAIR hyperintense signal abnormality in white matter consistent with vasogenic edema. Local mass effect effaces the temporal horn of right lateral ventricle and results in minimal right-sided uncal herniation and ambient cistern effacement. No significant midline shift. Diffusion tensor imaging  demonstrates mild medial displacement of the inferior longitudinal fasciculus which abuts the inferomedial margin of the mass (653:23). Additionally, the optic radiations are medially displaced and abut the posterior superiomedial aspect of the mass (653:26). Decreased anisotropy within the surrounding white matter tracts, optic radiations, and inferior longitudinal fasciculus is likely due to vasogenic edema. If the mass is a primary CNS tumor, then nonenhancing tumor infiltration is also possible. IMPRESSION: Cystic mass centered in the mid superior right temporal lobe with thin peripheral enhancement. 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 There is medial displacement of the right inferior longitudinal fasciculus and right optic radiations which abut the medial aspect of tumor as described. Electronically Signed   By: Kristine Garbe M.D.   On: 07/10/2016  20:16   Ct Abdomen Pelvis W Contrast  Result Date: 07/11/2016 CLINICAL DATA:  Status post fall, with generalized chest and abdominal pain. Initial encounter. EXAM: CT CHEST, ABDOMEN, AND PELVIS WITH CONTRAST TECHNIQUE: Multidetector CT imaging of the chest, abdomen and pelvis was performed following the standard protocol during bolus administration of intravenous contrast. CONTRAST:  154mL ISOVUE-300 IOPAMIDOL (ISOVUE-300) INJECTION 61% COMPARISON:  Lumbar spine radiographs performed 07/09/2016 FINDINGS: CT CHEST FINDINGS Cardiovascular: Diffuse coronary artery calcifications are seen. Scattered calcification is seen along the aortic arch and descending thoracic aorta. The heart remains normal in size. The great vessels are grossly unremarkable. Mediastinum/Nodes: The mediastinum is otherwise unremarkable in appearance. No mediastinal lymphadenopathy is seen. No pericardial effusion is identified. The thyroid gland is unremarkable. No axillary lymphadenopathy is seen. A 3.7 cm cyst at the left axilla may reflect a  postoperative seroma, given adjacent clips. Lungs/Pleura: Minimal bibasilar atelectasis is noted. The lungs are otherwise clear. No pleural effusion or pneumothorax is seen. No masses are identified. Musculoskeletal: No acute osseous abnormalities are identified. The visualized musculature is unremarkable in appearance. CT ABDOMEN PELVIS FINDINGS Hepatobiliary: The liver is unremarkable in appearance. The patient is status post cholecystectomy, with clips noted at the gallbladder fossa. The common bile duct remains normal in caliber. Pancreas: The pancreas is within normal limits. Spleen: The spleen is unremarkable in appearance. Adrenals/Urinary Tract: The adrenal glands are unremarkable in appearance. Mild bilateral renal atrophy is noted, with nonspecific bilateral perinephric stranding. There is no evidence of hydronephrosis. No renal or ureteral stones are identified. Stomach/Bowel: The stomach is unremarkable in appearance. The small bowel is within normal limits. The appendix is normal in caliber, without evidence of appendicitis. Scattered diverticulosis is noted along the descending and sigmoid colon, without evidence of diverticulitis. Vascular/Lymphatic: Scattered calcification is seen along the abdominal aorta and its branches. The abdominal aorta is otherwise grossly unremarkable. The inferior vena cava is grossly unremarkable. No retroperitoneal lymphadenopathy is seen. No pelvic sidewall lymphadenopathy is identified. Reproductive: The bladder is mildly distended and within normal limits. The uterus is grossly unremarkable in appearance. The ovaries are relatively symmetric. No suspicious adnexal masses are seen. A 2.4 cm right adnexal cyst is likely physiologic in nature. Other: No additional soft tissue abnormalities are seen. Musculoskeletal: No acute osseous abnormalities are identified. There is swelling at the quadriceps musculature of the proximal left thigh. Would correlate for associated  bruising or any evidence of rhabdomyolysis. IMPRESSION: 1. Swelling at the quadriceps musculature of the proximal left thigh. Would correlate for any associated bruising or evidence of rhabdomyolysis. 2. Diffuse coronary artery calcifications seen. 3. 3.7 cm cyst at the left axilla may reflect a postoperative seroma, given adjacent clips. 4. Mild bilateral renal atrophy noted. 5. Scattered diverticulosis along the descending and sigmoid colon, without evidence of diverticulitis. 6. Scattered aortic atherosclerosis. Electronically Signed   By: Garald Balding M.D.   On: 07/11/2016 04:20   Dg Knee Complete 4 Views Left  Result Date: 07/09/2016 CLINICAL DATA:  Found on floor, fall weakness EXAM: LEFT KNEE - COMPLETE 4+ VIEW COMPARISON:  None. FINDINGS: No acute displaced fracture or dislocation. Mild narrowing of the medial and lateral joint space compartments with bony spurring. Superior and inferior patellar osteophytes. Vascular calcifications. IMPRESSION: No acute osseous abnormality Electronically Signed   By: Donavan Foil M.D.   On: 07/09/2016 23:40     LOS: 4 days   Oren Binet, MD  [facility] Pager:[phone]  If 7PM-7AM, please contact night-coverage [email or web address] Password [email or web address]  07/14/2016, 11:35 AM

## 2016-07-15 ENCOUNTER — Encounter (HOSPITAL_COMMUNITY): Payer: Self-pay | Admitting: Neurological Surgery

## 2016-07-15 MED ORDER — LOSARTAN POTASSIUM 50 MG PO TABS
100.0000 mg | ORAL_TABLET | Freq: Every day | ORAL | Status: DC
  Administered 2016-07-15 – 2016-07-16 (×2): 100 mg via ORAL
  Filled 2016-07-15 (×2): qty 2

## 2016-07-15 MED ORDER — POTASSIUM CHLORIDE ER 10 MEQ PO TBCR
10.0000 meq | EXTENDED_RELEASE_TABLET | Freq: Every day | ORAL | Status: DC
  Administered 2016-07-15 – 2016-07-16 (×2): 10 meq via ORAL
  Filled 2016-07-15 (×4): qty 1

## 2016-07-15 MED ORDER — HYDROCHLOROTHIAZIDE 25 MG PO TABS
25.0000 mg | ORAL_TABLET | Freq: Every day | ORAL | Status: DC
  Administered 2016-07-15 – 2016-07-16 (×2): 25 mg via ORAL
  Filled 2016-07-15 (×2): qty 1

## 2016-07-15 MED ORDER — ENOXAPARIN SODIUM 40 MG/0.4ML ~~LOC~~ SOLN
40.0000 mg | SUBCUTANEOUS | Status: DC
  Administered 2016-07-15 – 2016-07-16 (×2): 40 mg via SUBCUTANEOUS
  Filled 2016-07-15 (×2): qty 0.4

## 2016-07-15 MED ORDER — PANTOPRAZOLE SODIUM 40 MG PO TBEC
40.0000 mg | DELAYED_RELEASE_TABLET | Freq: Every day | ORAL | Status: DC
  Administered 2016-07-15: 40 mg via ORAL
  Filled 2016-07-15: qty 1

## 2016-07-15 MED ORDER — LOSARTAN POTASSIUM-HCTZ 100-25 MG PO TABS: 1.0000 | ORAL_TABLET | Freq: Every day | ORAL | Status: DC

## 2016-07-15 NOTE — Progress Notes (Signed)
No acute events Awake and alert Moving all extremities Non focal Bandage c/d/i Will be presented in tumor board Monday to plan follow up radiation treatment Ok to SNF when accepted Can start prophylactic lovenox now

## 2016-07-15 NOTE — NC FL2 (Signed)
[place] LEVEL OF CARE SCREENING TOOL     IDENTIFICATION  Patient Name: Savannah Howard Birthdate: [birth year] Sex: female Admission Date (Current Location): 07/09/2016  [facility] and [place] Number:  Herbalist and Address:  The . [facility], [place] [address], [place], [place] [address]      Provider Number: [id number]  Attending Physician Name and Address:  Jonetta Osgood, MD  Relative Name and Phone Number:       Current Level of Care: Hospital Recommended Level of Care: [place] Prior Approval Number:    Date Approved/Denied:   PASRR Number: [id number]  Discharge Plan: SNF    Current Diagnoses: Patient Active Problem List   Diagnosis Date Noted  . Pressure injury of skin 07/11/2016  . Fall 07/10/2016  . UTI (urinary tract infection) 07/10/2016  . Rhabdomyolysis 07/10/2016  . Syncope 07/10/2016  . 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 Sepsis ([employer]) 07/10/2016  . Urinary tract infection with hematuria   . Brain tumor ([place])   . Antineoplastic chemotherapy induced anemia 04/02/2015  . Weakness generalized 01/29/2015  . Dehydration 01/28/2015  . Rash 01/28/2015  . Chemotherapy-induced nausea 01/08/2015  . Chemotherapy induced diarrhea 01/08/2015  . Mucositis due to chemotherapy 01/08/2015  . Breast cancer of upper-outer quadrant of left female breast ([place]) 12/20/2014  . Well adult exam 10/26/2012  . Dyslipidemia 06/23/2011  . Hypertension 12/22/2010  . UPPER RESPIRATORY INFECTION, ACUTE 04/30/2010  . Anxiety state 10/28/2009  . VITAMIN D DEFICIENCY 04/24/2009  . HYPERGLYCEMIA 04/24/2009  . VITAMIN B12 DEFICIENCY 10/22/2008  . ALLERGIC RHINITIS 05/09/2007  . GERD 05/09/2007    Orientation RESPIRATION BLADDER Height & Weight     Self, Time, Situation, Place  Normal Continent Weight: 201 lb 15.1 oz (91.6 kg) Height:  5\' 3"  (160 cm)  BEHAVIORAL SYMPTOMS/MOOD NEUROLOGICAL BOWEL NUTRITION  STATUS      Continent Diet (Heart Healthy, Thin Liquids)  AMBULATORY STATUS COMMUNICATION OF NEEDS Skin   Extensive Assist Verbally Normal                       Personal Care Assistance Level of Assistance  Bathing, Feeding, Dressing Bathing Assistance: Limited assistance Feeding assistance: Independent Dressing Assistance: Limited assistance     Functional Limitations Info  Sight, Hearing, Speech Sight Info: Adequate Hearing Info: Adequate Speech Info: Adequate    SPECIAL CARE FACTORS FREQUENCY  PT (By licensed PT), OT (By licensed OT)     PT Frequency: 5x/wk OT Frequency: 5x/wk            Contractures      Additional Factors Info  Code Status, Allergies Code Status Info: DNR Allergies Info:  Penicillins, Sulfonamide Derivatives           Current Medications (07/15/2016):  This is the current hospital active medication list Current Facility-Administered Medications  Medication Dose Route Frequency Provider Last Rate Last Dose  . acetaminophen (TYLENOL) tablet 650 mg  650 mg Oral Q6H PRN Ivor Costa, MD   650 mg at 07/15/16 0720  . anastrozole (ARIMIDEX) tablet 1 mg  1 mg Oral Daily Ivor Costa, MD   1 mg at 07/15/16 0846  . bisacodyl (DULCOLAX) EC tablet 5 mg  5 mg Oral Daily PRN Kevan Ny Ditty, MD      . dexamethasone (DECADRON) tablet 2 mg  2 mg Oral Q6H Shanker Kristeen Mans, MD      . docusate sodium (COLACE) capsule  100 mg  100 mg Oral BID Kevan Ny Ditty, MD   100 mg at 07/15/16 0845  . enoxaparin (LOVENOX) injection 40 mg  40 mg Subcutaneous Q24H Shanker Kristeen Mans, MD      . feeding supplement (ENSURE ENLIVE) (ENSURE ENLIVE) liquid 237 mL  237 mL Oral BID BM Ripudeep K Rai, MD   237 mL at 07/14/16 1432  . hydrALAZINE (APRESOLINE) injection 5-10 mg  5-10 mg Intravenous Q1H PRN Kevan Ny Ditty, MD   10 mg at 07/14/16 0824  . hydrochlorothiazide (HYDRODIURIL) tablet 25 mg  25 mg Oral Daily Shanker Kristeen Mans, MD      . HYDROcodone-acetaminophen  (NORCO/VICODIN) 5-325 MG per tablet 1 tablet  1 tablet Oral Q4H PRN Kevan Ny Ditty, MD      . HYDROmorphone (DILAUDID) injection 0.5-1 mg  0.5-1 mg Intravenous Q2H PRN Kevan Ny Ditty, MD      . levETIRAcetam (KEPPRA) tablet 750 mg  750 mg Oral BID Kevan Ny Ditty, MD   750 mg at 07/15/16 0845  . LORazepam (ATIVAN) tablet 0.5 mg  0.5 mg Oral Q8H PRN Ivor Costa, MD   0.5 mg at 07/11/16 0817  . losartan (COZAAR) tablet 100 mg  100 mg Oral Daily Shanker Kristeen Mans, MD      . metoprolol succinate (TOPROL-XL) 24 hr tablet 25 mg  25 mg Oral Daily Dron Tanna Furry, MD   25 mg at 07/15/16 0845  . naloxone [facility]) injection 0.08 mg  0.08 mg Intravenous PRN Kevan Ny Ditty, MD      . ondansetron [facility]) tablet 4 mg  4 mg Oral Q4H PRN Kevan Ny Ditty, MD       Or  . ondansetron [facility]) injection 4 mg  4 mg Intravenous Q4H PRN Kevan Ny Ditty, MD      . oxyCODONE-acetaminophen (PERCOCET/ROXICET) 5-325 MG per tablet 1 tablet  1 tablet Oral Q4H PRN Ivor Costa, MD   1 tablet at 07/10/16 1813  . pantoprazole (PROTONIX) EC tablet 40 mg  40 mg Oral QHS Reginia Naas, RPH      . potassium chloride (K-DUR) CR tablet 10 mEq  10 mEq Oral Daily Jonetta Osgood, MD      . promethazine (PHENERGAN) tablet 12.5-25 mg  12.5-25 mg Oral Q4H PRN Kevan Ny Ditty, MD      . senna [facility]) tablet 8.6 mg  1 tablet Oral BID Kevan Ny Ditty, MD   8.6 mg at 07/15/16 0846  . sodium chloride flush (NS) 0.9 % injection 10-40 mL  10-40 mL Intracatheter PRN Dron Tanna Furry, MD   30 mL at 07/14/16 2053  . sodium chloride flush (NS) 0.9 % injection 3 mL  3 mL Intravenous Q12H Ivor Costa, MD   3 mL at 07/15/16 0856  . sodium phosphate (FLEET) 7-19 GM/118ML enema 1 enema  1 enema Rectal Once PRN Kevan Ny Ditty, MD      . terazosin (HYTRIN) capsule 5 mg  5 mg Oral QHS Ivor Costa, MD   5 mg at 07/14/16 2201  . vitamin B-12 (CYANOCOBALAMIN) tablet 1,000 mcg  1,000 mcg Oral Daily Ivor Costa, MD   1,000 mcg at 07/15/16 [id number]     Discharge Medications: Please see discharge summary for a list of discharge medications.  Relevant Imaging Results:  Relevant Lab Results:   Additional Information SSN:  [id number]  Darden Dates, LCSW

## 2016-07-15 NOTE — Progress Notes (Signed)
Occupational Therapy Treatment Patient Details Name: Savannah Howard MRN: [id number] DOB: Jul 10, 1946 Today's Date: 07/15/2016    History of present illness Pt is a 70 y/o female admitted secondary to sustaining a fall at home. PMH including but not limited to breast cancer (s/p mastectomy and chemo - 2016) and CKD.  CT head showed cystic mass within the anterior right temporal lobe with mild surrounding vasogenic edema, no midline shift or mass effect. s/p supratentorial craniotomy excision brain tumor.   OT comments  Pt tolerating 20 minutes of ADL at EOB. Requiring very little assist for bed mobility. L LE remains weak. SNF continues to be the appropriate d/c disposition.  Follow Up Recommendations  SNF;Supervision/Assistance - 24 hour    Equipment Recommendations       Recommendations for Other Services      Precautions / Restrictions Precautions Precautions: Fall Restrictions Weight Bearing Restrictions: No       Mobility Bed Mobility Overal bed mobility: Needs Assistance Bed Mobility: Supine to Sit;Sit to Supine     Supine to sit: Min guard;HOB elevated Sit to supine: Min assist   General bed mobility comments: no physical assist for supine to sit, increased time, assisted L LE back in to bed with sit to supine  Transfers Overall transfer level: Needs assistance Equipment used: 1 person hand held assist Transfers: Sit to/from Stand Sit to Stand: Min assist         General transfer comment: assist to rise and stabilize L LE    Balance Overall balance assessment: Needs assistance Sitting-balance support: Feet supported Sitting balance-Leahy Scale: Fair       Standing balance-Leahy Scale: Poor                     ADL Overall ADL's : Needs assistance/impaired     Grooming: Wash/dry hands;Wash/dry face;Oral care;Sitting;Supervision/safety   Upper Body Bathing: Minimal assitance;Sitting       Upper Body Dressing : Supervision/safety;Sitting                      General ADL Comments: Stood and took steps to [facility], awaiting IV placement so did not get to chair.      Vision                     Perception     Praxis      Cognition   Behavior During Therapy: WFL for tasks assessed/performed Overall Cognitive Status: Within Functional Limits for tasks assessed       Memory: Decreased short-term memory               Extremity/Trunk Assessment               Exercises     Shoulder Instructions       General Comments      Pertinent Vitals/ Pain       Pain Assessment: Faces Faces Pain Scale: Hurts a little bit Pain Location: head Pain Descriptors / Indicators: Aching Pain Intervention(s): Monitored during session  Home Living                                          Prior Functioning/Environment              Frequency  Min 2X/week        Progress Toward Goals  OT Goals(current  goals can now be found in the care plan section)  Progress towards OT goals: Progressing toward goals  Acute Rehab OT Goals Patient Stated Goal: to be independent again Time For Goal Achievement: 07/26/16 Potential to Achieve Goals: Good  Plan Discharge plan remains appropriate    Co-evaluation                 End of Session Equipment Utilized During Treatment: Gait belt   Activity Tolerance Patient tolerated treatment well   Patient Left in bed;with call bell/phone within reach;with bed alarm set   Nurse Communication Mobility status        Time: [id number] OT Time Calculation (min): 29 min  Charges: OT General Charges $OT Visit: 1 Procedure OT Treatments $Self Care/Home Management : 23-37 mins  Malka So 07/15/2016, 9:38 AM  [phone]

## 2016-07-15 NOTE — Progress Notes (Signed)
PROGRESS NOTE        PATIENT DETAILS Name: MARIESSA ELZEY Age: 70 y.o. Sex: female Date of Birth: 04-24-1946 Admit Date: 07/09/2016 Admitting Physician Ivor Costa, MD [id number] [name], MD  Brief Narrative: Patient is a 70 y.o. female with hx of left Breast cancer status post neoadjuvant chemotherapy and then underwent mastectomy in October 2016, currently on anastrozole-admitted with possible syncope, further evaluation with CT head/MRI brain showed a cystic mass in the mid superior right temporal lobe. Evaluated by neurosurgery, underwent craniotomy and excision of the brain tumor on 12/4. See below for further details  Subjective: Awake and alert, moving all 4 extremities.  Some pain in her left thigh area.  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 Evaluated by neurosurgery, underwent craniotomy and excision of the mass on 12/4. Await biopsy results. Postop care including dosing of steroids and Keppra deferred to neurosurgery. Currently nonfocal exam.PT eval completed-plans are for SNF.  Rhabdomyolysis: Traumatic in a setting of fall, CK has down trended significantly with supportive care and IV fluids. Continue to follow periodically.  Acute on chronic kidney disease stage III: Acute kidney injury likely hemodynamically mediated and secondary to rhabdomyolysis-creatinine close to usual baseline. Follow periodically.  ? UTI: I doubt sepsis, being monitored off antibiotics. I do not think there is any utility in repeating urine cultures-initial urine culture showed multiple bacterial morphotypes-patient was on IV antimicrobial therapy for a few days.  Hx of left Breast cancer status post neoadjuvant chemotherapy:underwent mastectomy in October 2016, currently on anastrozole-followed by Dr Lindi Adie  Hypertension: Blood  pressure now starting to creep up, continue metoprolol but will resume resume losartan/HCTZ and Terazosin today.  Fall/syncope: Probably neurocardiogenic-current Doppler unremarkable, echo showed normal systolic function. Doubt further workup is necessary.   Left knee pain: Probably secondary to fall, x-rays of the  pelvis and left knee negative. Continue supportive care. She was mobilized with physical therapy yesterday, and claims that she was able to bear some weight without pain. I suspect we could continue to monitor and in the event she more pain and is unable to bear weight-will then proceed with more imaging studies.Marland Kitchen   GERD: PPI  Anxiety: Slightly anxious given her current circumstance of postop care/craniotomy-continue as needed lorazepam.  DVT Prophylaxis: Prophylactic Lovenox started on 12/6-ok with neurosurgery  Code Status: Full code   Family Communication: None at bedside  Disposition Plan: Remain inpatient-SNF on discharge either 12/7 or 12/8  Antimicrobial agents: See below  Procedures: 12/4>>Supratentorial craniotomy for excision of brain tumor   CONSULTS:  Neurosurgery  Time spent: 25- minutes-Greater than 50% of this time was spent in counseling, explanation of diagnosis, planning of further management, and coordination of care.  MEDICATIONS: Anti-infectives    Start     Dose/Rate Route Frequency Ordered Stop   07/13/16 1430  ceFAZolin (ANCEF) IVPB 2g/100 mL premix  Status:  Discontinued     2 g 200 mL/hr over 30 Minutes Intravenous Every 8 hours 07/13/16 1415 07/13/16 1433   07/13/16 0800  bacitracin 50,000 Units in sodium chloride irrigation 0.9 % 500 mL irrigation  Status:  Discontinued       As needed 07/13/16 1011 07/13/16 1019   07/13/16 0700  vancomycin (VANCOCIN) IVPB 1000 mg/200 mL  premix     1,000 mg 200 mL/hr over 60 Minutes Intravenous To [facility] 07/12/16 1246 07/13/16 0857   07/11/16 0800  aztreonam (AZACTAM) 1 g in dextrose 5 %  50 mL IVPB  Status:  Discontinued     1 g 100 mL/hr over 30 Minutes Intravenous Every 8 hours 07/11/16 0731 07/13/16 1520   07/10/16 0400  aztreonam (AZACTAM) 1 g in dextrose 5 % 50 mL IVPB  Status:  Discontinued     1 g 100 mL/hr over 30 Minutes Intravenous Every 12 hours 07/10/16 0329 07/11/16 0731   07/10/16 0115  cefTRIAXone (ROCEPHIN) 1 g in dextrose 5 % 50 mL IVPB  Status:  Discontinued     1 g 100 mL/hr over 30 Minutes Intravenous  Once 07/10/16 0108 07/10/16 0214      Scheduled Meds: . anastrozole  1 mg Oral Daily  . dexamethasone  2 mg Oral Q6H  . docusate sodium  100 mg Oral BID  . enoxaparin (LOVENOX) injection  40 mg Subcutaneous Q24H  . feeding supplement (ENSURE ENLIVE)  237 mL Oral BID BM  . levETIRAcetam  750 mg Oral BID  . metoprolol succinate  25 mg Oral Daily  . pantoprazole (PROTONIX) IV  40 mg Intravenous QHS  . senna  1 tablet Oral BID  . sodium chloride flush  3 mL Intravenous Q12H  . terazosin  5 mg Oral QHS  . vitamin B-12  1,000 mcg Oral Daily   Continuous Infusions:  PRN Meds:.acetaminophen, bisacodyl, hydrALAZINE, HYDROcodone-acetaminophen, HYDROmorphone (DILAUDID) injection, LORazepam, naLOXone (NARCAN)  injection, ondansetron **OR** ondansetron (ZOFRAN) IV, oxyCODONE-acetaminophen, promethazine, sodium chloride flush, sodium phosphate   PHYSICAL EXAM: Vital signs: Vitals:   07/14/16 2113 07/15/16 0148 07/15/16 0658 07/15/16 0900  BP: (!) 144/45 (!) 157/57 (!) 163/57 (!) 173/51  Pulse: 67 62 (!) 54 63  Resp: 20 16 16 17   Temp: 99.3 F (37.4 C) 98.2 F (36.8 C) 98.3 F (36.8 C) 98.3 F (36.8 C)  TempSrc: Oral Oral Oral Oral  SpO2: 99% 98% 98% 99%  Weight: 91.6 kg (201 lb 15.1 oz)     Height:       Filed Weights   07/13/16 1400 07/14/16 0500 07/14/16 2113  Weight: 91.2 kg (201 lb 1 oz) 91 kg (200 lb 9.9 oz) 91.6 kg (201 lb 15.1 oz)   Body mass index is 35.77 kg/m.   General appearance :Awake, alert, not in any distress. Dressing in  scalp in place. Dry. Eyes:, pupils equally reactive to light and accomodationa Neck: supple, no JVD. No cervical lymphadenopathy. Resp:Good air entry bilaterally, no added sounds  CVS: S1 S2 regular, no murmurs.  GI: Bowel sounds present, Non tender and not distended with no gaurding, rigidity or rebound.No organomegaly Extremities: B/L Lower Ext shows no edema, both legs are warm to touch Neurology:  speech clear, moves all 4 extremities Musculoskeletal:gait appears to be normal.No digital cyanosis Skin:No Rash, warm and dry Wounds:N/A  I have personally reviewed following labs and imaging studies  LABORATORY DATA: CBC:  Recent Labs Lab 07/09/16 2339 07/14/16 0530  WBC 16.3* 11.4*  NEUTROABS 13.5*  --   HGB 15.3* 9.5*  HCT 44.4 28.8*  MCV 91.5 94.7  PLT 222 [id number]    Basic Metabolic Panel:  Recent Labs Lab 07/09/16 2339 07/10/16 1236 07/11/16 0517 07/13/16 0528 07/14/16 0530  NA 136 136 133* 135 136  K 3.6 4.0 3.8 4.0 3.7  CL 99* 103 100* 103 102  CO2 21* 23 24  24 24  GLUCOSE 128* 145* 145* 132* 133*  BUN 34* 31* 31* 36* 27*  CREATININE 1.55* 1.36* 1.26* 1.14* 0.99  CALCIUM 9.7 8.8* 8.3* 8.2* 8.1*    GFR: Estimated Creatinine Clearance: 56 mL/min (by C-G formula based on SCr of 0.99 mg/dL).  Liver Function Tests:  Recent Labs Lab 07/09/16 2339  AST 71*  ALT 22  ALKPHOS 68  BILITOT 0.9  PROT 7.6  ALBUMIN 4.1   No results for input(s): LIPASE, AMYLASE in the last 168 hours. No results for input(s): AMMONIA in the last 168 hours.  Coagulation Profile: No results for input(s): INR, PROTIME in the last 168 hours.  Cardiac Enzymes:  Recent Labs Lab 07/09/16 2339 07/10/16 0420 07/11/16 0625 07/13/16 0528  CKTOTAL 10,370* 9,810* 3,599* 2,244*  TROPONINI  --  0.78*  --   --     BNP (last 3 results) No results for input(s): PROBNP in the last 8760 hours.  HbA1C: No results for input(s): HGBA1C in the last 72 hours.  CBG:  Recent Labs Lab  07/10/16 0647 07/11/16 0617 07/13/16 0622 07/14/16 0559  GLUCAP 137* 140* 139* 134*    Lipid Profile: No results for input(s): CHOL, HDL, LDLCALC, TRIG, CHOLHDL, LDLDIRECT in the last 72 hours.  Thyroid Function Tests: No results for input(s): TSH, T4TOTAL, FREET4, T3FREE, THYROIDAB in the last 72 hours.  Anemia Panel: No results for input(s): VITAMINB12, FOLATE, FERRITIN, TIBC, IRON, RETICCTPCT in the last 72 hours.  Urine analysis:    Component Value Date/Time   COLORURINE YELLOW 07/09/2016 2255   APPEARANCEUR CLOUDY (A) 07/09/2016 2255   LABSPEC 1.019 07/09/2016 2255   PHURINE 5.0 07/09/2016 2255   GLUCOSEU NEGATIVE 07/09/2016 2255   GLUCOSEU NEGATIVE 11/05/2014 1456   HGBUR LARGE (A) 07/09/2016 2255   BILIRUBINUR NEGATIVE 07/09/2016 2255   KETONESUR 15 (A) 07/09/2016 2255   PROTEINUR 30 (A) 07/09/2016 2255   UROBILINOGEN 0.2 11/05/2014 1456   NITRITE NEGATIVE 07/09/2016 2255   LEUKOCYTESUR MODERATE (A) 07/09/2016 2255    Sepsis Labs: Lactic Acid, Venous    Component Value Date/Time   LATICACIDVEN 1.6 07/10/2016 0408    MICROBIOLOGY: Recent Results (from the past 240 hour(s))  Urine culture     Status: Abnormal   Collection Time: 07/09/16 10:55 PM  Result Value Ref Range Status   Specimen Description URINE, CLEAN CATCH  Final   Special Requests NONE  Final   Culture MULTIPLE SPECIES PRESENT, SUGGEST RECOLLECTION (A)  Final   Report Status 07/11/2016 FINAL  Final  Surgical PCR screen     Status: None   Collection Time: 07/12/16  8:18 PM  Result Value Ref Range Status   MRSA, PCR NEGATIVE NEGATIVE Final   Staphylococcus aureus NEGATIVE NEGATIVE Final    Comment:        The Xpert SA Assay (FDA approved for NASAL specimens in patients over 9 years of age), is one component of a comprehensive surveillance program.  Test performance has been validated by [facility] for patients greater than or equal to 75 year old. It is not intended to diagnose  infection nor to guide or monitor treatment.     RADIOLOGY STUDIES/RESULTS: Dg Chest 1 View  Result Date: 07/09/2016 CLINICAL DATA:  Found on floor after fall EXAM: CHEST 1 VIEW COMPARISON:  12/31/2014 FINDINGS: A right-sided chest port is in place, the superior portion of the catheter is not included on the image. The tip of the catheter projects over expected location of SVC. Surgical clips  in the left axilla and within the right upper quadrant. There is mild cardiomegaly without overt failure. Mediastinal contour is exaggerated by patient rotation. Mild atelectasis left base. No acute infiltrate. No pneumothorax. IMPRESSION: 1. Mild cardiomegaly without overt failure 2. Mild atelectasis at the left lung base. Electronically Signed   By: Donavan Foil M.D.   On: 07/09/2016 23:38   Dg Chest 2 View  Result Date: 07/13/2016 CLINICAL DATA:  Preoperative chest radiograph.  Initial encounter. EXAM: CHEST  2 VIEW COMPARISON:  Chest radiograph performed 07/09/2016, and CT of the chest performed 07/11/2016 FINDINGS: The lungs are mildly hypoexpanded, with minimal bilateral atelectasis. There is no evidence of pleural effusion or pneumothorax. The heart is enlarged. A right-sided chest port is noted ending about the mid SVC. No acute osseous abnormalities are seen. Scattered clips are noted overlying the left axilla. Clips are noted within the right upper quadrant, reflecting prior cholecystectomy. IMPRESSION: Lungs mildly hypoexpanded, with minimal bilateral atelectasis. Cardiomegaly. Electronically Signed   By: Garald Balding M.D.   On: 07/13/2016 01:37   Dg Lumbar Spine Complete  Result Date: 07/09/2016 CLINICAL DATA:  Fall extremity weakness EXAM: LUMBAR SPINE - COMPLETE 4+ VIEW COMPARISON:  None. FINDINGS: Surgical clips are present in the right upper quadrant. There are 5 non rib-bearing lumbar type vertebra. Calcified pelvic phleboliths. Lumbar alignment within normal limits. Severe narrowing at  L5-S1 with endplate changes. Moderate narrowing at L1-L2. The set degenerative changes of the lower lumbar spine. Atherosclerosis of the aorta. IMPRESSION: Degenerative changes.  No acute osseous abnormality. Electronically Signed   By: Donavan Foil M.D.   On: 07/09/2016 23:42   Dg Pelvis 1-2 Views  Result Date: 07/09/2016 CLINICAL DATA:  Found down, fall with leg weakness EXAM: PELVIS - 1-2 VIEW COMPARISON:  None. FINDINGS: SI joints are symmetric. Pubic symphysis is intact. No fracture or dislocation. IMPRESSION: No acute osseous abnormality Electronically Signed   By: Donavan Foil M.D.   On: 07/09/2016 23:45   Dg Shoulder Right  Result Date: 07/09/2016 CLINICAL DATA:  Fall EXAM: RIGHT SHOULDER - 2+ VIEW COMPARISON:  None. FINDINGS: There is no evidence of fracture or dislocation. There is no evidence of arthropathy or other focal bone abnormality. Soft tissues are unremarkable. IMPRESSION: Negative. Electronically Signed   By: Donavan Foil M.D.   On: 07/09/2016 23:45   Ct Head Wo Contrast  Result Date: 07/10/2016 CLINICAL DATA:  Status post fall with impact to posterior head. EXAM: CT HEAD WITHOUT CONTRAST CT CERVICAL SPINE WITHOUT CONTRAST TECHNIQUE: Multidetector CT imaging of the head and cervical spine was performed following the standard protocol without intravenous contrast. Multiplanar CT image reconstructions of the cervical spine were also generated. COMPARISON:  None. FINDINGS: CT HEAD FINDINGS Brain: There is a cystic mass within the anterior right temporal lobe that measures 3.9 x 3.5 cm with adjacent vasogenic edema extending into the right temporal and lower parietal white matter. There is no midline shift or other mass effect. No hydrocephalus. No intracranial hemorrhage or evidence of acute cortical infarct. The remainder the brain parenchyma is normal. No age advanced or lobar predominant atrophy. Vascular: Atherosclerotic calcification of the vertebral and internal carotid  arteries at the skullbase is present. Skull: No skull fracture or focal calvarial lesion. Visualized skull base is normal. Sinuses/Orbits: No sinus fluid levels or advanced mucosal thickening. No mastoid effusion. Normal orbits. CT CERVICAL SPINE FINDINGS Alignment: No static subluxation. Facets are aligned. Occipital condyles are normally positioned. Skull base and vertebrae: No acute  fracture. Soft tissues and spinal canal: No prevertebral fluid or swelling. No visible canal hematoma. Disc levels: No advanced spinal canal or neural foraminal stenosis. Upper chest: Left subarticular predominant C5-C6 disc osteophyte complex narrows the left lateral recess and left neural foramen. Other: Normal visualized paraspinal cervical soft tissues. IMPRESSION: 1. Predominantly cystic mass within the anterior right temporal lobe with mild surrounding vasogenic edema. Given the patient's history of breast cancer, this is favored to indicate metastatic disease. A primary CNS neoplasm, such as a glioma, is also a consideration. MRI is recommended for further characterization. 2. No midline shift or mass effect. 3. No acute fracture or static subluxation of the cervical spine. Electronically Signed   By: Ulyses Jarred M.D.   On: 07/10/2016 02:23   Ct Chest W Contrast  Result Date: 07/11/2016 CLINICAL DATA:  Status post fall, with generalized chest and abdominal pain. Initial encounter. EXAM: CT CHEST, ABDOMEN, AND PELVIS WITH CONTRAST TECHNIQUE: Multidetector CT imaging of the chest, abdomen and pelvis was performed following the standard protocol during bolus administration of intravenous contrast. CONTRAST:  138mL ISOVUE-300 IOPAMIDOL (ISOVUE-300) INJECTION 61% COMPARISON:  Lumbar spine radiographs performed 07/09/2016 FINDINGS: CT CHEST FINDINGS Cardiovascular: Diffuse coronary artery calcifications are seen. Scattered calcification is seen along the aortic arch and descending thoracic aorta. The heart remains normal in  size. The great vessels are grossly unremarkable. Mediastinum/Nodes: The mediastinum is otherwise unremarkable in appearance. No mediastinal lymphadenopathy is seen. No pericardial effusion is identified. The thyroid gland is unremarkable. No axillary lymphadenopathy is seen. A 3.7 cm cyst at the left axilla may reflect a postoperative seroma, given adjacent clips. Lungs/Pleura: Minimal bibasilar atelectasis is noted. The lungs are otherwise clear. No pleural effusion or pneumothorax is seen. No masses are identified. Musculoskeletal: No acute osseous abnormalities are identified. The visualized musculature is unremarkable in appearance. CT ABDOMEN PELVIS FINDINGS Hepatobiliary: The liver is unremarkable in appearance. The patient is status post cholecystectomy, with clips noted at the gallbladder fossa. The common bile duct remains normal in caliber. Pancreas: The pancreas is within normal limits. Spleen: The spleen is unremarkable in appearance. Adrenals/Urinary Tract: The adrenal glands are unremarkable in appearance. Mild bilateral renal atrophy is noted, with nonspecific bilateral perinephric stranding. There is no evidence of hydronephrosis. No renal or ureteral stones are identified. Stomach/Bowel: The stomach is unremarkable in appearance. The small bowel is within normal limits. The appendix is normal in caliber, without evidence of appendicitis. Scattered diverticulosis is noted along the descending and sigmoid colon, without evidence of diverticulitis. Vascular/Lymphatic: Scattered calcification is seen along the abdominal aorta and its branches. The abdominal aorta is otherwise grossly unremarkable. The inferior vena cava is grossly unremarkable. No retroperitoneal lymphadenopathy is seen. No pelvic sidewall lymphadenopathy is identified. Reproductive: The bladder is mildly distended and within normal limits. The uterus is grossly unremarkable in appearance. The ovaries are relatively symmetric. No  suspicious adnexal masses are seen. A 2.4 cm right adnexal cyst is likely physiologic in nature. Other: No additional soft tissue abnormalities are seen. Musculoskeletal: No acute osseous abnormalities are identified. There is swelling at the quadriceps musculature of the proximal left thigh. Would correlate for associated bruising or any evidence of rhabdomyolysis. IMPRESSION: 1. Swelling at the quadriceps musculature of the proximal left thigh. Would correlate for any associated bruising or evidence of rhabdomyolysis. 2. Diffuse coronary artery calcifications seen. 3. 3.7 cm cyst at the left axilla may reflect a postoperative seroma, given adjacent clips. 4. Mild bilateral renal atrophy noted. 5. Scattered diverticulosis along  the descending and sigmoid colon, without evidence of diverticulitis. 6. Scattered aortic atherosclerosis. Electronically Signed   By: Garald Balding M.D.   On: 07/11/2016 04:20   Ct Cervical Spine Wo Contrast  Result Date: 07/10/2016 CLINICAL DATA:  Status post fall with impact to posterior head. EXAM: CT HEAD WITHOUT CONTRAST CT CERVICAL SPINE WITHOUT CONTRAST TECHNIQUE: Multidetector CT imaging of the head and cervical spine was performed following the standard protocol without intravenous contrast. Multiplanar CT image reconstructions of the cervical spine were also generated. COMPARISON:  None. FINDINGS: CT HEAD FINDINGS Brain: There is a cystic mass within the anterior right temporal lobe that measures 3.9 x 3.5 cm with adjacent vasogenic edema extending into the right temporal and lower parietal white matter. There is no midline shift or other mass effect. No hydrocephalus. No intracranial hemorrhage or evidence of acute cortical infarct. The remainder the brain parenchyma is normal. No age advanced or lobar predominant atrophy. Vascular: Atherosclerotic calcification of the vertebral and internal carotid arteries at the skullbase is present. Skull: No skull fracture or focal  calvarial lesion. Visualized skull base is normal. Sinuses/Orbits: No sinus fluid levels or advanced mucosal thickening. No mastoid effusion. Normal orbits. CT CERVICAL SPINE FINDINGS Alignment: No static subluxation. Facets are aligned. Occipital condyles are normally positioned. Skull base and vertebrae: No acute fracture. Soft tissues and spinal canal: No prevertebral fluid or swelling. No visible canal hematoma. Disc levels: No advanced spinal canal or neural foraminal stenosis. Upper chest: Left subarticular predominant C5-C6 disc osteophyte complex narrows the left lateral recess and left neural foramen. Other: Normal visualized paraspinal cervical soft tissues. IMPRESSION: 1. Predominantly cystic mass within the anterior right temporal lobe with mild surrounding vasogenic edema. Given the patient's history of breast cancer, this is favored to indicate metastatic disease. A primary CNS neoplasm, such as a glioma, is also a consideration. MRI is recommended for further characterization. 2. No midline shift or mass effect. 3. No acute fracture or static subluxation of the cervical spine. Electronically Signed   By: Ulyses Jarred M.D.   On: 07/10/2016 02:23   Mr Jeri Cos [id number] Contrast  Result Date: 07/14/2016 CLINICAL DATA:  Resection of cystic neoplasm yesterday. EXAM: MRI HEAD WITHOUT AND WITH CONTRAST TECHNIQUE: Multiplanar, multiecho pulse sequences of the brain and surrounding structures were obtained without and with intravenous contrast. CONTRAST:  44mL MULTIHANCE GADOBENATE DIMEGLUMINE 529 MG/ML IV SOLN COMPARISON:  MRI of the brain 07/10/16. FINDINGS: Brain: A right temporal craniotomy is noted. The peripherally enhancing cystic lesion has been resected. Blood products are evident within the surgical cavity. There is residual enhancement along the anterior and superior margin of the surgical cavity. The longest contiguous area of enhancement is 3.4 cm. There is also some restricted diffusion along the  posterior margin of the surgical cavity. Posterior T2 changes are again noted. A lesion in the high left parietal lobe measures 13 x 10 x 10 mm. No other focal enhancing lesions are present. White matter changes are otherwise stable. Vascular: Flow is present in the major intracranial arteries. Skull and upper cervical spine: The skullbase is within normal limits. Calvarium is otherwise intact. Marrow signal is normal. The craniocervical junction is within normal limits. Sinuses/Orbits: Mild mucosal thickening is present within the anterior ethmoid air cells in the inferior right frontal sinus. Mastoid air cells are clear. The globes and orbits are intact IMPRESSION: 1. Right temporal craniotomy for resection of temporal lobe tumor, likely metastasis. 2. Residual enhancement along the anterior and superior margin  of the resection cavity likely reflects some residual tumor. 3. Restricted diffusion along the posterior margin of the surgical cavity likely reflects infarct. There is no associated enhancement. 4. 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 5. Diffuse white matter disease is stable. These results were called by telephone at the time of interpretation on 07/14/2016 at 2:00 pm to Dr. Cyndy Freeze , who verbally acknowledged these results. Electronically Signed   By: San Morelle M.D.   On: 07/14/2016 14:04   Mr Jeri Cos [id number] Contrast  Result Date: 07/10/2016 CLINICAL DATA:  70 y/o  F; brain tumor. EXAM: MRI HEAD WITHOUT AND WITH CONTRAST TECHNIQUE: Multiplanar, multiecho pulse sequences of the brain and surrounding structures were obtained without and with intravenous contrast. Brain lab protocol CONTRAST:  104mL MULTIHANCE GADOBENATE DIMEGLUMINE 529 MG/ML IV SOLN COMPARISON:  07/10/2016 CT head. FINDINGS: Mass centered in the right mid superior temporal lobe with expansion of the superior temporal gyrus measuring 49 x 39 x 31 mm (AP x ML x CC, series 11: Image 8 and 12:33). The mass  demonstrates a thin peripheral rim of enhancement with diffusion restriction, a central T2 hyperintense and T1 hypointense fluid collection, thin septations, and small surrounding area of nonenhancing T2 FLAIR hyperintense signal abnormality in white matter consistent with vasogenic edema. Local mass effect effaces the temporal horn of right lateral ventricle and results in minimal right-sided uncal herniation and ambient cistern effacement. No significant midline shift. Diffusion tensor imaging demonstrates mild medial displacement of the inferior longitudinal fasciculus which abuts the inferomedial margin of the mass (653:23). Additionally, the optic radiations are medially displaced and abut the posterior superiomedial aspect of the mass (653:26). Decreased anisotropy within the surrounding white matter tracts, optic radiations, and inferior longitudinal fasciculus is likely due to vasogenic edema. If the mass is a primary CNS tumor, then nonenhancing tumor infiltration is also possible. IMPRESSION: Cystic mass centered in the mid superior right temporal lobe with thin peripheral enhancement. 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 There is medial displacement of the right inferior longitudinal fasciculus and right optic radiations which abut the medial aspect of tumor as described. Electronically Signed   By: Kristine Garbe M.D.   On: 07/10/2016 20:16   Ct Abdomen Pelvis W Contrast  Result Date: 07/11/2016 CLINICAL DATA:  Status post fall, with generalized chest and abdominal pain. Initial encounter. EXAM: CT CHEST, ABDOMEN, AND PELVIS WITH CONTRAST TECHNIQUE: Multidetector CT imaging of the chest, abdomen and pelvis was performed following the standard protocol during bolus administration of intravenous contrast. CONTRAST:  163mL ISOVUE-300 IOPAMIDOL (ISOVUE-300) INJECTION 61% COMPARISON:  Lumbar spine radiographs performed 07/09/2016 FINDINGS: CT CHEST FINDINGS  Cardiovascular: Diffuse coronary artery calcifications are seen. Scattered calcification is seen along the aortic arch and descending thoracic aorta. The heart remains normal in size. The great vessels are grossly unremarkable. Mediastinum/Nodes: The mediastinum is otherwise unremarkable in appearance. No mediastinal lymphadenopathy is seen. No pericardial effusion is identified. The thyroid gland is unremarkable. No axillary lymphadenopathy is seen. A 3.7 cm cyst at the left axilla may reflect a postoperative seroma, given adjacent clips. Lungs/Pleura: Minimal bibasilar atelectasis is noted. The lungs are otherwise clear. No pleural effusion or pneumothorax is seen. No masses are identified. Musculoskeletal: No acute osseous abnormalities are identified. The visualized musculature is unremarkable in appearance. CT ABDOMEN PELVIS FINDINGS Hepatobiliary: The liver is unremarkable in appearance. The patient is status post cholecystectomy, with clips noted at the gallbladder fossa. The common bile duct remains normal  in caliber. Pancreas: The pancreas is within normal limits. Spleen: The spleen is unremarkable in appearance. Adrenals/Urinary Tract: The adrenal glands are unremarkable in appearance. Mild bilateral renal atrophy is noted, with nonspecific bilateral perinephric stranding. There is no evidence of hydronephrosis. No renal or ureteral stones are identified. Stomach/Bowel: The stomach is unremarkable in appearance. The small bowel is within normal limits. The appendix is normal in caliber, without evidence of appendicitis. Scattered diverticulosis is noted along the descending and sigmoid colon, without evidence of diverticulitis. Vascular/Lymphatic: Scattered calcification is seen along the abdominal aorta and its branches. The abdominal aorta is otherwise grossly unremarkable. The inferior vena cava is grossly unremarkable. No retroperitoneal lymphadenopathy is seen. No pelvic sidewall lymphadenopathy is  identified. Reproductive: The bladder is mildly distended and within normal limits. The uterus is grossly unremarkable in appearance. The ovaries are relatively symmetric. No suspicious adnexal masses are seen. A 2.4 cm right adnexal cyst is likely physiologic in nature. Other: No additional soft tissue abnormalities are seen. Musculoskeletal: No acute osseous abnormalities are identified. There is swelling at the quadriceps musculature of the proximal left thigh. Would correlate for associated bruising or any evidence of rhabdomyolysis. IMPRESSION: 1. Swelling at the quadriceps musculature of the proximal left thigh. Would correlate for any associated bruising or evidence of rhabdomyolysis. 2. Diffuse coronary artery calcifications seen. 3. 3.7 cm cyst at the left axilla may reflect a postoperative seroma, given adjacent clips. 4. Mild bilateral renal atrophy noted. 5. Scattered diverticulosis along the descending and sigmoid colon, without evidence of diverticulitis. 6. Scattered aortic atherosclerosis. Electronically Signed   By: Garald Balding M.D.   On: 07/11/2016 04:20   Dg Knee Complete 4 Views Left  Result Date: 07/09/2016 CLINICAL DATA:  Found on floor, fall weakness EXAM: LEFT KNEE - COMPLETE 4+ VIEW COMPARISON:  None. FINDINGS: No acute displaced fracture or dislocation. Mild narrowing of the medial and lateral joint space compartments with bony spurring. Superior and inferior patellar osteophytes. Vascular calcifications. IMPRESSION: No acute osseous abnormality Electronically Signed   By: Donavan Foil M.D.   On: 07/09/2016 23:40     LOS: 5 days   Oren Binet, MD  [facility] Pager:[phone]  If 7PM-7AM, please contact night-coverage [email or web address] Password [email or web address] 07/15/2016, 11:25 AM

## 2016-07-16 DIAGNOSIS — I517 Cardiomegaly: Secondary | ICD-10-CM | POA: Diagnosis not present

## 2016-07-16 DIAGNOSIS — Z9221 Personal history of antineoplastic chemotherapy: Secondary | ICD-10-CM | POA: Diagnosis not present

## 2016-07-16 DIAGNOSIS — C712 Malignant neoplasm of temporal lobe: Secondary | ICD-10-CM | POA: Diagnosis not present

## 2016-07-16 DIAGNOSIS — R259 Unspecified abnormal involuntary movements: Secondary | ICD-10-CM | POA: Diagnosis not present

## 2016-07-16 DIAGNOSIS — D496 Neoplasm of unspecified behavior of brain: Secondary | ICD-10-CM

## 2016-07-16 DIAGNOSIS — R2681 Unsteadiness on feet: Secondary | ICD-10-CM | POA: Diagnosis not present

## 2016-07-16 DIAGNOSIS — I129 Hypertensive chronic kidney disease with stage 1 through stage 4 chronic kidney disease, or unspecified chronic kidney disease: Secondary | ICD-10-CM | POA: Diagnosis not present

## 2016-07-16 DIAGNOSIS — D649 Anemia, unspecified: Secondary | ICD-10-CM | POA: Diagnosis not present

## 2016-07-16 DIAGNOSIS — Z48811 Encounter for surgical aftercare following surgery on the nervous system: Secondary | ICD-10-CM | POA: Diagnosis not present

## 2016-07-16 DIAGNOSIS — Z880 Allergy status to penicillin: Secondary | ICD-10-CM | POA: Diagnosis not present

## 2016-07-16 DIAGNOSIS — E559 Vitamin D deficiency, unspecified: Secondary | ICD-10-CM | POA: Diagnosis not present

## 2016-07-16 DIAGNOSIS — E669 Obesity, unspecified: Secondary | ICD-10-CM | POA: Diagnosis not present

## 2016-07-16 DIAGNOSIS — M6281 Muscle weakness (generalized): Secondary | ICD-10-CM | POA: Diagnosis not present

## 2016-07-16 DIAGNOSIS — F419 Anxiety disorder, unspecified: Secondary | ICD-10-CM | POA: Diagnosis not present

## 2016-07-16 DIAGNOSIS — Z170 Estrogen receptor positive status [ER+]: Secondary | ICD-10-CM | POA: Diagnosis not present

## 2016-07-16 DIAGNOSIS — K219 Gastro-esophageal reflux disease without esophagitis: Secondary | ICD-10-CM | POA: Diagnosis not present

## 2016-07-16 DIAGNOSIS — N183 Chronic kidney disease, stage 3 (moderate): Secondary | ICD-10-CM | POA: Diagnosis not present

## 2016-07-16 DIAGNOSIS — M8580 Other specified disorders of bone density and structure, unspecified site: Secondary | ICD-10-CM | POA: Diagnosis not present

## 2016-07-16 DIAGNOSIS — Z79811 Long term (current) use of aromatase inhibitors: Secondary | ICD-10-CM | POA: Diagnosis not present

## 2016-07-16 DIAGNOSIS — C50412 Malignant neoplasm of upper-outer quadrant of left female breast: Secondary | ICD-10-CM | POA: Diagnosis not present

## 2016-07-16 DIAGNOSIS — Z510 Encounter for antineoplastic radiation therapy: Secondary | ICD-10-CM | POA: Diagnosis not present

## 2016-07-16 DIAGNOSIS — I119 Hypertensive heart disease without heart failure: Secondary | ICD-10-CM | POA: Diagnosis not present

## 2016-07-16 DIAGNOSIS — C50912 Malignant neoplasm of unspecified site of left female breast: Secondary | ICD-10-CM | POA: Diagnosis not present

## 2016-07-16 DIAGNOSIS — N179 Acute kidney failure, unspecified: Secondary | ICD-10-CM | POA: Diagnosis not present

## 2016-07-16 DIAGNOSIS — C50919 Malignant neoplasm of unspecified site of unspecified female breast: Secondary | ICD-10-CM | POA: Diagnosis not present

## 2016-07-16 DIAGNOSIS — B9689 Other specified bacterial agents as the cause of diseases classified elsewhere: Secondary | ICD-10-CM | POA: Diagnosis not present

## 2016-07-16 DIAGNOSIS — R55 Syncope and collapse: Secondary | ICD-10-CM | POA: Diagnosis not present

## 2016-07-16 DIAGNOSIS — M25562 Pain in left knee: Secondary | ICD-10-CM | POA: Diagnosis not present

## 2016-07-16 DIAGNOSIS — T796XXD Traumatic ischemia of muscle, subsequent encounter: Secondary | ICD-10-CM | POA: Diagnosis not present

## 2016-07-16 DIAGNOSIS — R2689 Other abnormalities of gait and mobility: Secondary | ICD-10-CM | POA: Diagnosis not present

## 2016-07-16 DIAGNOSIS — M6282 Rhabdomyolysis: Secondary | ICD-10-CM | POA: Diagnosis not present

## 2016-07-16 DIAGNOSIS — E538 Deficiency of other specified B group vitamins: Secondary | ICD-10-CM | POA: Diagnosis not present

## 2016-07-16 DIAGNOSIS — Z9181 History of falling: Secondary | ICD-10-CM | POA: Diagnosis not present

## 2016-07-16 DIAGNOSIS — N170 Acute kidney failure with tubular necrosis: Secondary | ICD-10-CM | POA: Diagnosis not present

## 2016-07-16 DIAGNOSIS — M5136 Other intervertebral disc degeneration, lumbar region: Secondary | ICD-10-CM | POA: Diagnosis not present

## 2016-07-16 DIAGNOSIS — C7949 Secondary malignant neoplasm of other parts of nervous system: Secondary | ICD-10-CM | POA: Diagnosis not present

## 2016-07-16 DIAGNOSIS — Z87442 Personal history of urinary calculi: Secondary | ICD-10-CM | POA: Diagnosis not present

## 2016-07-16 DIAGNOSIS — F4320 Adjustment disorder, unspecified: Secondary | ICD-10-CM | POA: Diagnosis not present

## 2016-07-16 DIAGNOSIS — I10 Essential (primary) hypertension: Secondary | ICD-10-CM | POA: Diagnosis not present

## 2016-07-16 DIAGNOSIS — C50911 Malignant neoplasm of unspecified site of right female breast: Secondary | ICD-10-CM | POA: Diagnosis not present

## 2016-07-16 DIAGNOSIS — N390 Urinary tract infection, site not specified: Secondary | ICD-10-CM | POA: Diagnosis not present

## 2016-07-16 DIAGNOSIS — C7931 Secondary malignant neoplasm of brain: Secondary | ICD-10-CM | POA: Diagnosis not present

## 2016-07-16 LAB — GLUCOSE, CAPILLARY: GLUCOSE-CAPILLARY: 107 mg/dL — AB (ref 65–99)

## 2016-07-16 MED ORDER — LORAZEPAM 0.5 MG PO TABS: 0.5000 mg | ORAL_TABLET | Freq: Two times a day (BID) | ORAL | 0 refills | Status: DC

## 2016-07-16 MED ORDER — DEXAMETHASONE 2 MG PO TABS: 2.0000 mg | ORAL_TABLET | Freq: Four times a day (QID) | ORAL | Status: DC

## 2016-07-16 MED ORDER — LEVETIRACETAM 750 MG PO TABS: 750.0000 mg | ORAL_TABLET | Freq: Two times a day (BID) | ORAL | Status: DC

## 2016-07-16 MED ORDER — HEPARIN SOD (PORK) LOCK FLUSH 100 UNIT/ML IV SOLN
500.0000 [IU] | INTRAVENOUS | Status: AC | PRN
  Administered 2016-07-16: 500 [IU]

## 2016-07-16 MED ORDER — PHENOL 1.4 % MT LIQD
1.0000 | OROMUCOSAL | Status: DC | PRN
  Administered 2016-07-16: 1 via OROMUCOSAL
  Filled 2016-07-16: qty 177

## 2016-07-16 MED ORDER — DOCUSATE SODIUM 100 MG PO CAPS: 100.0000 mg | ORAL_CAPSULE | Freq: Two times a day (BID) | ORAL | 0 refills | Status: DC

## 2016-07-16 MED ORDER — OXYCODONE-ACETAMINOPHEN 5-325 MG PO TABS: 1.0000 | ORAL_TABLET | Freq: Three times a day (TID) | ORAL | 0 refills | Status: DC | PRN

## 2016-07-16 NOTE — Care Management Note (Signed)
Case Management Note  Patient Details  Name: Savannah Howard MRN: [id number] Date of Birth: Jul 26, 1946  Subjective/Objective:                    Action/Plan: Pt discharging to [facility]. No further needs per CM.   Expected Discharge Date:                  Expected Discharge Plan:  Skilled Nursing Facility  In-House Referral:  Clinical Social Work  Discharge planning Services  CM Consult  Post Acute Care Choice:    Choice offered to:     DME Arranged:    DME Agency:     HH Arranged:    [place] Agency:     Status of Service:  Completed, signed off  If discussed at [employer], dates discussed:    Additional Comments:  Pollie Friar, RN 07/16/2016, 2:49 PM

## 2016-07-16 NOTE — Progress Notes (Signed)
Physical Therapy Treatment Patient Details Name: Savannah Howard MRN: [id number] DOB: 09-05-1945 Today's Date: 07/16/2016    History of Present Illness Pt is a 70 y/o female admitted secondary to sustaining a fall at home. PMH including but not limited to breast cancer (s/p mastectomy and chemo - 2016) and CKD.  CT head showed cystic mass within the anterior right temporal lobe with mild surrounding vasogenic edema, no midline shift or mass effect. s/p supratentorial craniotomy excision brain tumor.    PT Comments    Pt progressing towards physical therapy goals. Was able to ambulate into the bathroom today however demonstrated DOE and required transition to the chair after she was finished voiding. General tolerance for functional activity remains low and pt is appropriate for continued rehab at d/c at the SNF level.   Follow Up Recommendations  SNF     Equipment Recommendations  None recommended by PT    Recommendations for Other Services       Precautions / Restrictions Precautions Precautions: Fall Restrictions Weight Bearing Restrictions: No    Mobility  Bed Mobility Overal bed mobility: Needs Assistance Bed Mobility: Supine to Sit     Supine to sit: HOB elevated;Min assist     General bed mobility comments: Assist for trunk support as pt transitioned to EOB. Increased time for pt to scoot all the way out and place feet on floor.   Transfers Overall transfer level: Needs assistance Equipment used: Rolling walker (2 wheeled) Transfers: Sit to/from Stand Sit to Stand: Min assist         General transfer comment: assist to rise and stabilize L LE  Ambulation/Gait Ambulation/Gait assistance: Min assist Ambulation Distance (Feet): 10 Feet Assistive device: Rolling walker (2 wheeled) Gait Pattern/deviations: Step-through pattern;Decreased stride length;Trunk flexed Gait velocity: decreased Gait velocity interpretation: Below normal speed for age/gender General  Gait Details: Pt ambulated into bathroom with RW and min assist for balance support. Pt reports being SOB once in there and required cues to initiate sitting down to void (possibly due to anxiety?)   Stairs            Wheelchair Mobility    Modified Rankin (Stroke Patients Only)       Balance Overall balance assessment: Needs assistance Sitting-balance support: Feet supported Sitting balance-Leahy Scale: Fair     Standing balance support: During functional activity Standing balance-Leahy Scale: Poor                      Cognition Arousal/Alertness: Awake/alert Behavior During Therapy: WFL for tasks assessed/performed Overall Cognitive Status: Within Functional Limits for tasks assessed       Memory: Decreased short-term memory              Exercises      General Comments        Pertinent Vitals/Pain Pain Assessment: Faces Faces Pain Scale: Hurts a little bit Pain Location: head Pain Descriptors / Indicators: Aching Pain Intervention(s): Limited activity within patient's tolerance;Monitored during session;Repositioned    Home Living                      Prior Function            PT Goals (current goals can now be found in the care plan section) Acute Rehab PT Goals Patient Stated Goal: to be independent again PT Goal Formulation: With patient Time For Goal Achievement: 07/25/16 Potential to Achieve Goals: Fair Progress towards PT goals: Progressing toward  goals    Frequency    Min 3X/week      PT Plan Current plan remains appropriate    Co-evaluation             End of Session Equipment Utilized During Treatment: Gait belt Activity Tolerance: Patient limited by fatigue Patient left: in bed;with call bell/phone within reach;with bed alarm set;with nursing/sitter in room     Time: [id number] PT Time Calculation (min) (ACUTE ONLY): 31 min  Charges:  $Gait Training: 8-22 mins $Therapeutic Activity: 8-22  mins                    G Codes:      Thelma Comp Aug 01, 2016, 12:02 PM  Rolinda Roan, PT, DPT Acute Rehabilitation Services Pager: [phone]

## 2016-07-16 NOTE — Discharge Summary (Signed)
Savannah Howard [id number] DOB: [birth year] DOA: 07/09/2016  PCP: Walker Kehr, MD  Admit date: 07/09/2016  Discharge date: 07/16/2016  Admitted From: [facility]   Disposition:  snf   Recommendations for Outpatient Follow-up:   Follow up with PCP in 1-2 weeks  PCP Please obtain BMP/CBC, 2 view CXR in 1week,  (see Discharge instructions)   PCP Please follow up on the following pending results: nONE   Home Health: nONE   Equipment/Devices: nONE  Consultations: n.sURG Discharge Condition: sTABLE   CODE STATUS: DNR   Diet Recommendation:  Heart Healthy    Chief Complaint  Patient presents with  . Fall  . Weakness     Brief history of present illness from the day of admission and additional interim summary    Patient is a 70 y.o. female with hx of left Breast cancer status post neoadjuvant chemotherapy and then underwent mastectomy in October 2016, currently on anastrozole-admitted with possible syncope, further evaluation with CT head/MRI brain showed a cystic mass in the mid superior right temporal lobe. Evaluated by neurosurgery, underwent craniotomy and excision of the brain tumor on 12/4. 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 Evaluated by neurosurgery, underwent craniotomy and excision of the mass on 12/4. Await biopsy results. Postop care including dosing of steroids and Keppra Which will be continued at present dose, she must see neurosurgeon Dr. Cyndy Freeze within a week and get these medications readdressed. Currently nonfocal exam.PT eval completed-plans are for SNF Discharge today.  Rhabdomyolysis: Traumatic in a setting of fall, CK has down trended significantly  with supportive care and IV fluids.    Acute on chronic kidney disease stage III: Acute kidney injury likely hemodynamically mediated and secondary to rhabdomyolysis- Now close to her baseline after  IV fluids, repeat BMP in a week at SNF.  ? UTI: I doubt sepsis, being monitored off antibiotics. Stable off antibiotics.  Hx of left Breast cancer status post neoadjuvant chemotherapy:underwent mastectomy in October 2016, currently on anastrozole-followed by Dr Lindi Adie. We will have her follow with him post discharge.  Hypertension: Stable on home regimen which will be continued.  Fall/syncope: Probably neurocardiogenic-current Doppler unremarkable, echo showed normal systolic function. Doubt further workup is necessary.   Left knee pain: Probably secondary to fall, x-rays of the  pelvis and left knee negative. Continue supportive care. She was mobilized with physical therapy yesterday, and claims that she was able to bear some weight without pain. I suspect we could continue to monitor and in the event she more pain and is unable to bear weight-will then proceed with more imaging studies, SNF M.D. To tinue to monitor.   GERD: PPI  Anxiety: Slightly anxious given her current circumstance of postop care/craniotomy-continue as needed lorazepam.     Discharge diagnosis     Principal Problem:   Rhabdomyolysis Active Problems:   GERD   Hypertension   Breast cancer  of upper-outer quadrant of left female breast [facility])   Fall   UTI (urinary tract infection)   Syncope   Acute on chronic kidney failure-II   Sepsis ([place])   Pressure injury of skin    Discharge instructions    Discharge Instructions    Discharge instructions    Complete by:  As directed    Follow with Primary MD Walker Kehr, MD in 7 days   Get CBC, CMP, 2 view Chest X ray checked  by Primary MD or SNF MD in 5-7 days ( we routinely change or add medications that can affect your baseline labs and fluid status,  therefore we recommend that you get the mentioned basic workup next visit with your PCP, your PCP may decide not to get them or add new tests based on their clinical decision)   Activity: As tolerated with Full fall precautions use walker/cane & assistance as needed   Disposition SNF   Diet:   Diet Heart Room  For Heart failure patients - Check your Weight same time everyday, if you gain over 2 pounds, or you develop in leg swelling, experience more shortness of breath or chest pain, call your Primary MD immediately. Follow Cardiac Low Salt Diet and 1.5 lit/day fluid restriction.   On your next visit with your primary care physician please Get Medicines reviewed and adjusted.   Please request your Prim.MD to go over all Hospital Tests and Procedure/Radiological results at the follow up, please get all Hospital records sent to your Prim MD by signing hospital release before you go home.   If you experience worsening of your admission symptoms, develop shortness of breath, life threatening emergency, suicidal or homicidal thoughts you must seek medical attention immediately by calling 911 or calling your MD immediately  if symptoms less severe.  You Must read complete instructions/literature along with all the possible adverse reactions/side effects for all the Medicines you take and that have been prescribed to you. Take any new Medicines after you have completely understood and accpet all the possible adverse reactions/side effects.   Do not drive, operate heavy machinery, perform activities at heights, swimming or participation in water activities or provide baby sitting services if your were admitted for syncope or siezures until you have seen by Primary MD or a Neurologist and advised to do so again.  Do not drive when taking Pain medications.    Do not take more than prescribed Pain, Sleep and Anxiety Medications  Special Instructions: If you have smoked or chewed Tobacco  in the  last 2 yrs please stop smoking, stop any regular Alcohol  and or any Recreational drug use.  Wear Seat belts while driving.   Please note  You were cared for by a hospitalist during your hospital stay. If you have any questions about your discharge medications or the care you received while you were in the hospital after you are discharged, you can call the unit and asked to speak with the hospitalist on call if the hospitalist that took care of you is not available. Once you are discharged, your primary care physician will handle any further medical issues. Please note that NO REFILLS for any discharge medications will be authorized once you are discharged, as it is imperative that you return to your primary care physician (or establish a relationship with a primary care physician if you do not have one) for your aftercare needs so that they can reassess your need for medications and monitor your lab  values.   Increase activity slowly    Complete by:  As directed       Discharge Medications     Medication List    TAKE these medications   acetaminophen 500 MG tablet Commonly known as:  TYLENOL Take 1,000 mg by mouth every 6 (six) hours as needed for mild pain.   anastrozole 1 MG tablet Commonly known as:  ARIMIDEX Take 1 tablet (1 mg total) by mouth daily.   dexamethasone 2 MG tablet Commonly known as:  DECADRON Take 1 tablet (2 mg total) by mouth every 6 (six) hours.   docusate sodium 100 MG capsule Commonly known as:  COLACE Take 1 capsule (100 mg total) by mouth 2 (two) times daily.   levETIRAcetam 750 MG tablet Commonly known as:  KEPPRA Take 1 tablet (750 mg total) by mouth 2 (two) times daily.   LORazepam 0.5 MG tablet Commonly known as:  ATIVAN Take 1 tablet (0.5 mg total) by mouth 2 (two) times daily. What changed:  See the new instructions.   losartan-hydrochlorothiazide 100-25 MG tablet Commonly known as:  HYZAAR TAKE 1 TABLET BY MOUTH EVERY DAY   metoprolol  succinate 25 MG 24 hr tablet Commonly known as:  TOPROL XL Take 1 tablet (25 mg total) by mouth daily.   omeprazole 20 MG capsule Commonly known as:  PRILOSEC Take 1 capsule (20 mg total) by mouth daily.   oxyCODONE-acetaminophen 5-325 MG tablet Commonly known as:  PERCOCET/ROXICET Take 1 tablet by mouth every 8 (eight) hours as needed for moderate pain.   potassium chloride 10 MEQ CR capsule Commonly known as:  MICRO-K TAKE ONE CAPSULE BY MOUTH EVERY DAY   terazosin 5 MG capsule Commonly known as:  HYTRIN TAKE 1 CAPSULE (5 MG TOTAL) BY MOUTH AT BEDTIME.   vitamin B-12 1000 MCG tablet Commonly known as:  CYANOCOBALAMIN Take 1,000 mcg by mouth daily.   cyanocobalamin 1000 MCG/ML injection Commonly known as:  (VITAMIN B-12) INJECT 1 MILLILITER INTRAMUSCULARLY EVERY 30 DAYS       Follow-up Information    Walker Kehr, MD. Schedule an appointment as soon as possible for a visit in 1 week(s).   Specialty:  Internal Medicine Contact information: [place] [address] [phone]        Kevan Ny Ditty, MD. Schedule an appointment as soon as possible for a visit in 1 week(s).   Specialty:  Neurosurgery Contact information: [address] [place] [address] [phone]        Rulon Eisenmenger, MD. Schedule an appointment as soon as possible for a visit in 1 week(s).   Specialty:  Hematology and Oncology Contact information: [place] [address] [phone]           Major procedures and Radiology Reports - PLEASE review detailed and final reports thoroughly  -      12/4>>Supratentorial craniotomy for excision of brain tumor   Dg Chest 1 View  Result Date: 07/09/2016 CLINICAL DATA:  Found on floor after fall EXAM: CHEST 1 VIEW COMPARISON:  12/31/2014 FINDINGS: A right-sided chest port is in place, the superior portion of the catheter is not included on the image. The tip of the catheter projects over  expected location of SVC. Surgical clips in the left axilla and within the right upper quadrant. There is mild cardiomegaly without overt failure. Mediastinal contour is exaggerated by patient rotation. Mild atelectasis left base. No acute infiltrate. No pneumothorax. IMPRESSION:  1. Mild cardiomegaly without overt failure 2. Mild atelectasis at the left lung base. Electronically Signed   By: Donavan Foil M.D.   On: 07/09/2016 23:38   Dg Chest 2 View  Result Date: 07/13/2016 CLINICAL DATA:  Preoperative chest radiograph.  Initial encounter. EXAM: CHEST  2 VIEW COMPARISON:  Chest radiograph performed 07/09/2016, and CT of the chest performed 07/11/2016 FINDINGS: The lungs are mildly hypoexpanded, with minimal bilateral atelectasis. There is no evidence of pleural effusion or pneumothorax. The heart is enlarged. A right-sided chest port is noted ending about the mid SVC. No acute osseous abnormalities are seen. Scattered clips are noted overlying the left axilla. Clips are noted within the right upper quadrant, reflecting prior cholecystectomy. IMPRESSION: Lungs mildly hypoexpanded, with minimal bilateral atelectasis. Cardiomegaly. Electronically Signed   By: Garald Balding M.D.   On: 07/13/2016 01:37   Dg Lumbar Spine Complete  Result Date: 07/09/2016 CLINICAL DATA:  Fall extremity weakness EXAM: LUMBAR SPINE - COMPLETE 4+ VIEW COMPARISON:  None. FINDINGS: Surgical clips are present in the right upper quadrant. There are 5 non rib-bearing lumbar type vertebra. Calcified pelvic phleboliths. Lumbar alignment within normal limits. Severe narrowing at L5-S1 with endplate changes. Moderate narrowing at L1-L2. The set degenerative changes of the lower lumbar spine. Atherosclerosis of the aorta. IMPRESSION: Degenerative changes.  No acute osseous abnormality. Electronically Signed   By: Donavan Foil M.D.   On: 07/09/2016 23:42   Dg Pelvis 1-2 Views  Result Date: 07/09/2016 CLINICAL DATA:  Found down, fall  with leg weakness EXAM: PELVIS - 1-2 VIEW COMPARISON:  None. FINDINGS: SI joints are symmetric. Pubic symphysis is intact. No fracture or dislocation. IMPRESSION: No acute osseous abnormality Electronically Signed   By: Donavan Foil M.D.   On: 07/09/2016 23:45   Dg Shoulder Right  Result Date: 07/09/2016 CLINICAL DATA:  Fall EXAM: RIGHT SHOULDER - 2+ VIEW COMPARISON:  None. FINDINGS: There is no evidence of fracture or dislocation. There is no evidence of arthropathy or other focal bone abnormality. Soft tissues are unremarkable. IMPRESSION: Negative. Electronically Signed   By: Donavan Foil M.D.   On: 07/09/2016 23:45   Ct Head Wo Contrast  Result Date: 07/10/2016 CLINICAL DATA:  Status post fall with impact to posterior head. EXAM: CT HEAD WITHOUT CONTRAST CT CERVICAL SPINE WITHOUT CONTRAST TECHNIQUE: Multidetector CT imaging of the head and cervical spine was performed following the standard protocol without intravenous contrast. Multiplanar CT image reconstructions of the cervical spine were also generated. COMPARISON:  None. FINDINGS: CT HEAD FINDINGS Brain: There is a cystic mass within the anterior right temporal lobe that measures 3.9 x 3.5 cm with adjacent vasogenic edema extending into the right temporal and lower parietal white matter. There is no midline shift or other mass effect. No hydrocephalus. No intracranial hemorrhage or evidence of acute cortical infarct. The remainder the brain parenchyma is normal. No age advanced or lobar predominant atrophy. Vascular: Atherosclerotic calcification of the vertebral and internal carotid arteries at the skullbase is present. Skull: No skull fracture or focal calvarial lesion. Visualized skull base is normal. Sinuses/Orbits: No sinus fluid levels or advanced mucosal thickening. No mastoid effusion. Normal orbits. CT CERVICAL SPINE FINDINGS Alignment: No static subluxation. Facets are aligned. Occipital condyles are normally positioned. Skull base and  vertebrae: No acute fracture. Soft tissues and spinal canal: No prevertebral fluid or swelling. No visible canal hematoma. Disc levels: No advanced spinal canal or neural foraminal stenosis. Upper chest: Left subarticular predominant C5-C6 disc osteophyte complex  narrows the left lateral recess and left neural foramen. Other: Normal visualized paraspinal cervical soft tissues. IMPRESSION: 1. Predominantly cystic mass within the anterior right temporal lobe with mild surrounding vasogenic edema. Given the patient's history of breast cancer, this is favored to indicate metastatic disease. A primary CNS neoplasm, such as a glioma, is also a consideration. MRI is recommended for further characterization. 2. No midline shift or mass effect. 3. No acute fracture or static subluxation of the cervical spine. Electronically Signed   By: Ulyses Jarred M.D.   On: 07/10/2016 02:23   Ct Chest W Contrast  Result Date: 07/11/2016 CLINICAL DATA:  Status post fall, with generalized chest and abdominal pain. Initial encounter. EXAM: CT CHEST, ABDOMEN, AND PELVIS WITH CONTRAST TECHNIQUE: Multidetector CT imaging of the chest, abdomen and pelvis was performed following the standard protocol during bolus administration of intravenous contrast. CONTRAST:  14mL ISOVUE-300 IOPAMIDOL (ISOVUE-300) INJECTION 61% COMPARISON:  Lumbar spine radiographs performed 07/09/2016 FINDINGS: CT CHEST FINDINGS Cardiovascular: Diffuse coronary artery calcifications are seen. Scattered calcification is seen along the aortic arch and descending thoracic aorta. The heart remains normal in size. The great vessels are grossly unremarkable. Mediastinum/Nodes: The mediastinum is otherwise unremarkable in appearance. No mediastinal lymphadenopathy is seen. No pericardial effusion is identified. The thyroid gland is unremarkable. No axillary lymphadenopathy is seen. A 3.7 cm cyst at the left axilla may reflect a postoperative seroma, given adjacent clips.  Lungs/Pleura: Minimal bibasilar atelectasis is noted. The lungs are otherwise clear. No pleural effusion or pneumothorax is seen. No masses are identified. Musculoskeletal: No acute osseous abnormalities are identified. The visualized musculature is unremarkable in appearance. CT ABDOMEN PELVIS FINDINGS Hepatobiliary: The liver is unremarkable in appearance. The patient is status post cholecystectomy, with clips noted at the gallbladder fossa. The common bile duct remains normal in caliber. Pancreas: The pancreas is within normal limits. Spleen: The spleen is unremarkable in appearance. Adrenals/Urinary Tract: The adrenal glands are unremarkable in appearance. Mild bilateral renal atrophy is noted, with nonspecific bilateral perinephric stranding. There is no evidence of hydronephrosis. No renal or ureteral stones are identified. Stomach/Bowel: The stomach is unremarkable in appearance. The small bowel is within normal limits. The appendix is normal in caliber, without evidence of appendicitis. Scattered diverticulosis is noted along the descending and sigmoid colon, without evidence of diverticulitis. Vascular/Lymphatic: Scattered calcification is seen along the abdominal aorta and its branches. The abdominal aorta is otherwise grossly unremarkable. The inferior vena cava is grossly unremarkable. No retroperitoneal lymphadenopathy is seen. No pelvic sidewall lymphadenopathy is identified. Reproductive: The bladder is mildly distended and within normal limits. The uterus is grossly unremarkable in appearance. The ovaries are relatively symmetric. No suspicious adnexal masses are seen. A 2.4 cm right adnexal cyst is likely physiologic in nature. Other: No additional soft tissue abnormalities are seen. Musculoskeletal: No acute osseous abnormalities are identified. There is swelling at the quadriceps musculature of the proximal left thigh. Would correlate for associated bruising or any evidence of rhabdomyolysis.  IMPRESSION: 1. Swelling at the quadriceps musculature of the proximal left thigh. Would correlate for any associated bruising or evidence of rhabdomyolysis. 2. Diffuse coronary artery calcifications seen. 3. 3.7 cm cyst at the left axilla may reflect a postoperative seroma, given adjacent clips. 4. Mild bilateral renal atrophy noted. 5. Scattered diverticulosis along the descending and sigmoid colon, without evidence of diverticulitis. 6. Scattered aortic atherosclerosis. Electronically Signed   By: Garald Balding M.D.   On: 07/11/2016 04:20   Ct Cervical Spine Wo Contrast  Result Date: 07/10/2016 CLINICAL DATA:  Status post fall with impact to posterior head. EXAM: CT HEAD WITHOUT CONTRAST CT CERVICAL SPINE WITHOUT CONTRAST TECHNIQUE: Multidetector CT imaging of the head and cervical spine was performed following the standard protocol without intravenous contrast. Multiplanar CT image reconstructions of the cervical spine were also generated. COMPARISON:  None. FINDINGS: CT HEAD FINDINGS Brain: There is a cystic mass within the anterior right temporal lobe that measures 3.9 x 3.5 cm with adjacent vasogenic edema extending into the right temporal and lower parietal white matter. There is no midline shift or other mass effect. No hydrocephalus. No intracranial hemorrhage or evidence of acute cortical infarct. The remainder the brain parenchyma is normal. No age advanced or lobar predominant atrophy. Vascular: Atherosclerotic calcification of the vertebral and internal carotid arteries at the skullbase is present. Skull: No skull fracture or focal calvarial lesion. Visualized skull base is normal. Sinuses/Orbits: No sinus fluid levels or advanced mucosal thickening. No mastoid effusion. Normal orbits. CT CERVICAL SPINE FINDINGS Alignment: No static subluxation. Facets are aligned. Occipital condyles are normally positioned. Skull base and vertebrae: No acute fracture. Soft tissues and spinal canal: No prevertebral  fluid or swelling. No visible canal hematoma. Disc levels: No advanced spinal canal or neural foraminal stenosis. Upper chest: Left subarticular predominant C5-C6 disc osteophyte complex narrows the left lateral recess and left neural foramen. Other: Normal visualized paraspinal cervical soft tissues. IMPRESSION: 1. Predominantly cystic mass within the anterior right temporal lobe with mild surrounding vasogenic edema. Given the patient's history of breast cancer, this is favored to indicate metastatic disease. A primary CNS neoplasm, such as a glioma, is also a consideration. MRI is recommended for further characterization. 2. No midline shift or mass effect. 3. No acute fracture or static subluxation of the cervical spine. Electronically Signed   By: Ulyses Jarred M.D.   On: 07/10/2016 02:23   Mr Jeri Cos [id number] Contrast  Result Date: 07/14/2016 CLINICAL DATA:  Resection of cystic neoplasm yesterday. EXAM: MRI HEAD WITHOUT AND WITH CONTRAST TECHNIQUE: Multiplanar, multiecho pulse sequences of the brain and surrounding structures were obtained without and with intravenous contrast. CONTRAST:  65mL MULTIHANCE GADOBENATE DIMEGLUMINE 529 MG/ML IV SOLN COMPARISON:  MRI of the brain 07/10/16. FINDINGS: Brain: A right temporal craniotomy is noted. The peripherally enhancing cystic lesion has been resected. Blood products are evident within the surgical cavity. There is residual enhancement along the anterior and superior margin of the surgical cavity. The longest contiguous area of enhancement is 3.4 cm. There is also some restricted diffusion along the posterior margin of the surgical cavity. Posterior T2 changes are again noted. A lesion in the high left parietal lobe measures 13 x 10 x 10 mm. No other focal enhancing lesions are present. White matter changes are otherwise stable. Vascular: Flow is present in the major intracranial arteries. Skull and upper cervical spine: The skullbase is within normal limits. Calvarium  is otherwise intact. Marrow signal is normal. The craniocervical junction is within normal limits. Sinuses/Orbits: Mild mucosal thickening is present within the anterior ethmoid air cells in the inferior right frontal sinus. Mastoid air cells are clear. The globes and orbits are intact IMPRESSION: 1. Right temporal craniotomy for resection of temporal lobe tumor, likely metastasis. 2. Residual enhancement along the anterior and superior margin of the resection cavity likely reflects some residual tumor. 3. Restricted diffusion along the posterior margin of the surgical cavity likely reflects infarct. There is no associated enhancement. 4. 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 5. Diffuse white matter disease is stable. These results were called by telephone at the time of interpretation on 07/14/2016 at 2:00 pm to Dr. Cyndy Freeze , who verbally acknowledged these results. Electronically Signed   By: San Morelle M.D.   On: 07/14/2016 14:04   Mr Jeri Cos [id number] Contrast  Result Date: 07/10/2016 CLINICAL DATA:  70 y/o  F; brain tumor. EXAM: MRI HEAD WITHOUT AND WITH CONTRAST TECHNIQUE: Multiplanar, multiecho pulse sequences of the brain and surrounding structures were obtained without and with intravenous contrast. Brain lab protocol CONTRAST:  23mL MULTIHANCE GADOBENATE DIMEGLUMINE 529 MG/ML IV SOLN COMPARISON:  07/10/2016 CT head. FINDINGS: Mass centered in the right mid superior temporal lobe with expansion of the superior temporal gyrus measuring 49 x 39 x 31 mm (AP x ML x CC, series 11: Image 8 and 12:33). The mass demonstrates a thin peripheral rim of enhancement with diffusion restriction, a central T2 hyperintense and T1 hypointense fluid collection, thin septations, and small surrounding area of nonenhancing T2 FLAIR hyperintense signal abnormality in white matter consistent with vasogenic edema. Local mass effect effaces the temporal horn of right lateral ventricle and results in minimal  right-sided uncal herniation and ambient cistern effacement. No significant midline shift. Diffusion tensor imaging demonstrates mild medial displacement of the inferior longitudinal fasciculus which abuts the inferomedial margin of the mass (653:23). Additionally, the optic radiations are medially displaced and abut the posterior superiomedial aspect of the mass (653:26). Decreased anisotropy within the surrounding white matter tracts, optic radiations, and inferior longitudinal fasciculus is likely due to vasogenic edema. If the mass is a primary CNS tumor, then nonenhancing tumor infiltration is also possible. IMPRESSION: Cystic mass centered in the mid superior right temporal lobe with thin peripheral enhancement. 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 There is medial displacement of the right inferior longitudinal fasciculus and right optic radiations which abut the medial aspect of tumor as described. Electronically Signed   By: Kristine Garbe M.D.   On: 07/10/2016 20:16   Ct Abdomen Pelvis W Contrast  Result Date: 07/11/2016 CLINICAL DATA:  Status post fall, with generalized chest and abdominal pain. Initial encounter. EXAM: CT CHEST, ABDOMEN, AND PELVIS WITH CONTRAST TECHNIQUE: Multidetector CT imaging of the chest, abdomen and pelvis was performed following the standard protocol during bolus administration of intravenous contrast. CONTRAST:  177mL ISOVUE-300 IOPAMIDOL (ISOVUE-300) INJECTION 61% COMPARISON:  Lumbar spine radiographs performed 07/09/2016 FINDINGS: CT CHEST FINDINGS Cardiovascular: Diffuse coronary artery calcifications are seen. Scattered calcification is seen along the aortic arch and descending thoracic aorta. The heart remains normal in size. The great vessels are grossly unremarkable. Mediastinum/Nodes: The mediastinum is otherwise unremarkable in appearance. No mediastinal lymphadenopathy is seen. No pericardial effusion is identified.  The thyroid gland is unremarkable. No axillary lymphadenopathy is seen. A 3.7 cm cyst at the left axilla may reflect a postoperative seroma, given adjacent clips. Lungs/Pleura: Minimal bibasilar atelectasis is noted. The lungs are otherwise clear. No pleural effusion or pneumothorax is seen. No masses are identified. Musculoskeletal: No acute osseous abnormalities are identified. The visualized musculature is unremarkable in appearance. CT ABDOMEN PELVIS FINDINGS Hepatobiliary: The liver is unremarkable in appearance. The patient is status post cholecystectomy, with clips noted at the gallbladder fossa. The common bile duct remains normal in caliber. Pancreas: The pancreas is within normal limits. Spleen: The spleen is unremarkable in appearance. Adrenals/Urinary Tract: The adrenal glands are unremarkable in appearance. Mild bilateral renal atrophy is noted, with nonspecific bilateral perinephric  stranding. There is no evidence of hydronephrosis. No renal or ureteral stones are identified. Stomach/Bowel: The stomach is unremarkable in appearance. The small bowel is within normal limits. The appendix is normal in caliber, without evidence of appendicitis. Scattered diverticulosis is noted along the descending and sigmoid colon, without evidence of diverticulitis. Vascular/Lymphatic: Scattered calcification is seen along the abdominal aorta and its branches. The abdominal aorta is otherwise grossly unremarkable. The inferior vena cava is grossly unremarkable. No retroperitoneal lymphadenopathy is seen. No pelvic sidewall lymphadenopathy is identified. Reproductive: The bladder is mildly distended and within normal limits. The uterus is grossly unremarkable in appearance. The ovaries are relatively symmetric. No suspicious adnexal masses are seen. A 2.4 cm right adnexal cyst is likely physiologic in nature. Other: No additional soft tissue abnormalities are seen. Musculoskeletal: No acute osseous abnormalities are  identified. There is swelling at the quadriceps musculature of the proximal left thigh. Would correlate for associated bruising or any evidence of rhabdomyolysis. IMPRESSION: 1. Swelling at the quadriceps musculature of the proximal left thigh. Would correlate for any associated bruising or evidence of rhabdomyolysis. 2. Diffuse coronary artery calcifications seen. 3. 3.7 cm cyst at the left axilla may reflect a postoperative seroma, given adjacent clips. 4. Mild bilateral renal atrophy noted. 5. Scattered diverticulosis along the descending and sigmoid colon, without evidence of diverticulitis. 6. Scattered aortic atherosclerosis. Electronically Signed   By: Garald Balding M.D.   On: 07/11/2016 04:20   Dg Knee Complete 4 Views Left  Result Date: 07/09/2016 CLINICAL DATA:  Found on floor, fall weakness EXAM: LEFT KNEE - COMPLETE 4+ VIEW COMPARISON:  None. FINDINGS: No acute displaced fracture or dislocation. Mild narrowing of the medial and lateral joint space compartments with bony spurring. Superior and inferior patellar osteophytes. Vascular calcifications. IMPRESSION: No acute osseous abnormality Electronically Signed   By: Donavan Foil M.D.   On: 07/09/2016 23:40    Micro Results     Recent Results (from the past 240 hour(s))  Urine culture     Status: Abnormal   Collection Time: 07/09/16 10:55 PM  Result Value Ref Range Status   Specimen Description URINE, CLEAN CATCH  Final   Special Requests NONE  Final   Culture MULTIPLE SPECIES PRESENT, SUGGEST RECOLLECTION (A)  Final   Report Status 07/11/2016 FINAL  Final  Surgical PCR screen     Status: None   Collection Time: 07/12/16  8:18 PM  Result Value Ref Range Status   MRSA, PCR NEGATIVE NEGATIVE Final   Staphylococcus aureus NEGATIVE NEGATIVE Final    Comment:        The Xpert SA Assay (FDA approved for NASAL specimens in patients over 58 years of age), is one component of a comprehensive surveillance program.  Test performance  has been validated by [facility] for patients greater than or equal to 28 year old. It is not intended to diagnose infection nor to guide or monitor treatment.     Today   Subjective    Savannah Howard today has no headache,no chest abdominal pain,no new weakness tingling or numbness, feels much better wants to go home today.     Objective   Blood pressure (!) 148/60, pulse 66, temperature 98.5 F (36.9 C), temperature source Oral, resp. rate 17, height 5\' 3"  (1.6 m), weight 91.6 kg (201 lb 15.1 oz), SpO2 98 %.  No intake or output data in the 24 hours ending 07/16/16 1133  Exam Awake Alert, Oriented x 3, No new F.N deficits, Normal affect,  R SCALP INCISION SITE CLEAN [place].AT,PERRAL Supple Neck,No JVD, No cervical lymphadenopathy appriciated.  Symmetrical Chest wall movement, Good air movement bilaterally, CTAB RRR,No Gallops,Rubs or new Murmurs, No Parasternal Heave +ve B.Sounds, Abd Soft, Non tender, No organomegaly appriciated, No rebound -guarding or rigidity. No Cyanosis, Clubbing or edema, No new Rash or bruise   Data Review   CBC w Diff:  Lab Results  Component Value Date   WBC 11.4 (H) 07/14/2016   HGB 9.5 (L) 07/14/2016   HGB 12.6 10/07/2015   HCT 28.8 (L) 07/14/2016   HCT 37.9 10/07/2015   PLT 208 07/14/2016   PLT 216 10/07/2015   LYMPHOPCT 9 07/09/2016   LYMPHOPCT 48.6 10/07/2015   MONOPCT 8 07/09/2016   MONOPCT 7.6 10/07/2015   EOSPCT 0 07/09/2016   EOSPCT 3.0 10/07/2015   BASOPCT 0 07/09/2016   BASOPCT 0.5 10/07/2015    CMP:  Lab Results  Component Value Date   NA 136 07/14/2016   NA 141 10/07/2015   K 3.7 07/14/2016   K 3.4 (L) 10/07/2015   CL 102 07/14/2016   CO2 24 07/14/2016   CO2 22 10/07/2015   BUN 27 (H) 07/14/2016   BUN 24.0 10/07/2015   CREATININE 0.99 07/14/2016   CREATININE 1.2 (H) 10/07/2015   PROT 7.6 07/09/2016   PROT 7.6 10/07/2015   ALBUMIN 4.1 07/09/2016   ALBUMIN 3.8 10/07/2015   BILITOT 0.9 07/09/2016   BILITOT 0.35  10/07/2015   ALKPHOS 68 07/09/2016   ALKPHOS 76 10/07/2015   AST 71 (H) 07/09/2016   AST 13 10/07/2015   ALT 22 07/09/2016   ALT 13 10/07/2015  .   Total Time in preparing paper work, data evaluation and todays exam - 35 minutes  Thurnell Lose M.D on 07/16/2016 at 11:33 AM  [facility]   Office  [phone]

## 2016-07-16 NOTE — Progress Notes (Signed)
Occupational Therapy Treatment Patient Details Name: Savannah Howard MRN: [id number] DOB: July 22, 1946 Today's Date: 07/16/2016    History of present illness Pt is a 70 y/o female admitted secondary to sustaining a fall at home. PMH including but not limited to breast cancer (s/p mastectomy and chemo - 2016) and CKD.  CT head showed cystic mass within the anterior right temporal lobe with mild surrounding vasogenic edema, no midline shift or mass effect. s/p supratentorial craniotomy excision brain tumor.   OT comments  Pt assisted to [facility] and performed seated grooming. Pt eager to go to rehab and work towards returning home.  Follow Up Recommendations  SNF;Supervision/Assistance - 24 hour    Equipment Recommendations  None recommended by OT    Recommendations for Other Services      Precautions / Restrictions Precautions Precautions: Fall Restrictions Weight Bearing Restrictions: No       Mobility Bed Mobility      General bed mobility comments: pt in chair  Transfers Overall transfer level: Needs assistance Equipment used: Rolling walker (2 wheeled) Transfers: Sit to/from [facility] Sit to Stand: Min assist Stand pivot transfers: Min assist       General transfer comment: assist to rise and stabilize L LE    Balance Overall balance assessment: Needs assistance Sitting-balance support: Feet supported Sitting balance-Leahy Scale: Fair     Standing balance support: Bilateral upper extremity supported Standing balance-Leahy Scale: Poor Standing balance comment: L LE continues to be weak, buckles                   ADL Overall ADL's : Needs assistance/impaired     Grooming: Wash/dry hands;Sitting;Set up           Upper Body Dressing : Supervision/safety;Sitting Upper Body Dressing Details (indicate cue type and reason): to doff front opening gown     Toilet Transfer: Minimal assistance;Stand-pivot;RW;BSC   Toileting- Clothing  Manipulation and Hygiene: Sitting/lateral lean;Supervision/safety                Vision                     Perception     Praxis      Cognition   Behavior During Therapy: WFL for tasks assessed/performed Overall Cognitive Status: Within Functional Limits for tasks assessed       Memory: Decreased short-term memory               Extremity/Trunk Assessment               Exercises     Shoulder Instructions       General Comments      Pertinent Vitals/ Pain       Pain Assessment: Faces Faces Pain Scale: Hurts a little bit Pain Location: head Pain Descriptors / Indicators: Aching Pain Intervention(s): Monitored during session  Home Living                                          Prior Functioning/Environment              Frequency  Min 2X/week        Progress Toward Goals  OT Goals(current goals can now be found in the care plan section)  Progress towards OT goals: Progressing toward goals  Acute Rehab OT Goals Patient Stated Goal: to be independent again Time  For Goal Achievement: 07/26/16 Potential to Achieve Goals: Good  Plan Discharge plan remains appropriate    Co-evaluation                 End of Session Equipment Utilized During Treatment: Gait belt;Rolling walker   Activity Tolerance Patient tolerated treatment well   Patient Left in chair;with call bell/phone within reach;with chair alarm set   Nurse Communication          Time: 1210-1229 OT Time Calculation (min): 19 min  Charges: OT General Charges $OT Visit: 1 Procedure OT Treatments $Self Care/Home Management : 8-22 mins  Malka So 07/16/2016, 3:04 PM  [phone]

## 2016-07-16 NOTE — Discharge Instructions (Signed)
Follow with Primary MD Walker Kehr, MD in 7 days   Get CBC, CMP, 2 view Chest X ray checked  by Primary MD or SNF MD in 5-7 days ( we routinely change or add medications that can affect your baseline labs and fluid status, therefore we recommend that you get the mentioned basic workup next visit with your PCP, your PCP may decide not to get them or add new tests based on their clinical decision)   Activity: As tolerated with Full fall precautions use walker/cane & assistance as needed   Disposition SNF   Diet:   Diet Heart Room  For Heart failure patients - Check your Weight same time everyday, if you gain over 2 pounds, or you develop in leg swelling, experience more shortness of breath or chest pain, call your Primary MD immediately. Follow Cardiac Low Salt Diet and 1.5 lit/day fluid restriction.   On your next visit with your primary care physician please Get Medicines reviewed and adjusted.   Please request your Prim.MD to go over all Hospital Tests and Procedure/Radiological results at the follow up, please get all Hospital records sent to your Prim MD by signing hospital release before you go home.   If you experience worsening of your admission symptoms, develop shortness of breath, life threatening emergency, suicidal or homicidal thoughts you must seek medical attention immediately by calling 911 or calling your MD immediately  if symptoms less severe.  You Must read complete instructions/literature along with all the possible adverse reactions/side effects for all the Medicines you take and that have been prescribed to you. Take any new Medicines after you have completely understood and accpet all the possible adverse reactions/side effects.   Do not drive, operate heavy machinery, perform activities at heights, swimming or participation in water activities or provide baby sitting services if your were admitted for syncope or siezures until you have seen by Primary MD or a  Neurologist and advised to do so again.  Do not drive when taking Pain medications.    Do not take more than prescribed Pain, Sleep and Anxiety Medications  Special Instructions: If you have smoked or chewed Tobacco  in the last 2 yrs please stop smoking, stop any regular Alcohol  and or any Recreational drug use.  Wear Seat belts while driving.   Please note  You were cared for by a hospitalist during your hospital stay. If you have any questions about your discharge medications or the care you received while you were in the hospital after you are discharged, you can call the unit and asked to speak with the hospitalist on call if the hospitalist that took care of you is not available. Once you are discharged, your primary care physician will handle any further medical issues. Please note that NO REFILLS for any discharge medications will be authorized once you are discharged, as it is imperative that you return to your primary care physician (or establish a relationship with a primary care physician if you do not have one) for your aftercare needs so that they can reassess your need for medications and monitor your lab values.

## 2016-07-16 NOTE — Progress Notes (Signed)
Pt discharged from hospital per orders from MD and transferred to [facility]. RN called and gave report to [facility]. Pt aware and educated about transfer. Dressing on scalp was removed per orders from MD and Port-a-Cath was de accessed. Pt was transferred via stretcher.

## 2016-07-16 NOTE — Clinical Social Work Placement (Signed)
   CLINICAL SOCIAL WORK PLACEMENT  NOTE  Date:  07/16/2016  Patient Details  Name: Savannah Howard MRN: [id number] Date of Birth: 1946/01/22  Clinical Social Work is seeking post-discharge placement for this patient at the [place] level of care (*CSW will initial, date and re-position this form in  chart as items are completed):  Yes   Patient/family provided with [place] Work Department's list of facilities offering this level of care within the geographic area requested by the patient (or if unable, by the patient's family).  Yes   Patient/family informed of their freedom to choose among providers that offer the needed level of care, that participate in Medicare, Medicaid or managed care program needed by the patient, have an available bed and are willing to accept the patient.  Yes   Patient/family informed of [facility]'s ownership interest in [facility] and [facility], as well as of the fact that they are under no obligation to receive care at these facilities.  PASRR submitted to EDS on 07/15/16     PASRR number received on 07/15/16     Existing PASRR number confirmed on       FL2 transmitted to all facilities in geographic area requested by pt/family on 07/16/16     FL2 transmitted to all facilities within larger geographic area on       Patient informed that his/her managed care company has contracts with or will negotiate with certain facilities, including the following:            Patient/family informed of bed offers received.  Patient chooses bed at       Physician recommends and patient chooses bed at      Patient to be transferred to   on  .  Patient to be transferred to facility by       Patient family notified on   of transfer.  Name of family member notified:        PHYSICIAN       Additional Comment:    _______________________________________________ Darden Dates, LCSW 07/16/2016, 11:11 AM

## 2016-07-16 NOTE — Clinical Social Work Note (Signed)
Clinical Social Work Assessment  Patient Details  Name: Savannah Howard MRN: [id number] Date of Birth: 1946/01/23  Date of referral:  07/16/16               Reason for consult:  Facility Placement, Discharge Planning                Permission sought to share information with:  Family Supports Permission granted to share information::  Yes, Verbal Permission Granted  Name::     Karis Juba  Relationship::  care giver  Contact Information:  [phone]  Housing/Transportation Living arrangements for the past 2 months:  Hotel/Motel Source of Information:  Patient Patient Interpreter Needed:  None Criminal Activity/Legal Involvement Pertinent to Current Situation/Hospitalization:  No - Comment as needed Significant Relationships:  Friend Lives with:  Self Do you feel safe going back to the place where you live?  No Need for family participation in patient care:  Yes (Comment) (Caregiver only)  Care giving concerns:  No care giving concerns identified.   Social Worker assessment / plan:  CSW met with pt to address consult for New SNF. CSW introduced herself and explained role of social work. CSW also explained the process of discharging to SNF. Pt shared that she does not have contact with her family and has a care giver who has POA. Pt would like CSW to coordinate with [name], her POA and care giver. CSW initiated a SNF search and will follow up with bed offers. CSW will continue to follow.   Employment status:  Retired Forensic scientist:  Medicare PT Recommendations:  Inpatient [place] / Referral to community resources:  [place]  Patient/Family's Response to care:  Pt was appreciative of CSW support.   Patient/Family's Understanding of and Emotional Response to Diagnosis, Current Treatment, and Prognosis:  Pt was very tearful during assessment due to being on the floor for so long prior to admission. Pt shared that she is happy to be alive  and is looking forward to rehab.   Emotional Assessment Appearance:  Appears stated age Attitude/Demeanor/Rapport:   (Appropriate) Affect (typically observed):  Accepting, Adaptable, Pleasant, Tearful/Crying Orientation:  Oriented to Self, Oriented to Place, Oriented to  Time, Oriented to Situation Alcohol / Substance use:  Not Applicable Psych involvement (Current and /or in the community):  No (Comment)  Discharge Needs  Concerns to be addressed:  Adjustment to Illness Readmission within the last 30 days:  No Current discharge risk:  Chronically ill Barriers to Discharge:  Continued Medical Work up   [employer], LCSW 07/16/2016, 11:14 AM

## 2016-07-16 NOTE — Clinical Social Work Note (Signed)
Pt is ready for discharge today to [facility]. Pt and care giver are aware and agreeable to discharge plan. Facility is ready to admit pt as the received discharge information. RN will call report. [facility] will provide transportation. CSW is signing off as no further needs identified.   Darden Dates, MSW, LCSW  Clinical Social Worker  [phone]

## 2016-07-17 ENCOUNTER — Non-Acute Institutional Stay (SKILLED_NURSING_FACILITY): Payer: Medicare Other | Admitting: Internal Medicine

## 2016-07-17 ENCOUNTER — Telehealth: Payer: Self-pay | Admitting: *Deleted

## 2016-07-17 ENCOUNTER — Encounter: Payer: Self-pay | Admitting: Internal Medicine

## 2016-07-17 DIAGNOSIS — N183 Chronic kidney disease, stage 3 unspecified: Secondary | ICD-10-CM

## 2016-07-17 DIAGNOSIS — C7931 Secondary malignant neoplasm of brain: Secondary | ICD-10-CM

## 2016-07-17 DIAGNOSIS — F419 Anxiety disorder, unspecified: Secondary | ICD-10-CM | POA: Diagnosis not present

## 2016-07-17 DIAGNOSIS — N170 Acute kidney failure with tubular necrosis: Secondary | ICD-10-CM

## 2016-07-17 DIAGNOSIS — I10 Essential (primary) hypertension: Secondary | ICD-10-CM | POA: Diagnosis not present

## 2016-07-17 DIAGNOSIS — C712 Malignant neoplasm of temporal lobe: Secondary | ICD-10-CM

## 2016-07-17 DIAGNOSIS — T796XXD Traumatic ischemia of muscle, subsequent encounter: Secondary | ICD-10-CM

## 2016-07-17 DIAGNOSIS — K219 Gastro-esophageal reflux disease without esophagitis: Secondary | ICD-10-CM | POA: Diagnosis not present

## 2016-07-17 DIAGNOSIS — R55 Syncope and collapse: Secondary | ICD-10-CM | POA: Diagnosis not present

## 2016-07-17 DIAGNOSIS — M25562 Pain in left knee: Secondary | ICD-10-CM | POA: Diagnosis not present

## 2016-07-17 DIAGNOSIS — C50911 Malignant neoplasm of unspecified site of right female breast: Secondary | ICD-10-CM | POA: Diagnosis not present

## 2016-07-17 NOTE — Telephone Encounter (Signed)
Pt was on TCM list admitted for fall with weakness. Pt was D/C 12/7, and sent to SNF...Savannah Howard

## 2016-07-17 NOTE — Progress Notes (Signed)
: Provider:  Noah Delaine. Sheppard Coil, MD Location:  [place] Room Number: [phone] Place of Service:  SNF ([phone])  PCP: Walker Kehr, MD Patient Care Team: [name], MD as PCP - General  Extended Emergency Contact Information Primary Emergency Contact: Moore,Jeannie Address: [place], [place] Mobile Phone: [phone] Relation: Friend     Allergies: Penicillins and Sulfonamide derivatives  Chief Complaint  Patient presents with  . New Admit To SNF    Admit to Facility    HPI: Patient is 70 y.o. female with hx of left Breast cancer status post neoadjuvant chemotherapy and  mastectomy in October 2016, currently on anastrozole-admitted with possible syncope,in which she was down for a while and suffered from rhabdomyolysis.  Further evaluation with CT head/MRI brain showed a cystic mass in the mid superior right temporal lobe. Pt was admitted to [facility] from 11/30-12/7 where she was evaluated by neurosurgery, underwent craniotomy and excision of the brain tumor on 12/4. Post -op pt was placed on decadron and keppra. Hospital course was complicated by acute on CKD 2/2 to the rhabdomyolysis.Pt is admiited to SNF with generalized weakness for OT/PT and for supportive care. While at SNF pt will be followed for HTN, tx with hyzaar, hytrin  and metoprolol, GERD, tx with omeprazole and anxiety tx with lorazepam.  Past Medical History:  Diagnosis Date  . Allergy    rhinitis  . Anemia   . Anxiety   . DDD (degenerative disc disease), lumbar dx'd in 1980's  . GERD (gastroesophageal reflux disease)   . Heart murmur   . History of blood transfusion 2016 X 3 (05/30/2015)  . Hypertension   . Kidney stone 1990's X 1   "passed it"  . Obesity   . 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 Vitamin B 12 deficiency   . Vitamin D deficiency     Past Surgical History:  Procedure Laterality  Date  . APPLICATION OF CRANIAL NAVIGATION N/A 07/13/2016   Procedure: APPLICATION OF CRANIAL NAVIGATION;  Surgeon: Kevan Ny Ditty, MD;  Location: [place];  Service: Neurosurgery;  Laterality: N/A;  . BREAST BIOPSY Left ~ 12/2014  . CHOLECYSTECTOMY OPEN  ~ 1975  . ESOPHAGEAL DILATION  X 3  . MASTECTOMY COMPLETE / SIMPLE W/ SENTINEL NODE BIOPSY Left 05/30/2015  . MASTECTOMY W/ SENTINEL NODE BIOPSY Left 05/30/2015   Procedure: LEFT MASTECTOMY WITH SENTINEL LYMPH NODE BIOPSY;  Surgeon: Autumn Messing III, MD;  Location: [place];  Service: General;  Laterality: Left;  . PORTACATH PLACEMENT Right 12/26/2014   Procedure: INSERTION PORT-A-CATH;  Surgeon: Autumn Messing III, MD;  Location: [place];  Service: General;  Laterality: Right;  . PR DURAL GRAFT REPAIR,SPINE DEFECT N/A 07/13/2016   Procedure: Marshia Ly STERIOTACTIC BIOPSY WITH STEALTH;  Surgeon: Kevan Ny Ditty, MD;  Location: [place];  Service: Neurosurgery;  Laterality: N/A;      Medication List       Accurate as of 07/17/16  2:50 PM. Always use your most recent med list.          acetaminophen 500 MG tablet Commonly known as:  TYLENOL Take 1,000 mg by mouth every 6 (six) hours as needed for mild pain.   anastrozole 1 MG tablet Commonly known as:  ARIMIDEX Take 1 tablet (1 mg total) by mouth daily.   dexamethasone 2  MG tablet Commonly known as:  DECADRON Take 1 tablet (2 mg total) by mouth every 6 (six) hours.   docusate sodium 100 MG capsule Commonly known as:  COLACE Take 1 capsule (100 mg total) by mouth 2 (two) times daily.   levETIRAcetam 750 MG tablet Commonly known as:  KEPPRA Take 1 tablet (750 mg total) by mouth 2 (two) times daily.   LORazepam 0.5 MG tablet Commonly known as:  ATIVAN Take 1 tablet (0.5 mg total) by mouth 2 (two) times daily.   losartan-hydrochlorothiazide 100-25 MG tablet Commonly known as:  HYZAAR TAKE 1 TABLET BY MOUTH EVERY DAY   metoprolol succinate 25 MG 24 hr  tablet Commonly known as:  TOPROL XL Take 1 tablet (25 mg total) by mouth daily.   omeprazole 20 MG capsule Commonly known as:  PRILOSEC Take 1 capsule (20 mg total) by mouth daily.   oxyCODONE-acetaminophen 5-325 MG tablet Commonly known as:  PERCOCET/ROXICET Take 1 tablet by mouth every 8 (eight) hours as needed for moderate pain.   potassium chloride 10 MEQ CR capsule Commonly known as:  MICRO-K TAKE ONE CAPSULE BY MOUTH EVERY DAY   terazosin 5 MG capsule Commonly known as:  HYTRIN TAKE 1 CAPSULE (5 MG TOTAL) BY MOUTH AT BEDTIME.   vitamin B-12 1000 MCG tablet Commonly known as:  CYANOCOBALAMIN Take 1,000 mcg by mouth daily.   cyanocobalamin 1000 MCG/ML injection Commonly known as:  (VITAMIN B-12) INJECT 1 MILLILITER INTRAMUSCULARLY EVERY 30 DAYS       No orders of the defined types were placed in this encounter.   Immunization History  Administered Date(s) Administered  . Tdap 11/05/2014    Social History  Substance Use Topics  . Smoking status: Never Smoker  . Smokeless tobacco: Never Used  . Alcohol use Yes     Comment: 05/30/2015 "I might take a drink of wine 1-2 times/yr"    Family history is   Family History  Problem Relation Age of Onset  . Heart disease Mother     CAD  . COPD Mother   . Heart disease Father 36    leaky valve  . Parkinsonism Father   . Hypertension Other       Review of Systems  DATA OBTAINED: from patient GENERAL:  no fevers, fatigue, appetite changes; wants tylenol only for pain with occasional other, something not very stron SKIN: No itching, or rash EYES: No eye pain, redness, discharge EARS: No earache, tinnitus, change in hearing NOSE: No congestion, drainage or bleeding  MOUTH/THROAT: No mouth or tooth pain, No sore throat RESPIRATORY: No cough, wheezing, SOB CARDIAC: No chest pain, palpitations, lower extremity edema  GI: No abdominal pain, No N/V/D or constipation, No heartburn or reflux  GU: No dysuria,  frequency or urgency, or incontinence  MUSCULOSKELETAL: No unrelieved bone/joint pain NEUROLOGIC: No headache, dizziness or focal weakness PSYCHIATRIC: wants to keep a positive outlook  Vitals:   07/17/16 1245  BP: (!) 148/74  Pulse: 75  Resp: 20  Temp: 97.1 F (36.2 C)    SpO2 Readings from Last 1 Encounters:  07/16/16 98%   Body mass index is 35.77 kg/m.     Physical Exam  GENERAL APPEARANCE: Alert, conversant,  No acute distress.  SKIN: No diaphoresis rash; incision line with staples looks good HEAD: Normocephalic, atraumatic  EYES: Conjunctiva/lids clear. Pupils round, reactive. EOMs intact.  EARS: External exam WNL, canals clear. Hearing grossly normal.  NOSE: No deformity or discharge.  MOUTH/THROAT: Lips w/o lesions  RESPIRATORY: Breathing is even, unlabored. Lung sounds are clear   CARDIOVASCULAR: Heart RRR no murmurs, rubs or gallops. No peripheral edema.   GASTROINTESTINAL: Abdomen is soft, non-tender, not distended w/ normal bowel sounds. GENITOURINARY: Bladder non tender, not distended  MUSCULOSKELETAL: No abnormal joints or musculature NEUROLOGIC:  Cranial nerves 2-12 grossly intact. Moves all extremities  PSYCHIATRIC: Mood and affect appropriate to situation, no behavioral issues  Patient Active Problem List   Diagnosis Date Noted  . Pressure injury of skin 07/11/2016  . Fall 07/10/2016  . UTI (urinary tract infection) 07/10/2016  . Rhabdomyolysis 07/10/2016  . Syncope 07/10/2016  . 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 Sepsis ([place]) 07/10/2016  . Urinary tract infection with hematuria   . Brain tumor ([place])   . Antineoplastic chemotherapy induced anemia 04/02/2015  . Weakness generalized 01/29/2015  . Dehydration 01/28/2015  . Rash 01/28/2015  . Chemotherapy-induced nausea 01/08/2015  . Chemotherapy induced diarrhea 01/08/2015  . Mucositis due to chemotherapy 01/08/2015  . Breast cancer of upper-outer quadrant of left female breast  ([place]) 12/20/2014  . Well adult exam 10/26/2012  . Dyslipidemia 06/23/2011  . Hypertension 12/22/2010  . UPPER RESPIRATORY INFECTION, ACUTE 04/30/2010  . Anxiety state 10/28/2009  . VITAMIN D DEFICIENCY 04/24/2009  . HYPERGLYCEMIA 04/24/2009  . VITAMIN B12 DEFICIENCY 10/22/2008  . ALLERGIC RHINITIS 05/09/2007  . GERD 05/09/2007      Labs reviewed: Basic Metabolic Panel:    Component Value Date/Time   NA 136 07/14/2016 0530   NA 136 (A) 07/14/2016   NA 141 10/07/2015 0843   K 3.7 07/14/2016 0530   K 3.4 (L) 10/07/2015 0843   CL 102 07/14/2016 0530   CO2 24 07/14/2016 0530   CO2 22 10/07/2015 0843   GLUCOSE 133 (H) 07/14/2016 0530   GLUCOSE 121 10/07/2015 0843   GLUCOSE 112 (H) 07/15/2006 0921   BUN 27 (H) 07/14/2016 0530   BUN 27 (A) 07/14/2016   BUN 24.0 10/07/2015 0843   CREATININE 0.99 07/14/2016 0530   CREATININE 1.2 (H) 10/07/2015 0843   CALCIUM 8.1 (L) 07/14/2016 0530   CALCIUM 10.2 10/07/2015 0843   PROT 7.6 07/09/2016 2339   PROT 7.6 10/07/2015 0843   ALBUMIN 4.1 07/09/2016 2339   ALBUMIN 3.8 10/07/2015 0843   AST 71 (H) 07/09/2016 2339   AST 13 10/07/2015 0843   ALT 22 07/09/2016 2339   ALT 13 10/07/2015 0843   ALKPHOS 68 07/09/2016 2339   ALKPHOS 76 10/07/2015 0843   BILITOT 0.9 07/09/2016 2339   BILITOT 0.35 10/07/2015 0843   GFRNONAA 56 (L) 07/14/2016 0530   GFRAA >60 07/14/2016 0530     Recent Labs  07/11/16 0517 07/13/16 0528 07/14/16 07/14/16 0530  NA 133* 135 136* 136  K 3.8 4.0  --  3.7  CL 100* 103  --  102  CO2 24 24  --  24  GLUCOSE 145* 132*  --  133*  BUN 31* 36* 27* 27*  CREATININE 1.26* 1.14* 1.0 0.99  CALCIUM 8.3* 8.2*  --  8.1*   Liver Function Tests:  Recent Labs  09/16/15 0844 10/07/15 0843 07/09/16 2339  AST 11 13 71*  ALT 11 13 22   ALKPHOS 70 76 68  BILITOT 0.39 0.35 0.9  PROT 7.7 7.6 7.6  ALBUMIN 3.8 3.8 4.1   No results for input(s): LIPASE, AMYLASE in the last 8760 hours. No results for input(s): AMMONIA  in the last 8760 hours. CBC:  Recent Labs  09/16/15 0844 10/07/15  [id number] 07/09/16 07/09/16 2339 07/14/16 07/14/16 0530  WBC 7.6 8.1 16.3 16.3* 11.4 11.4*  NEUTROABS 3.9 3.3  --  13.5*  --   --   HGB 12.8 12.6 15.3 15.3*  --  9.5*  HCT 38.1 37.9 44 44.4  --  28.8*  MCV 98.5 97.1  --  91.5  --  94.7  PLT 193 216 222 222  --  208   Lipid No results for input(s): CHOL, HDL, LDLCALC, TRIG in the last 8760 hours.  Cardiac Enzymes:  Recent Labs  07/10/16 0420 07/11/16 0625 07/13/16 0528  CKTOTAL 9,810* 3,599* 2,244*  TROPONINI 0.78*  --   --    BNP:  Recent Labs  07/10/16 0420  BNP 222.2*   No results found for: [facility] Lab Results  Component Value Date   HGBA1C 6.4 11/05/2014   Lab Results  Component Value Date   TSH 1.49 11/05/2014   Lab Results  Component Value Date   VITAMINB12 1,471 (H) 11/05/2014   No results found for: FOLATE No results found for: IRON, TIBC, FERRITIN  Imaging and Procedures obtained prior to SNF admission: Dg Chest 1 View  Result Date: 07/09/2016 CLINICAL DATA:  Found on floor after fall EXAM: CHEST 1 VIEW COMPARISON:  12/31/2014 FINDINGS: A right-sided chest port is in place, the superior portion of the catheter is not included on the image. The tip of the catheter projects over expected location of SVC. Surgical clips in the left axilla and within the right upper quadrant. There is mild cardiomegaly without overt failure. Mediastinal contour is exaggerated by patient rotation. Mild atelectasis left base. No acute infiltrate. No pneumothorax. IMPRESSION: 1. Mild cardiomegaly without overt failure 2. Mild atelectasis at the left lung base. Electronically Signed   By: Donavan Foil M.D.   On: 07/09/2016 23:38   Dg Lumbar Spine Complete  Result Date: 07/09/2016 CLINICAL DATA:  Fall extremity weakness EXAM: LUMBAR SPINE - COMPLETE 4+ VIEW COMPARISON:  None. FINDINGS: Surgical clips are present in the right upper quadrant. There are 5 non  rib-bearing lumbar type vertebra. Calcified pelvic phleboliths. Lumbar alignment within normal limits. Severe narrowing at L5-S1 with endplate changes. Moderate narrowing at L1-L2. The set degenerative changes of the lower lumbar spine. Atherosclerosis of the aorta. IMPRESSION: Degenerative changes.  No acute osseous abnormality. Electronically Signed   By: Donavan Foil M.D.   On: 07/09/2016 23:42   Dg Pelvis 1-2 Views  Result Date: 07/09/2016 CLINICAL DATA:  Found down, fall with leg weakness EXAM: PELVIS - 1-2 VIEW COMPARISON:  None. FINDINGS: SI joints are symmetric. Pubic symphysis is intact. No fracture or dislocation. IMPRESSION: No acute osseous abnormality Electronically Signed   By: Donavan Foil M.D.   On: 07/09/2016 23:45   Dg Shoulder Right  Result Date: 07/09/2016 CLINICAL DATA:  Fall EXAM: RIGHT SHOULDER - 2+ VIEW COMPARISON:  None. FINDINGS: There is no evidence of fracture or dislocation. There is no evidence of arthropathy or other focal bone abnormality. Soft tissues are unremarkable. IMPRESSION: Negative. Electronically Signed   By: Donavan Foil M.D.   On: 07/09/2016 23:45   Ct Head Wo Contrast  Result Date: 07/10/2016 CLINICAL DATA:  Status post fall with impact to posterior head. EXAM: CT HEAD WITHOUT CONTRAST CT CERVICAL SPINE WITHOUT CONTRAST TECHNIQUE: Multidetector CT imaging of the head and cervical spine was performed following the standard protocol without intravenous contrast. Multiplanar CT image reconstructions of the cervical spine were also generated. COMPARISON:  None. FINDINGS: CT HEAD FINDINGS  Brain: There is a cystic mass within the anterior right temporal lobe that measures 3.9 x 3.5 cm with adjacent vasogenic edema extending into the right temporal and lower parietal white matter. There is no midline shift or other mass effect. No hydrocephalus. No intracranial hemorrhage or evidence of acute cortical infarct. The remainder the brain parenchyma is normal. No age  advanced or lobar predominant atrophy. Vascular: Atherosclerotic calcification of the vertebral and internal carotid arteries at the skullbase is present. Skull: No skull fracture or focal calvarial lesion. Visualized skull base is normal. Sinuses/Orbits: No sinus fluid levels or advanced mucosal thickening. No mastoid effusion. Normal orbits. CT CERVICAL SPINE FINDINGS Alignment: No static subluxation. Facets are aligned. Occipital condyles are normally positioned. Skull base and vertebrae: No acute fracture. Soft tissues and spinal canal: No prevertebral fluid or swelling. No visible canal hematoma. Disc levels: No advanced spinal canal or neural foraminal stenosis. Upper chest: Left subarticular predominant C5-C6 disc osteophyte complex narrows the left lateral recess and left neural foramen. Other: Normal visualized paraspinal cervical soft tissues. IMPRESSION: 1. Predominantly cystic mass within the anterior right temporal lobe with mild surrounding vasogenic edema. Given the patient's history of breast cancer, this is favored to indicate metastatic disease. A primary CNS neoplasm, such as a glioma, is also a consideration. MRI is recommended for further characterization. 2. No midline shift or mass effect. 3. No acute fracture or static subluxation of the cervical spine. Electronically Signed   By: Ulyses Jarred M.D.   On: 07/10/2016 02:23   Ct Cervical Spine Wo Contrast  Result Date: 07/10/2016 CLINICAL DATA:  Status post fall with impact to posterior head. EXAM: CT HEAD WITHOUT CONTRAST CT CERVICAL SPINE WITHOUT CONTRAST TECHNIQUE: Multidetector CT imaging of the head and cervical spine was performed following the standard protocol without intravenous contrast. Multiplanar CT image reconstructions of the cervical spine were also generated. COMPARISON:  None. FINDINGS: CT HEAD FINDINGS Brain: There is a cystic mass within the anterior right temporal lobe that measures 3.9 x 3.5 cm with adjacent vasogenic  edema extending into the right temporal and lower parietal white matter. There is no midline shift or other mass effect. No hydrocephalus. No intracranial hemorrhage or evidence of acute cortical infarct. The remainder the brain parenchyma is normal. No age advanced or lobar predominant atrophy. Vascular: Atherosclerotic calcification of the vertebral and internal carotid arteries at the skullbase is present. Skull: No skull fracture or focal calvarial lesion. Visualized skull base is normal. Sinuses/Orbits: No sinus fluid levels or advanced mucosal thickening. No mastoid effusion. Normal orbits. CT CERVICAL SPINE FINDINGS Alignment: No static subluxation. Facets are aligned. Occipital condyles are normally positioned. Skull base and vertebrae: No acute fracture. Soft tissues and spinal canal: No prevertebral fluid or swelling. No visible canal hematoma. Disc levels: No advanced spinal canal or neural foraminal stenosis. Upper chest: Left subarticular predominant C5-C6 disc osteophyte complex narrows the left lateral recess and left neural foramen. Other: Normal visualized paraspinal cervical soft tissues. IMPRESSION: 1. Predominantly cystic mass within the anterior right temporal lobe with mild surrounding vasogenic edema. Given the patient's history of breast cancer, this is favored to indicate metastatic disease. A primary CNS neoplasm, such as a glioma, is also a consideration. MRI is recommended for further characterization. 2. No midline shift or mass effect. 3. No acute fracture or static subluxation of the cervical spine. Electronically Signed   By: Ulyses Jarred M.D.   On: 07/10/2016 02:23   Mr Jeri Cos [id number] Contrast  Result  Date: 07/10/2016 CLINICAL DATA:  70 y/o  F; brain tumor. EXAM: MRI HEAD WITHOUT AND WITH CONTRAST TECHNIQUE: Multiplanar, multiecho pulse sequences of the brain and surrounding structures were obtained without and with intravenous contrast. Brain lab protocol CONTRAST:  72mL MULTIHANCE  GADOBENATE DIMEGLUMINE 529 MG/ML IV SOLN COMPARISON:  07/10/2016 CT head. FINDINGS: Mass centered in the right mid superior temporal lobe with expansion of the superior temporal gyrus measuring 49 x 39 x 31 mm (AP x ML x CC, series 11: Image 8 and 12:33). The mass demonstrates a thin peripheral rim of enhancement with diffusion restriction, a central T2 hyperintense and T1 hypointense fluid collection, thin septations, and small surrounding area of nonenhancing T2 FLAIR hyperintense signal abnormality in white matter consistent with vasogenic edema. Local mass effect effaces the temporal horn of right lateral ventricle and results in minimal right-sided uncal herniation and ambient cistern effacement. No significant midline shift. Diffusion tensor imaging demonstrates mild medial displacement of the inferior longitudinal fasciculus which abuts the inferomedial margin of the mass (653:23). Additionally, the optic radiations are medially displaced and abut the posterior superiomedial aspect of the mass (653:26). Decreased anisotropy within the surrounding white matter tracts, optic radiations, and inferior longitudinal fasciculus is likely due to vasogenic edema. If the mass is a primary CNS tumor, then nonenhancing tumor infiltration is also possible. IMPRESSION: Cystic mass centered in the mid superior right temporal lobe with thin peripheral enhancement. 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 There is medial displacement of the right inferior longitudinal fasciculus and right optic radiations which abut the medial aspect of tumor as described. Electronically Signed   By: Kristine Garbe M.D.   On: 07/10/2016 20:16   Dg Knee Complete 4 Views Left  Result Date: 07/09/2016 CLINICAL DATA:  Found on floor, fall weakness EXAM: LEFT KNEE - COMPLETE 4+ VIEW COMPARISON:  None. FINDINGS: No acute displaced fracture or dislocation. Mild narrowing of the medial and lateral  joint space compartments with bony spurring. Superior and inferior patellar osteophytes. Vascular calcifications. IMPRESSION: No acute osseous abnormality Electronically Signed   By: Donavan Foil M.D.   On: 07/09/2016 23:40     Not all labs, radiology exams or other studies done during hospitalization come through on my EPIC note; however they are reviewed by me.    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 - initial presentation of tumor was a fainting or falling in which pt was on the floor, wedged between bed and dresser for some hours; [place] of 130[address]; Cr 1.6; both improved with IVF; Cr 0.99 on d/c ; syncope felt to be neurogenic SNF - will f/u bmp  CYSTIC MASS IN MID R TEMPORAL LOBE OF BRAIN- evaluation for fall included CT/MRI brain which showed above lesion; pt underwent excision on 12/4 and on 12/7 path came back as metastatic breast CA; pt dosed with decadron and keppra post surgery SNF - will cont decadron 2 mg q 6 and keppra 750 mg BID; pt will f/u with Nsurg in 1 week  BREAST CA, RECURRENT/METASTATIC - MASTECTOMY 05/2015, has been on anastrozole;  Will f/u with Onc  L KNEE PAIN -  Xrays neg; difficulty with weight bearing has improved SNF - PT will monitor  GERD SNF - cont omeprazole 20 mg daily  ANXIETY SNF- cont lorazepam 0.5 mg BID  HTN SNF - cont hyzaar 100-25 1 po daily, toprolXL 25 mg daily and hytrin 5 mg daily    Time spent > 45 min;> 50% of  time with patient was spent reviewing records, labs, tests and studies, counseling and developing plan of care  Noah Delaine. Sheppard Coil, MD

## 2016-07-18 ENCOUNTER — Encounter: Payer: Self-pay | Admitting: Internal Medicine

## 2016-07-18 DIAGNOSIS — C50919 Malignant neoplasm of unspecified site of unspecified female breast: Secondary | ICD-10-CM

## 2016-07-18 DIAGNOSIS — C719 Malignant neoplasm of brain, unspecified: Secondary | ICD-10-CM

## 2016-07-18 DIAGNOSIS — M25562 Pain in left knee: Secondary | ICD-10-CM

## 2016-07-18 DIAGNOSIS — C7931 Secondary malignant neoplasm of brain: Secondary | ICD-10-CM

## 2016-07-18 HISTORY — DX: Malignant neoplasm of brain, unspecified: C71.9

## 2016-07-18 HISTORY — DX: Pain in left knee: M25.562

## 2016-07-18 HISTORY — DX: Malignant neoplasm of unspecified site of unspecified female breast: C50.919

## 2016-07-18 HISTORY — DX: Secondary malignant neoplasm of brain: C79.31

## 2016-07-20 ENCOUNTER — Telehealth: Payer: Self-pay

## 2016-07-20 LAB — BASIC METABOLIC PANEL
BUN: 46 mg/dL — AB (ref 4–21)
Creatinine: 1.2 mg/dL — AB (ref 0.5–1.1)
GLUCOSE: 115 mg/dL
Potassium: 3.4 mmol/L (ref 3.4–5.3)
Sodium: 136 mmol/L — AB (ref 137–147)

## 2016-07-20 LAB — CBC AND DIFFERENTIAL
HEMATOCRIT: 33 % — AB (ref 36–46)
Hemoglobin: 10.9 g/dL — AB (ref 12.0–16.0)
PLATELETS: 264 10*3/uL (ref 150–399)
WBC: 14.1 10*3/mL

## 2016-07-20 NOTE — Telephone Encounter (Signed)
  Received a call [facility], [facility], regarding pt new admission from recent fall. Pt instructed to f/u with md in 1 week. Called rehab center back to confirm appt for next tues. Will fax appt schedule to [phone].

## 2016-07-23 ENCOUNTER — Other Ambulatory Visit: Payer: Self-pay | Admitting: Radiation Therapy

## 2016-07-23 DIAGNOSIS — C7931 Secondary malignant neoplasm of brain: Secondary | ICD-10-CM

## 2016-07-23 DIAGNOSIS — C7949 Secondary malignant neoplasm of other parts of nervous system: Principal | ICD-10-CM

## 2016-07-27 ENCOUNTER — Ambulatory Visit
Admission: RE | Admit: 2016-07-27 | Discharge: 2016-07-27 | Disposition: A | Discharge status: Home / Self Care | Payer: Medicare Other | Source: Ambulatory Visit | Attending: Radiation Oncology | Admitting: Radiation Oncology

## 2016-07-27 ENCOUNTER — Telehealth: Payer: Self-pay | Admitting: Radiation Oncology

## 2016-07-27 ENCOUNTER — Encounter: Payer: Self-pay | Admitting: Radiation Oncology

## 2016-07-27 VITALS — BP 152/78 | HR 56 | Resp 18 | Ht 63.0 in | Wt 180.0 lb

## 2016-07-27 DIAGNOSIS — C7949 Secondary malignant neoplasm of other parts of nervous system: Principal | ICD-10-CM

## 2016-07-27 DIAGNOSIS — Z9221 Personal history of antineoplastic chemotherapy: Secondary | ICD-10-CM | POA: Diagnosis not present

## 2016-07-27 DIAGNOSIS — C7931 Secondary malignant neoplasm of brain: Secondary | ICD-10-CM | POA: Insufficient documentation

## 2016-07-27 DIAGNOSIS — Z170 Estrogen receptor positive status [ER+]: Secondary | ICD-10-CM | POA: Diagnosis not present

## 2016-07-27 DIAGNOSIS — Z87442 Personal history of urinary calculi: Secondary | ICD-10-CM | POA: Insufficient documentation

## 2016-07-27 DIAGNOSIS — D649 Anemia, unspecified: Secondary | ICD-10-CM | POA: Insufficient documentation

## 2016-07-27 DIAGNOSIS — Z880 Allergy status to penicillin: Secondary | ICD-10-CM | POA: Insufficient documentation

## 2016-07-27 DIAGNOSIS — F419 Anxiety disorder, unspecified: Secondary | ICD-10-CM | POA: Diagnosis not present

## 2016-07-27 DIAGNOSIS — M5136 Other intervertebral disc degeneration, lumbar region: Secondary | ICD-10-CM | POA: Diagnosis not present

## 2016-07-27 DIAGNOSIS — K219 Gastro-esophageal reflux disease without esophagitis: Secondary | ICD-10-CM | POA: Insufficient documentation

## 2016-07-27 DIAGNOSIS — Z510 Encounter for antineoplastic radiation therapy: Secondary | ICD-10-CM | POA: Insufficient documentation

## 2016-07-27 DIAGNOSIS — I119 Hypertensive heart disease without heart failure: Secondary | ICD-10-CM | POA: Insufficient documentation

## 2016-07-27 DIAGNOSIS — I517 Cardiomegaly: Secondary | ICD-10-CM | POA: Insufficient documentation

## 2016-07-27 DIAGNOSIS — E559 Vitamin D deficiency, unspecified: Secondary | ICD-10-CM | POA: Insufficient documentation

## 2016-07-27 DIAGNOSIS — E669 Obesity, unspecified: Secondary | ICD-10-CM | POA: Insufficient documentation

## 2016-07-27 DIAGNOSIS — E538 Deficiency of other specified B group vitamins: Secondary | ICD-10-CM | POA: Insufficient documentation

## 2016-07-27 DIAGNOSIS — C50912 Malignant neoplasm of unspecified site of left female breast: Secondary | ICD-10-CM

## 2016-07-27 DIAGNOSIS — C50412 Malignant neoplasm of upper-outer quadrant of left female breast: Secondary | ICD-10-CM | POA: Insufficient documentation

## 2016-07-27 DIAGNOSIS — Z79811 Long term (current) use of aromatase inhibitors: Secondary | ICD-10-CM | POA: Diagnosis not present

## 2016-07-27 HISTORY — DX: Malignant neoplasm of brain, unspecified: C71.9

## 2016-07-27 NOTE — Progress Notes (Addendum)
Location/Histology of Brain Tumor: metastatic breast ca to right temporal lobe  Patient presented with symptoms of:  Explains she has a house in [place] that is being renovated thus, she was residing at the [facility] extended stay when she experience a syncopal episode. She reports that she remembers experiencing left eye pain, left facial numbness and drooling just prior to passing out.   Past or anticipated interventions, if any, per neurosurgery: craniotomy for excision of brain tumor using stereotactic navigation done 07/13/16  Past or anticipated interventions, if any, per medical oncology:  Breast cancer of upper-outer quadrant of left female breast ([place])   12/07/2014 Initial Diagnosis    Left breast 2:00 position: Invasive ductal carcinoma grade 2, ER 5%, PR 0%, Ki-67 80%, HER-2 positive ratio 6.96      12/07/2014 Mammogram    Large left breast mass 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, 2 small benign nodules in the right breast stable since 2002, other consistent with normal intramammary lymph node left axilla ultrasound normal-sized lymph nodes      12/31/2014 - 04/22/2015 Neo-Adjuvant Chemotherapy    [facility] Perjeta neoadjuvant chemotherapy 6 (decreased chemotherapy dosage with cycle 2 and discontinued Perjeta for diarrhea)       04/29/2015 Breast MRI    Significant interval decrease in size of the mass, continued skin retraction, 4.3 x 2 x 1.8 cm (previously 4.9 x 3.9 x 3.9 cm)      05/30/2015 Surgery    Left mastectomy: Path CR, 0/6 LN Negative      09/09/2015 -  Anti-estrogen oral therapy    Anastrozole 1 mg daily       Patient scheduled for follow up with [facility] on 07/28/16    Dose of Decadron, if applicable: decadron 2 mg every six hours  Recent neurologic symptoms, if any:   Seizures: No. 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  Headaches: Reports intermittent headaches even since surgery. Reports taking Tylenol to relieve headache. Reports  soreness at surgical site (right temporal lobe) with over exertion (shower plus PT).   Nausea: No  Dizziness/ataxia: No  Difficulty with hand coordination: No  Focal numbness/weakness: yes   Visual deficits/changes: No  Confusion/Memory deficits: Denies confusion. Reports lose of time. Drafts with conversation at time.   Painful bone metastases at present, if any: No  SAFETY ISSUES:  Prior radiation? No  Pacemaker/ICD? No  Possible current pregnancy? No  Is the patient on methotrexate? No  Additional Complaints / other details: 70 year old female. Residing at [facility] and participating in physical therapy. Reports she is now able to ambulate a short distance with aid of therapy while using walker. Reports she has worked at [employer] for 30 years.

## 2016-07-27 NOTE — Telephone Encounter (Signed)
Patient has not shown for consultation with Dr. Tammi Klippel. Phoned her home out of concern. No answer at the patient's home and no option to leave a message. Phoned caregiver, Karis Juba. She reports that neither were aware of the appointment and will need to reschedule. She request Manuela Schwartz reach out to her first to determine a date and time where she can attend the appointment. She reports that the patient is in rehab at [facility].

## 2016-07-27 NOTE — Progress Notes (Signed)
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 A diagnosis of Malignant neoplasm of upper-outer quadrant of left breast in female, estrogen receptor positive (HCC) was also pertinent to this visit.   70 y.o. woman status post resection of a 4.9 cm right temporal brain metastasis from upper outer metastatic left breast cancer (ER positive, PR negative, HER2 positive)    ICD-9-CM ICD-10-CM   1. 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 Malignant neoplasm of upper-outer quadrant of left breast in female, estrogen receptor positive (HCC) 174.4 C50.412    V86.0 Z17.0     HISTORY OF PRESENT ILLNESS::Savannah Howard is a 70 y.o. female who is followed by Dr. Pamelia Hoit for Clinical stage IIB (T3,N0,M0) grade 2-3 IDC of the left breast (ER positive, PR negative, HER2 positive), now with brain metastases. Here is her history:    Breast cancer of upper-outer quadrant of left female breast (HCC)   12/07/2014 Initial Diagnosis    Left breast 2:00 position: Invasive ductal carcinoma grade 2, ER 5%, PR 0%, Ki-67 80%, HER-2 positive ratio 6.96      12/07/2014 Mammogram    Large left breast mass 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, 2 small benign nodules in the right breast stable since 2002, other consistent with normal intramammary lymph node left axilla ultrasound normal-sized lymph nodes      12/31/2014 - 04/22/2015 Neo-Adjuvant Chemotherapy    TCH Perjeta neoadjuvant chemotherapy 6 (decreased chemotherapy dosage with cycle 2 and discontinued Perjeta for diarrhea)       04/29/2015 Breast MRI    Significant interval decrease in size of the mass, continued skin  retraction, 4.3 x 2 x 1.8 cm (previously 4.9 x 3.9 x 3.9 cm)      05/30/2015 Surgery    Left mastectomy: Path CR, 0/6 LN Negative      09/09/2015 -  Anti-estrogen oral therapy    Anastrozole 1 mg daily      The patient had a screening mammogram noting a left breast mass measuring 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 in the 2:00 position 2cmfn. Ultrasound at the time revealed no lymphadenopathy. Biopsy at that time revealed grade 2 IDC (ER 5% positive, PR 0% negative, HER2 positive, Ki67 80%).  The patient underwent neo-adjuvant chemotherapy 12/31/14 - 04/21/16 with TCH Perjeta x 6 (decreased chemotherapy dosage with cycle 2 and discontinued Perjeta for diarrhea). MRI of the breasts on 04/27/16 showed the mass has decreased in size from 4.9 x 3.9 x 3.9 cm (MRI on 01/03/16) to 4.3 x 2.0 x 1.8 cm.  The patient underwent a left mastectomy on 05/30/15 by Dr. Carolynne Edouard. No malignancy was identified and the final pathology was ypT0, ypN0 with 6 negative lymph nodes. The patient was subsequently placed on Anastrozole and has been on it since 09/09/15.  On 07/09/16, the patient presented to the ED for a fall and weakness. The patient became wedged between her bed and dresser at home and was alone at home. It took her 20 hours to reach a phone and call for help.  CT of the head on 07/10/16 showed a 3.9 x 3.5 cystic mass in the anterior right temporal lobe  with adjacent vasogenic edema that is favored to be metastatic disease. No midline shift or mass effect was noted.  MRI of the brain the same day showed a 4.9 x 3.9 x 3.1 cm mass in the right mid superior temporal lobe with expansion of the superior temporal gyrus. There was thin peripheral enhancement. Signal characteristics favor metastatic disease over a primary CNS neoplasm. There was medial displacement of the right inferior longitudinal fasciculus and right optic radiations which abut the medial aspect of the tumor.  CT of the chest/abd/pelvis on 07/11/16 was negative for  metastatic disease.  The patient underwent a supratentorial craniotomy for excision of the right temporal brain tumor on 07/13/16 by Dr. Cyndy Freeze. The findings were consistent with metastatic breast carcinoma (ER 40% positive, PR 0% negative, HER2 positive).  MRI of the brain on 07/14/16 showed a right temporal craniotomy for the resection of temporal lobe tumor, residual enhancement along the anterior and superior margin of the resection cavity likely reflecting some residual tumor, restricted diffusion along the posterior margin of the surgical cavity that likely reflects infarct., and a stable 1.3 x 1.0 x 1.0 cm left parietal lobe metastasis.  The patient presents today to discuss radiation for the management of her disease.  PREVIOUS RADIATION THERAPY: No  Past Medical History:  Diagnosis Date  . Allergy    rhinitis  . Anemia   . Anxiety   . Brain cancer ([place])    breast ca with mets to brain  . Breast cancer ([place])   . DDD (degenerative disc disease), lumbar dx'd in 1980's  . GERD (gastroesophageal reflux disease)   . Heart murmur   . History of blood transfusion 2016 X 3 (05/30/2015)  . Hypertension   . Kidney stone 1990's X 1   "passed it"  . Obesity   . Primary cancer of upper outer quadrant of left breast ([place]) dx'd 03/2015  . Vitamin B 12 deficiency   . Vitamin D deficiency   :   Past Surgical History:  Procedure Laterality Date  . APPLICATION OF CRANIAL NAVIGATION N/A 07/13/2016   Procedure: APPLICATION OF CRANIAL NAVIGATION;  Surgeon: Kevan Ny Ditty, MD;  Location: [place];  Service: Neurosurgery;  Laterality: N/A;  . BREAST BIOPSY Left ~ 12/2014  . CHOLECYSTECTOMY OPEN  ~ 1975  . ESOPHAGEAL DILATION  X 3  . MASTECTOMY COMPLETE / SIMPLE W/ SENTINEL NODE BIOPSY Left 05/30/2015  . MASTECTOMY W/ SENTINEL NODE BIOPSY Left 05/30/2015   Procedure: LEFT MASTECTOMY WITH SENTINEL LYMPH NODE BIOPSY;  Surgeon: Autumn Messing III, MD;  Location: [place];  Service: General;  Laterality:  Left;  . PORTACATH PLACEMENT Right 12/26/2014   Procedure: INSERTION PORT-A-CATH;  Surgeon: Autumn Messing III, MD;  Location: [place];  Service: General;  Laterality: Right;  . PR DURAL GRAFT REPAIR,SPINE DEFECT N/A 07/13/2016   Procedure: Marshia Ly STERIOTACTIC BIOPSY WITH STEALTH;  Surgeon: Kevan Ny Ditty, MD;  Location: [place];  Service: Neurosurgery;  Laterality: N/A;  :   Current Outpatient Prescriptions:  .  acetaminophen (TYLENOL) 500 MG tablet, Take 1,000 mg by mouth at bedtime. , Disp: , Rfl:  .  anastrozole (ARIMIDEX) 1 MG tablet, Take 1 mg by mouth daily., Disp: , Rfl:  .  cyanocobalamin (,VITAMIN B-12,) 1000 MCG/ML injection, Inject 1,000 mcg into the muscle every 30 (thirty) days., Disp: , Rfl:  .  dexamethasone (DECADRON) 2 MG tablet, Take 2 mg by mouth every 6 (six) hours., Disp: , Rfl:  .  docusate  sodium (COLACE) 100 MG capsule, Take 1 capsule (100 mg total) by mouth 2 (two) times daily., Disp: 10 capsule, Rfl: 0 .  levETIRAcetam (KEPPRA) 750 MG tablet, Take 1 tablet (750 mg total) by mouth 2 (two) times daily., Disp: , Rfl:  .  LORazepam (ATIVAN) 0.5 MG tablet, Take 0.5 mg by mouth 2 (two) times daily., Disp: , Rfl:  .  losartan-hydrochlorothiazide (HYZAAR) 100-25 MG tablet, Take 1 tablet by mouth daily., Disp: , Rfl:  .  metoprolol succinate (TOPROL-XL) 25 MG 24 hr tablet, Take 25 mg by mouth daily., Disp: , Rfl:  .  omeprazole (PRILOSEC) 20 MG capsule, Take 20 mg by mouth daily., Disp: , Rfl:  .  oxyCODONE-acetaminophen (PERCOCET/ROXICET) 5-325 MG tablet, Take 1 tablet by mouth every 6 (six) hours as needed., Disp: , Rfl:  .  potassium chloride (MICRO-K) 10 MEQ CR capsule, Take 10 mEq by mouth daily., Disp: , Rfl:  .  terazosin (HYTRIN) 5 MG capsule, Take 5 mg by mouth at bedtime. Do not crush, Disp: , Rfl:  .  vitamin B-12 (CYANOCOBALAMIN) 1000 MCG tablet, Take 1,000 mcg by mouth daily., Disp: , Rfl: :   Allergies  Allergen Reactions  . Penicillins  Other (See Comments)    REACTION: genital swelling and skin peeled ? yeast infection ?  . Sulfonamide Derivatives Other (See Comments)    convulsions   :   Family History  Problem Relation Age of Onset  . Heart disease Mother     CAD  . COPD Mother   . Heart disease Father 71    leaky valve  . Parkinsonism Father   . Hypertension Other   :   Social History   Social History  . Marital status: Single    Spouse name: N/A  . Number of children: N/A  . Years of education: N/A   Occupational History  . 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 Smoking status: Never Smoker  . Smokeless tobacco: Never Used  . Alcohol use Yes     Comment: 05/30/2015 "I might take a drink of wine 1-2 times/yr"  . Drug use: No  . Sexual activity: Not Currently   Other Topics Concern  . Not on file   Social History Narrative   Regular exercise- no  :  REVIEW OF SYSTEMS:  A 15 point review of systems is documented in the electronic medical record. This was obtained by the nursing staff. However, I reviewed this with the patient to discuss relevant findings and make appropriate changes.  Pertinent items noted in HPI and remainder of comprehensive ROS otherwise negative.   The patient reports intermittent headaches since surgery. Reports taking Tylenol to relieve them. She reports soreness at the surgical site (right temporal lobe) with overexertion and while taking a shower. She denies seizures, but reports she had Bell's Palsy 15-20 years ago. She denies nausea, dizziness, difficulty with hand coordination, or visual changes. Reports focal numbness/weakness, loss of time, and some difficulty holding a conservation at times. The patient states she going through rehab and is ambulatory a short distance with a walker.   PHYSICAL EXAM:  Blood pressure (!) 152/78, pulse (!) 56, resp. rate 18, height 5\' 3"  (1.6 m), weight 180 lb (81.6 kg), SpO2 99 %. General: Alert and oriented, in no  acute distress Skin: Right temporal incision well healed with sutures in place. No sign of infection. Musculoskeletal: symmetric strength and muscle tone throughout. Presents in a wheelchair. Neurologic:  Cranial nerves II through XII are grossly intact. No obvious focalities. Speech is fluent. Coordination is intact. Psychiatric: Judgment and insight are intact. Affect is appropriate.  KPS = 70  100 - Normal; no complaints; no evidence of disease. 90   - Able to carry on normal activity; minor signs or symptoms of disease. 80   - Normal activity with effort; some signs or symptoms of disease. 41   - Cares for self; unable to carry on normal activity or to do active work. 60   - Requires occasional assistance, but is able to care for most of his personal needs. 50   - Requires considerable assistance and frequent medical care. 62   - Disabled; requires special care and assistance. 22   - Severely disabled; hospital admission is indicated although death not imminent. 30   - Very sick; hospital admission necessary; active supportive treatment necessary. 10   - Moribund; fatal processes progressing rapidly. 0     - Dead  Karnofsky DA, Abelmann [place], Craver LS and Burchenal [facility] [phone]) The use of the nitrogen mustards in the palliative treatment of carcinoma: with particular reference to bronchogenic carcinoma Cancer 1 634-56  LABORATORY DATA:  Lab Results  Component Value Date   WBC 14.1 07/20/2016   HGB 10.9 (A) 07/20/2016   HCT 33 (A) 07/20/2016   MCV 94.7 07/14/2016   PLT 264 07/20/2016   Lab Results  Component Value Date   NA 136 (A) 07/20/2016   K 3.4 07/20/2016   CL 102 07/14/2016   CO2 24 07/14/2016   Lab Results  Component Value Date   ALT 22 07/09/2016   AST 71 (H) 07/09/2016   ALKPHOS 68 07/09/2016   BILITOT 0.9 07/09/2016     RADIOGRAPHY: Dg Chest 1 View  Result Date: 07/09/2016 CLINICAL DATA:  Found on floor after fall EXAM: CHEST 1 VIEW COMPARISON:  12/31/2014  FINDINGS: A right-sided chest port is in place, the superior portion of the catheter is not included on the image. The tip of the catheter projects over expected location of SVC. Surgical clips in the left axilla and within the right upper quadrant. There is mild cardiomegaly without overt failure. Mediastinal contour is exaggerated by patient rotation. Mild atelectasis left base. No acute infiltrate. No pneumothorax. IMPRESSION: 1. Mild cardiomegaly without overt failure 2. Mild atelectasis at the left lung base. Electronically Signed   By: Donavan Foil M.D.   On: 07/09/2016 23:38   Dg Chest 2 View  Result Date: 07/13/2016 CLINICAL DATA:  Preoperative chest radiograph.  Initial encounter. EXAM: CHEST  2 VIEW COMPARISON:  Chest radiograph performed 07/09/2016, and CT of the chest performed 07/11/2016 FINDINGS: The lungs are mildly hypoexpanded, with minimal bilateral atelectasis. There is no evidence of pleural effusion or pneumothorax. The heart is enlarged. A right-sided chest port is noted ending about the mid SVC. No acute osseous abnormalities are seen. Scattered clips are noted overlying the left axilla. Clips are noted within the right upper quadrant, reflecting prior cholecystectomy. IMPRESSION: Lungs mildly hypoexpanded, with minimal bilateral atelectasis. Cardiomegaly. Electronically Signed   By: Garald Balding M.D.   On: 07/13/2016 01:37   Dg Lumbar Spine Complete  Result Date: 07/09/2016 CLINICAL DATA:  Fall extremity weakness EXAM: LUMBAR SPINE - COMPLETE 4+ VIEW COMPARISON:  None. FINDINGS: Surgical clips are present in the right upper quadrant. There are 5 non rib-bearing lumbar type vertebra. Calcified pelvic phleboliths. Lumbar alignment within normal limits. Severe narrowing at L5-S1 with endplate changes. Moderate  narrowing at L1-L2. The set degenerative changes of the lower lumbar spine. Atherosclerosis of the aorta. IMPRESSION: Degenerative changes.  No acute osseous abnormality.  Electronically Signed   By: Donavan Foil M.D.   On: 07/09/2016 23:42   Dg Pelvis 1-2 Views  Result Date: 07/09/2016 CLINICAL DATA:  Found down, fall with leg weakness EXAM: PELVIS - 1-2 VIEW COMPARISON:  None. FINDINGS: SI joints are symmetric. Pubic symphysis is intact. No fracture or dislocation. IMPRESSION: No acute osseous abnormality Electronically Signed   By: Donavan Foil M.D.   On: 07/09/2016 23:45   Dg Shoulder Right  Result Date: 07/09/2016 CLINICAL DATA:  Fall EXAM: RIGHT SHOULDER - 2+ VIEW COMPARISON:  None. FINDINGS: There is no evidence of fracture or dislocation. There is no evidence of arthropathy or other focal bone abnormality. Soft tissues are unremarkable. IMPRESSION: Negative. Electronically Signed   By: Donavan Foil M.D.   On: 07/09/2016 23:45   Ct Head Wo Contrast  Result Date: 07/10/2016 CLINICAL DATA:  Status post fall with impact to posterior head. EXAM: CT HEAD WITHOUT CONTRAST CT CERVICAL SPINE WITHOUT CONTRAST TECHNIQUE: Multidetector CT imaging of the head and cervical spine was performed following the standard protocol without intravenous contrast. Multiplanar CT image reconstructions of the cervical spine were also generated. COMPARISON:  None. FINDINGS: CT HEAD FINDINGS Brain: There is a cystic mass within the anterior right temporal lobe that measures 3.9 x 3.5 cm with adjacent vasogenic edema extending into the right temporal and lower parietal white matter. There is no midline shift or other mass effect. No hydrocephalus. No intracranial hemorrhage or evidence of acute cortical infarct. The remainder the brain parenchyma is normal. No age advanced or lobar predominant atrophy. Vascular: Atherosclerotic calcification of the vertebral and internal carotid arteries at the skullbase is present. Skull: No skull fracture or focal calvarial lesion. Visualized skull base is normal. Sinuses/Orbits: No sinus fluid levels or advanced mucosal thickening. No mastoid effusion.  Normal orbits. CT CERVICAL SPINE FINDINGS Alignment: No static subluxation. Facets are aligned. Occipital condyles are normally positioned. Skull base and vertebrae: No acute fracture. Soft tissues and spinal canal: No prevertebral fluid or swelling. No visible canal hematoma. Disc levels: No advanced spinal canal or neural foraminal stenosis. Upper chest: Left subarticular predominant C5-C6 disc osteophyte complex narrows the left lateral recess and left neural foramen. Other: Normal visualized paraspinal cervical soft tissues. IMPRESSION: 1. Predominantly cystic mass within the anterior right temporal lobe with mild surrounding vasogenic edema. Given the patient's history of breast cancer, this is favored to indicate metastatic disease. A primary CNS neoplasm, such as a glioma, is also a consideration. MRI is recommended for further characterization. 2. No midline shift or mass effect. 3. No acute fracture or static subluxation of the cervical spine. Electronically Signed   By: Ulyses Jarred M.D.   On: 07/10/2016 02:23   Ct Chest W Contrast  Result Date: 07/11/2016 CLINICAL DATA:  Status post fall, with generalized chest and abdominal pain. Initial encounter. EXAM: CT CHEST, ABDOMEN, AND PELVIS WITH CONTRAST TECHNIQUE: Multidetector CT imaging of the chest, abdomen and pelvis was performed following the standard protocol during bolus administration of intravenous contrast. CONTRAST:  115m ISOVUE-300 IOPAMIDOL (ISOVUE-300) INJECTION 61% COMPARISON:  Lumbar spine radiographs performed 07/09/2016 FINDINGS: CT CHEST FINDINGS Cardiovascular: Diffuse coronary artery calcifications are seen. Scattered calcification is seen along the aortic arch and descending thoracic aorta. The heart remains normal in size. The great vessels are grossly unremarkable. Mediastinum/Nodes: The mediastinum is otherwise unremarkable in  appearance. No mediastinal lymphadenopathy is seen. No pericardial effusion is identified. The thyroid  gland is unremarkable. No axillary lymphadenopathy is seen. A 3.7 cm cyst at the left axilla may reflect a postoperative seroma, given adjacent clips. Lungs/Pleura: Minimal bibasilar atelectasis is noted. The lungs are otherwise clear. No pleural effusion or pneumothorax is seen. No masses are identified. Musculoskeletal: No acute osseous abnormalities are identified. The visualized musculature is unremarkable in appearance. CT ABDOMEN PELVIS FINDINGS Hepatobiliary: The liver is unremarkable in appearance. The patient is status post cholecystectomy, with clips noted at the gallbladder fossa. The common bile duct remains normal in caliber. Pancreas: The pancreas is within normal limits. Spleen: The spleen is unremarkable in appearance. Adrenals/Urinary Tract: The adrenal glands are unremarkable in appearance. Mild bilateral renal atrophy is noted, with nonspecific bilateral perinephric stranding. There is no evidence of hydronephrosis. No renal or ureteral stones are identified. Stomach/Bowel: The stomach is unremarkable in appearance. The small bowel is within normal limits. The appendix is normal in caliber, without evidence of appendicitis. Scattered diverticulosis is noted along the descending and sigmoid colon, without evidence of diverticulitis. Vascular/Lymphatic: Scattered calcification is seen along the abdominal aorta and its branches. The abdominal aorta is otherwise grossly unremarkable. The inferior vena cava is grossly unremarkable. No retroperitoneal lymphadenopathy is seen. No pelvic sidewall lymphadenopathy is identified. Reproductive: The bladder is mildly distended and within normal limits. The uterus is grossly unremarkable in appearance. The ovaries are relatively symmetric. No suspicious adnexal masses are seen. A 2.4 cm right adnexal cyst is likely physiologic in nature. Other: No additional soft tissue abnormalities are seen. Musculoskeletal: No acute osseous abnormalities are identified.  There is swelling at the quadriceps musculature of the proximal left thigh. Would correlate for associated bruising or any evidence of rhabdomyolysis. IMPRESSION: 1. Swelling at the quadriceps musculature of the proximal left thigh. Would correlate for any associated bruising or evidence of rhabdomyolysis. 2. Diffuse coronary artery calcifications seen. 3. 3.7 cm cyst at the left axilla may reflect a postoperative seroma, given adjacent clips. 4. Mild bilateral renal atrophy noted. 5. Scattered diverticulosis along the descending and sigmoid colon, without evidence of diverticulitis. 6. Scattered aortic atherosclerosis. Electronically Signed   By: Garald Balding M.D.   On: 07/11/2016 04:20   Ct Cervical Spine Wo Contrast  Result Date: 07/10/2016 CLINICAL DATA:  Status post fall with impact to posterior head. EXAM: CT HEAD WITHOUT CONTRAST CT CERVICAL SPINE WITHOUT CONTRAST TECHNIQUE: Multidetector CT imaging of the head and cervical spine was performed following the standard protocol without intravenous contrast. Multiplanar CT image reconstructions of the cervical spine were also generated. COMPARISON:  None. FINDINGS: CT HEAD FINDINGS Brain: There is a cystic mass within the anterior right temporal lobe that measures 3.9 x 3.5 cm with adjacent vasogenic edema extending into the right temporal and lower parietal white matter. There is no midline shift or other mass effect. No hydrocephalus. No intracranial hemorrhage or evidence of acute cortical infarct. The remainder the brain parenchyma is normal. No age advanced or lobar predominant atrophy. Vascular: Atherosclerotic calcification of the vertebral and internal carotid arteries at the skullbase is present. Skull: No skull fracture or focal calvarial lesion. Visualized skull base is normal. Sinuses/Orbits: No sinus fluid levels or advanced mucosal thickening. No mastoid effusion. Normal orbits. CT CERVICAL SPINE FINDINGS Alignment: No static subluxation.  Facets are aligned. Occipital condyles are normally positioned. Skull base and vertebrae: No acute fracture. Soft tissues and spinal canal: No prevertebral fluid or swelling. No visible canal  hematoma. Disc levels: No advanced spinal canal or neural foraminal stenosis. Upper chest: Left subarticular predominant C5-C6 disc osteophyte complex narrows the left lateral recess and left neural foramen. Other: Normal visualized paraspinal cervical soft tissues. IMPRESSION: 1. Predominantly cystic mass within the anterior right temporal lobe with mild surrounding vasogenic edema. Given the patient's history of breast cancer, this is favored to indicate metastatic disease. A primary CNS neoplasm, such as a glioma, is also a consideration. MRI is recommended for further characterization. 2. No midline shift or mass effect. 3. No acute fracture or static subluxation of the cervical spine. Electronically Signed   By: Ulyses Jarred M.D.   On: 07/10/2016 02:23   Mr Jeri Cos VO Contrast  Result Date: 07/14/2016 CLINICAL DATA:  Resection of cystic neoplasm yesterday. EXAM: MRI HEAD WITHOUT AND WITH CONTRAST TECHNIQUE: Multiplanar, multiecho pulse sequences of the brain and surrounding structures were obtained without and with intravenous contrast. CONTRAST:  92m MULTIHANCE GADOBENATE DIMEGLUMINE 529 MG/ML IV SOLN COMPARISON:  MRI of the brain 07/10/16. FINDINGS: Brain: A right temporal craniotomy is noted. The peripherally enhancing cystic lesion has been resected. Blood products are evident within the surgical cavity. There is residual enhancement along the anterior and superior margin of the surgical cavity. The longest contiguous area of enhancement is 3.4 cm. There is also some restricted diffusion along the posterior margin of the surgical cavity. Posterior T2 changes are again noted. A lesion in the high left parietal lobe measures 13 x 10 x 10 mm. No other focal enhancing lesions are present. White matter changes are  otherwise stable. Vascular: Flow is present in the major intracranial arteries. Skull and upper cervical spine: The skullbase is within normal limits. Calvarium is otherwise intact. Marrow signal is normal. The craniocervical junction is within normal limits. Sinuses/Orbits: Mild mucosal thickening is present within the anterior ethmoid air cells in the inferior right frontal sinus. Mastoid air cells are clear. The globes and orbits are intact IMPRESSION: 1. Right temporal craniotomy for resection of temporal lobe tumor, likely metastasis. 2. Residual enhancement along the anterior and superior margin of the resection cavity likely reflects some residual tumor. 3. Restricted diffusion along the posterior margin of the surgical cavity likely reflects infarct. There is no associated enhancement. 4. 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 5. Diffuse white matter disease is stable. These results were called by telephone at the time of interpretation on 07/14/2016 at 2:00 pm to Dr. BCyndy Freeze, who verbally acknowledged these results. Electronically Signed   By: CSan MorelleM.D.   On: 07/14/2016 14:04   Mr BJeri CosWJJContrast  Result Date: 07/10/2016 CLINICAL DATA:  71y/o  F; brain tumor. EXAM: MRI HEAD WITHOUT AND WITH CONTRAST TECHNIQUE: Multiplanar, multiecho pulse sequences of the brain and surrounding structures were obtained without and with intravenous contrast. Brain lab protocol CONTRAST:  870mMULTIHANCE GADOBENATE DIMEGLUMINE 529 MG/ML IV SOLN COMPARISON:  07/10/2016 CT head. FINDINGS: Mass centered in the right mid superior temporal lobe with expansion of the superior temporal gyrus measuring 49 x 39 x 31 mm (AP x ML x CC, series 11: Image 8 and 12:33). The mass demonstrates a thin peripheral rim of enhancement with diffusion restriction, a central T2 hyperintense and T1 hypointense fluid collection, thin septations, and small surrounding area of nonenhancing T2 FLAIR hyperintense  signal abnormality in white matter consistent with vasogenic edema. Local mass effect effaces the temporal horn of right lateral ventricle and results in minimal right-sided uncal  herniation and ambient cistern effacement. No significant midline shift. Diffusion tensor imaging demonstrates mild medial displacement of the inferior longitudinal fasciculus which abuts the inferomedial margin of the mass (653:23). Additionally, the optic radiations are medially displaced and abut the posterior superiomedial aspect of the mass (653:26). Decreased anisotropy within the surrounding white matter tracts, optic radiations, and inferior longitudinal fasciculus is likely due to vasogenic edema. If the mass is a primary CNS tumor, then nonenhancing tumor infiltration is also possible. IMPRESSION: Cystic mass centered in the mid superior right temporal lobe with thin peripheral enhancement. 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 There is medial displacement of the right inferior longitudinal fasciculus and right optic radiations which abut the medial aspect of tumor as described. Electronically Signed   By: Mitzi Hansen M.D.   On: 07/10/2016 20:16   Ct Abdomen Pelvis W Contrast  Result Date: 07/11/2016 CLINICAL DATA:  Status post fall, with generalized chest and abdominal pain. Initial encounter. EXAM: CT CHEST, ABDOMEN, AND PELVIS WITH CONTRAST TECHNIQUE: Multidetector CT imaging of the chest, abdomen and pelvis was performed following the standard protocol during bolus administration of intravenous contrast. CONTRAST:  ISOVUE-300 IOPAMIDOL (ISOVUE-300) INJECTION 61% COMPARISON:  Lumbar spine radiographs performed 07/09/2016 FINDINGS: CT CHEST FINDINGS Cardiovascular: Diffuse coronary artery calcifications are seen. Scattered calcification is seen along the aortic arch and descending thoracic aorta. The heart remains normal in size. The great vessels are grossly  unremarkable. Mediastinum/Nodes: The mediastinum is otherwise unremarkable in appearance. No mediastinal lymphadenopathy is seen. No pericardial effusion is identified. The thyroid gland is unremarkable. No axillary lymphadenopathy is seen. A 3.7 cm cyst at the left axilla may reflect a postoperative seroma, given adjacent clips. Lungs/Pleura: Minimal bibasilar atelectasis is noted. The lungs are otherwise clear. No pleural effusion or pneumothorax is seen. No masses are identified. Musculoskeletal: No acute osseous abnormalities are identified. The visualized musculature is unremarkable in appearance. CT ABDOMEN PELVIS FINDINGS Hepatobiliary: The liver is unremarkable in appearance. The patient is status post cholecystectomy, with clips noted at the gallbladder fossa. The common bile duct remains normal in caliber. Pancreas: The pancreas is within normal limits. Spleen: The spleen is unremarkable in appearance. Adrenals/Urinary Tract: The adrenal glands are unremarkable in appearance. Mild bilateral renal atrophy is noted, with nonspecific bilateral perinephric stranding. There is no evidence of hydronephrosis. No renal or ureteral stones are identified. Stomach/Bowel: The stomach is unremarkable in appearance. The small bowel is within normal limits. The appendix is normal in caliber, without evidence of appendicitis. Scattered diverticulosis is noted along the descending and sigmoid colon, without evidence of diverticulitis. Vascular/Lymphatic: Scattered calcification is seen along the abdominal aorta and its branches. The abdominal aorta is otherwise grossly unremarkable. The inferior vena cava is grossly unremarkable. No retroperitoneal lymphadenopathy is seen. No pelvic sidewall lymphadenopathy is identified. Reproductive: The bladder is mildly distended and within normal limits. The uterus is grossly unremarkable in appearance. The ovaries are relatively symmetric. No suspicious adnexal masses are seen. A 2.4  cm right adnexal cyst is likely physiologic in nature. Other: No additional soft tissue abnormalities are seen. Musculoskeletal: No acute osseous abnormalities are identified. There is swelling at the quadriceps musculature of the proximal left thigh. Would correlate for associated bruising or any evidence of rhabdomyolysis. IMPRESSION: 1. Swelling at the quadriceps musculature of the proximal left thigh. Would correlate for any associated bruising or evidence of rhabdomyolysis. 2. Diffuse coronary artery calcifications seen. 3. 3.7 cm cyst at the left axilla may reflect a postoperative  seroma, given adjacent clips. 4. Mild bilateral renal atrophy noted. 5. Scattered diverticulosis along the descending and sigmoid colon, without evidence of diverticulitis. 6. Scattered aortic atherosclerosis. Electronically Signed   By: Garald Balding M.D.   On: 07/11/2016 04:20   Dg Knee Complete 4 Views Left  Result Date: 07/09/2016 CLINICAL DATA:  Found on floor, fall weakness EXAM: LEFT KNEE - COMPLETE 4+ VIEW COMPARISON:  None. FINDINGS: No acute displaced fracture or dislocation. Mild narrowing of the medial and lateral joint space compartments with bony spurring. Superior and inferior patellar osteophytes. Vascular calcifications. IMPRESSION: No acute osseous abnormality Electronically Signed   By: Donavan Foil M.D.   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  Stereotactic radiosurgery carries a high likelihood for local tumor control at the targeted sites with lower associated risk for neurocognitive changes such as memory loss. However, the use of stereotactic radiosurgery in this  setting may leave the patient at increased risk for new brain metastases elsewhere in the brain as high as 50-60%. Accordingly, patients who receive stereotactic radiosurgery in this setting should undergo ongoing surveillance imaging with brain MRI more frequently in order to identify and treat new small brain metastases before they become symptomatic. Stereotactic radiosurgery does carry some different risks, including a risk of radionecrosis.  PLAN: Today, I reviewed the findings and workup thus far with the patient. We discussed the dilemma regarding stereotactic radiosurgery. We discussed the pros and cons and discussed the logistics and delivery. We reviewed the results associated with SRS. The patient seems to understand the treatment option and would like to proceed with stereotactic radiosurgery.  The patient is scheduled to see Dr. Lindi Adie tomorrow morning. SRS CT simulation is scheduled on 08/11/16 at 11AM and an MRI of the brain on 08/11/16 at 1:10PM. The patient is expected to begin [facility] treatment on 08/13/16 at [place].  The patient is in rehab at [facility] and her records are being requested at this time. The patient's caretaker/friend is Karis Juba and could be reached at [phone]. The patient signed a Designated Party Release to this individual.  I spent 60 minutes minutes face to face with the patient and more than 50% of that time was spent in counseling and/or coordination of care.   ------------------------------------------------   Tyler Pita, MD [place] Director and Director of Stereotactic Radiosurgery Direct Dial: [phone]  Fax: [phone] [email or web address]  Skype  [email or web address]  This document serves as a record of services personally performed by Tyler Pita, MD. It was created on his behalf by Darcus Austin, a trained medical scribe. The creation of this record is based on the scribe's personal observations and the provider's  statements to them. This document has been checked and approved by the attending provider.

## 2016-07-27 NOTE — Progress Notes (Signed)
See progress note under physician encounter. 

## 2016-07-27 NOTE — Telephone Encounter (Signed)
Phoned [name], caregiver. Explained [facility] brought patient for her consult but, they did arrive late. [name] understands this RN will be in touch with her tomorrow to explain the discussion had during consult.

## 2016-07-28 ENCOUNTER — Ambulatory Visit (HOSPITAL_BASED_OUTPATIENT_CLINIC_OR_DEPARTMENT_OTHER): Payer: Medicare Other | Admitting: Hematology and Oncology

## 2016-07-28 ENCOUNTER — Encounter: Payer: Self-pay | Admitting: Hematology and Oncology

## 2016-07-28 VITALS — BP 128/62 | HR 62 | Temp 98.3°F | Resp 18 | Ht 63.0 in

## 2016-07-28 DIAGNOSIS — C7931 Secondary malignant neoplasm of brain: Secondary | ICD-10-CM

## 2016-07-28 DIAGNOSIS — C50912 Malignant neoplasm of unspecified site of left female breast: Secondary | ICD-10-CM

## 2016-07-28 DIAGNOSIS — C50412 Malignant neoplasm of upper-outer quadrant of left female breast: Secondary | ICD-10-CM | POA: Diagnosis not present

## 2016-07-28 DIAGNOSIS — Z170 Estrogen receptor positive status [ER+]: Principal | ICD-10-CM

## 2016-07-28 DIAGNOSIS — M8580 Other specified disorders of bone density and structure, unspecified site: Secondary | ICD-10-CM

## 2016-07-28 NOTE — Assessment & Plan Note (Signed)
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, no lymph nodes, grade 2-3, ER 5% PR 0% HER-2 positive ratio 6.96, Ki-67 is 80% T3 N0 M0 stage IIB clinical stage  Treatment summary: TCH-Perjeta 6 cycles started 12/31/2014 completed 04/22/2015 (decreased chemotherapy dosage with cycle 2 and discontinued Perjeta for diarrhea) Breast MRI 04/29/2015: Significant interval decrease in size of the mass, continued skin retraction, 4.3 x 2 x 1.8 cm (previously 4.9 x 3.9 x 3.9 cm) Left mastectomy 05/30/2015: CR to chemo, 0/6 LN  Did not need radiation based upon tumor board recommendation Herceptin maintenance completed May 2017 Echocardiogram 07/18/2015: EF 60-65% normal Brain metastases diagnosed November 2017  Treatment plan:  1. Stereotactic radiosurgery being planned by Dr. Tammi Klippel 2. followed by lapatinib with anastrozole I discussed with the patient the risks and benefits of lapatinib. We discussed the diarrhea as a side effect of lapatinib.  Osteopenia: Bone density done 07/14/2015: Revealed a T score of -1.6, encouraged her to take calcium and vitamin D  Return to clinic in 3 months for follow-up

## 2016-07-28 NOTE — Addendum Note (Signed)
Encounter addended by: Heywood Footman, RN on: 07/28/2016 12:34 PM<BR>    Actions taken: Sign clinical note, Charge Capture section accepted

## 2016-07-28 NOTE — Progress Notes (Signed)
Patient Care Team: [name], MD as PCP - General  DIAGNOSIS: Metastatic breast cancer  SUMMARY OF ONCOLOGIC HISTORY:   Breast cancer of upper-outer quadrant of left female breast ([place])   12/07/2014 Initial Diagnosis    Left breast 2:00 position: Invasive ductal carcinoma grade 2, ER 5%, PR 0%, Ki-67 80%, HER-2 positive ratio 6.96      12/07/2014 Mammogram    Large left breast mass 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, 2 small benign nodules in the right breast stable since 2002, other consistent with normal intramammary lymph node left axilla ultrasound normal-sized lymph nodes      12/31/2014 - 04/22/2015 Neo-Adjuvant Chemotherapy    TCH Perjeta neoadjuvant chemotherapy 6 (decreased chemotherapy dosage with cycle 2 and discontinued Perjeta for diarrhea)       04/29/2015 Breast MRI    Significant interval decrease in size of the mass, continued skin retraction, 4.3 x 2 x 1.8 cm (previously 4.9 x 3.9 x 3.9 cm)      05/30/2015 Surgery    Left mastectomy: Path CR, 0/6 LN Negative      09/09/2015 -  Anti-estrogen oral therapy    Anastrozole 1 mg daily      07/09/2016 Imaging    Patient presented in the emergency department after a fall at home without help for 24 hours, CT head revealed 3.9 x 3.5 cm cystic mass anterior right temporal lobe with vasogenic edema, MRI revealed 4.9 cm mass      07/09/2016 - 07/16/2016 Hospital Admission    Admitted with brain metastases after a fall at home, rhabdomyolysis       07/13/2016 Surgery    Craniotomy by Dr.Ditty: Metastatic breast cancer ER 40%, PR 0%, HER-2 positive; postcraniotomy MRI showed residual enhancement along the anterior and superior margin, stable 1.3 cm left parietal lobe met.       CHIEF COMPLIANT: Follow-up after recent hospitalization for craniotomy for brain metastases  INTERVAL HISTORY: Savannah Howard is a 70 year old with above-mentioned history of breast cancer but complete response to chemotherapy and was on adjuvant  antiestrogen therapy when she fell at home and lost consciousness and later could not help get help for 24 hours. She was rhabdomyolysis and renal failure when she was admitted to the hospital. MRI of the brain revealed right temporal lobe metastatic breast cancer that was resected partially by neurosurgery. She has seen radiation oncology yesterday for planning for SRS. She is slowly improving and getting physical therapy at rehabilitation [facility].  REVIEW OF SYSTEMS:   Constitutional: Denies fevers, chills or abnormal weight loss Eyes: Denies blurriness of vision Ears, nose, mouth, throat, and face: Denies mucositis or sore throat Respiratory: Denies cough, dyspnea or wheezes Cardiovascular: Denies palpitation, chest discomfort Gastrointestinal:  Denies nausea, heartburn or change in bowel habits Skin: Denies abnormal skin rashes Lymphatics: Denies new lymphadenopathy or easy bruising Neurological:surgical scar appears to be healing well on the scalp, still very weak and requires wheelchair. At the rehabilitation center, she apparently is able to walk with the help of walker. Behavioral/Psych: Mood is stable, no new changes  Extremities: No lower extremity edema  All other systems were reviewed with the patient and are negative.  I have reviewed the past medical history, past surgical history, social history and family history with the patient and they are unchanged from previous note.  ALLERGIES:  is allergic to penicillins and sulfonamide derivatives.  MEDICATIONS:  Current Outpatient Prescriptions  Medication Sig Dispense Refill  . acetaminophen (TYLENOL)  500 MG tablet Take 1,000 mg by mouth at bedtime.     Marland Kitchen anastrozole (ARIMIDEX) 1 MG tablet Take 1 mg by mouth daily.    . cyanocobalamin (,VITAMIN B-12,) 1000 MCG/ML injection Inject 1,000 mcg into the muscle every 30 (thirty) days.    Marland Kitchen dexamethasone (DECADRON) 2 MG tablet Take 2 mg by mouth every 6 (six) hours.    . docusate  sodium (COLACE) 100 MG capsule Take 1 capsule (100 mg total) by mouth 2 (two) times daily. 10 capsule 0  . levETIRAcetam (KEPPRA) 750 MG tablet Take 1 tablet (750 mg total) by mouth 2 (two) times daily.    Marland Kitchen LORazepam (ATIVAN) 0.5 MG tablet Take 0.5 mg by mouth 2 (two) times daily.    Marland Kitchen losartan-hydrochlorothiazide (HYZAAR) 100-25 MG tablet Take 1 tablet by mouth daily.    . metoprolol succinate (TOPROL-XL) 25 MG 24 hr tablet Take 25 mg by mouth daily.    Marland Kitchen omeprazole (PRILOSEC) 20 MG capsule Take 20 mg by mouth daily.    Marland Kitchen oxyCODONE-acetaminophen (PERCOCET/ROXICET) 5-325 MG tablet Take 1 tablet by mouth every 6 (six) hours as needed.    . potassium chloride (MICRO-K) 10 MEQ CR capsule Take 10 mEq by mouth daily.    Marland Kitchen terazosin (HYTRIN) 5 MG capsule Take 5 mg by mouth at bedtime. Do not crush    . vitamin B-12 (CYANOCOBALAMIN) 1000 MCG tablet Take 1,000 mcg by mouth daily.     No current facility-administered medications for this visit.     PHYSICAL EXAMINATION: ECOG PERFORMANCE STATUS: 1 - Symptomatic but completely ambulatory  Vitals:   07/28/16 1027  BP: 128/62  Pulse: 62  Resp: 18  Temp: 98.3 F (36.8 C)   Filed Weights    GENERAL:alert, no distress and comfortable SKIN: skin color, texture, turgor are normal, no rashes or significant lesions EYES: normal, Conjunctiva are pink and non-injected, sclera clear OROPHARYNX:no exudate, no erythema and lips, buccal mucosa, and tongue normal  NECK: supple, thyroid normal size, non-tender, without nodularity LYMPH:  no palpable lymphadenopathy in the cervical, axillary or inguinal LUNGS: clear to auscultation and percussion with normal breathing effort HEART: regular rate & rhythm and no murmurs and no lower extremity edema ABDOMEN:abdomen soft, non-tender and normal bowel sounds MUSCULOSKELETAL:no cyanosis of digits and no clubbing  NEURO: alert & oriented x 3 with fluent speech, generalized weakness EXTREMITIES: No lower extremity  edema  LABORATORY DATA:  I have reviewed the data as listed   Chemistry      Component Value Date/Time   NA 136 (A) 07/20/2016   NA 141 10/07/2015 0843   K 3.4 07/20/2016   K 3.4 (L) 10/07/2015 0843   CL 102 07/14/2016 0530   CO2 24 07/14/2016 0530   CO2 22 10/07/2015 0843   BUN 46 (A) 07/20/2016   BUN 24.0 10/07/2015 0843   CREATININE 1.2 (A) 07/20/2016   CREATININE 0.99 07/14/2016 0530   CREATININE 1.2 (H) 10/07/2015 0843   GLU 115 07/20/2016      Component Value Date/Time   CALCIUM 8.1 (L) 07/14/2016 0530   CALCIUM 10.2 10/07/2015 0843   ALKPHOS 68 07/09/2016 2339   ALKPHOS 76 10/07/2015 0843   AST 71 (H) 07/09/2016 2339   AST 13 10/07/2015 0843   ALT 22 07/09/2016 2339   ALT 13 10/07/2015 0843   BILITOT 0.9 07/09/2016 2339   BILITOT 0.35 10/07/2015 0843       Lab Results  Component Value Date   WBC 14.1 07/20/2016  HGB 10.9 (A) 07/20/2016   HCT 33 (A) 07/20/2016   MCV 94.7 07/14/2016   PLT 264 07/20/2016   NEUTROABS 13.5 (H) 07/09/2016    ASSESSMENT & PLAN:  Breast cancer of upper-outer quadrant of left female breast ([place]) 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, no lymph nodes, grade 2-3, ER 5% PR 0% HER-2 positive ratio 6.96, Ki-67 is 80% T3 N0 M0 stage IIB clinical stage  Treatment summary: TCH-Perjeta 6 cycles started 12/31/2014 completed 04/22/2015 (decreased chemotherapy dosage with cycle 2 and discontinued Perjeta for diarrhea) Breast MRI 04/29/2015: Significant interval decrease in size of the mass, continued skin retraction, 4.3 x 2 x 1.8 cm (previously 4.9 x 3.9 x 3.9 cm) Left mastectomy 05/30/2015: CR to chemo, 0/6 LN  Did not need radiation based upon tumor board recommendation Herceptin maintenance completed May 2017 Echocardiogram 07/18/2015: EF 60-65% normal Brain metastases diagnosed November 2017  Treatment plan:  1. Stereotactic radiosurgery being planned by Dr. Tammi Klippel 2. followed by lapatinib  with anastrozole I discussed with the patient the risks and benefits of lapatinib. We discussed the diarrhea as a side effect of lapatinib. We may start lapatinib at 500 mg (2 tabs) daily for a week and then 750 mg daily for another week and then 1000 mg daily subsequently.  Osteopenia: Bone density done 07/14/2015: Revealed a T score of -1.6, encouraged her to take calcium and vitamin D  Return to clinic in 6 weeks for follow-up to begin lapatinib therapy   No orders of the defined types were placed in this encounter.  The patient has a good understanding of the overall plan. she agrees with it. she will call with any problems that may develop before the next visit here.   Rulon Eisenmenger, MD 07/28/16

## 2016-07-30 NOTE — Progress Notes (Signed)
Medication reconciliation due to receiving updated medication sheets via fax from [facility].

## 2016-07-30 NOTE — Addendum Note (Signed)
Encounter addended by: Jenene Slicker, RN on: 07/30/2016  2:30 PM<BR>    Actions taken: Order Reconciliation Section accessed, Home Medications modified, Sign clinical note

## 2016-08-11 ENCOUNTER — Ambulatory Visit
Admission: RE | Admit: 2016-08-11 | Discharge: 2016-08-11 | Disposition: A | Discharge status: Home / Self Care | Payer: Medicare Other | Source: Ambulatory Visit | Attending: Radiation Oncology | Admitting: Radiation Oncology

## 2016-08-11 ENCOUNTER — Other Ambulatory Visit: Payer: Self-pay | Admitting: Radiation Therapy

## 2016-08-11 DIAGNOSIS — C7949 Secondary malignant neoplasm of other parts of nervous system: Principal | ICD-10-CM

## 2016-08-11 DIAGNOSIS — Z170 Estrogen receptor positive status [ER+]: Secondary | ICD-10-CM | POA: Diagnosis not present

## 2016-08-11 DIAGNOSIS — F419 Anxiety disorder, unspecified: Secondary | ICD-10-CM | POA: Diagnosis not present

## 2016-08-11 DIAGNOSIS — D496 Neoplasm of unspecified behavior of brain: Secondary | ICD-10-CM

## 2016-08-11 DIAGNOSIS — C7931 Secondary malignant neoplasm of brain: Secondary | ICD-10-CM

## 2016-08-11 DIAGNOSIS — M5136 Other intervertebral disc degeneration, lumbar region: Secondary | ICD-10-CM | POA: Diagnosis not present

## 2016-08-11 DIAGNOSIS — C50412 Malignant neoplasm of upper-outer quadrant of left female breast: Secondary | ICD-10-CM | POA: Diagnosis not present

## 2016-08-11 DIAGNOSIS — Z510 Encounter for antineoplastic radiation therapy: Secondary | ICD-10-CM | POA: Diagnosis not present

## 2016-08-11 DIAGNOSIS — C50912 Malignant neoplasm of unspecified site of left female breast: Secondary | ICD-10-CM

## 2016-08-11 DIAGNOSIS — C50919 Malignant neoplasm of unspecified site of unspecified female breast: Secondary | ICD-10-CM | POA: Diagnosis not present

## 2016-08-11 MED ORDER — SODIUM CHLORIDE 0.9% FLUSH
10.0000 mL | Freq: Once | INTRAVENOUS | Status: AC
  Administered 2016-08-11: 10 mL via INTRAVENOUS

## 2016-08-11 MED ORDER — HEPARIN SOD (PORK) LOCK FLUSH 100 UNIT/ML IV SOLN
500.0000 [IU] | Freq: Once | INTRAVENOUS | Status: AC
  Administered 2016-08-11: 500 [IU] via INTRAVENOUS

## 2016-08-11 MED ORDER — GADOBENATE DIMEGLUMINE 529 MG/ML IV SOLN
9.0000 mL | Freq: Once | INTRAVENOUS | Status: AC | PRN
  Administered 2016-08-11: 9 mL via INTRAVENOUS

## 2016-08-11 NOTE — Progress Notes (Signed)
Does patient have an allergy to IV contrast dye?: No   Has patient ever received premedication for IV contrast dye?: No  Does patient take metformin?: No  If patient does take metformin when was the last dose: N/A  Date of lab work: 07/14/16 BUN: 46 CR: 1.2  IV site: Accessed right subclavian power port on first attempt. Site flushed without difficulty. Received excellent blood return. Secured huber needle in place per protocol. Patient tolerated well.   Following SIM patient to present to [facility] with port still accessed. Patient understands to return to radiation oncology following MRI for port access removal.  Patient returned following MRI at 1436. Flushed site per protocol and removed access needle. Needle intact upon removal. Applied an occlusive dressing to site. Patient tolerated all well. Patient understands to force fluids today. Also, patient understands to return on 08/13/2016 at 1430 for labs (reaccess elevated BUN and creatine) and treatment.

## 2016-08-11 NOTE — Progress Notes (Signed)
  Radiation Oncology         [phone] ________________________________  Name: NEERA TENG MRN: [id number]  Date: 08/11/2016  DOB: 06-30-1946  SIMULATION AND TREATMENT PLANNING NOTE    ICD-9-CM ICD-10-CM   1. 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 Brain tumor ([place]) 239.6 D49.6 sodium chloride flush (NS) 0.9 % injection 10 mL     heparin lock flush 100 unit/mL    DIAGNOSIS:  71 y.o. woman status post resection of a 4.9 cm right temporal brain metastasis from upper outer metastatic left breast cancer (ER positive, PR negative, HER2 positive) with a 1.3 cm left parietal metastasis  NARRATIVE:  The patient was brought to the [facility].  Identity was confirmed.  All relevant records and images related to the planned course of therapy were reviewed.  The patient freely provided informed written consent to proceed with treatment after reviewing the details related to the planned course of therapy. The consent form was witnessed and verified by the simulation staff. Intravenous access was established for contrast administration. Then, the patient was set-up in a stable reproducible supine position for radiation therapy.  A relocatable thermoplastic stereotactic head frame was fabricated for precise immobilization.  CT images were obtained.  Surface markings were placed.  The CT images were loaded into the planning software and fused with the patient's targeting MRI scan.  Then the target and avoidance structures were contoured.  Treatment planning then occurred.  The radiation prescription was entered and confirmed.  I have requested 3D planning  I have requested a DVH of the following structures: Brain stem, brain, left eye, right eye, lenses, optic chiasm, target volumes, uninvolved brain, and normal tissue.    SPECIAL TREATMENT PROCEDURE:  The planned course of therapy using radiation constitutes a special treatment  procedure. Special care is required in the management of this patient for the following reasons. This treatment constitutes a Special Treatment Procedure for the following reason: High dose per fraction requiring special monitoring for increased toxicities of treatment including daily imaging.  The special nature of the planned course of radiotherapy will require increased physician supervision and oversight to ensure patient's safety with optimal treatment outcomes.  PLAN:  The patient will receive 25 Gy in 5 fractions to the resection cavity and 20 Gy in one fraction to the parietal lesion.  ________________________________  Sheral Apley Tammi Klippel, M.D.

## 2016-08-12 DIAGNOSIS — M5136 Other intervertebral disc degeneration, lumbar region: Secondary | ICD-10-CM | POA: Diagnosis not present

## 2016-08-12 DIAGNOSIS — C7931 Secondary malignant neoplasm of brain: Secondary | ICD-10-CM | POA: Diagnosis not present

## 2016-08-12 DIAGNOSIS — C50412 Malignant neoplasm of upper-outer quadrant of left female breast: Secondary | ICD-10-CM | POA: Diagnosis not present

## 2016-08-12 DIAGNOSIS — F419 Anxiety disorder, unspecified: Secondary | ICD-10-CM | POA: Diagnosis not present

## 2016-08-12 DIAGNOSIS — Z510 Encounter for antineoplastic radiation therapy: Secondary | ICD-10-CM | POA: Diagnosis not present

## 2016-08-12 DIAGNOSIS — Z170 Estrogen receptor positive status [ER+]: Secondary | ICD-10-CM | POA: Diagnosis not present

## 2016-08-13 ENCOUNTER — Ambulatory Visit
Admission: RE | Admit: 2016-08-13 | Discharge: 2016-08-13 | Disposition: A | Discharge status: Home / Self Care | Payer: Medicare Other | Source: Ambulatory Visit | Attending: Radiation Oncology | Admitting: Radiation Oncology

## 2016-08-13 VITALS — BP 152/52 | HR 52 | Temp 98.4°F | Resp 18

## 2016-08-13 DIAGNOSIS — C50412 Malignant neoplasm of upper-outer quadrant of left female breast: Secondary | ICD-10-CM | POA: Diagnosis not present

## 2016-08-13 DIAGNOSIS — C7931 Secondary malignant neoplasm of brain: Principal | ICD-10-CM

## 2016-08-13 DIAGNOSIS — Z170 Estrogen receptor positive status [ER+]: Secondary | ICD-10-CM | POA: Diagnosis not present

## 2016-08-13 DIAGNOSIS — C7949 Secondary malignant neoplasm of other parts of nervous system: Secondary | ICD-10-CM | POA: Diagnosis not present

## 2016-08-13 DIAGNOSIS — Z510 Encounter for antineoplastic radiation therapy: Secondary | ICD-10-CM | POA: Diagnosis not present

## 2016-08-13 DIAGNOSIS — C50912 Malignant neoplasm of unspecified site of left female breast: Secondary | ICD-10-CM

## 2016-08-13 DIAGNOSIS — M5136 Other intervertebral disc degeneration, lumbar region: Secondary | ICD-10-CM | POA: Diagnosis not present

## 2016-08-13 DIAGNOSIS — F419 Anxiety disorder, unspecified: Secondary | ICD-10-CM | POA: Diagnosis not present

## 2016-08-13 LAB — BUN AND CREATININE (CC13)
BUN: 44.1 mg/dL — AB (ref 7.0–26.0)
Creatinine: 1 mg/dL (ref 0.6–1.1)
EGFR: 58 mL/min/{1.73_m2} — ABNORMAL LOW (ref >90)

## 2016-08-13 NOTE — Progress Notes (Signed)
  Radiation Oncology         [phone]) [phone] ________________________________  Stereotactic Treatment Procedure Note  Name: Savannah Howard MRN: [id number]  Date: 08/13/2016  DOB: 26-Jan-1946  SPECIAL TREATMENT PROCEDURE    ICD-9-CM ICD-10-CM   1. 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  It showed 3-dimensional radiation distributions overlaid onto the planning CT/MRI image set.  The [facility] for the target structures as well as the organs at risk were reviewed. The documentation of the 3D plan and dosimetry are filed in the radiation oncology EMR.  NARRATIVE:  Savannah Howard was brought to the TrueBeam stereotactic radiation treatment machine and placed supine on the CT couch. The head frame was applied, and the patient was set up for stereotactic radiosurgery.  Neurosurgery was present for the set-up and delivery  SIMULATION VERIFICATION:  In the couch zero-angle position, the patient underwent Exactrac imaging using the Brainlab system with orthogonal KV images.  These were carefully aligned and repeated to confirm treatment position for each of the isocenters.  The Exactrac snap film verification was repeated at each couch angle.  PROCEDURE: Savannah Howard received stereotactic radiosurgery to the following targets: Right temporal 4.9 cm target was treated using 4 Rapid Arc VMAT Beams to a prescription dose of 5 Gy to be repeated for 5 fractions to 25 Gy total.  ExacTrac registration was performed for each couch angle.  The 100% isodose line was prescribed.  6 MV X-rays were delivered in the flattening filter free beam mode. Left parietal 1.3 cm target was treated using 3 Dynamic Conformal Arcs to a prescription dose of 20 Gy.  ExacTrac  registration was performed for each couch angle.  The 100% isodose line was prescribed.  6 MV X-rays were delivered in the flattening filter free beam mode.  STEREOTACTIC TREATMENT MANAGEMENT:  Following delivery, the patient was transported to nursing in stable condition and monitored for possible acute effects.  Vital signs were recorded BP (!) 152/52 (BP Location: Right Arm, Patient Position: Sitting, Cuff Size: Normal)   Pulse (!) 52   Temp 98.4 F (36.9 C) (Oral)   Resp 18   SpO2 100% . The patient tolerated treatment without significant acute effects, and was discharged to home in stable condition.    PLAN: Follow-up in one month.  ________________________________  Sheral Apley. Tammi Klippel, M.D.

## 2016-08-13 NOTE — Progress Notes (Signed)
Vitals stable. Denies pain. Denies headache, dizziness, nausea, vomiting, diplopia or ringing in the ears. MAR from [facility] indicates the patient take Decadron 1 mg every six hours. No indication of thrush noted. Patient understands to return Monday for second treatment.   BP (!) 152/52 (BP Location: Right Arm, Patient Position: Sitting, Cuff Size: Normal)   Pulse (!) 52   Temp 98.4 F (36.9 C) (Oral)   Resp 18   SpO2 100%  Wt Readings from Last 3 Encounters:  08/11/16 190 lb (86.2 kg)  07/27/16 180 lb (81.6 kg)  07/17/16 201 lb 15.1 oz (91.6 kg)

## 2016-08-13 NOTE — Op Note (Signed)
  Name: Savannah Howard  MRN: [id number]  Date: 08/13/2016   DOB: [birth year]  Stereotactic Radiosurgery Operative Note  PRE-OPERATIVE DIAGNOSIS:  Multiple Brain Metastases  POST-OPERATIVE DIAGNOSIS:  Multiple Brain Metastases  PROCEDURE:  Stereotactic Radiosurgery  SURGEON:  Kevan Ny Ilithyia Titzer, MD  NARRATIVE: The patient underwent a radiation treatment planning session in the radiation oncology simulation suite under the care of the radiation oncology physician and physicist.  I participated closely in the radiation treatment planning afterwards. The patient underwent planning CT which was fused to 3T high resolution MRI with 1 mm axial slices.  These images were fused on the planning system.  We contoured the gross target volumes and subsequently expanded this to yield the Planning Target Volume. I actively participated in the planning process.  I helped to define and review the target contours and also the contours of the optic pathway, eyes, brainstem and selected nearby organs at risk.  All the dose constraints for critical structures were reviewed and compared to AAPM Task Group 101.  The prescription dose conformity was reviewed.  I approved the plan electronically.    Accordingly, Savannah Howard was brought to the TrueBeam stereotactic radiation treatment linac and placed in the custom immobilization mask.  The patient was aligned according to the IR fiducial markers with BrainLab Exactrac, then orthogonal x-rays were used in ExacTrac with the 6DOF robotic table and the shifts were made to align the patient  Savannah Howard received stereotactic radiosurgery uneventfully.    Lesions treated:  2   Complex lesions treated:  1 (>3.5 cm, <37mm of optic path, or within the brainstem)   The detailed description of the procedure is recorded in the radiation oncology procedure note.  I was present for the duration of the procedure.  DISPOSITION:  Following delivery, the patient was transported  to nursing in stable condition and monitored for possible acute effects to be discharged to home in stable condition with follow-up in one month.  Kevan Ny Dollye Glasser, MD 08/13/2016 4:10 PM

## 2016-08-17 ENCOUNTER — Ambulatory Visit
Admission: RE | Admit: 2016-08-17 | Discharge: 2016-08-17 | Disposition: A | Discharge status: Home / Self Care | Payer: Medicare Other | Source: Ambulatory Visit | Attending: Radiation Oncology | Admitting: Radiation Oncology

## 2016-08-17 ENCOUNTER — Non-Acute Institutional Stay (SKILLED_NURSING_FACILITY): Payer: Medicare Other | Admitting: Internal Medicine

## 2016-08-17 ENCOUNTER — Encounter: Payer: Self-pay | Admitting: Internal Medicine

## 2016-08-17 VITALS — BP 134/69 | HR 62 | Temp 98.1°F | Resp 18

## 2016-08-17 DIAGNOSIS — C7931 Secondary malignant neoplasm of brain: Principal | ICD-10-CM

## 2016-08-17 DIAGNOSIS — Z170 Estrogen receptor positive status [ER+]: Secondary | ICD-10-CM | POA: Diagnosis not present

## 2016-08-17 DIAGNOSIS — M25562 Pain in left knee: Secondary | ICD-10-CM | POA: Diagnosis not present

## 2016-08-17 DIAGNOSIS — C50412 Malignant neoplasm of upper-outer quadrant of left female breast: Secondary | ICD-10-CM | POA: Diagnosis not present

## 2016-08-17 DIAGNOSIS — C50912 Malignant neoplasm of unspecified site of left female breast: Secondary | ICD-10-CM | POA: Diagnosis not present

## 2016-08-17 DIAGNOSIS — D496 Neoplasm of unspecified behavior of brain: Secondary | ICD-10-CM

## 2016-08-17 DIAGNOSIS — F419 Anxiety disorder, unspecified: Secondary | ICD-10-CM

## 2016-08-17 DIAGNOSIS — T796XXD Traumatic ischemia of muscle, subsequent encounter: Secondary | ICD-10-CM

## 2016-08-17 DIAGNOSIS — M5136 Other intervertebral disc degeneration, lumbar region: Secondary | ICD-10-CM | POA: Diagnosis not present

## 2016-08-17 DIAGNOSIS — I10 Essential (primary) hypertension: Secondary | ICD-10-CM

## 2016-08-17 DIAGNOSIS — N183 Chronic kidney disease, stage 3 unspecified: Secondary | ICD-10-CM

## 2016-08-17 DIAGNOSIS — N170 Acute kidney failure with tubular necrosis: Secondary | ICD-10-CM | POA: Diagnosis not present

## 2016-08-17 DIAGNOSIS — K219 Gastro-esophageal reflux disease without esophagitis: Secondary | ICD-10-CM | POA: Diagnosis not present

## 2016-08-17 DIAGNOSIS — R55 Syncope and collapse: Secondary | ICD-10-CM

## 2016-08-17 DIAGNOSIS — Z510 Encounter for antineoplastic radiation therapy: Secondary | ICD-10-CM | POA: Diagnosis not present

## 2016-08-17 NOTE — Progress Notes (Signed)
Location:  [facility] Room Number: 511 Place of Service:  SNF [phone])  Provider: Merrilee Seashore  MD  PCP: Sonda Primes, MD Patient Care Team: Tresa Garter, MD as PCP - General  Extended Emergency Contact Information Primary Emergency Contact: Moore,Jeannie Address: [address]          [place], [place] Mobile Phone: [phone] Relation: Friend  Code Status: DNR Goals of care:  Advanced Directive information Advanced Directives 08/17/2016  Does Patient Have a Medical Advance Directive? Yes  Type of Estate agent of [place];Out of facility DNR (pink MOST or yellow form)  Does patient want to make changes to medical advance directive? No - Patient declined  Copy of Healthcare Power of Attorney in Chart? No - copy requested  Pre-existing out of facility DNR order (yellow form or pink MOST form) Yellow form placed in chart (order not valid for inpatient use)     Allergies  Allergen Reactions  . Penicillins Other (See Comments)    REACTION: genital swelling and skin peeled ? yeast infection ?  . Sulfonamide Derivatives Other (See Comments)    convulsions     Chief Complaint  Patient presents with  . Discharge Note    HPI:  71 y.o. female  with hx of left Breast cancer status post neoadjuvant chemotherapy and  mastectomy in October 2016, currently on anastrozole-admitted with possible syncope,in which she was down for a while and suffered from rhabdomyolysis.  Further evaluation with CT head/MRI brain showed a cystic mass in the mid superior right temporal lobe. Pt was admitted to [facility] from 11/30-12/7 where she was evaluated by neurosurgery, underwent craniotomy and excision of the brain tumor on 12/4. Post -op pt was placed on decadron and keppra. 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    Past Medical History:  Diagnosis Date  . Allergy    rhinitis  . Anemia   . Anxiety   . Brain cancer (HCC)    breast ca with mets to brain  . Breast cancer (HCC)   . DDD (degenerative disc disease), lumbar dx'd in 1980's  . GERD (gastroesophageal reflux disease)   . Heart murmur   . History of blood transfusion 2016 X 3 (05/30/2015)  . Hypertension   . Kidney stone 1990's X 1   "passed it"  . Obesity   . 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 Vitamin B 12 deficiency   . Vitamin D deficiency     Past Surgical History:  Procedure Laterality Date  . APPLICATION OF CRANIAL NAVIGATION N/A 07/13/2016   Procedure: APPLICATION OF CRANIAL NAVIGATION;  Surgeon: Loura Halt Ditty, MD;  Location: [facility] OR;  Service: Neurosurgery;  Laterality: N/A;  . BREAST BIOPSY Left ~ 12/2014  . CHOLECYSTECTOMY OPEN  ~ 1975  . ESOPHAGEAL DILATION  X 3  . MASTECTOMY COMPLETE / SIMPLE W/ SENTINEL NODE BIOPSY Left 05/30/2015  . MASTECTOMY W/ SENTINEL NODE BIOPSY Left 05/30/2015   Procedure: LEFT MASTECTOMY WITH SENTINEL LYMPH NODE BIOPSY;  Surgeon: Chevis Pretty III, MD;  Location: [facility] OR;  Service: General;  Laterality: Left;  . PORTACATH PLACEMENT Right 12/26/2014   Procedure: INSERTION PORT-A-CATH;  Surgeon: Chevis Pretty III, MD;  Location: [facility];  Service: General;  Laterality: Right;  . PR DURAL GRAFT REPAIR,SPINE DEFECT N/A 07/13/2016   Procedure: Marshia Ly STERIOTACTIC BIOPSY WITH STEALTH;  Surgeon: Kevan Ny Ditty, MD;  Location: [place];  Service: Neurosurgery;  Laterality: N/A;      reports that she has never smoked. She has never used smokeless tobacco. She reports that she drinks alcohol. She reports that she does not use drugs. Social History   Social History  . Marital status: Single    Spouse name: N/A  . Number of children: N/A  . Years of education: N/A   Occupational History  .  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 Smoking status: Never Smoker  . Smokeless tobacco: Never Used  . Alcohol use Yes     Comment: 05/30/2015 "I might take a drink of wine 1-2 times/yr"  . Drug use: No  . Sexual activity: Not Currently   Other Topics Concern  . Not on file   Social History Narrative   Regular exercise- no   Functional Status Survey:    Allergies  Allergen Reactions  . Penicillins Other (See Comments)    REACTION: genital swelling and skin peeled ? yeast infection ?  . Sulfonamide Derivatives Other (See Comments)    convulsions     Pertinent  Health Maintenance Due  Topic Date Due  . COLONOSCOPY  02/04/1995  . PNA vac Low Risk Adult (1 of 2 - PCV13) 02/03/2010  . INFLUENZA VACCINE  03/10/2016  . MAMMOGRAM  05/01/2018  . DEXA SCAN  Completed    Medications: Allergies as of 08/17/2016      Reactions   Penicillins Other (See Comments)   REACTION: genital swelling and skin peeled ? yeast infection ?   Sulfonamide Derivatives Other (See Comments)   convulsions      Medication List       Accurate as of 08/17/16 12:43 PM. Always use your most recent med list.          acetaminophen 500 MG tablet Commonly known as:  TYLENOL Take 1,000 mg by mouth at bedtime.   anastrozole 1 MG tablet Commonly known as:  ARIMIDEX Take 1 mg by mouth daily.   dexamethasone 2 MG tablet Commonly known as:  DECADRON Take 1 mg by mouth every 6 (six) hours as needed.   docusate sodium 100 MG capsule Commonly known as:  COLACE Take 1 capsule (100 mg total) by mouth 2 (two) times daily.   levETIRAcetam 750 MG tablet Commonly known as:  KEPPRA Take 1 tablet (750 mg total) by mouth 2 (two) times daily.   LORazepam 0.5 MG tablet Commonly known as:  ATIVAN Take 0.5 mg by mouth 2 (two) times daily.   losartan-hydrochlorothiazide 100-25 MG tablet Commonly known as:  HYZAAR Take 1 tablet by mouth daily.   metoprolol succinate 25 MG 24 hr  tablet Commonly known as:  TOPROL-XL Take 25 mg by mouth daily.   omeprazole 20 MG capsule Commonly known as:  PRILOSEC Take 20 mg by mouth daily.   potassium chloride 10 MEQ CR capsule Commonly known as:  MICRO-K Take 10 mEq by mouth daily.   terazosin 5 MG capsule Commonly known as:  HYTRIN Take 5 mg by mouth at bedtime. Do not crush   vitamin B-12 1000 MCG tablet Commonly known as:  CYANOCOBALAMIN Take 1,000 mcg by mouth daily.   cyanocobalamin 1000 MCG/ML injection Commonly known as:  (VITAMIN B-12) Inject 1,000 mcg into the muscle every 30 (  thirty) days.       Review of Systems  Vitals:   08/17/16 1121  BP: (!) 147/62  Pulse: (!) 52  Resp: 16  Temp: 97.3 F (36.3 C)  Weight: 190 lb (86.2 kg)  Height: '5\' 2"'$  (1.575 m)   Body mass index is 34.75 kg/m. Physical Exam  Labs reviewed: Basic Metabolic Panel:  Recent Labs  07/11/16 0517 07/13/16 0528 07/14/16 07/14/16 0530 07/20/16 08/13/16 1422  NA 133* 135 136* 136 136*  --   K 3.8 4.0  --  3.7 3.4  --   CL 100* 103  --  102  --   --   CO2 24 24  --  24  --   --   GLUCOSE 145* 132*  --  133*  --   --   BUN 31* 36* 27* 27* 46* 44.1*  CREATININE 1.26* 1.14* 1.0 0.99 1.2* 1.0  CALCIUM 8.3* 8.2*  --  8.1*  --   --    Liver Function Tests:  Recent Labs  09/16/15 0844 10/07/15 0843 07/09/16 2339  AST 11 13 71*  ALT '11 13 22  '$ ALKPHOS 70 76 68  BILITOT 0.39 0.35 0.9  PROT 7.7 7.6 7.6  ALBUMIN 3.8 3.8 4.1   No results for input(s): LIPASE, AMYLASE in the last 8760 hours. No results for input(s): AMMONIA in the last 8760 hours. CBC:  Recent Labs  09/16/15 0844 10/07/15 0843  07/09/16 2339 07/14/16 07/14/16 0530 07/20/16  WBC 7.6 8.1  < > 16.3* 11.4 11.4* 14.1  NEUTROABS 3.9 3.3  --  13.5*  --   --   --   HGB 12.8 12.6  < > 15.3*  --  9.5* 10.9*  HCT 38.1 37.9  < > 44.4  --  28.8* 33*  MCV 98.5 97.1  --  91.5  --  94.7  --   PLT 193 216  < > 222  --  208 264  < > = values in this interval  not displayed. Cardiac Enzymes:  Recent Labs  07/10/16 0420 07/11/16 0625 07/13/16 0528  CKTOTAL 9,810* 3,599* 2,244*  TROPONINI 0.78*  --   --    BNP: Invalid input(s): POCBNP CBG:  Recent Labs  07/13/16 0622 07/14/16 0559 07/16/16 0645  GLUCAP 139* 134* 107*    Procedures and Imaging Studies During Stay: Mr Jeri Cos Wo Contrast  Result Date: 08/11/2016 CLINICAL DATA:  Baseline SRS targeting. Metastatic breast cancer (ER positive, PR negative, HER2 positive), status post RIGHT temporal craniotomy for subtotal resection. EXAM: MRI HEAD WITHOUT AND WITH CONTRAST TECHNIQUE: Multiplanar, multiecho pulse sequences of the brain and surrounding structures were obtained without and with intravenous contrast. CONTRAST:  49m MULTIHANCE GADOBENATE DIMEGLUMINE 529 MG/ML IV SOLN COMPARISON:  Most recent 07/14/2016. FINDINGS: Brain: Restricted diffusion noted peripheral to the RIGHT temporal tumor resection has normalized. Vasogenic edema extends deep to the periventricular white matter, spreading posteriorly along the optic radiation. Expected degree of chronic blood products in the surgical bed. Interval growth of RIGHT temporal lobe tumor since the subtotal resection on 07/13/2016, with cross-sectional measurements of 24 x 32 x 22 mm (R-L x A-P x C-C). No significant mass effect or midline shift. While it is possible that some of the inferior enhancement of this lesion could represent postsurgical change, the nodular appearance of the more posterior, medial, and inferior component of the enhancement (axial image 72, coronal image 20, and sagittal image 8) suggests residual tumor surrounds the resection cavity. In addition,  the enhancing component involving much of the superior temporal gyrus displays increased wall thickness compared with the previous most recent scan. Enhancing superficially located intra-axial LEFT parietal lesion, stable measuring approximately 12 x 10 x 9 mm. Only minimal vasogenic  edema. No significant mass effect. Incidental RIGHT frontal venous angioma. 5 mm thick epidural fluid collection under the craniotomy flap, noncompressive. Normal for age cerebral volume. Mild to moderate T2 and FLAIR hyperintensities throughout the white matter more focal than confluent, consistent with chronic microvascular ischemic change. Vascular: Normal flow voids. Skull and upper cervical spine: Normal marrow signal. Extensive hyperostosis. Sinuses/Orbits: Negative. Other: None. IMPRESSION: Interval progression of RIGHT temporal metastasis over the one month interval since subtotal resection. Today's measurements are approximately 24 x 32 x 22 mm. See discussion above there Stable LEFT parietal superficial intra-axial metastasis, roughly 1 cm in size. Stable small vessel disease. Electronically Signed   By: Staci Righter M.D.   On: 08/11/2016 14:23    Assessment/Plan:   There are no diagnoses linked to this encounter.   Patient is being discharged with the following home health services:  HH/Ot/PT/Nursing  Patient is being discharged with the following durable medical equipment:  Rolling walker  Patient has been advised to f/u with their PCP in 1-2 weeks to for a transitions of care visit.  Social services at their facility was responsible for arranging this appointment.  Pt was provided with adequate prescriptions of noncontrolled medications to reach the scheduled appointment .  For controlled substances, a limited supply was provided as appropriate for the individual patient.  If the pt normally receives these medications from a pain clinic or has a contract with another physician, these medications should be received from that clinic or physician only).    Time spent > 30 min;> 50% of time with patient was spent reviewing records, labs, tests and studies, counseling and developing plan of care  Hennie Duos MD

## 2016-08-17 NOTE — Progress Notes (Signed)
Nurse monitoring complete following SRS treatment. Vitals stable. Denies headache, dizziness, nausea, diplopia or ringing in the ears. Patient without complaints. Reports she felt no side effects s/p her initial SRS treatment. MAR from [facility] indicates the patient continues to take Decadron 1 mg every six hours. No evidence of thrush noted. Patient understands to return for third treatment on Wednesday. Also, patient understands to call [phone] with needs. Patient reports they plan to discharge her home from [facility].  BP 134/69 (BP Location: Right Arm, Patient Position: Sitting, Cuff Size: Normal)   Pulse 62   Temp 98.1 F (36.7 C) (Oral)   Resp 18   SpO2 100%

## 2016-08-17 NOTE — Progress Notes (Signed)
  Radiation Oncology         [phone]) [phone] ________________________________  Stereotactic Treatment Procedure Note  Name: Savannah Howard MRN: [id number]  Date: 08/17/2016  DOB: [birth year]  SPECIAL TREATMENT PROCEDURE    ICD-9-CM ICD-10-CM   1. 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  It showed 3-dimensional radiation distributions overlaid onto the planning CT/MRI image set.  The [facility] for the target structures as well as the organs at risk were reviewed. The documentation of the 3D plan and dosimetry are filed in the radiation oncology EMR.  NARRATIVE:  Savannah Howard was brought to the TrueBeam stereotactic radiation treatment machine and placed supine on the CT couch. The head frame was applied, and the patient was set up for stereotactic radiosurgery.  Neurosurgery was present for the set-up and delivery  SIMULATION VERIFICATION:  In the couch zero-angle position, the patient underwent Exactrac imaging using the Brainlab system with orthogonal KV images.  These were carefully aligned and repeated to confirm treatment position for each of the isocenters.  The Exactrac snap film verification was repeated at each couch angle.  PROCEDURE: [name] received stereotactic radiosurgery to the following targets: Right temporal 4.9 cm target was treated using 4 Rapid Arc VMAT Beams to a prescription dose of 5 Gy to be repeated for 5 fractions to 25 Gy total.  ExacTrac registration was performed for each couch angle.  The 100% isodose line was prescribed.  6 MV X-rays were delivered in the flattening filter free beam mode.  STEREOTACTIC TREATMENT MANAGEMENT:  Following delivery, the patient was transported to nursing in stable condition and  monitored for possible acute effects.  Vital signs were recorded BP 134/69 (BP Location: Right Arm, Patient Position: Sitting, Cuff Size: Normal)   Pulse 62   Temp 98.1 F (36.7 C) (Oral)   Resp 18   SpO2 100% . The patient tolerated treatment without significant acute effects, and was discharged to home in stable condition.    PLAN: Follow-up in one month.  ________________________________  Sheral Apley. Tammi Klippel, M.D.

## 2016-08-17 NOTE — Progress Notes (Addendum)
Location:  [facility] Room Number: 511 Place of Service:  SNF (31)  PCP: Sonda Primes, MD Patient Care Team: Tresa Garter, MD as PCP - General  Extended Emergency Contact Information Primary Emergency Contact: Moore,Jeannie Address: [address]          [place], [place] Mobile Phone: [phone] Relation: Friend  Allergies  Allergen Reactions  . Penicillins Other (See Comments)    REACTION: genital swelling and skin peeled ? yeast infection ?  . Sulfonamide Derivatives Other (See Comments)    convulsions     Chief Complaint  Patient presents with  . Discharge Note    HPI:  71 y.o. female  with hx of left Breast cancer status post neoadjuvant chemotherapy and  mastectomy in October 2016, currently on anastrozole-admitted with possible syncope,in which she was down for a while and suffered from rhabdomyolysis.  Further evaluation with CT head/MRI brain showed a cystic mass in the mid superior right temporal lobe. Pt was admitted to [facility] from 11/30-12/7 where she was evaluated by neurosurgery, underwent craniotomy and excision of the brain tumor on 12/4. Post -op pt was placed on decadron and keppra. Hospital course was complicated by acute on CKD 2/2 to the rhabdomyolysis.Pt is admiited to SNF with generalized weakness for OT/PT and for supportive care. Pt is being d/c to [facility].   Past Medical History:  Diagnosis Date  . Allergy    rhinitis  . Anemia   . Anxiety   . Brain cancer (HCC)    breast ca with mets to brain  . Breast cancer (HCC)   . DDD (degenerative disc disease), lumbar dx'd in 1980's  . GERD (gastroesophageal reflux disease)   . Heart murmur   . History of blood transfusion 2016 X 3 (05/30/2015)  . Hypertension   . Kidney stone 1990's X 1   "passed it"  . Obesity   . 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 Vitamin B 12 deficiency   . Vitamin D  deficiency     Past Surgical History:  Procedure Laterality Date  . APPLICATION OF CRANIAL NAVIGATION N/A 07/13/2016   Procedure: APPLICATION OF CRANIAL NAVIGATION;  Surgeon: Loura Halt Ditty, MD;  [facility];  Service: Neurosurgery;  Laterality: N/A;  . BREAST BIOPSY Left ~ 12/2014  . CHOLECYSTECTOMY OPEN  ~ 1975  . ESOPHAGEAL DILATION  X 3  . MASTECTOMY COMPLETE / SIMPLE W/ SENTINEL NODE BIOPSY Left 05/30/2015  . MASTECTOMY W/ SENTINEL NODE BIOPSY Left 05/30/2015   Procedure: LEFT MASTECTOMY WITH SENTINEL LYMPH NODE BIOPSY;  Surgeon: Chevis Pretty III, MD;  Location: [facility] OR;  Service: General;  Laterality: Left;  . PORTACATH PLACEMENT Right 12/26/2014   Procedure: INSERTION PORT-A-CATH;  Surgeon: Chevis Pretty III, MD;  Location: [facility];  Service: General;  Laterality: Right;  . PR DURAL GRAFT REPAIR,SPINE DEFECT N/A 07/13/2016   Procedure: Mahala Menghini STERIOTACTIC BIOPSY WITH STEALTH;  Surgeon: Loura Halt Ditty, MD;  Location: [facility] OR;  Service: Neurosurgery;  Laterality: N/A;     reports that she has never smoked. She has never used smokeless tobacco. She reports that she drinks alcohol. She reports that she does not use drugs. Social History   Social History  . Marital status: Single    Spouse name: N/A  . Number of children: N/A  . Years of education: N/A   Occupational History  .  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 Smoking status: Never Smoker  . Smokeless tobacco: Never Used  . Alcohol use Yes     Comment: 05/30/2015 "I might take a drink of wine 1-2 times/yr"  . Drug use: No  . Sexual activity: Not Currently   Other Topics Concern  . Not on file   Social History Narrative   Regular exercise- no    Pertinent  Health Maintenance Due  Topic Date Due  . COLONOSCOPY  02/04/1995  . PNA vac Low Risk Adult (1 of 2 - PCV13) 02/03/2010  . INFLUENZA VACCINE  03/10/2016  . MAMMOGRAM  05/01/2018  . DEXA SCAN  Completed     Medications: Allergies as of 08/17/2016      Reactions   Penicillins Other (See Comments)   REACTION: genital swelling and skin peeled ? yeast infection ?   Sulfonamide Derivatives Other (See Comments)   convulsions      Medication List       Accurate as of 08/17/16  1:02 PM. Always use your most recent med list.          acetaminophen 500 MG tablet Commonly known as:  TYLENOL Take 1,000 mg by mouth at bedtime.   anastrozole 1 MG tablet Commonly known as:  ARIMIDEX Take 1 mg by mouth daily.   dexamethasone 2 MG tablet Commonly known as:  DECADRON Take 1 mg by mouth 4 (four) times daily.   docusate sodium 100 MG capsule Commonly known as:  COLACE Take 1 capsule (100 mg total) by mouth 2 (two) times daily.   HYDROcodone-acetaminophen 5-325 MG tablet Commonly known as:  NORCO/VICODIN Take 1 tablet by mouth every 6 (six) hours as needed for moderate pain.   levETIRAcetam 750 MG tablet Commonly known as:  KEPPRA Take 1 tablet (750 mg total) by mouth 2 (two) times daily.   LORazepam 0.5 MG tablet Commonly known as:  ATIVAN Take 0.5 mg by mouth 2 (two) times daily.   losartan-hydrochlorothiazide 100-25 MG tablet Commonly known as:  HYZAAR Take 1 tablet by mouth daily.   metoprolol succinate 25 MG 24 hr tablet Commonly known as:  TOPROL-XL Take 25 mg by mouth daily.   omeprazole 20 MG capsule Commonly known as:  PRILOSEC Take 20 mg by mouth daily.   potassium chloride 10 MEQ CR capsule Commonly known as:  MICRO-K Take 10 mEq by mouth daily.   terazosin 5 MG capsule Commonly known as:  HYTRIN Take 5 mg by mouth at bedtime. Do not crush   vitamin B-12 1000 MCG tablet Commonly known as:  CYANOCOBALAMIN Take 1,000 mcg by mouth daily.   cyanocobalamin 1000 MCG/ML injection Commonly known as:  (VITAMIN B-12) Inject 1,000 mcg into the muscle every 30 (thirty) days.        Vitals:   08/17/16 1121  BP: (!) 147/62  Pulse: (!) 52  Resp: 16  Temp: 97.3  F (36.3 C)  Weight: 190 lb (86.2 kg)  Height: $Remove'5\' 2"'hhOpGzk$  (1.575 m)   Body mass index is 34.75 kg/m.  Physical Exam  GENERAL APPEARANCE: Alert, conversant. No acute distress.  HEENT: Unremarkable. RESPIRATORY: Breathing is even, unlabored. Lung sounds are clear   CARDIOVASCULAR: Heart RRR no murmurs, rubs or gallops. No peripheral edema.  GASTROINTESTINAL: Abdomen is soft, non-tender, not distended w/ normal bowel sounds.  NEUROLOGIC: Cranial nerves 2-12 grossly intact. Moves all extremities   Labs reviewed: Basic Metabolic Panel:  Recent Labs  07/11/16 0517 07/13/16  3557 07/14/16 07/14/16 0530 07/20/16 08/13/16 1422  NA 133* 135 136* 136 136*  --   K 3.8 4.0  --  3.7 3.4  --   CL 100* 103  --  102  --   --   CO2 24 24  --  24  --   --   GLUCOSE 145* 132*  --  133*  --   --   BUN 31* 36* 27* 27* 46* 44.1*  CREATININE 1.26* 1.14* 1.0 0.99 1.2* 1.0  CALCIUM 8.3* 8.2*  --  8.1*  --   --    No results found for: [facility] Liver Function Tests:  Recent Labs  09/16/15 0844 10/07/15 0843 07/09/16 2339  AST 11 13 71*  ALT '11 13 22  '$ ALKPHOS 70 76 68  BILITOT 0.39 0.35 0.9  PROT 7.7 7.6 7.6  ALBUMIN 3.8 3.8 4.1   No results for input(s): LIPASE, AMYLASE in the last 8760 hours. No results for input(s): AMMONIA in the last 8760 hours. CBC:  Recent Labs  09/16/15 0844 10/07/15 0843  07/09/16 2339 07/14/16 07/14/16 0530 07/20/16  WBC 7.6 8.1  < > 16.3* 11.4 11.4* 14.1  NEUTROABS 3.9 3.3  --  13.5*  --   --   --   HGB 12.8 12.6  < > 15.3*  --  9.5* 10.9*  HCT 38.1 37.9  < > 44.4  --  28.8* 33*  MCV 98.5 97.1  --  91.5  --  94.7  --   PLT 193 216  < > 222  --  208 264  < > = values in this interval not displayed. Lipid No results for input(s): CHOL, HDL, LDLCALC, TRIG in the last 8760 hours. Cardiac Enzymes:  Recent Labs  07/10/16 0420 07/11/16 0625 07/13/16 0528  CKTOTAL 9,810* 3,599* 2,244*  TROPONINI 0.78*  --   --    BNP:  Recent Labs  07/10/16 0420   BNP 222.2*   CBG:  Recent Labs  07/13/16 0622 07/14/16 0559 07/16/16 0645  GLUCAP 139* 134* 107*    Procedures and Imaging Studies During Stay: Mr Jeri Cos Wo Contrast  Result Date: 08/11/2016 CLINICAL DATA:  Baseline SRS targeting. Metastatic breast cancer (ER positive, PR negative, HER2 positive), status post RIGHT temporal craniotomy for subtotal resection. EXAM: MRI HEAD WITHOUT AND WITH CONTRAST TECHNIQUE: Multiplanar, multiecho pulse sequences of the brain and surrounding structures were obtained without and with intravenous contrast. CONTRAST:  55m MULTIHANCE GADOBENATE DIMEGLUMINE 529 MG/ML IV SOLN COMPARISON:  Most recent 07/14/2016. FINDINGS: Brain: Restricted diffusion noted peripheral to the RIGHT temporal tumor resection has normalized. Vasogenic edema extends deep to the periventricular white matter, spreading posteriorly along the optic radiation. Expected degree of chronic blood products in the surgical bed. Interval growth of RIGHT temporal lobe tumor since the subtotal resection on 07/13/2016, with cross-sectional measurements of 24 x 32 x 22 mm (R-L x A-P x C-C). No significant mass effect or midline shift. While it is possible that some of the inferior enhancement of this lesion could represent postsurgical change, the nodular appearance of the more posterior, medial, and inferior component of the enhancement (axial image 72, coronal image 20, and sagittal image 8) suggests residual tumor surrounds the resection cavity. In addition, the enhancing component involving much of the superior temporal gyrus displays increased wall thickness compared with the previous most recent scan. Enhancing superficially located intra-axial LEFT parietal lesion, stable measuring approximately 12 x 10 x 9 mm. Only minimal vasogenic edema. No  significant mass effect. Incidental RIGHT frontal venous angioma. 5 mm thick epidural fluid collection under the craniotomy flap, noncompressive. Normal for age  cerebral volume. Mild to moderate T2 and FLAIR hyperintensities throughout the white matter more focal than confluent, consistent with chronic microvascular ischemic change. Vascular: Normal flow voids. Skull and upper cervical spine: Normal marrow signal. Extensive hyperostosis. Sinuses/Orbits: Negative. Other: None. IMPRESSION: Interval progression of RIGHT temporal metastasis over the one month interval since subtotal resection. Today's measurements are approximately 24 x 32 x 22 mm. See discussion above there Stable LEFT parietal superficial intra-axial metastasis, roughly 1 cm in size. Stable small vessel disease. Electronically Signed   By: Staci Righter M.D.   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  Social services at facility was responsible for arranging this appointment.  Pt was provided with a 30 day supply of prescriptions for medications and refills must be obtained from their PCP.  For controlled substances, a more limited supply may be provided adequate until PCP appointment only.  Future labs/tests needed:  Time spent > 30 min;> 50% of time with patient was spent reviewing records, labs, tests and studies,  counseling and developing plan of care  Inocencio Homes, MD

## 2016-08-18 ENCOUNTER — Telehealth: Payer: Self-pay | Admitting: Emergency Medicine

## 2016-08-18 NOTE — Telephone Encounter (Signed)
Noted. Thx.

## 2016-08-18 NOTE — Telephone Encounter (Signed)
[facility] called and want to inform you the pts orders were to start today but patient request to wait until [place] 08/20/16 to be seen. Thanks.

## 2016-08-19 ENCOUNTER — Encounter: Payer: Self-pay | Admitting: Radiation Oncology

## 2016-08-19 ENCOUNTER — Ambulatory Visit
Admission: RE | Admit: 2016-08-19 | Discharge: 2016-08-19 | Disposition: A | Discharge status: Home / Self Care | Payer: Medicare Other | Source: Ambulatory Visit | Attending: Radiation Oncology | Admitting: Radiation Oncology

## 2016-08-19 DIAGNOSIS — C50412 Malignant neoplasm of upper-outer quadrant of left female breast: Secondary | ICD-10-CM | POA: Diagnosis not present

## 2016-08-19 DIAGNOSIS — C7931 Secondary malignant neoplasm of brain: Secondary | ICD-10-CM | POA: Diagnosis not present

## 2016-08-19 DIAGNOSIS — Z510 Encounter for antineoplastic radiation therapy: Secondary | ICD-10-CM | POA: Diagnosis not present

## 2016-08-19 DIAGNOSIS — M5136 Other intervertebral disc degeneration, lumbar region: Secondary | ICD-10-CM | POA: Diagnosis not present

## 2016-08-19 DIAGNOSIS — Z170 Estrogen receptor positive status [ER+]: Secondary | ICD-10-CM | POA: Diagnosis not present

## 2016-08-19 DIAGNOSIS — F419 Anxiety disorder, unspecified: Secondary | ICD-10-CM | POA: Diagnosis not present

## 2016-08-19 NOTE — Progress Notes (Signed)
S/p SRS Brain 3/5 completed, patient in w/c room 12 at nursing, denies head ache, nausea, tinnitus, diplopia, no dizzy ness or light headed, offered patient a cola, declined warm blanket,monitor 15 minutes, patient knows to call for any  Unusual symptoms not not normal for her,  profuse vomiting,, high fever, call MD at [phone]; still taking decadron, will pick up rx  Tapering today, is home again, no thrush noted, no pain stated, escorted patient via w/c to friend in rad waiting room  4:36 PM BP 134/62 (BP Location: Right Arm, Patient Position: Sitting, Cuff Size: Large)   Pulse 65   Temp 97.9 F (36.6 C) (Oral)   Resp 20   SpO2 100% Comment: room air

## 2016-08-20 ENCOUNTER — Other Ambulatory Visit: Payer: Self-pay | Admitting: Internal Medicine

## 2016-08-20 ENCOUNTER — Other Ambulatory Visit (HOSPITAL_COMMUNITY): Payer: Self-pay | Admitting: Internal Medicine

## 2016-08-20 ENCOUNTER — Other Ambulatory Visit: Payer: Self-pay | Admitting: Hematology and Oncology

## 2016-08-20 DIAGNOSIS — E669 Obesity, unspecified: Secondary | ICD-10-CM | POA: Diagnosis not present

## 2016-08-20 DIAGNOSIS — M519 Unspecified thoracic, thoracolumbar and lumbosacral intervertebral disc disorder: Secondary | ICD-10-CM | POA: Diagnosis not present

## 2016-08-20 DIAGNOSIS — N183 Chronic kidney disease, stage 3 (moderate): Secondary | ICD-10-CM | POA: Diagnosis not present

## 2016-08-20 DIAGNOSIS — K219 Gastro-esophageal reflux disease without esophagitis: Secondary | ICD-10-CM | POA: Diagnosis not present

## 2016-08-20 DIAGNOSIS — C7981 Secondary malignant neoplasm of breast: Secondary | ICD-10-CM | POA: Diagnosis not present

## 2016-08-20 DIAGNOSIS — F419 Anxiety disorder, unspecified: Secondary | ICD-10-CM | POA: Diagnosis not present

## 2016-08-20 DIAGNOSIS — D649 Anemia, unspecified: Secondary | ICD-10-CM | POA: Diagnosis not present

## 2016-08-20 DIAGNOSIS — Z853 Personal history of malignant neoplasm of breast: Secondary | ICD-10-CM | POA: Diagnosis not present

## 2016-08-20 DIAGNOSIS — Z9181 History of falling: Secondary | ICD-10-CM | POA: Diagnosis not present

## 2016-08-20 DIAGNOSIS — I129 Hypertensive chronic kidney disease with stage 1 through stage 4 chronic kidney disease, or unspecified chronic kidney disease: Secondary | ICD-10-CM | POA: Diagnosis not present

## 2016-08-20 DIAGNOSIS — Z483 Aftercare following surgery for neoplasm: Secondary | ICD-10-CM | POA: Diagnosis not present

## 2016-08-20 DIAGNOSIS — C50412 Malignant neoplasm of upper-outer quadrant of left female breast: Secondary | ICD-10-CM

## 2016-08-21 ENCOUNTER — Telehealth: Payer: Self-pay | Admitting: Internal Medicine

## 2016-08-21 ENCOUNTER — Ambulatory Visit
Admission: RE | Admit: 2016-08-21 | Discharge: 2016-08-21 | Disposition: A | Discharge status: Home / Self Care | Payer: Medicare Other | Source: Ambulatory Visit | Attending: Radiation Oncology | Admitting: Radiation Oncology

## 2016-08-21 VITALS — BP 124/68 | HR 60 | Temp 98.6°F | Resp 18

## 2016-08-21 DIAGNOSIS — F419 Anxiety disorder, unspecified: Secondary | ICD-10-CM | POA: Diagnosis not present

## 2016-08-21 DIAGNOSIS — Z510 Encounter for antineoplastic radiation therapy: Secondary | ICD-10-CM | POA: Diagnosis not present

## 2016-08-21 DIAGNOSIS — N183 Chronic kidney disease, stage 3 (moderate): Secondary | ICD-10-CM | POA: Diagnosis not present

## 2016-08-21 DIAGNOSIS — I129 Hypertensive chronic kidney disease with stage 1 through stage 4 chronic kidney disease, or unspecified chronic kidney disease: Secondary | ICD-10-CM | POA: Diagnosis not present

## 2016-08-21 DIAGNOSIS — C7949 Secondary malignant neoplasm of other parts of nervous system: Principal | ICD-10-CM

## 2016-08-21 DIAGNOSIS — Z170 Estrogen receptor positive status [ER+]: Secondary | ICD-10-CM | POA: Diagnosis not present

## 2016-08-21 DIAGNOSIS — C50912 Malignant neoplasm of unspecified site of left female breast: Secondary | ICD-10-CM

## 2016-08-21 DIAGNOSIS — M519 Unspecified thoracic, thoracolumbar and lumbosacral intervertebral disc disorder: Secondary | ICD-10-CM | POA: Diagnosis not present

## 2016-08-21 DIAGNOSIS — C7981 Secondary malignant neoplasm of breast: Secondary | ICD-10-CM | POA: Diagnosis not present

## 2016-08-21 DIAGNOSIS — C7931 Secondary malignant neoplasm of brain: Secondary | ICD-10-CM

## 2016-08-21 DIAGNOSIS — C50412 Malignant neoplasm of upper-outer quadrant of left female breast: Secondary | ICD-10-CM | POA: Diagnosis not present

## 2016-08-21 DIAGNOSIS — Z483 Aftercare following surgery for neoplasm: Secondary | ICD-10-CM | POA: Diagnosis not present

## 2016-08-21 DIAGNOSIS — D649 Anemia, unspecified: Secondary | ICD-10-CM | POA: Diagnosis not present

## 2016-08-21 DIAGNOSIS — M5136 Other intervertebral disc degeneration, lumbar region: Secondary | ICD-10-CM | POA: Diagnosis not present

## 2016-08-21 NOTE — Telephone Encounter (Signed)
Requesting orders for PT for twice a week for four weeks.

## 2016-08-21 NOTE — Telephone Encounter (Signed)
OK. Thx

## 2016-08-21 NOTE — Progress Notes (Signed)
  Radiation Oncology         [phone] ________________________________  Stereotactic Treatment Procedure Note  Name: Savannah Howard MRN: [id number]  Date: 08/21/2016  DOB: 04/11/1946  SPECIAL TREATMENT PROCEDURE    ICD-9-CM ICD-10-CM   1. Secondary malignant neoplasm of brain and spinal cord (HCC) 198.3 C79.31     C79.49   2. Malignant neoplasm of left breast metastatic to brain (HCC) 174.9 C50.912    198.3 C79.31    CURRENT FRACTION:    4  PLANNED FRACTIONS:  5  3D TREATMENT PLANNING AND DOSIMETRY:  The patient's radiation plan was reviewed and approved by neurosurgery and radiation oncology prior to treatment.  It showed 3-dimensional radiation distributions overlaid onto the planning CT/MRI image set.  The [facility] for the target structures as well as the organs at risk were reviewed. The documentation of the 3D plan and dosimetry are filed in the radiation oncology EMR.  NARRATIVE:  Savannah Howard was brought to the TrueBeam stereotactic radiation treatment machine and placed supine on the CT couch. The head frame was applied, and the patient was set up for stereotactic radiosurgery.  Neurosurgery was present for the set-up and delivery  SIMULATION VERIFICATION:  In the couch zero-angle position, the patient underwent Exactrac imaging using the Brainlab system with orthogonal KV images.  These were carefully aligned and repeated to confirm treatment position for each of the isocenters.  The Exactrac snap film verification was repeated at each couch angle.  PROCEDURE: Savannah Howard received stereotactic radiosurgery to the following targets: Right temporal 4.9 cm target was treated using 4 Rapid Arc VMAT Beams to a prescription dose of 5 Gy to be repeated for 5 fractions to 25 Gy total.  ExacTrac registration was performed for each couch angle.  The 100% isodose line was prescribed.  6 MV X-rays were delivered in the flattening filter free beam mode.  STEREOTACTIC TREATMENT  MANAGEMENT:  Following delivery, the patient was transported to nursing in stable condition and monitored for possible acute effects.  Vital signs were recorded BP 124/68 (BP Location: Right Arm, Patient Position: Sitting, Cuff Size: Large)   Pulse 60   Temp 98.6 F (37 C) (Oral)   Resp 18   SpO2 100% . The patient tolerated treatment without significant acute effects, and was discharged to home in stable condition.    PLAN: Follow-up in one month.  ________________________________  Sheral Apley. Tammi Klippel, M.D.

## 2016-08-21 NOTE — Progress Notes (Signed)
Nurse monitoring complete following SRS treatment. Vitals stable. Denies headache, dizziness, nausea, diplopia or ringing in the ears. Patient without complaints. Reports she continues to take Decadron 1 mg every six hours. No evidence of thrush noted. Patient understands to return for final treatment on Monday. Also, patient understands to call [phone] with needs. Patient discharged home in no distress with family friend Enid Derry.

## 2016-08-24 ENCOUNTER — Ambulatory Visit
Admission: RE | Admit: 2016-08-24 | Discharge: 2016-08-24 | Disposition: A | Discharge status: Home / Self Care | Payer: Medicare Other | Source: Ambulatory Visit | Attending: Radiation Oncology | Admitting: Radiation Oncology

## 2016-08-24 ENCOUNTER — Encounter: Payer: Self-pay | Admitting: Radiation Oncology

## 2016-08-24 VITALS — BP 136/52 | HR 52 | Temp 98.5°F | Resp 12

## 2016-08-24 DIAGNOSIS — Z510 Encounter for antineoplastic radiation therapy: Secondary | ICD-10-CM | POA: Diagnosis not present

## 2016-08-24 DIAGNOSIS — C7931 Secondary malignant neoplasm of brain: Secondary | ICD-10-CM | POA: Diagnosis not present

## 2016-08-24 DIAGNOSIS — Z170 Estrogen receptor positive status [ER+]: Secondary | ICD-10-CM | POA: Diagnosis not present

## 2016-08-24 DIAGNOSIS — C50412 Malignant neoplasm of upper-outer quadrant of left female breast: Secondary | ICD-10-CM | POA: Diagnosis not present

## 2016-08-24 DIAGNOSIS — F419 Anxiety disorder, unspecified: Secondary | ICD-10-CM | POA: Diagnosis not present

## 2016-08-24 DIAGNOSIS — M5136 Other intervertebral disc degeneration, lumbar region: Secondary | ICD-10-CM | POA: Diagnosis not present

## 2016-08-24 DIAGNOSIS — C50912 Malignant neoplasm of unspecified site of left female breast: Secondary | ICD-10-CM

## 2016-08-24 NOTE — Telephone Encounter (Signed)
Verbal given to Mickel Baas.

## 2016-08-24 NOTE — Progress Notes (Signed)
  Radiation Oncology         [phone]) [phone] ________________________________  Stereotactic Treatment Procedure Note  Name: Savannah Howard MRN: [id number]  Date: 08/24/2016  DOB: 10/10/1945  SPECIAL TREATMENT PROCEDURE    ICD-9-CM ICD-10-CM   1. 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  It showed 3-dimensional radiation distributions overlaid onto the planning CT/MRI image set.  The [facility] for the target structures as well as the organs at risk were reviewed. The documentation of the 3D plan and dosimetry are filed in the radiation oncology EMR.  NARRATIVE:  Savannah Howard was brought to the TrueBeam stereotactic radiation treatment machine and placed supine on the CT couch. The head frame was applied, and the patient was set up for stereotactic radiosurgery.  Neurosurgery was present for the set-up and delivery  SIMULATION VERIFICATION:  In the couch zero-angle position, the patient underwent Exactrac imaging using the Brainlab system with orthogonal KV images.  These were carefully aligned and repeated to confirm treatment position for each of the isocenters.  The Exactrac snap film verification was repeated at each couch angle.  PROCEDURE: Savannah Howard received stereotactic radiosurgery to the following targets: Right temporal 4.9 cm target was treated using 4 Rapid Arc VMAT Beams to a prescription dose of 5 Gy to be repeated for 5 fractions to 25 Gy total.  ExacTrac registration was performed for each couch angle.  The 100% isodose line was prescribed.  6 MV X-rays were delivered in the flattening filter free beam mode.  STEREOTACTIC TREATMENT MANAGEMENT:  Following delivery, the patient was transported to nursing in stable condition and  monitored for possible acute effects.  Vital signs were recorded BP (!) 136/52   Pulse (!) 52   Temp 98.5 F (36.9 C) (Oral)   Resp 12 . The patient tolerated treatment without significant acute effects, and was discharged to home in stable condition.    PLAN: Follow-up in one month.  ________________________________  Sheral Apley. Tammi Klippel, M.D.  This document serves as a record of services personally performed by Tyler Pita, MD. It was created on his behalf by Bethann Humble, a trained medical scribe. The creation of this record is based on the scribe's personal observations and the provider's statements to them. This document has been checked and approved by the attending provider.

## 2016-08-24 NOTE — Progress Notes (Signed)
Pt to nursing for completion of [place] treatment.  Pt VS assessed.  Pt to stay in nursing for 15 minutes, no other further directions given.

## 2016-08-25 ENCOUNTER — Telehealth: Payer: Self-pay | Admitting: Internal Medicine

## 2016-08-25 DIAGNOSIS — Z483 Aftercare following surgery for neoplasm: Secondary | ICD-10-CM | POA: Diagnosis not present

## 2016-08-25 DIAGNOSIS — D649 Anemia, unspecified: Secondary | ICD-10-CM | POA: Diagnosis not present

## 2016-08-25 DIAGNOSIS — N183 Chronic kidney disease, stage 3 (moderate): Secondary | ICD-10-CM | POA: Diagnosis not present

## 2016-08-25 DIAGNOSIS — M519 Unspecified thoracic, thoracolumbar and lumbosacral intervertebral disc disorder: Secondary | ICD-10-CM | POA: Diagnosis not present

## 2016-08-25 DIAGNOSIS — I129 Hypertensive chronic kidney disease with stage 1 through stage 4 chronic kidney disease, or unspecified chronic kidney disease: Secondary | ICD-10-CM | POA: Diagnosis not present

## 2016-08-25 DIAGNOSIS — C7981 Secondary malignant neoplasm of breast: Secondary | ICD-10-CM | POA: Diagnosis not present

## 2016-08-25 NOTE — Telephone Encounter (Signed)
Verbal orders for below given to [name].

## 2016-08-25 NOTE — Telephone Encounter (Signed)
Requesting verbal orders to move last weeks visit to ether this week or next week.

## 2016-08-27 ENCOUNTER — Encounter: Payer: Self-pay | Admitting: Radiation Oncology

## 2016-08-27 DIAGNOSIS — C7981 Secondary malignant neoplasm of breast: Secondary | ICD-10-CM | POA: Diagnosis not present

## 2016-08-27 DIAGNOSIS — Z483 Aftercare following surgery for neoplasm: Secondary | ICD-10-CM | POA: Diagnosis not present

## 2016-08-27 DIAGNOSIS — M519 Unspecified thoracic, thoracolumbar and lumbosacral intervertebral disc disorder: Secondary | ICD-10-CM | POA: Diagnosis not present

## 2016-08-27 DIAGNOSIS — I129 Hypertensive chronic kidney disease with stage 1 through stage 4 chronic kidney disease, or unspecified chronic kidney disease: Secondary | ICD-10-CM | POA: Diagnosis not present

## 2016-08-27 DIAGNOSIS — D649 Anemia, unspecified: Secondary | ICD-10-CM | POA: Diagnosis not present

## 2016-08-27 DIAGNOSIS — N183 Chronic kidney disease, stage 3 (moderate): Secondary | ICD-10-CM | POA: Diagnosis not present

## 2016-08-27 NOTE — Progress Notes (Signed)
  Radiation Oncology         [phone] ________________________________  Name: Savannah Howard MRN: [id number]  Date: 08/27/2016  DOB: 1946/07/08  End of Treatment Note  Diagnosis:   Secondary malignant neoplasm of brain    Indication for treatment:  Palliative       Radiation treatment dates:   08/13/16-08/24/16  Site/dose:   1) PTV Right Temporal/ 25 Gy in 5 fractions   2) PTV Left Parietal/ 20 Gy in 1 fraction  Beams/energy:   1) SBRT SRT-VMAT / 6FFF    2) SBRT SRT-3D / 6FFF  Narrative: The patient tolerated radiation treatment relatively well. Patient was without complaint during treatment.  Plan: The patient has completed radiation treatment. The patient will return to radiation oncology clinic for routine followup in one month. I advised her to call or return sooner if she has any questions or concerns related to her recovery or treatment. ________________________________  Sheral Apley. Tammi Klippel, M.D.   This document serves as a record of services personally performed by Tyler Pita, MD. It was created on his behalf by Bethann Humble, a trained medical scribe. The creation of this record is based on the scribe's personal observations and the provider's statements to them. This document has been checked and approved by the attending provider.

## 2016-08-28 DIAGNOSIS — D649 Anemia, unspecified: Secondary | ICD-10-CM | POA: Diagnosis not present

## 2016-08-28 DIAGNOSIS — M519 Unspecified thoracic, thoracolumbar and lumbosacral intervertebral disc disorder: Secondary | ICD-10-CM | POA: Diagnosis not present

## 2016-08-28 DIAGNOSIS — Z483 Aftercare following surgery for neoplasm: Secondary | ICD-10-CM | POA: Diagnosis not present

## 2016-08-28 DIAGNOSIS — I129 Hypertensive chronic kidney disease with stage 1 through stage 4 chronic kidney disease, or unspecified chronic kidney disease: Secondary | ICD-10-CM | POA: Diagnosis not present

## 2016-08-28 DIAGNOSIS — C7981 Secondary malignant neoplasm of breast: Secondary | ICD-10-CM | POA: Diagnosis not present

## 2016-08-28 DIAGNOSIS — N183 Chronic kidney disease, stage 3 (moderate): Secondary | ICD-10-CM | POA: Diagnosis not present

## 2016-08-28 NOTE — Telephone Encounter (Signed)
Prescriptions sent, called in xanax to pharm

## 2016-08-28 NOTE — Telephone Encounter (Signed)
Needs ov

## 2016-08-31 DIAGNOSIS — N183 Chronic kidney disease, stage 3 (moderate): Secondary | ICD-10-CM | POA: Diagnosis not present

## 2016-08-31 DIAGNOSIS — D649 Anemia, unspecified: Secondary | ICD-10-CM | POA: Diagnosis not present

## 2016-08-31 DIAGNOSIS — C7981 Secondary malignant neoplasm of breast: Secondary | ICD-10-CM | POA: Diagnosis not present

## 2016-08-31 DIAGNOSIS — I129 Hypertensive chronic kidney disease with stage 1 through stage 4 chronic kidney disease, or unspecified chronic kidney disease: Secondary | ICD-10-CM | POA: Diagnosis not present

## 2016-08-31 DIAGNOSIS — Z483 Aftercare following surgery for neoplasm: Secondary | ICD-10-CM | POA: Diagnosis not present

## 2016-08-31 DIAGNOSIS — M519 Unspecified thoracic, thoracolumbar and lumbosacral intervertebral disc disorder: Secondary | ICD-10-CM | POA: Diagnosis not present

## 2016-09-02 DIAGNOSIS — I129 Hypertensive chronic kidney disease with stage 1 through stage 4 chronic kidney disease, or unspecified chronic kidney disease: Secondary | ICD-10-CM | POA: Diagnosis not present

## 2016-09-02 DIAGNOSIS — N183 Chronic kidney disease, stage 3 (moderate): Secondary | ICD-10-CM | POA: Diagnosis not present

## 2016-09-02 DIAGNOSIS — D649 Anemia, unspecified: Secondary | ICD-10-CM | POA: Diagnosis not present

## 2016-09-02 DIAGNOSIS — M519 Unspecified thoracic, thoracolumbar and lumbosacral intervertebral disc disorder: Secondary | ICD-10-CM | POA: Diagnosis not present

## 2016-09-02 DIAGNOSIS — Z483 Aftercare following surgery for neoplasm: Secondary | ICD-10-CM | POA: Diagnosis not present

## 2016-09-02 DIAGNOSIS — C7981 Secondary malignant neoplasm of breast: Secondary | ICD-10-CM | POA: Diagnosis not present

## 2016-09-03 DIAGNOSIS — D649 Anemia, unspecified: Secondary | ICD-10-CM | POA: Diagnosis not present

## 2016-09-03 DIAGNOSIS — I129 Hypertensive chronic kidney disease with stage 1 through stage 4 chronic kidney disease, or unspecified chronic kidney disease: Secondary | ICD-10-CM | POA: Diagnosis not present

## 2016-09-03 DIAGNOSIS — C7981 Secondary malignant neoplasm of breast: Secondary | ICD-10-CM | POA: Diagnosis not present

## 2016-09-03 DIAGNOSIS — Z483 Aftercare following surgery for neoplasm: Secondary | ICD-10-CM | POA: Diagnosis not present

## 2016-09-03 DIAGNOSIS — M519 Unspecified thoracic, thoracolumbar and lumbosacral intervertebral disc disorder: Secondary | ICD-10-CM | POA: Diagnosis not present

## 2016-09-03 DIAGNOSIS — N183 Chronic kidney disease, stage 3 (moderate): Secondary | ICD-10-CM | POA: Diagnosis not present

## 2016-09-04 ENCOUNTER — Encounter: Payer: Self-pay | Admitting: *Deleted

## 2016-09-04 DIAGNOSIS — Z483 Aftercare following surgery for neoplasm: Secondary | ICD-10-CM | POA: Diagnosis not present

## 2016-09-04 DIAGNOSIS — N183 Chronic kidney disease, stage 3 (moderate): Secondary | ICD-10-CM | POA: Diagnosis not present

## 2016-09-04 DIAGNOSIS — K219 Gastro-esophageal reflux disease without esophagitis: Secondary | ICD-10-CM

## 2016-09-04 DIAGNOSIS — E669 Obesity, unspecified: Secondary | ICD-10-CM

## 2016-09-04 DIAGNOSIS — M519 Unspecified thoracic, thoracolumbar and lumbosacral intervertebral disc disorder: Secondary | ICD-10-CM

## 2016-09-04 DIAGNOSIS — D649 Anemia, unspecified: Secondary | ICD-10-CM

## 2016-09-04 DIAGNOSIS — Z853 Personal history of malignant neoplasm of breast: Secondary | ICD-10-CM

## 2016-09-04 DIAGNOSIS — Z9181 History of falling: Secondary | ICD-10-CM

## 2016-09-04 DIAGNOSIS — I129 Hypertensive chronic kidney disease with stage 1 through stage 4 chronic kidney disease, or unspecified chronic kidney disease: Secondary | ICD-10-CM | POA: Diagnosis not present

## 2016-09-04 DIAGNOSIS — F419 Anxiety disorder, unspecified: Secondary | ICD-10-CM

## 2016-09-04 DIAGNOSIS — C7981 Secondary malignant neoplasm of breast: Secondary | ICD-10-CM | POA: Diagnosis not present

## 2016-09-04 NOTE — Progress Notes (Signed)
[place] Work  Clinical Social Work received call requesting resources for transportation for next [employer].  The patient shares she typically has transportation, but her "ride fell through".  CSW provided information for [facility]. patient will contact [facility] and is able to utilize cab service if necessary.  Polo Riley, MSW, LCSW, OSW-C Clinical Social Worker [facility] [phone]

## 2016-09-07 DIAGNOSIS — M519 Unspecified thoracic, thoracolumbar and lumbosacral intervertebral disc disorder: Secondary | ICD-10-CM | POA: Diagnosis not present

## 2016-09-07 DIAGNOSIS — C7981 Secondary malignant neoplasm of breast: Secondary | ICD-10-CM | POA: Diagnosis not present

## 2016-09-07 DIAGNOSIS — I129 Hypertensive chronic kidney disease with stage 1 through stage 4 chronic kidney disease, or unspecified chronic kidney disease: Secondary | ICD-10-CM | POA: Diagnosis not present

## 2016-09-07 DIAGNOSIS — D649 Anemia, unspecified: Secondary | ICD-10-CM | POA: Diagnosis not present

## 2016-09-07 DIAGNOSIS — N183 Chronic kidney disease, stage 3 (moderate): Secondary | ICD-10-CM | POA: Diagnosis not present

## 2016-09-07 DIAGNOSIS — Z483 Aftercare following surgery for neoplasm: Secondary | ICD-10-CM | POA: Diagnosis not present

## 2016-09-07 NOTE — Assessment & Plan Note (Signed)
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, no lymph nodes, grade 2-3, ER 5% PR 0% HER-2 positive ratio 6.96, Ki-67 is 80% T3 N0 M0 stage IIB clinical stage  Treatment summary: TCH-Perjeta 6 cycles started 12/31/2014 completed 04/22/2015 (decreased chemotherapy dosage with cycle 2 and discontinued Perjeta for diarrhea) Breast MRI 04/29/2015: Significant interval decrease in size of the mass, continued skin retraction, 4.3 x 2 x 1.8 cm (previously 4.9 x 3.9 x 3.9 cm) Left mastectomy 05/30/2015: CR to chemo, 0/6 LN  Did not need radiation based upon tumor board recommendation Herceptin maintenance completed May 2017 Echocardiogram 07/18/2015: EF 60-65% normal Brain metastases diagnosed November 2017  Treatment plan:  1. Stereotactic radiosurgery by Dr. Tammi Klippel 08/13/16 to 08/24/16 2. Lapatinib with anastrozole

## 2016-09-08 ENCOUNTER — Ambulatory Visit (HOSPITAL_BASED_OUTPATIENT_CLINIC_OR_DEPARTMENT_OTHER): Payer: Medicare Other | Admitting: Hematology and Oncology

## 2016-09-08 ENCOUNTER — Encounter: Payer: Self-pay | Admitting: Hematology and Oncology

## 2016-09-08 DIAGNOSIS — Z170 Estrogen receptor positive status [ER+]: Secondary | ICD-10-CM

## 2016-09-08 DIAGNOSIS — C50412 Malignant neoplasm of upper-outer quadrant of left female breast: Secondary | ICD-10-CM | POA: Diagnosis not present

## 2016-09-08 DIAGNOSIS — C7931 Secondary malignant neoplasm of brain: Secondary | ICD-10-CM | POA: Diagnosis not present

## 2016-09-08 MED ORDER — LAPATINIB DITOSYLATE 250 MG PO TABS: 1000.0000 mg | ORAL_TABLET | Freq: Every day | ORAL | 6 refills | Status: DC

## 2016-09-08 NOTE — Progress Notes (Signed)
Patient Care Team: [name], MD as PCP - General  DIAGNOSIS:  Encounter Diagnosis  Name Primary?  . Malignant neoplasm of upper-outer quadrant of left breast in female, estrogen receptor positive ([place])     SUMMARY OF ONCOLOGIC HISTORY:   Breast cancer of upper-outer quadrant of left female breast ([place] Howard)   12/07/2014 Initial Diagnosis    Left breast 2:00 position: Invasive ductal carcinoma grade 2, ER 5%, PR 0%, Ki-67 80%, HER-2 positive ratio 6.96      12/07/2014 Mammogram    Large left breast mass 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, 2 small benign nodules in the right breast stable since 2002, other consistent with normal intramammary lymph node left axilla ultrasound normal-sized lymph nodes      12/31/2014 - 04/22/2015 Neo-Adjuvant Chemotherapy    TCH Perjeta neoadjuvant chemotherapy 6 (decreased chemotherapy dosage with cycle 2 and discontinued Perjeta for diarrhea)       04/29/2015 Breast MRI    Significant interval decrease in size of the mass, continued skin retraction, 4.3 x 2 x 1.8 cm (previously 4.9 x 3.9 x 3.9 cm)      05/30/2015 Surgery    Left mastectomy: Path CR, 0/6 LN Negative      09/09/2015 -  Anti-estrogen oral therapy    Anastrozole 1 mg daily, Lapatinib added 09/09/15      07/09/2016 Imaging    Patient presented in the emergency department after a fall at home without help for 24 hours, CT head revealed 3.9 x 3.5 cm cystic mass anterior right temporal lobe with vasogenic edema, MRI revealed 4.9 cm mass      07/09/2016 - 07/16/2016 Hospital Admission    Admitted with brain metastases after a fall at home, rhabdomyolysis       07/13/2016 Surgery    Craniotomy by Dr.Ditty: Metastatic breast cancer ER 40%, PR 0%, HER-2 positive; postcraniotomy MRI showed residual enhancement along the anterior and superior margin, stable 1.3 cm left parietal lobe met.      08/11/2016 Imaging    Interval progression of Rt temporal mets 3.2 cm, Lt parietal intra axial mets 1 cm  size       08/13/2016 - 08/24/2016 Radiation Therapy    SRS Rt Temporal and Left Parietal lobes       CHIEF COMPLIANT: Follow-up after radiation therapy to the brain  INTERVAL HISTORY: Savannah Howard is a 71 year old with above-mentioned history of metastatic breast cancer with brain metastases who completed palliative stereotactic radiosurgery to the brain lesions. She has recovered very well from this neurological injury. She is able to get around with help of a walker. She gets help from her friends to get the groceries and other shopping. She has a home health nurse was giving her physical therapy instructions.  REVIEW OF SYSTEMS:   Constitutional: Denies fevers, chills or abnormal weight loss Eyes: Denies blurriness of vision Ears, nose, mouth, throat, and face: Denies mucositis or sore throat Respiratory: Denies cough, dyspnea or wheezes Cardiovascular: Denies palpitation, chest discomfort Gastrointestinal:  Denies nausea, heartburn or change in bowel habits Skin: Denies abnormal skin rashes Lymphatics: Denies new lymphadenopathy or easy bruising Neurological: No neurological deficits. Generalized weakness requiring wheelchair for ambulation. Behavioral/Psych: Mood is stable, no new changes  Extremities: No lower extremity edema  All other systems were reviewed with the patient and are negative.  I have reviewed the past medical history, past surgical history, social history and family history with the patient and they are unchanged from previous  note.  ALLERGIES:  is allergic to penicillins and sulfonamide derivatives.  MEDICATIONS:  Current Outpatient Prescriptions  Medication Sig Dispense Refill  . acetaminophen (TYLENOL) 500 MG tablet Take 1,000 mg by mouth at bedtime.     Marland Kitchen anastrozole (ARIMIDEX) 1 MG tablet TAKE 1 TABLET (1 MG TOTAL) BY MOUTH DAILY. 90 tablet 2  . cyanocobalamin (,VITAMIN B-12,) 1000 MCG/ML injection Inject 1,000 mcg into the muscle every 30 (thirty)  days.    Marland Kitchen dexamethasone (DECADRON) 2 MG tablet Take 1 mg by mouth 4 (four) times daily.     Marland Kitchen docusate sodium (COLACE) 100 MG capsule Take 1 capsule (100 mg total) by mouth 2 (two) times daily. 10 capsule 0  . HYDROcodone-acetaminophen (NORCO/VICODIN) 5-325 MG tablet Take 1 tablet by mouth every 6 (six) hours as needed for moderate pain.    . lapatinib (TYKERB) 250 MG tablet Take 4 tablets (1,000 mg total) by mouth daily. 120 tablet 6  . levETIRAcetam (KEPPRA) 750 MG tablet Take 1 tablet (750 mg total) by mouth 2 (two) times daily.    Marland Kitchen LORazepam (ATIVAN) 0.5 MG tablet TAKE 1 TABLET BY MOUTH 3 TIMES A DAY AS NEEDED FOR ANXIETY 270 tablet 0  . losartan-hydrochlorothiazide (HYZAAR) 100-25 MG tablet TAKE 1 TABLET BY MOUTH EVERY DAY 90 tablet 0  . metoprolol succinate (TOPROL-XL) 25 MG 24 hr tablet TAKE 1 TABLET (25 MG TOTAL) BY MOUTH DAILY. 30 tablet 1  . omeprazole (PRILOSEC) 20 MG capsule Take 20 mg by mouth daily.    . potassium chloride (MICRO-K) 10 MEQ CR capsule TAKE ONE CAPSULE BY MOUTH EVERY DAY 90 capsule 0  . terazosin (HYTRIN) 5 MG capsule Take 5 mg by mouth at bedtime. Do not crush    . vitamin B-12 (CYANOCOBALAMIN) 1000 MCG tablet Take 1,000 mcg by mouth daily.     No current facility-administered medications for this visit.     PHYSICAL EXAMINATION: ECOG PERFORMANCE STATUS: 1 - Symptomatic but completely ambulatory  Vitals:   09/08/16 1121  BP: 137/62  Pulse: (!) 57  Resp: 18  Temp: 97.5 F (36.4 C)   Filed Weights   09/08/16 1121  Weight: 201 lb 4.8 oz (91.3 kg)    GENERAL:alert, no distress and comfortable SKIN: skin color, texture, turgor are normal, no rashes or significant lesions EYES: normal, Conjunctiva are pink and non-injected, sclera clear OROPHARYNX:no exudate, no erythema and lips, buccal mucosa, and tongue normal  NECK: supple, thyroid normal size, non-tender, without nodularity LYMPH:  no palpable lymphadenopathy in the cervical, axillary or  inguinal LUNGS: clear to auscultation and percussion with normal breathing effort HEART: regular rate & rhythm and no murmurs and no lower extremity edema ABDOMEN:abdomen soft, non-tender and normal bowel sounds MUSCULOSKELETAL:no cyanosis of digits and no clubbing  NEURO: alert & oriented x 3 with fluent speech, no focal motor/sensory deficits EXTREMITIES: No lower extremity edema  LABORATORY DATA:  I have reviewed the data as listed   Chemistry      Component Value Date/Time   NA 136 (A) 07/20/2016   NA 141 10/07/2015 0843   K 3.4 07/20/2016   K 3.4 (L) 10/07/2015 0843   CL 102 07/14/2016 0530   CO2 24 07/14/2016 0530   CO2 22 10/07/2015 0843   BUN 44.1 (H) 08/13/2016 1422   CREATININE 1.0 08/13/2016 1422   GLU 115 07/20/2016      Component Value Date/Time   CALCIUM 8.1 (L) 07/14/2016 0530   CALCIUM 10.2 10/07/2015 0843   ALKPHOS  68 07/09/2016 2339   ALKPHOS 76 10/07/2015 0843   AST 71 (H) 07/09/2016 2339   AST 13 10/07/2015 0843   ALT 22 07/09/2016 2339   ALT 13 10/07/2015 0843   BILITOT 0.9 07/09/2016 2339   BILITOT 0.35 10/07/2015 0843       Lab Results  Component Value Date   WBC 14.1 07/20/2016   HGB 10.9 (A) 07/20/2016   HCT 33 (A) 07/20/2016   MCV 94.7 07/14/2016   PLT 264 07/20/2016   NEUTROABS 13.5 (H) 07/09/2016    ASSESSMENT & PLAN:  Breast cancer of upper-outer quadrant of left female breast ([place]) 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, no lymph nodes, grade 2-3, ER 5% PR 0% HER-2 positive ratio 6.96, Ki-67 is 80% T3 N0 M0 stage IIB clinical stage  Treatment summary: TCH-Perjeta 6 cycles started 12/31/2014 completed 04/22/2015 (decreased chemotherapy dosage with cycle 2 and discontinued Perjeta for diarrhea) Breast MRI 04/29/2015: Significant interval decrease in size of the mass, continued skin retraction, 4.3 x 2 x 1.8 cm (previously 4.9 x 3.9 x 3.9 cm) Left mastectomy 05/30/2015: CR to chemo, 0/6 LN  Did not  need radiation based upon tumor board recommendation Herceptin maintenance completed May 2017 Echocardiogram 07/18/2015: EF 60-65% normal Brain metastases diagnosed November 2017  Treatment plan:  1. Stereotactic radiosurgery by Dr. Tammi Klippel 08/13/16 to 08/24/16 2. Lapatinib with anastrozole Lapatinib counseling: I discussed with the patient the dosing of lapatinib and the risks involving diarrhea. She will start off at 500 mg a day then will increase it to 750 Then She Will Increase to 1000 Mg Daily. If she tolerates 1000 very well we may even consider going up to 1250 mg.  I spent 25 minutes talking to the patient of which more than half was spent in counseling and coordination of care.  No orders of the defined types were placed in this encounter.  The patient has a good understanding of the overall plan. she agrees with it. she will call with any problems that may develop before the next visit here.   Rulon Eisenmenger, MD 09/08/16

## 2016-09-09 ENCOUNTER — Telehealth: Payer: Self-pay | Admitting: Pharmacist

## 2016-09-09 DIAGNOSIS — N183 Chronic kidney disease, stage 3 (moderate): Secondary | ICD-10-CM | POA: Diagnosis not present

## 2016-09-09 DIAGNOSIS — C7981 Secondary malignant neoplasm of breast: Secondary | ICD-10-CM | POA: Diagnosis not present

## 2016-09-09 DIAGNOSIS — M519 Unspecified thoracic, thoracolumbar and lumbosacral intervertebral disc disorder: Secondary | ICD-10-CM | POA: Diagnosis not present

## 2016-09-09 DIAGNOSIS — Z483 Aftercare following surgery for neoplasm: Secondary | ICD-10-CM | POA: Diagnosis not present

## 2016-09-09 DIAGNOSIS — D649 Anemia, unspecified: Secondary | ICD-10-CM | POA: Diagnosis not present

## 2016-09-09 DIAGNOSIS — I129 Hypertensive chronic kidney disease with stage 1 through stage 4 chronic kidney disease, or unspecified chronic kidney disease: Secondary | ICD-10-CM | POA: Diagnosis not present

## 2016-09-09 NOTE — Telephone Encounter (Signed)
Oral Chemotherapy Pharmacist Encounter  Received notification from [facility] that patient's Tykerb prescription would require prior authorization. Oral Oncology Clinic was not involved in sending original Rx to pharmacy.  CBC and BMET from 07/20/16 reviewed, ok for treatment Last liver function tests performed 07/09/16 showed slightly elevated AST, appears BMET is being checked frequently, but not full CMET  Current medication list in Epic assessed, DDIs with Tykerb identified:  Tykerb and dexamethasone: Category X interaction: dexamethasone is an inducer of CYP3A4 and has the possibility to decrease exposure to the Tykerb  Tykerb and omeprazole: Category B interaction: decrease in gastric acid may decrease absorption and exposure to Tykerb. No change in therapy is indicated  Prior authorization initiated on the phone to [place] at [phone] Status is pending Determination may take 24-72 hours  Oral Oncology Clinic will continue to follow.  Johny Drilling, PharmD, BCPS, BCOP 09/09/2016  3:09 PM Oral Oncology Clinic [phone]

## 2016-09-10 DIAGNOSIS — I129 Hypertensive chronic kidney disease with stage 1 through stage 4 chronic kidney disease, or unspecified chronic kidney disease: Secondary | ICD-10-CM | POA: Diagnosis not present

## 2016-09-10 DIAGNOSIS — N183 Chronic kidney disease, stage 3 (moderate): Secondary | ICD-10-CM | POA: Diagnosis not present

## 2016-09-10 DIAGNOSIS — D649 Anemia, unspecified: Secondary | ICD-10-CM | POA: Diagnosis not present

## 2016-09-10 DIAGNOSIS — C7981 Secondary malignant neoplasm of breast: Secondary | ICD-10-CM | POA: Diagnosis not present

## 2016-09-10 DIAGNOSIS — M519 Unspecified thoracic, thoracolumbar and lumbosacral intervertebral disc disorder: Secondary | ICD-10-CM | POA: Diagnosis not present

## 2016-09-10 DIAGNOSIS — Z483 Aftercare following surgery for neoplasm: Secondary | ICD-10-CM | POA: Diagnosis not present

## 2016-09-14 DIAGNOSIS — M519 Unspecified thoracic, thoracolumbar and lumbosacral intervertebral disc disorder: Secondary | ICD-10-CM | POA: Diagnosis not present

## 2016-09-14 DIAGNOSIS — C7981 Secondary malignant neoplasm of breast: Secondary | ICD-10-CM | POA: Diagnosis not present

## 2016-09-14 DIAGNOSIS — Z483 Aftercare following surgery for neoplasm: Secondary | ICD-10-CM | POA: Diagnosis not present

## 2016-09-14 DIAGNOSIS — N183 Chronic kidney disease, stage 3 (moderate): Secondary | ICD-10-CM | POA: Diagnosis not present

## 2016-09-14 DIAGNOSIS — D649 Anemia, unspecified: Secondary | ICD-10-CM | POA: Diagnosis not present

## 2016-09-14 DIAGNOSIS — I129 Hypertensive chronic kidney disease with stage 1 through stage 4 chronic kidney disease, or unspecified chronic kidney disease: Secondary | ICD-10-CM | POA: Diagnosis not present

## 2016-09-15 ENCOUNTER — Telehealth: Payer: Self-pay | Admitting: Pharmacist

## 2016-09-15 NOTE — Telephone Encounter (Signed)
Oral Chemotherapy Pharmacist Encounter  Received notification from [facility] that prior authorization for patient's Tykerb has been approved Ref # [id number] Effective dates: 08/08/16-08/09/17  I alerted [facility] to run patient's prescription Copayment $2052.60  I called patient to alert her of copay. Permission to seek foundation assistance granted. Patient [facility]) currently has open funds. I will update a separate encounter with grant information.  Oral Oncology Clinic will follow up with patient for initial counseling, start date, and toxicity/adherebnce management.  Johny Drilling, PharmD, BCPS, BCOP 09/15/2016  12:12 PM Oral Oncology Clinic [phone]

## 2016-09-15 NOTE — Telephone Encounter (Signed)
Oral Chemotherapy Pharmacist Encounter  Successfully enrolled patient for copayment assistance funds from Patient [facility]) Award amount: $8000 Effective dates: 09/15/16-09/14/17 ID: [id number] BIN: [id number] Group: [id number] PCN: [id number]  Billing information will be faxed to [facility]. I will place a copy of the award letter to be scanned into patient's chart.  Johny Drilling, PharmD, BCPS, BCOP 09/15/2016  1:14 PM Oral Oncology Clinic [phone]

## 2016-09-16 DIAGNOSIS — N183 Chronic kidney disease, stage 3 (moderate): Secondary | ICD-10-CM | POA: Diagnosis not present

## 2016-09-16 DIAGNOSIS — M519 Unspecified thoracic, thoracolumbar and lumbosacral intervertebral disc disorder: Secondary | ICD-10-CM | POA: Diagnosis not present

## 2016-09-16 DIAGNOSIS — Z483 Aftercare following surgery for neoplasm: Secondary | ICD-10-CM | POA: Diagnosis not present

## 2016-09-16 DIAGNOSIS — C7981 Secondary malignant neoplasm of breast: Secondary | ICD-10-CM | POA: Diagnosis not present

## 2016-09-16 DIAGNOSIS — I129 Hypertensive chronic kidney disease with stage 1 through stage 4 chronic kidney disease, or unspecified chronic kidney disease: Secondary | ICD-10-CM | POA: Diagnosis not present

## 2016-09-16 DIAGNOSIS — D649 Anemia, unspecified: Secondary | ICD-10-CM | POA: Diagnosis not present

## 2016-09-17 ENCOUNTER — Telehealth: Payer: Self-pay | Admitting: Internal Medicine

## 2016-09-17 NOTE — Telephone Encounter (Signed)
Requesting orders to continue PT for twice a week for three weeks. Can leave vm.

## 2016-09-17 NOTE — Telephone Encounter (Signed)
Ok Thx 

## 2016-09-18 DIAGNOSIS — N183 Chronic kidney disease, stage 3 (moderate): Secondary | ICD-10-CM | POA: Diagnosis not present

## 2016-09-18 DIAGNOSIS — Z483 Aftercare following surgery for neoplasm: Secondary | ICD-10-CM | POA: Diagnosis not present

## 2016-09-18 DIAGNOSIS — D649 Anemia, unspecified: Secondary | ICD-10-CM | POA: Diagnosis not present

## 2016-09-18 DIAGNOSIS — C7981 Secondary malignant neoplasm of breast: Secondary | ICD-10-CM | POA: Diagnosis not present

## 2016-09-18 DIAGNOSIS — I129 Hypertensive chronic kidney disease with stage 1 through stage 4 chronic kidney disease, or unspecified chronic kidney disease: Secondary | ICD-10-CM | POA: Diagnosis not present

## 2016-09-18 DIAGNOSIS — M519 Unspecified thoracic, thoracolumbar and lumbosacral intervertebral disc disorder: Secondary | ICD-10-CM | POA: Diagnosis not present

## 2016-09-18 NOTE — Telephone Encounter (Signed)
Verbals given  

## 2016-09-18 NOTE — Progress Notes (Signed)
Miss Savannah Howard. Savannah Howard 71 y.o. woman with Secondary malignant neoplasm of brain radiation completed 08-24-16 one month FU.  Headache: Occasionally if she exerts herself. Dizziness:None  Nausea/vomiting/Ataxia: Georgina Snell to do free walking using her furniture in her apartment; unsteady on her feet with distant walking receiving PT for walking ,balance ,strengthening of upper body Visual changes/Diplopia:None Ringing in ears: Not hearing as well since her first chemotherapy treatment, feels her hearing is improving. Fatigue:No unless it's thirty minutes to an hour. Cognitive changes:No memory issues able to complete questions without difficulty. Weight: Wt Readings from Last 3 Encounters:  09/28/16 205 lb 12.8 oz (93.4 kg)  09/08/16 201 lb 4.8 oz (91.3 kg)  08/17/16 190 lb (86.2 kg)   Decadron 2 mg qid has not taken for the past four days out of the medication, no signs of thrush. Out of the follow drugs Kappa, Metoprolol and Terazosin having a problem getting them filled at [facility] called in a over a week ago. BP (!) 151/82   Pulse (!) 56   Temp 98.2 F (36.8 C) (Oral)   Resp 18   Ht 5\' 2"  (1.575 m)   Wt 205 lb 12.8 oz (93.4 kg)   SpO2 98%   BMI 37.64 kg/m

## 2016-09-19 ENCOUNTER — Other Ambulatory Visit: Payer: Self-pay | Admitting: Internal Medicine

## 2016-09-21 DIAGNOSIS — Z483 Aftercare following surgery for neoplasm: Secondary | ICD-10-CM | POA: Diagnosis not present

## 2016-09-21 DIAGNOSIS — N183 Chronic kidney disease, stage 3 (moderate): Secondary | ICD-10-CM | POA: Diagnosis not present

## 2016-09-21 DIAGNOSIS — I129 Hypertensive chronic kidney disease with stage 1 through stage 4 chronic kidney disease, or unspecified chronic kidney disease: Secondary | ICD-10-CM | POA: Diagnosis not present

## 2016-09-21 DIAGNOSIS — D649 Anemia, unspecified: Secondary | ICD-10-CM | POA: Diagnosis not present

## 2016-09-21 DIAGNOSIS — M519 Unspecified thoracic, thoracolumbar and lumbosacral intervertebral disc disorder: Secondary | ICD-10-CM | POA: Diagnosis not present

## 2016-09-21 DIAGNOSIS — C7981 Secondary malignant neoplasm of breast: Secondary | ICD-10-CM | POA: Diagnosis not present

## 2016-09-23 DIAGNOSIS — M519 Unspecified thoracic, thoracolumbar and lumbosacral intervertebral disc disorder: Secondary | ICD-10-CM | POA: Diagnosis not present

## 2016-09-23 DIAGNOSIS — N183 Chronic kidney disease, stage 3 (moderate): Secondary | ICD-10-CM | POA: Diagnosis not present

## 2016-09-23 DIAGNOSIS — I129 Hypertensive chronic kidney disease with stage 1 through stage 4 chronic kidney disease, or unspecified chronic kidney disease: Secondary | ICD-10-CM | POA: Diagnosis not present

## 2016-09-23 DIAGNOSIS — Z483 Aftercare following surgery for neoplasm: Secondary | ICD-10-CM | POA: Diagnosis not present

## 2016-09-23 DIAGNOSIS — C7981 Secondary malignant neoplasm of breast: Secondary | ICD-10-CM | POA: Diagnosis not present

## 2016-09-23 DIAGNOSIS — D649 Anemia, unspecified: Secondary | ICD-10-CM | POA: Diagnosis not present

## 2016-09-25 DIAGNOSIS — M519 Unspecified thoracic, thoracolumbar and lumbosacral intervertebral disc disorder: Secondary | ICD-10-CM | POA: Diagnosis not present

## 2016-09-25 DIAGNOSIS — D649 Anemia, unspecified: Secondary | ICD-10-CM | POA: Diagnosis not present

## 2016-09-25 DIAGNOSIS — N183 Chronic kidney disease, stage 3 (moderate): Secondary | ICD-10-CM | POA: Diagnosis not present

## 2016-09-25 DIAGNOSIS — C7981 Secondary malignant neoplasm of breast: Secondary | ICD-10-CM | POA: Diagnosis not present

## 2016-09-25 DIAGNOSIS — Z483 Aftercare following surgery for neoplasm: Secondary | ICD-10-CM | POA: Diagnosis not present

## 2016-09-25 DIAGNOSIS — I129 Hypertensive chronic kidney disease with stage 1 through stage 4 chronic kidney disease, or unspecified chronic kidney disease: Secondary | ICD-10-CM | POA: Diagnosis not present

## 2016-09-28 ENCOUNTER — Ambulatory Visit
Admission: RE | Admit: 2016-09-28 | Discharge: 2016-09-28 | Disposition: A | Discharge status: Home / Self Care | Payer: Medicare Other | Source: Ambulatory Visit | Attending: Radiation Oncology | Admitting: Radiation Oncology

## 2016-09-28 VITALS — BP 151/82 | HR 56 | Temp 98.2°F | Resp 18 | Ht 62.0 in | Wt 205.8 lb

## 2016-09-28 DIAGNOSIS — Z9889 Other specified postprocedural states: Secondary | ICD-10-CM | POA: Insufficient documentation

## 2016-09-28 DIAGNOSIS — Z880 Allergy status to penicillin: Secondary | ICD-10-CM | POA: Insufficient documentation

## 2016-09-28 DIAGNOSIS — C7931 Secondary malignant neoplasm of brain: Secondary | ICD-10-CM | POA: Diagnosis not present

## 2016-09-28 DIAGNOSIS — Z170 Estrogen receptor positive status [ER+]: Secondary | ICD-10-CM | POA: Diagnosis not present

## 2016-09-28 DIAGNOSIS — C50412 Malignant neoplasm of upper-outer quadrant of left female breast: Secondary | ICD-10-CM

## 2016-09-28 DIAGNOSIS — C50912 Malignant neoplasm of unspecified site of left female breast: Secondary | ICD-10-CM

## 2016-09-28 NOTE — Addendum Note (Signed)
Encounter addended by: Malena Edman, RN on: 09/28/2016 11:59 AM<BR>    Actions taken: Charge Capture section accepted

## 2016-09-28 NOTE — Progress Notes (Signed)
Radiation Oncology         [phone] ________________________________  Name: Savannah Howard MRN: [id number]  Date: 09/28/2016  DOB: 11/05/1945  Post Treatment Note  CC: Alex [name], MD  Ditty, Benjamin Jared, *  Diagnosis:   Recurrent metastatic ER positive, HER2 amplified invasive ductal carcinoma of the left breast with disease in the brain.    Interval Since Last Radiation: 5 weeks    08/13/16-08/24/16 Postop SRS Treatment: 1. PTV Right Temporal/ 25 Gy in 5 fractions 2. PTV Left Parietal/ 20 Gy in 1 fraction  Narrative:  The patient returns today for routine follow-up. She tolerated radiotherapy well without significant side effects. She continues on Anastrazole under the care of Dr. Gudena.   She has been on Dexamethasone 2 mg QID since treatment, but ran out of her medication a few days ago.                   On review of systems, the patient states she's been doing well with the assistance of PT at home. She is in need of her medications being refilled. She denies any diarrhea, nausea, diaphoresis, palpitations. She denies any headaches, changes in auditory or visual acuity. No other complaints are noted.  ALLERGIES:  is allergic to penicillins and sulfonamide derivatives.  Meds: Current Outpatient Prescriptions  Medication Sig Dispense Refill  . acetaminophen (TYLENOL) 500 MG tablet Take 1,000 mg by mouth at bedtime.     . anastrozole (ARIMIDEX) 1 MG tablet TAKE 1 TABLET (1 MG TOTAL) BY MOUTH DAILY. 90 tablet 2  . cyanocobalamin (,VITAMIN B-12,) 1000 MCG/ML injection Inject 1,000 mcg into the muscle every 30 (thirty) days.    . LORazepam (ATIVAN) 0.5 MG tablet TAKE 1 TABLET BY MOUTH 3 TIMES A DAY AS NEEDED FOR ANXIETY 270 tablet 0  . losartan-hydrochlorothiazide (HYZAAR) 100-25 MG tablet TAKE 1 TABLET BY MOUTH EVERY DAY 90 tablet 0  . omeprazole (PRILOSEC) 20 MG capsule Take 20 mg by mouth daily.    . potassium chloride (MICRO-K) 10 MEQ CR capsule TAKE ONE CAPSULE BY  MOUTH EVERY DAY 90 capsule 0  . vitamin B-12 (CYANOCOBALAMIN) 1000 MCG tablet Take 1,000 mcg by mouth daily.    . dexamethasone (DECADRON) 2 MG tablet Take 1 mg by mouth 4 (four) times daily.     . docusate sodium (COLACE) 100 MG capsule Take 1 capsule (100 mg total) by mouth 2 (two) times daily. (Patient not taking: Reported on 09/28/2016) 10 capsule 0  . HYDROcodone-acetaminophen (NORCO/VICODIN) 5-325 MG tablet Take 1 tablet by mouth every 6 (six) hours as needed for moderate pain.    . lapatinib (TYKERB) 250 MG tablet Take 4 tablets (1,000 mg total) by mouth daily. (Patient not taking: Reported on 09/28/2016) 120 tablet 6  . levETIRAcetam (KEPPRA) 750 MG tablet Take 1 tablet (750 mg total) by mouth 2 (two) times daily. (Patient not taking: Reported on 09/28/2016)    . metoprolol succinate (TOPROL-XL) 25 MG 24 hr tablet TAKE 1 TABLET (25 MG TOTAL) BY MOUTH DAILY. (Patient not taking: Reported on 09/28/2016) 30 tablet 1  . terazosin (HYTRIN) 5 MG capsule Take 5 mg by mouth at bedtime. Do not crush     No current facility-administered medications for this encounter.     Physical Findings:  height is 5' 2" (1.575 m) and weight is 205 lb 12.8 oz (93.4 kg). Her oral temperature is 98.2 F (36.8 C). Her blood pressure is 151/82 (abnormal) and her pulse is 56 (  abnormal). Her respiration is 18 and oxygen saturation is 98%.  Pain Assessment Pain Score: 0-No pain/10 In general this is a well appearing Caucasian female in no acute distress. She's alert and oriented x4 and appropriate throughout the examination. Cardiopulmonary assessment is negative for acute distress and she exhibits normal effort. HEENT reveals that she is normocephalic, atraumatic. EOMs are intact. She has moon facies, and no focal neurologic deficits.   Lab Findings: Lab Results  Component Value Date   WBC 14.1 07/20/2016   HGB 10.9 (A) 07/20/2016   HCT 33 (A) 07/20/2016   MCV 94.7 07/14/2016   PLT 264 07/20/2016      Radiographic Findings: No results found.  Impression/Plan: 1. 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  The patient appears to be doing well overall since completing radiotherapy. I have refilled her Dexamethasone and encouraged her to begin a taper of 2 mg TID x 5 days, 2 mg BID x 5 days, 2 mg daily x 5 days, then 2 mg QOD x 5 days, then stop. Her Keppra was also refilled. 2. HTN. Her antihypertensives and syringes for her vitamin b12 injections were called to her pharmacy.     Alison C. Perkins, PAC    

## 2016-09-29 ENCOUNTER — Telehealth: Payer: Self-pay | Admitting: Internal Medicine

## 2016-09-29 DIAGNOSIS — M519 Unspecified thoracic, thoracolumbar and lumbosacral intervertebral disc disorder: Secondary | ICD-10-CM | POA: Diagnosis not present

## 2016-09-29 DIAGNOSIS — C7981 Secondary malignant neoplasm of breast: Secondary | ICD-10-CM | POA: Diagnosis not present

## 2016-09-29 DIAGNOSIS — Z483 Aftercare following surgery for neoplasm: Secondary | ICD-10-CM | POA: Diagnosis not present

## 2016-09-29 DIAGNOSIS — N183 Chronic kidney disease, stage 3 (moderate): Secondary | ICD-10-CM | POA: Diagnosis not present

## 2016-09-29 DIAGNOSIS — I129 Hypertensive chronic kidney disease with stage 1 through stage 4 chronic kidney disease, or unspecified chronic kidney disease: Secondary | ICD-10-CM | POA: Diagnosis not present

## 2016-09-29 DIAGNOSIS — D649 Anemia, unspecified: Secondary | ICD-10-CM | POA: Diagnosis not present

## 2016-09-29 NOTE — Telephone Encounter (Signed)
OK. Thx

## 2016-09-29 NOTE — Telephone Encounter (Signed)
[name] called from [facility] request verbal order to discharge Savannah Howard from speech therapist. Please call her back.

## 2016-09-30 DIAGNOSIS — D649 Anemia, unspecified: Secondary | ICD-10-CM | POA: Diagnosis not present

## 2016-09-30 DIAGNOSIS — M519 Unspecified thoracic, thoracolumbar and lumbosacral intervertebral disc disorder: Secondary | ICD-10-CM | POA: Diagnosis not present

## 2016-09-30 DIAGNOSIS — N183 Chronic kidney disease, stage 3 (moderate): Secondary | ICD-10-CM | POA: Diagnosis not present

## 2016-09-30 DIAGNOSIS — Z483 Aftercare following surgery for neoplasm: Secondary | ICD-10-CM | POA: Diagnosis not present

## 2016-09-30 DIAGNOSIS — I129 Hypertensive chronic kidney disease with stage 1 through stage 4 chronic kidney disease, or unspecified chronic kidney disease: Secondary | ICD-10-CM | POA: Diagnosis not present

## 2016-09-30 DIAGNOSIS — C7981 Secondary malignant neoplasm of breast: Secondary | ICD-10-CM | POA: Diagnosis not present

## 2016-09-30 NOTE — Telephone Encounter (Signed)
Spoke with [name], she is aware.

## 2016-10-02 DIAGNOSIS — N183 Chronic kidney disease, stage 3 (moderate): Secondary | ICD-10-CM | POA: Diagnosis not present

## 2016-10-02 DIAGNOSIS — M519 Unspecified thoracic, thoracolumbar and lumbosacral intervertebral disc disorder: Secondary | ICD-10-CM | POA: Diagnosis not present

## 2016-10-02 DIAGNOSIS — D649 Anemia, unspecified: Secondary | ICD-10-CM | POA: Diagnosis not present

## 2016-10-02 DIAGNOSIS — C7981 Secondary malignant neoplasm of breast: Secondary | ICD-10-CM | POA: Diagnosis not present

## 2016-10-02 DIAGNOSIS — Z483 Aftercare following surgery for neoplasm: Secondary | ICD-10-CM | POA: Diagnosis not present

## 2016-10-02 DIAGNOSIS — I129 Hypertensive chronic kidney disease with stage 1 through stage 4 chronic kidney disease, or unspecified chronic kidney disease: Secondary | ICD-10-CM | POA: Diagnosis not present

## 2016-10-07 ENCOUNTER — Telehealth: Payer: Self-pay | Admitting: Internal Medicine

## 2016-10-07 DIAGNOSIS — D649 Anemia, unspecified: Secondary | ICD-10-CM | POA: Diagnosis not present

## 2016-10-07 DIAGNOSIS — N183 Chronic kidney disease, stage 3 (moderate): Secondary | ICD-10-CM | POA: Diagnosis not present

## 2016-10-07 DIAGNOSIS — Z483 Aftercare following surgery for neoplasm: Secondary | ICD-10-CM | POA: Diagnosis not present

## 2016-10-07 DIAGNOSIS — I129 Hypertensive chronic kidney disease with stage 1 through stage 4 chronic kidney disease, or unspecified chronic kidney disease: Secondary | ICD-10-CM | POA: Diagnosis not present

## 2016-10-07 DIAGNOSIS — C7981 Secondary malignant neoplasm of breast: Secondary | ICD-10-CM | POA: Diagnosis not present

## 2016-10-07 DIAGNOSIS — M519 Unspecified thoracic, thoracolumbar and lumbosacral intervertebral disc disorder: Secondary | ICD-10-CM | POA: Diagnosis not present

## 2016-10-07 NOTE — Telephone Encounter (Signed)
Requesting orders for PT twice a week for one additional week. States you can leave vm if she does not answer.

## 2016-10-08 NOTE — Telephone Encounter (Signed)
Spoke to [place] with [facility]. Verbal okay given for PT as requested

## 2016-10-09 DIAGNOSIS — Z483 Aftercare following surgery for neoplasm: Secondary | ICD-10-CM | POA: Diagnosis not present

## 2016-10-09 DIAGNOSIS — I129 Hypertensive chronic kidney disease with stage 1 through stage 4 chronic kidney disease, or unspecified chronic kidney disease: Secondary | ICD-10-CM | POA: Diagnosis not present

## 2016-10-09 DIAGNOSIS — N183 Chronic kidney disease, stage 3 (moderate): Secondary | ICD-10-CM | POA: Diagnosis not present

## 2016-10-09 DIAGNOSIS — M519 Unspecified thoracic, thoracolumbar and lumbosacral intervertebral disc disorder: Secondary | ICD-10-CM | POA: Diagnosis not present

## 2016-10-09 DIAGNOSIS — D649 Anemia, unspecified: Secondary | ICD-10-CM | POA: Diagnosis not present

## 2016-10-09 DIAGNOSIS — C7981 Secondary malignant neoplasm of breast: Secondary | ICD-10-CM | POA: Diagnosis not present

## 2016-10-12 DIAGNOSIS — Z483 Aftercare following surgery for neoplasm: Secondary | ICD-10-CM | POA: Diagnosis not present

## 2016-10-12 DIAGNOSIS — D649 Anemia, unspecified: Secondary | ICD-10-CM | POA: Diagnosis not present

## 2016-10-12 DIAGNOSIS — I129 Hypertensive chronic kidney disease with stage 1 through stage 4 chronic kidney disease, or unspecified chronic kidney disease: Secondary | ICD-10-CM | POA: Diagnosis not present

## 2016-10-12 DIAGNOSIS — C7981 Secondary malignant neoplasm of breast: Secondary | ICD-10-CM | POA: Diagnosis not present

## 2016-10-12 DIAGNOSIS — N183 Chronic kidney disease, stage 3 (moderate): Secondary | ICD-10-CM | POA: Diagnosis not present

## 2016-10-12 DIAGNOSIS — M519 Unspecified thoracic, thoracolumbar and lumbosacral intervertebral disc disorder: Secondary | ICD-10-CM | POA: Diagnosis not present

## 2016-10-14 DIAGNOSIS — Z483 Aftercare following surgery for neoplasm: Secondary | ICD-10-CM | POA: Diagnosis not present

## 2016-10-14 DIAGNOSIS — M519 Unspecified thoracic, thoracolumbar and lumbosacral intervertebral disc disorder: Secondary | ICD-10-CM | POA: Diagnosis not present

## 2016-10-14 DIAGNOSIS — C7981 Secondary malignant neoplasm of breast: Secondary | ICD-10-CM | POA: Diagnosis not present

## 2016-10-14 DIAGNOSIS — N183 Chronic kidney disease, stage 3 (moderate): Secondary | ICD-10-CM | POA: Diagnosis not present

## 2016-10-14 DIAGNOSIS — D649 Anemia, unspecified: Secondary | ICD-10-CM | POA: Diagnosis not present

## 2016-10-14 DIAGNOSIS — I129 Hypertensive chronic kidney disease with stage 1 through stage 4 chronic kidney disease, or unspecified chronic kidney disease: Secondary | ICD-10-CM | POA: Diagnosis not present

## 2016-10-16 DIAGNOSIS — D649 Anemia, unspecified: Secondary | ICD-10-CM | POA: Diagnosis not present

## 2016-10-16 DIAGNOSIS — M519 Unspecified thoracic, thoracolumbar and lumbosacral intervertebral disc disorder: Secondary | ICD-10-CM | POA: Diagnosis not present

## 2016-10-16 DIAGNOSIS — Z483 Aftercare following surgery for neoplasm: Secondary | ICD-10-CM | POA: Diagnosis not present

## 2016-10-16 DIAGNOSIS — N183 Chronic kidney disease, stage 3 (moderate): Secondary | ICD-10-CM | POA: Diagnosis not present

## 2016-10-16 DIAGNOSIS — I129 Hypertensive chronic kidney disease with stage 1 through stage 4 chronic kidney disease, or unspecified chronic kidney disease: Secondary | ICD-10-CM | POA: Diagnosis not present

## 2016-10-16 DIAGNOSIS — C7981 Secondary malignant neoplasm of breast: Secondary | ICD-10-CM | POA: Diagnosis not present

## 2016-10-22 ENCOUNTER — Ambulatory Visit (HOSPITAL_BASED_OUTPATIENT_CLINIC_OR_DEPARTMENT_OTHER): Payer: Medicare Other | Admitting: Hematology and Oncology

## 2016-10-22 ENCOUNTER — Encounter: Payer: Self-pay | Admitting: Hematology and Oncology

## 2016-10-22 ENCOUNTER — Telehealth: Payer: Self-pay | Admitting: Hematology and Oncology

## 2016-10-22 DIAGNOSIS — Z170 Estrogen receptor positive status [ER+]: Secondary | ICD-10-CM | POA: Diagnosis not present

## 2016-10-22 DIAGNOSIS — C7931 Secondary malignant neoplasm of brain: Secondary | ICD-10-CM

## 2016-10-22 DIAGNOSIS — C50412 Malignant neoplasm of upper-outer quadrant of left female breast: Secondary | ICD-10-CM

## 2016-10-22 NOTE — Assessment & Plan Note (Signed)
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 TCH-Perjeta 6 cycles started 12/31/2014 completed 04/22/2015 (decreased chemotherapy dosage with cycle 2 and discontinued Perjeta for diarrhea) 2. Left mastectomy 05/30/2015: CR to chemo, 0/6 LN  Did not need radiation based upon tumor board recommendation 3. Herceptin maintenance completed May 2017 4. Brain metastases diagnosed November 2017  Treatment plan:  1. Stereotactic radiosurgery by Dr. Tammi Klippel 08/13/16 to 08/24/16 2. Lapatinib with anastrozole started 09/08/2016  Lapatinib toxicities:  Return to clinic in 3 months for follow-up

## 2016-10-22 NOTE — Telephone Encounter (Signed)
Gave patient avs report and appointments for June.  °

## 2016-10-22 NOTE — Progress Notes (Signed)
Patient Care Team: [name], MD as PCP - General  DIAGNOSIS:  Encounter Diagnosis  Name Primary?  . Malignant neoplasm of upper-outer quadrant of left breast in female, estrogen receptor positive ([place])     SUMMARY OF ONCOLOGIC HISTORY:   Breast cancer of upper-outer quadrant of left female breast ([place])   12/07/2014 Initial Diagnosis    Left breast 2:00 position: Invasive ductal carcinoma grade 2, ER 5%, PR 0%, Ki-67 80%, HER-2 positive ratio 6.96      12/07/2014 Mammogram    Large left breast mass 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, 2 small benign nodules in the right breast stable since 2002, other consistent with normal intramammary lymph node left axilla ultrasound normal-sized lymph nodes      12/31/2014 - 04/22/2015 Neo-Adjuvant Chemotherapy    TCH Perjeta neoadjuvant chemotherapy 6 (decreased chemotherapy dosage with cycle 2 and discontinued Perjeta for diarrhea)       04/29/2015 Breast MRI    Significant interval decrease in size of the mass, continued skin retraction, 4.3 x 2 x 1.8 cm (previously 4.9 x 3.9 x 3.9 cm)      05/30/2015 Surgery    Left mastectomy: Path CR, 0/6 LN Negative      09/09/2015 -  Anti-estrogen oral therapy    Anastrozole 1 mg daily, Lapatinib added 09/09/15      07/09/2016 Imaging    Patient presented in the emergency department after a fall at home without help for 24 hours, CT head revealed 3.9 x 3.5 cm cystic mass anterior right temporal lobe with vasogenic edema, MRI revealed 4.9 cm mass      07/09/2016 - 07/16/2016 Hospital Admission    Admitted with brain metastases after a fall at home, rhabdomyolysis       07/13/2016 Surgery    Craniotomy by Dr.Ditty: Metastatic breast cancer ER 40%, PR 0%, HER-2 positive; postcraniotomy MRI showed residual enhancement along the anterior and superior margin, stable 1.3 cm left parietal lobe met.      08/11/2016 Imaging    Interval progression of Rt temporal mets 3.2 cm, Lt parietal intra axial mets 1 cm  size       08/13/2016 - 08/24/2016 Radiation Therapy    SRS Rt Temporal and Left Parietal lobes       CHIEF COMPLIANT: Follow-up after radiation therapy to the brain  INTERVAL HISTORY: Savannah Howard is a 71 year old with metastatic breast cancer with brain metastases who underwent craniotomy followed by stereotactic radiosurgery. She is now able to walk with the help of a cane and even some steps. Physical therapy has signed off. She is able to do her activities of daily living. She did not start lapatinib because she is anxious and worried about side effects as well as the cost of the drug was $9000 a month.  REVIEW OF SYSTEMS:   Constitutional: Denies fevers, chills or abnormal weight loss Eyes: Denies blurriness of vision Ears, nose, mouth, throat, and face: Denies mucositis or sore throat Respiratory: Denies cough, dyspnea or wheezes Cardiovascular: Denies palpitation, chest discomfort Gastrointestinal:  Denies nausea, heartburn or change in bowel habits Skin: Denies abnormal skin rashes Lymphatics: Denies new lymphadenopathy or easy bruising Neurological: Numbness in the lower extremities, tremors, weakness Behavioral/Psych: Mood is stable, no new changes  Extremities: No lower extremity edema Breast:  denies any pain or lumps or nodules in either breasts All other systems were reviewed with the patient and are negative.  I have reviewed the past medical history, past  surgical history, social history and family history with the patient and they are unchanged from previous note.  ALLERGIES:  is allergic to penicillins and sulfonamide derivatives.  MEDICATIONS:  Current Outpatient Prescriptions  Medication Sig Dispense Refill  . acetaminophen (TYLENOL) 500 MG tablet Take 1,000 mg by mouth at bedtime.     Marland Kitchen anastrozole (ARIMIDEX) 1 MG tablet TAKE 1 TABLET (1 MG TOTAL) BY MOUTH DAILY. 90 tablet 2  . cyanocobalamin (,VITAMIN B-12,) 1000 MCG/ML injection Inject 1,000 mcg into the  muscle every 30 (thirty) days.    Marland Kitchen dexamethasone (DECADRON) 2 MG tablet Take 1 mg by mouth 4 (four) times daily.     Marland Kitchen docusate sodium (COLACE) 100 MG capsule Take 1 capsule (100 mg total) by mouth 2 (two) times daily. (Patient not taking: Reported on 09/28/2016) 10 capsule 0  . HYDROcodone-acetaminophen (NORCO/VICODIN) 5-325 MG tablet Take 1 tablet by mouth every 6 (six) hours as needed for moderate pain.    . lapatinib (TYKERB) 250 MG tablet Take 4 tablets (1,000 mg total) by mouth daily. (Patient not taking: Reported on 09/28/2016) 120 tablet 6  . levETIRAcetam (KEPPRA) 750 MG tablet Take 1 tablet (750 mg total) by mouth 2 (two) times daily. (Patient not taking: Reported on 09/28/2016)    . LORazepam (ATIVAN) 0.5 MG tablet TAKE 1 TABLET BY MOUTH 3 TIMES A DAY AS NEEDED FOR ANXIETY 270 tablet 0  . losartan-hydrochlorothiazide (HYZAAR) 100-25 MG tablet TAKE 1 TABLET BY MOUTH EVERY DAY 90 tablet 0  . metoprolol succinate (TOPROL-XL) 25 MG 24 hr tablet TAKE 1 TABLET (25 MG TOTAL) BY MOUTH DAILY. (Patient not taking: Reported on 09/28/2016) 30 tablet 1  . omeprazole (PRILOSEC) 20 MG capsule Take 20 mg by mouth daily.    . potassium chloride (MICRO-K) 10 MEQ CR capsule TAKE ONE CAPSULE BY MOUTH EVERY DAY 90 capsule 0  . terazosin (HYTRIN) 5 MG capsule Take 5 mg by mouth at bedtime. Do not crush    . vitamin B-12 (CYANOCOBALAMIN) 1000 MCG tablet Take 1,000 mcg by mouth daily.     No current facility-administered medications for this visit.     PHYSICAL EXAMINATION: ECOG PERFORMANCE STATUS: 1 - Symptomatic but completely ambulatory  Vitals:   10/22/16 0944  BP: 119/77  Pulse: 97  Resp: 18  Temp: 97.5 F (36.4 C)   Filed Weights   10/22/16 0944  Weight: 205 lb (93 kg)    GENERAL:alert, no distress and comfortable SKIN: skin color, texture, turgor are normal, no rashes or significant lesions EYES: normal, Conjunctiva are pink and non-injected, sclera clear OROPHARYNX:no exudate, no erythema  and lips, buccal mucosa, and tongue normal  NECK: supple, thyroid normal size, non-tender, without nodularity LYMPH:  no palpable lymphadenopathy in the cervical, axillary or inguinal LUNGS: clear to auscultation and percussion with normal breathing effort HEART: regular rate & rhythm and no murmurs and no lower extremity edema ABDOMEN:abdomen soft, non-tender and normal bowel sounds MUSCULOSKELETAL:no cyanosis of digits and no clubbing  NEURO: alert & oriented x 3 with fluent speech, 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 EXTREMITIES: No lower extremity edema LABORATORY DATA:  I have reviewed the data as listed   Chemistry      Component Value Date/Time   NA 136 (A) 07/20/2016   NA 141 10/07/2015 0843   K 3.4 07/20/2016   K 3.4 (L) 10/07/2015 0843   CL 102 07/14/2016 0530   CO2 24 07/14/2016 0530   CO2  22 10/07/2015 0843   BUN 44.1 (H) 08/13/2016 1422   CREATININE 1.0 08/13/2016 1422   GLU 115 07/20/2016      Component Value Date/Time   CALCIUM 8.1 (L) 07/14/2016 0530   CALCIUM 10.2 10/07/2015 0843   ALKPHOS 68 07/09/2016 2339   ALKPHOS 76 10/07/2015 0843   AST 71 (H) 07/09/2016 2339   AST 13 10/07/2015 0843   ALT 22 07/09/2016 2339   ALT 13 10/07/2015 0843   BILITOT 0.9 07/09/2016 2339   BILITOT 0.35 10/07/2015 0843       Lab Results  Component Value Date   WBC 14.1 07/20/2016   HGB 10.9 (A) 07/20/2016   HCT 33 (A) 07/20/2016   MCV 94.7 07/14/2016   PLT 264 07/20/2016   NEUTROABS 13.5 (H) 07/09/2016    ASSESSMENT & PLAN:  Breast cancer of upper-outer quadrant of left female breast ([place]) 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 TCH-Perjeta 6 cycles started 12/31/2014 completed 04/22/2015 (decreased chemotherapy dosage with cycle 2 and  discontinued Perjeta for diarrhea) 2. Left mastectomy 05/30/2015: CR to chemo, 0/6 LN  Did not need radiation based upon tumor board recommendation 3. Herceptin maintenance completed May 2017 4. Brain metastases diagnosed November 2017  Treatment plan:  1. Stereotactic radiosurgery by Dr. Tammi Klippel 08/13/16 to 08/24/16 2. Anastrozole started 09/08/2016 (ratio was prescribed lapatinib but because it causes $9000 she decided not to take it, also she does not want to take it because she is anxious about the side effects and her inability to take care of herself if she were to start to have diarrhea.)  I sent a message to Dr. Tammi Klippel regarding follow-up brain MRI Return to clinic in 3 months for follow-up   I spent 25 minutes talking to the patient of which more than half was spent in counseling and coordination of care.  No orders of the defined types were placed in this encounter.  The patient has a good understanding of the overall plan. she agrees with it. she will call with any problems that may develop before the next visit here.   Rulon Eisenmenger, MD 10/22/16

## 2016-10-28 ENCOUNTER — Encounter: Payer: Self-pay | Admitting: *Deleted

## 2016-10-28 NOTE — Progress Notes (Signed)
[facility] request for refill on metoprolol succ back to [facility] per Shona Simpson, P[facility] Store [phone].  Mrs. Shellsea made aware that she has a refill on her metoprolol at [facility].

## 2016-10-29 ENCOUNTER — Other Ambulatory Visit: Payer: Self-pay | Admitting: Radiation Therapy

## 2016-10-29 DIAGNOSIS — C7931 Secondary malignant neoplasm of brain: Secondary | ICD-10-CM

## 2016-10-29 DIAGNOSIS — C7949 Secondary malignant neoplasm of other parts of nervous system: Principal | ICD-10-CM

## 2016-11-11 NOTE — Progress Notes (Addendum)
Savannah Howard. Ercole 71 y.o. woman with Secondary malignant neoplasm of brain completed radiation 08-27-16 review 11-12-16 MRI brain w wo contrast  two month FU.   Headache:Yes,occasional one or two Pain: Dizziness:None Nausea/Vomiting/Ataxia:Occasional with weakness less than five minutes Visional changes(Blurred/Diplopia (double vision), blind spots and peripheral vision changes):Wears glasses,no, plans to have eye checked for routine check, wears contacts Ring in ears:None Fatigue:Has improved using walker and doing free walking in her apartment. Cognitive changes: Report she does not feel she has any memory problems.                                       Weight: Wt Readings from Last 3 Encounters:  11/16/16 197 lb (89.4 kg)  10/22/16 205 lb (93 kg)  09/28/16 205 lb 12.8 oz (93.4 kg)   Appetite: Eating reports food does not take good, eating three meals per day. Imaging:11-12-16 MTR brain w wo contrast Lab:N/A 10-22-16 Saw Dr. Lindi Adie on Anastrozole started in January 08-2016 BP (!) 143/65   Pulse 82   Temp 97.8 F (36.6 C) (Oral)   Resp 16   Ht 5\' 2"  (1.575 m)   Wt 197 lb (89.4 kg)   SpO2 100%   BMI 36.03 kg/m

## 2016-11-12 ENCOUNTER — Ambulatory Visit
Admission: RE | Admit: 2016-11-12 | Discharge: 2016-11-12 | Disposition: A | Discharge status: Home / Self Care | Payer: Medicare Other | Source: Ambulatory Visit | Attending: Radiation Oncology | Admitting: Radiation Oncology

## 2016-11-12 DIAGNOSIS — C7949 Secondary malignant neoplasm of other parts of nervous system: Principal | ICD-10-CM

## 2016-11-12 DIAGNOSIS — C7931 Secondary malignant neoplasm of brain: Secondary | ICD-10-CM | POA: Diagnosis not present

## 2016-11-12 MED ORDER — GADOBENATE DIMEGLUMINE 529 MG/ML IV SOLN
20.0000 mL | Freq: Once | INTRAVENOUS | Status: AC | PRN
  Administered 2016-11-12: 20 mL via INTRAVENOUS

## 2016-11-16 ENCOUNTER — Ambulatory Visit
Admission: RE | Admit: 2016-11-16 | Discharge: 2016-11-16 | Disposition: A | Discharge status: Home / Self Care | Payer: Medicare Other | Source: Ambulatory Visit | Attending: Radiation Oncology | Admitting: Radiation Oncology

## 2016-11-16 ENCOUNTER — Ambulatory Visit (HOSPITAL_BASED_OUTPATIENT_CLINIC_OR_DEPARTMENT_OTHER)
Admission: RE | Admit: 2016-11-16 | Discharge: 2016-11-16 | Disposition: A | Discharge status: Home / Self Care | Payer: Medicare Other | Source: Ambulatory Visit | Attending: Radiation Oncology | Admitting: Radiation Oncology

## 2016-11-16 ENCOUNTER — Encounter: Payer: Self-pay | Admitting: Radiation Oncology

## 2016-11-16 VITALS — BP 143/65 | HR 82 | Temp 97.8°F | Resp 16 | Ht 62.0 in | Wt 197.0 lb

## 2016-11-16 DIAGNOSIS — E669 Obesity, unspecified: Secondary | ICD-10-CM | POA: Diagnosis not present

## 2016-11-16 DIAGNOSIS — C7931 Secondary malignant neoplasm of brain: Secondary | ICD-10-CM

## 2016-11-16 DIAGNOSIS — E538 Deficiency of other specified B group vitamins: Secondary | ICD-10-CM | POA: Diagnosis not present

## 2016-11-16 DIAGNOSIS — F419 Anxiety disorder, unspecified: Secondary | ICD-10-CM | POA: Insufficient documentation

## 2016-11-16 DIAGNOSIS — C50919 Malignant neoplasm of unspecified site of unspecified female breast: Secondary | ICD-10-CM | POA: Diagnosis not present

## 2016-11-16 DIAGNOSIS — Z853 Personal history of malignant neoplasm of breast: Secondary | ICD-10-CM | POA: Insufficient documentation

## 2016-11-16 DIAGNOSIS — M5136 Other intervertebral disc degeneration, lumbar region: Secondary | ICD-10-CM | POA: Insufficient documentation

## 2016-11-16 DIAGNOSIS — R569 Unspecified convulsions: Secondary | ICD-10-CM | POA: Insufficient documentation

## 2016-11-16 DIAGNOSIS — Z825 Family history of asthma and other chronic lower respiratory diseases: Secondary | ICD-10-CM | POA: Diagnosis not present

## 2016-11-16 DIAGNOSIS — Z880 Allergy status to penicillin: Secondary | ICD-10-CM | POA: Insufficient documentation

## 2016-11-16 DIAGNOSIS — Z6836 Body mass index (BMI) 36.0-36.9, adult: Secondary | ICD-10-CM | POA: Insufficient documentation

## 2016-11-16 DIAGNOSIS — C712 Malignant neoplasm of temporal lobe: Secondary | ICD-10-CM

## 2016-11-16 DIAGNOSIS — Z9012 Acquired absence of left breast and nipple: Secondary | ICD-10-CM | POA: Insufficient documentation

## 2016-11-16 DIAGNOSIS — Z515 Encounter for palliative care: Secondary | ICD-10-CM

## 2016-11-16 DIAGNOSIS — E559 Vitamin D deficiency, unspecified: Secondary | ICD-10-CM | POA: Diagnosis not present

## 2016-11-16 DIAGNOSIS — Z9221 Personal history of antineoplastic chemotherapy: Secondary | ICD-10-CM | POA: Insufficient documentation

## 2016-11-16 DIAGNOSIS — Z510 Encounter for antineoplastic radiation therapy: Secondary | ICD-10-CM | POA: Insufficient documentation

## 2016-11-16 DIAGNOSIS — I10 Essential (primary) hypertension: Secondary | ICD-10-CM | POA: Diagnosis not present

## 2016-11-16 DIAGNOSIS — Z7189 Other specified counseling: Secondary | ICD-10-CM | POA: Diagnosis not present

## 2016-11-16 DIAGNOSIS — Z87442 Personal history of urinary calculi: Secondary | ICD-10-CM | POA: Insufficient documentation

## 2016-11-16 DIAGNOSIS — Z882 Allergy status to sulfonamides status: Secondary | ICD-10-CM | POA: Insufficient documentation

## 2016-11-16 DIAGNOSIS — Z923 Personal history of irradiation: Secondary | ICD-10-CM | POA: Insufficient documentation

## 2016-11-16 DIAGNOSIS — C50912 Malignant neoplasm of unspecified site of left female breast: Secondary | ICD-10-CM

## 2016-11-16 DIAGNOSIS — Z79899 Other long term (current) drug therapy: Secondary | ICD-10-CM | POA: Diagnosis not present

## 2016-11-16 DIAGNOSIS — K219 Gastro-esophageal reflux disease without esophagitis: Secondary | ICD-10-CM | POA: Insufficient documentation

## 2016-11-16 DIAGNOSIS — Z8249 Family history of ischemic heart disease and other diseases of the circulatory system: Secondary | ICD-10-CM | POA: Diagnosis not present

## 2016-11-16 DIAGNOSIS — Z79811 Long term (current) use of aromatase inhibitors: Secondary | ICD-10-CM | POA: Diagnosis not present

## 2016-11-16 NOTE — Consult Note (Signed)
Consultation Note Date: 11/16/2016   Patient Name: Savannah Howard  DOB: 1945-10-23  MRN: [id number]  Age / Sex: 71 y.o., female  PCP: [name], MD Referring Physician: Tyler Pita, MD  Reason for Consultation: Establishing goals of care and Psychosocial/spiritual support  HPI/Patient Profile: 71 y.o. female  with past medical history:   SUMMARY OF ONCOLOGIC HISTORY:  Per Dr Arnoldo Lenis office note       Breast cancer of upper-outer quadrant of left female breast ([place])   12/07/2014 Initial Diagnosis    Left breast 2:00 position: Invasive ductal carcinoma grade 2, ER 5%, PR 0%, Ki-67 80%, HER-2 positive ratio 6.96      12/07/2014 Mammogram    Large left breast mass 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, 2 small benign nodules in the right breast stable since 2002, other consistent with normal intramammary lymph node left axilla ultrasound normal-sized lymph nodes      12/31/2014 - 04/22/2015 Neo-Adjuvant Chemotherapy    TCH Perjeta neoadjuvant chemotherapy 6 (decreased chemotherapy dosage with cycle 2 and discontinued Perjeta for diarrhea)       04/29/2015 Breast MRI    Significant interval decrease in size of the mass, continued skin retraction, 4.3 x 2 x 1.8 cm (previously 4.9 x 3.9 x 3.9 cm)      05/30/2015 Surgery    Left mastectomy: Path CR, 0/6 LN Negative      09/09/2015 -  Anti-estrogen oral therapy    Anastrozole 1 mg daily, Lapatinib added 09/09/15      07/09/2016 Imaging    Patient presented in the emergency department after a fall at home without help for 24 hours, CT head revealed 3.9 x 3.5 cm cystic mass anterior right temporal lobe with vasogenic edema, MRI revealed 4.9 cm mass      07/09/2016 - 07/16/2016 Hospital Admission    Admitted with brain metastases after a fall at home, rhabdomyolysis       07/13/2016 Surgery    Craniotomy by  Dr.Ditty: Metastatic breast cancer ER 40%, PR 0%, HER-2 positive; postcraniotomy MRI showed residual enhancement along the anterior and superior margin, stable 1.3 cm left parietal lobe met.      08/11/2016 Imaging    Interval progression of Rt temporal mets 3.2 cm, Lt parietal intra axial mets 1 cm size       08/13/2016 - 08/24/2016 Radiation Therapy    SRS Rt Temporal and Left Parietal lobes       INTERVAL HISTORY: Savannah Howard is a 71 year old with metastatic breast cancer with brain metastases who underwent craniotomy followed by stereotactic radiosurgery. She is now able to walk with the help of a cane and even some steps. Physical therapy has signed off. She is able to do her activities of daily living. She did not start lapatinib because she is anxious and worried about side effects as well as the cost of the drug was $9000 a month.  Diagnosis:   Recurrent metastatic ER positive, HER2 amplified invasive ductal carcinoma of the left  breast with disease in the brain.     Clinical Assessment and Goals of Care:  I meet Savannah Howard in the OP Radiation-oncology clinic this morning   To introduce the concept of Palliative Medicine into a holistic treatment plan and to discuss diagnosis, prognosis, GOC, EOL wishes disposition and options.  A discussion was had today regarding advanced directives.  Concepts specific to code status, artifical feeding and hydration, continued IV antibiotics and rehospitalization was had.  The difference between a aggressive medical intervention path  and a palliative comfort care path for this patient at this time was had.  Values and goals of care important to patient were attempted to be elicited.  Allowed for space and time for Savannah Howard to share the difficulties both physical and emotional of living with terminal illness.  Today she reports her symptoms are managed and she "feels pretty good"  MOST form was introduced    Concept of Hospice  and Palliative Care were discussed   Questions and concerns addressed.   Family encouraged to call with questions or concerns.  PMT will continue to support holistically.   HCPOA/friend/Jeannie Laurance Flatten --Savannah Howard tells me she has Advanced Directives completed, I encouraged her to bring them to her next appointment to have them scanned into her chart    SUMMARY OF RECOMMENDATIONS    Code Status/Advance Care Planning:  Full code  Palliative Prophylaxis:   Bowel Regimen- offered education regarding SE of opioids being  Constipation.  We discussed use of OTC medication and diet and hydration impact on prevention of contipation   Psycho-social/Spiritual:   Today Savannah Howard shared her life story.  She spoke to her family and friends.    She expressed appreciation for meeting today with PMT and is education on how to contact us with future needs or questions          Prognosis:   Unable to determine     I have reviewed the medical record, interviewed the patient and family, and examined the patient. The following aspects are pertinent.  Past Medical History:  Diagnosis Date  . Allergy    rhinitis  . Anemia   . Anxiety   . Brain cancer ([place])    breast ca with mets to brain  . Breast cancer ([place])   . DDD (degenerative disc disease), lumbar dx'd in 1980's  . GERD (gastroesophageal reflux disease)   . Heart murmur   . History of blood transfusion 2016 X 3 (05/30/2015)  . Hypertension   . Kidney stone 1990's X 1   "passed it"  . Obesity   . Primary cancer of upper outer quadrant of left breast ([place]) dx'd 03/2015  . Vitamin B 12 deficiency   . Vitamin D deficiency    Social History   Social History  . Marital status: Single    Spouse name: N/A  . Number of children: N/A  . Years of education: N/A   Occupational History  . 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 Smoking status: Never Smoker  . Smokeless tobacco: Never Used  . Alcohol use Yes      Comment: 05/30/2015 "I might take a drink of wine 1-2 times/yr"  . Drug use: No  . Sexual activity: Not Currently   Other Topics Concern  . Not on file   Social History Narrative   Regular exercise- no   Family History  Problem Relation Age of Onset  . Heart disease Mother  CAD  . COPD Mother   . Heart disease Father 62    leaky valve  . Parkinsonism Father   . Hypertension Other    Scheduled Meds: Continuous Infusions: PRN Meds:. Medications Prior to Admission:  Prior to Admission medications   Medication Sig Start Date End Date Taking? Authorizing Provider  acetaminophen (TYLENOL) 500 MG tablet Take 1,000 mg by mouth at bedtime.     Historical Provider, MD  anastrozole (ARIMIDEX) 1 MG tablet TAKE 1 TABLET (1 MG TOTAL) BY MOUTH DAILY. 08/20/16   Nicholas Lose, MD  cyanocobalamin (,VITAMIN B-12,) 1000 MCG/ML injection Inject 1,000 mcg into the muscle every 30 (thirty) days.    Historical Provider, MD  levETIRAcetam (KEPPRA) 750 MG tablet Take 1 tablet (750 mg total) by mouth 2 (two) times daily. Patient not taking: Reported on 11/16/2016 07/16/16   Thurnell Lose, MD  LORazepam (ATIVAN) 0.5 MG tablet TAKE 1 TABLET BY MOUTH 3 TIMES A DAY AS NEEDED FOR ANXIETY 08/28/16   Aleksei [name] V, MD  losartan-hydrochlorothiazide (HYZAAR) 100-25 MG tablet TAKE 1 TABLET BY MOUTH EVERY DAY 08/21/16   Aleksei [name] V, MD  metoprolol succinate (TOPROL-XL) 25 MG 24 hr tablet TAKE 1 TABLET (25 MG TOTAL) BY MOUTH DAILY. 08/20/16   Jolaine Artist, MD  omeprazole (PRILOSEC) 20 MG capsule Take 20 mg by mouth daily.    Historical Provider, MD  potassium chloride (MICRO-K) 10 MEQ CR capsule TAKE ONE CAPSULE BY MOUTH EVERY DAY 08/21/16   Aleksei [name] V, MD  terazosin (HYTRIN) 5 MG capsule Take 5 mg by mouth at bedtime. Do not crush    Historical Provider, MD  vitamin B-12 (CYANOCOBALAMIN) 1000 MCG tablet Take 1,000 mcg by mouth daily.    Historical Provider, MD   Allergies  Allergen  Reactions  . Penicillins Other (See Comments)    REACTION: genital swelling and skin peeled ? yeast infection ?  . Sulfonamide Derivatives Other (See Comments)    convulsions    Review of Systems  Constitutional: Positive for fatigue.  Neurological: Positive for weakness.    Physical Exam  Constitutional: She is oriented to person, place, and time. She appears well-developed.  HENT:  Mouth/Throat: Oropharynx is clear and moist.  Neurological: She is alert and oriented to person, place, and time.  Skin: Skin is warm and dry.    Vital Signs: There were no vitals taken for this visit.         SpO2:   O2 Device:  O2 Flow Rate: .   IO: Intake/output summary: No intake or output data in the 24 hours ending 11/16/16 1517  LBM:   Baseline Weight:   Most recent weight:       Palliative Assessment/Data: 70 %   Discussed with Worthy Flank PA-C  Time In: 1015 Time Out: 1130 Time Total: 75 minutes Greater than 50%  of this time was spent counseling and coordinating care related to the above assessment and plan.  Signed by: Wadie Lessen, NP   Please contact Palliative Medicine Team phone at [phone] for questions and concerns.  For individual provider: See Shea Evans

## 2016-11-17 NOTE — Progress Notes (Signed)
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    Interval Since Last Radiation: 3 months.   08/13/16-08/24/16 Postop SRS Treatment: 1. PTV Right Temporal/ 25 Gy in 5 fractions 2. PTV Left Parietal/ 20 Gy in 1 fraction  Narrative: The patient has a history of metastatic ER positive breast cancer. She has continued on estrogen blockade for her cancer as of late. She received surgical resection of the larger lesion, and postop radiotherapy for her brain disease about 3 months ago. She comes today for her first surveillance visit. Her MRI of the brain on 11/12/16 reveals improvement in size of the previously treated left parietal lesion, as well as the right temporal lobe lesion. However, the right temporal lesion did reveal increased enhancement along the anteroinferior margin. There was also question about two areas of enhancement as well in the right parietal and right temporal lobes 56m and 766m            On review of systems, the patient reports that she is doing well overall. She denies any chest pain, shortness of breath, cough, fevers, chills, night sweats, unintended weight changes. She denies any bowel or bladder disturbances, and denies abdominal pain, nausea or vomiting. she denies any new musculoskeletal or joint aches or pains, new skin lesions or concerns. She denies any headaches, changes in auditory or visual acuity, weakness, or new neurologic compalints. A complete review of systems is obtained and is otherwise negative.  Past Medical History:  Past Medical History:  Diagnosis Date  . Allergy    rhinitis  . Anemia   . Anxiety   . Brain cancer ([facility]   breast ca with mets  to brain  . Breast cancer ([facility]  . DDD (degenerative disc disease), lumbar dx'd in 1980's  . GERD (gastroesophageal reflux disease)   . Heart murmur   . History of blood transfusion 2016 X 3 (05/30/2015)  . Hypertension   . Kidney stone 1990's X 1   "passed it"  . Obesity   . Primary cancer of upper outer quadrant of left breast (HCSunrisedx'd 03/2015  . Vitamin B 12 deficiency   . Vitamin D deficiency     Past Surgical History: Past Surgical History:  Procedure Laterality Date  . APPLICATION OF CRANIAL NAVIGATION N/A 07/13/2016   Procedure: APPLICATION OF CRANIAL NAVIGATION;  Surgeon: BeKevan Nyitty, MD;  [facility];  Laterality: N/A;  . BREAST BIOPSY Left ~ 12/2014  . CHOLECYSTECTOMY OPEN  ~ 1975  . ESOPHAGEAL DILATION  X 3  . MASTECTOMY COMPLETE / SIMPLE W/ SENTINEL NODE BIOPSY Left 05/30/2015  . MASTECTOMY W/ SENTINEL NODE BIOPSY Left 05/30/2015   Procedure: LEFT MASTECTOMY WITH SENTINEL LYMPH NODE BIOPSY;  Surgeon: PaAutumn MessingII, MD;  [facility];  Laterality: Left;  . PORTACATH PLACEMENT Right 12/26/2014   Procedure: INSERTION PORT-A-CATH;  Surgeon: PaAutumn MessingII, MD;  [place] [facility] Service: General;  Laterality: Right;  . PR DURAL GRAFT REPAIR,SPINE DEFECT N/A 07/13/2016   Procedure: FRMarshia LyTERIOTACTIC BIOPSY WITH STEALTH;  Surgeon: BeKevan Nyitty, MD;  [facility];  Laterality: N/A;    Social History:  Social History   Social History  . Marital status: Single    Spouse name: N/A  . Number of children: N/A  . Years of education: N/A   Occupational History  . 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 Smoking status: Never Smoker  . Smokeless tobacco: Never Used  . Alcohol use Yes     Comment: 05/30/2015 "I might take a drink of wine 1-2 times/yr"  . Drug use: No  . Sexual activity: Not Currently   Other Topics Concern  . Not on file     Social History Narrative   Regular exercise- no  The patient is single. She is no longer working due to her illness but used to work at a [employer].  Family History: Family History  Problem Relation Age of Onset  . Heart disease Mother     CAD  . COPD Mother   . Heart disease Father 67    leaky valve  . Parkinsonism Father   . Hypertension Other     ALLERGIES:  is allergic to penicillins and sulfonamide derivatives.  Meds: Current Outpatient Prescriptions  Medication Sig Dispense Refill  . acetaminophen (TYLENOL) 500 MG tablet Take 1,000 mg by mouth at bedtime.     Marland Kitchen anastrozole (ARIMIDEX) 1 MG tablet TAKE 1 TABLET (1 MG TOTAL) BY MOUTH DAILY. 90 tablet 2  . cyanocobalamin (,VITAMIN B-12,) 1000 MCG/ML injection Inject 1,000 mcg into the muscle every 30 (thirty) days.    Marland Kitchen LORazepam (ATIVAN) 0.5 MG tablet TAKE 1 TABLET BY MOUTH 3 TIMES A DAY AS NEEDED FOR ANXIETY 270 tablet 0  . losartan-hydrochlorothiazide (HYZAAR) 100-25 MG tablet TAKE 1 TABLET BY MOUTH EVERY DAY 90 tablet 0  . metoprolol succinate (TOPROL-XL) 25 MG 24 hr tablet TAKE 1 TABLET (25 MG TOTAL) BY MOUTH DAILY. 30 tablet 1  . omeprazole (PRILOSEC) 20 MG capsule Take 20 mg by mouth daily.    . potassium chloride (MICRO-K) 10 MEQ CR capsule TAKE ONE CAPSULE BY MOUTH EVERY DAY 90 capsule 0  . terazosin (HYTRIN) 5 MG capsule Take 5 mg by mouth at bedtime. Do not crush    . vitamin B-12 (CYANOCOBALAMIN) 1000 MCG tablet Take 1,000 mcg by mouth daily.    Marland Kitchen levETIRAcetam (KEPPRA) 750 MG tablet Take 1 tablet (750 mg total) by mouth 2 (two) times daily. (Patient not taking: Reported on 11/16/2016)     No current facility-administered medications for this encounter.     Physical Findings:  height is 5' 2" (1.575 m) and weight is 197 lb (89.4 kg). Her oral temperature is 97.8 F (36.6 C). Her blood pressure is 143/65 (abnormal) and her pulse is 82. Her respiration is 16 and oxygen saturation is 100%.  Pain  Assessment Pain Score: 3  (Low back)/10 In general this is a well appearing Caucasian female in no acute distress. She's alert and oriented x4 and appropriate throughout the examination. Cardiopulmonary assessment is negative for acute distress and she exhibits normal effort. HEENT reveals that she is normocephalic, atraumatic. EOMs are intact. Her moon facies have nearly resolved since her last visit. She appears to be intact grossly from a neurologic perspective.  Lab Findings: Lab Results  Component Value Date   WBC 14.1 07/20/2016   HGB 10.9 (A) 07/20/2016   HCT 33 (A) 07/20/2016   MCV 94.7 07/14/2016   PLT 264 07/20/2016     Radiographic Findings: Mr Jeri Cos  Wo Contrast  Result Date: 11/12/2016 CLINICAL DATA:  Left upper outer quadrant breast cancer with brain metastases status post craniotomy in 07/2016 and SRS in 08/2016. Creatinine was obtained on site at [facility] at [address] Results: Creatinine 0.9 mg/dL. EXAM: MRI HEAD WITHOUT AND WITH CONTRAST TECHNIQUE: Multiplanar, multiecho pulse sequences of the brain and surrounding structures were obtained without and with intravenous contrast. CONTRAST:  52m MULTIHANCE GADOBENATE DIMEGLUMINE 529 MG/ML IV SOLN COMPARISON:  08/11/2016 FINDINGS: Brain: There is no evidence of acute infarct or midline shift. The ventricles are normal in size. Numerous small foci of cerebral white matter T2 hyperintensity are unchanged and nonspecific but compatible with mild-to-moderate chronic small vessel ischemic disease. An incidental right frontal developmental venous anomaly is again noted. Sequelae of right pterional craniotomy are again identified. There is a small extra-axial collection subjacent to the craniotomy which has decreased in size. Dural thickening in this region is most likely postoperative. Thick walled, partially nodular enhancement at the right temporal lobe resection site overall appears improved, particularly  posteriorly. The resection cavity has partially collapsed in the interim, and the total cross-sectional measurements of the enhancing region are 28 x 17 x 18 mm (previously 32 x 24 x 22 mm). Enhancement at the anteroinferior margin has slightly increased (series 13, image 68 and series 15, image 8). There is mild surrounding edema which has significantly increased. The superficial left parietal lesion has decreased in size, now measuring 7 x 6 mm (series 13, image 119, previously 12 x 10 mm). Minimal associated edema has resolved. There are 2 small foci of extra-axial/dural nodular enhancement overlying the right parietal and right temporal lobes measuring 5 mm and 7 mm, respectively (series 13, images 100 and 70 and series 14, images 17 and 16). These are closely associated with adjacent veins but are not present on any of the prior brain MRIs. No new enhancing intra-axial lesions are identified. Vascular: Major intracranial vascular flow voids are preserved. Skull and upper cervical spine: No suspicious osseous lesion. Sinuses/Orbits: Unremarkable orbits. Minimal mucosal thickening in the ethmoid sinuses. Clear mastoid air cells. Other: None. IMPRESSION: 1. Mixed interval changes in the right temporal lobe. Partial collapse of the resection cavity with mildly improved enhancement posteriorly and slightly increased enhancement anteriorly with increased surrounding edema. Findings may reflect post treatment changes, however close follow-up is recommended to assess for residual active tumor. 2. Decreased size of left parietal metastasis with resolved edema. 3. No evidence of new intra-axial metastatic disease. 4. Two subcentimeter foci of nodular extra-axial enhancement in the right temporal and right parietal regions. These may represent prominent venous structures given their appearance, however small metastatic deposits are possible as these were not present on the prior studies. Electronically Signed   By: ALogan BoresM.D.   On: 11/12/2016 14:26    Impression/Plan: 1. 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  The patient appears to be doing well overall since completing radiotherapy.She is currently taking Keppra and remains off Dexamethasone at this time. We reviewed the MRI results and indicated to her that although the larger lesion is smaller since radiotherapy, we would recommend repeating her MRI at a 6 week interval to ensure that the enhancement is not increasing, and to make sure the other areas of questionable enhancement do not change. She is in agreement and will continue her estrogen blockade under the care of Dr. GLindi Adie  Carola Rhine, PAC

## 2016-12-01 DIAGNOSIS — C50919 Malignant neoplasm of unspecified site of unspecified female breast: Secondary | ICD-10-CM | POA: Insufficient documentation

## 2016-12-01 DIAGNOSIS — Z515 Encounter for palliative care: Secondary | ICD-10-CM | POA: Insufficient documentation

## 2016-12-01 DIAGNOSIS — C7931 Secondary malignant neoplasm of brain: Secondary | ICD-10-CM

## 2017-01-06 ENCOUNTER — Other Ambulatory Visit: Payer: Self-pay

## 2017-01-06 ENCOUNTER — Emergency Department (HOSPITAL_COMMUNITY): Payer: Medicare Other

## 2017-01-06 ENCOUNTER — Inpatient Hospital Stay (HOSPITAL_COMMUNITY)
Admission: EM | Admit: 2017-01-06 | Discharge: 2017-01-08 | DRG: 055 | Disposition: A | Discharge status: Home / Self Care | Payer: Medicare Other | Attending: Internal Medicine | Admitting: Internal Medicine

## 2017-01-06 ENCOUNTER — Encounter (HOSPITAL_COMMUNITY): Payer: Self-pay

## 2017-01-06 ENCOUNTER — Observation Stay (HOSPITAL_COMMUNITY): Payer: Medicare Other

## 2017-01-06 DIAGNOSIS — D496 Neoplasm of unspecified behavior of brain: Secondary | ICD-10-CM | POA: Diagnosis not present

## 2017-01-06 DIAGNOSIS — C50912 Malignant neoplasm of unspecified site of left female breast: Secondary | ICD-10-CM

## 2017-01-06 DIAGNOSIS — Z79899 Other long term (current) drug therapy: Secondary | ICD-10-CM

## 2017-01-06 DIAGNOSIS — I6789 Other cerebrovascular disease: Secondary | ICD-10-CM | POA: Diagnosis not present

## 2017-01-06 DIAGNOSIS — C7931 Secondary malignant neoplasm of brain: Secondary | ICD-10-CM | POA: Diagnosis not present

## 2017-01-06 DIAGNOSIS — C50919 Malignant neoplasm of unspecified site of unspecified female breast: Secondary | ICD-10-CM | POA: Diagnosis present

## 2017-01-06 DIAGNOSIS — K219 Gastro-esophageal reflux disease without esophagitis: Secondary | ICD-10-CM | POA: Diagnosis present

## 2017-01-06 DIAGNOSIS — E876 Hypokalemia: Secondary | ICD-10-CM | POA: Diagnosis present

## 2017-01-06 DIAGNOSIS — G40909 Epilepsy, unspecified, not intractable, without status epilepticus: Secondary | ICD-10-CM | POA: Diagnosis not present

## 2017-01-06 DIAGNOSIS — R29810 Facial weakness: Secondary | ICD-10-CM

## 2017-01-06 DIAGNOSIS — R739 Hyperglycemia, unspecified: Secondary | ICD-10-CM

## 2017-01-06 DIAGNOSIS — R531 Weakness: Secondary | ICD-10-CM | POA: Diagnosis not present

## 2017-01-06 DIAGNOSIS — R4182 Altered mental status, unspecified: Secondary | ICD-10-CM | POA: Diagnosis present

## 2017-01-06 DIAGNOSIS — R569 Unspecified convulsions: Secondary | ICD-10-CM

## 2017-01-06 DIAGNOSIS — I10 Essential (primary) hypertension: Secondary | ICD-10-CM | POA: Diagnosis present

## 2017-01-06 DIAGNOSIS — R4701 Aphasia: Secondary | ICD-10-CM | POA: Diagnosis present

## 2017-01-06 DIAGNOSIS — Z923 Personal history of irradiation: Secondary | ICD-10-CM | POA: Diagnosis not present

## 2017-01-06 DIAGNOSIS — R404 Transient alteration of awareness: Secondary | ICD-10-CM | POA: Diagnosis not present

## 2017-01-06 DIAGNOSIS — R29818 Other symptoms and signs involving the nervous system: Secondary | ICD-10-CM | POA: Diagnosis not present

## 2017-01-06 DIAGNOSIS — R4781 Slurred speech: Secondary | ICD-10-CM | POA: Diagnosis not present

## 2017-01-06 LAB — ETHANOL: Alcohol, Ethyl (B): <5 mg/dL (ref <5)

## 2017-01-06 LAB — URINALYSIS, ROUTINE W REFLEX MICROSCOPIC
Bilirubin Urine: NEGATIVE
Glucose, UA: NEGATIVE mg/dL
Hgb urine dipstick: NEGATIVE
KETONES UR: NEGATIVE mg/dL
Nitrite: NEGATIVE
PH: 5 (ref 5.0–8.0)
Protein, ur: NEGATIVE mg/dL
Specific Gravity, Urine: 1.025 (ref 1.005–1.030)

## 2017-01-06 LAB — CBG MONITORING, ED: Glucose-Capillary: 134 mg/dL — ABNORMAL HIGH (ref 65–99)

## 2017-01-06 LAB — COMPREHENSIVE METABOLIC PANEL
ALK PHOS: 43 U/L (ref 38–126)
ALT: 18 U/L (ref 14–54)
ANION GAP: 12 (ref 5–15)
AST: 25 U/L (ref 15–41)
Albumin: 3.5 g/dL (ref 3.5–5.0)
BILIRUBIN TOTAL: 0.5 mg/dL (ref 0.3–1.2)
BUN: 18 mg/dL (ref 6–20)
CALCIUM: 9.9 mg/dL (ref 8.9–10.3)
CO2: 22 mmol/L (ref 22–32)
CREATININE: 0.93 mg/dL (ref 0.44–1.00)
Chloride: 105 mmol/L (ref 101–111)
GFR calc non Af Amer: >60 mL/min (ref >60)
Glucose, Bld: 143 mg/dL — ABNORMAL HIGH (ref 65–99)
Potassium: 2.8 mmol/L — ABNORMAL LOW (ref 3.5–5.1)
SODIUM: 139 mmol/L (ref 135–145)
TOTAL PROTEIN: 7.1 g/dL (ref 6.5–8.1)

## 2017-01-06 LAB — DIFFERENTIAL
Basophils Absolute: 0 10*3/uL (ref 0.0–0.1)
Basophils Relative: 0 %
EOS PCT: 2 %
Eosinophils Absolute: 0.2 10*3/uL (ref 0.0–0.7)
LYMPHS PCT: 31 %
Lymphs Abs: 2.8 10*3/uL (ref 0.7–4.0)
MONO ABS: 0.7 10*3/uL (ref 0.1–1.0)
Monocytes Relative: 8 %
NEUTROS ABS: 5.4 10*3/uL (ref 1.7–7.7)
Neutrophils Relative %: 59 %

## 2017-01-06 LAB — CBC
HEMATOCRIT: 40.1 % (ref 36.0–46.0)
Hemoglobin: 13.4 g/dL (ref 12.0–15.0)
MCH: 32.8 pg (ref 26.0–34.0)
MCHC: 33.4 g/dL (ref 30.0–36.0)
MCV: 98.3 fL (ref 78.0–100.0)
Platelets: 279 10*3/uL (ref 150–400)
RBC: 4.08 MIL/uL (ref 3.87–5.11)
RDW: 13.3 % (ref 11.5–15.5)
WBC: 9.2 10*3/uL (ref 4.0–10.5)

## 2017-01-06 LAB — I-STAT CHEM 8, ED
BUN: 22 mg/dL — AB (ref 6–20)
CALCIUM ION: 1.22 mmol/L (ref 1.15–1.40)
CHLORIDE: 102 mmol/L (ref 101–111)
Creatinine, Ser: 0.9 mg/dL (ref 0.44–1.00)
GLUCOSE: 140 mg/dL — AB (ref 65–99)
HCT: 41 % (ref 36.0–46.0)
Hemoglobin: 13.9 g/dL (ref 12.0–15.0)
Potassium: 2.8 mmol/L — ABNORMAL LOW (ref 3.5–5.1)
SODIUM: 142 mmol/L (ref 135–145)
TCO2: 24 mmol/L (ref 0–100)

## 2017-01-06 LAB — MAGNESIUM: Magnesium: 1.1 mg/dL — ABNORMAL LOW (ref 1.7–2.4)

## 2017-01-06 LAB — RAPID URINE DRUG SCREEN, HOSP PERFORMED
Amphetamines: NOT DETECTED
Barbiturates: NOT DETECTED
Benzodiazepines: NOT DETECTED
Cocaine: NOT DETECTED
OPIATES: NOT DETECTED
TETRAHYDROCANNABINOL: NOT DETECTED

## 2017-01-06 LAB — PROTIME-INR
INR: 0.99
PROTHROMBIN TIME: 13.1 s (ref 11.4–15.2)

## 2017-01-06 LAB — APTT: aPTT: 29 seconds (ref 24–36)

## 2017-01-06 LAB — URINALYSIS, MICROSCOPIC (REFLEX)

## 2017-01-06 LAB — I-STAT TROPONIN, ED: TROPONIN I, POC: 0.01 ng/mL (ref 0.00–0.08)

## 2017-01-06 MED ORDER — LORAZEPAM 2 MG/ML IJ SOLN
INTRAMUSCULAR | Status: AC
  Filled 2017-01-06: qty 1

## 2017-01-06 MED ORDER — POTASSIUM CHLORIDE 10 MEQ/100ML IV SOLN
10.0000 meq | Freq: Once | INTRAVENOUS | Status: AC
  Administered 2017-01-07: 10 meq via INTRAVENOUS
  Filled 2017-01-06: qty 100

## 2017-01-06 MED ORDER — ANASTROZOLE 1 MG PO TABS
1.0000 mg | ORAL_TABLET | Freq: Every day | ORAL | Status: DC
Start: 2017-01-07 — End: 2017-01-08
  Administered 2017-01-07 – 2017-01-08 (×2): 1 mg via ORAL
  Filled 2017-01-06 (×2): qty 1

## 2017-01-06 MED ORDER — PANTOPRAZOLE SODIUM 40 MG PO TBEC
40.0000 mg | DELAYED_RELEASE_TABLET | Freq: Every day | ORAL | Status: DC
  Administered 2017-01-07 – 2017-01-08 (×2): 40 mg via ORAL
  Filled 2017-01-06 (×2): qty 1

## 2017-01-06 MED ORDER — SODIUM CHLORIDE 0.9 % IV SOLN
75.0000 mL/h | INTRAVENOUS | Status: DC
  Administered 2017-01-06: 75 mL/h via INTRAVENOUS

## 2017-01-06 MED ORDER — LORAZEPAM 2 MG/ML IJ SOLN: 1.0000 mg | INTRAMUSCULAR | Status: DC | PRN

## 2017-01-06 MED ORDER — METOPROLOL SUCCINATE ER 25 MG PO TB24
25.0000 mg | ORAL_TABLET | Freq: Every day | ORAL | Status: DC
  Administered 2017-01-07 – 2017-01-08 (×2): 25 mg via ORAL
  Filled 2017-01-06 (×2): qty 1

## 2017-01-06 MED ORDER — POTASSIUM CHLORIDE CRYS ER 20 MEQ PO TBCR: 20.0000 meq | EXTENDED_RELEASE_TABLET | Freq: Every day | ORAL | Status: DC

## 2017-01-06 MED ORDER — LORAZEPAM 2 MG/ML IJ SOLN
1.0000 mg | Freq: Once | INTRAMUSCULAR | Status: AC
  Administered 2017-01-06: 1 mg via INTRAVENOUS

## 2017-01-06 MED ORDER — LORAZEPAM 2 MG/ML IJ SOLN
2.0000 mg | Freq: Once | INTRAMUSCULAR | Status: AC
  Administered 2017-01-06: 2 mg via INTRAVENOUS

## 2017-01-06 MED ORDER — LORAZEPAM 0.5 MG PO TABS: 0.5000 mg | ORAL_TABLET | Freq: Four times a day (QID) | ORAL | Status: DC | PRN

## 2017-01-06 MED ORDER — POTASSIUM CHLORIDE 10 MEQ/100ML IV SOLN
10.0000 meq | INTRAVENOUS | Status: AC
  Administered 2017-01-06 (×3): 10 meq via INTRAVENOUS
  Filled 2017-01-06 (×3): qty 100

## 2017-01-06 MED ORDER — ONDANSETRON HCL 4 MG/2ML IJ SOLN: 4.0000 mg | Freq: Four times a day (QID) | INTRAMUSCULAR | Status: DC | PRN

## 2017-01-06 MED ORDER — SODIUM CHLORIDE 0.9 % IV SOLN
1000.0000 mg | Freq: Two times a day (BID) | INTRAVENOUS | Status: DC
  Administered 2017-01-07 – 2017-01-08 (×4): 1000 mg via INTRAVENOUS
  Filled 2017-01-06 (×4): qty 10

## 2017-01-06 MED ORDER — TERAZOSIN HCL 5 MG PO CAPS
5.0000 mg | ORAL_CAPSULE | Freq: Every day | ORAL | Status: DC
  Filled 2017-01-06: qty 1

## 2017-01-06 MED ORDER — ENOXAPARIN SODIUM 40 MG/0.4ML ~~LOC~~ SOLN
40.0000 mg | Freq: Every day | SUBCUTANEOUS | Status: DC
  Administered 2017-01-07: 40 mg via SUBCUTANEOUS
  Filled 2017-01-06: qty 0.4

## 2017-01-06 MED ORDER — ONDANSETRON HCL 4 MG PO TABS: 4.0000 mg | ORAL_TABLET | Freq: Four times a day (QID) | ORAL | Status: DC | PRN

## 2017-01-06 MED ORDER — VITAMIN B-12 1000 MCG PO TABS
1000.0000 ug | ORAL_TABLET | Freq: Every day | ORAL | Status: DC
  Administered 2017-01-07 – 2017-01-08 (×2): 1000 ug via ORAL
  Filled 2017-01-06 (×2): qty 1

## 2017-01-06 MED ORDER — GADOBENATE DIMEGLUMINE 529 MG/ML IV SOLN
20.0000 mL | Freq: Once | INTRAVENOUS | Status: AC | PRN
  Administered 2017-01-06: 20 mL via INTRAVENOUS

## 2017-01-06 MED ORDER — TERAZOSIN HCL 5 MG PO CAPS
5.0000 mg | ORAL_CAPSULE | Freq: Every day | ORAL | Status: DC
  Administered 2017-01-07: 5 mg via ORAL
  Filled 2017-01-06: qty 1

## 2017-01-06 MED ORDER — LEVETIRACETAM 500 MG PO TABS: 1000.0000 mg | ORAL_TABLET | Freq: Two times a day (BID) | ORAL | Status: DC

## 2017-01-06 MED ORDER — ACETAMINOPHEN 325 MG PO TABS: 650.0000 mg | ORAL_TABLET | ORAL | Status: DC | PRN

## 2017-01-06 MED ORDER — SODIUM CHLORIDE 0.9 % IV SOLN
1000.0000 mg | Freq: Once | INTRAVENOUS | Status: AC
  Administered 2017-01-06: 1000 mg via INTRAVENOUS
  Filled 2017-01-06: qty 10

## 2017-01-06 MED ORDER — ACETAMINOPHEN 650 MG RE SUPP: 650.0000 mg | RECTAL | Status: DC | PRN

## 2017-01-06 NOTE — ED Notes (Addendum)
PER EMS: pt arrives as a "Code Stroke" due to left sided facial numbness, droop and expressive aphasia. LSN today at 11am. Hx of brain tumors. Symptoms still present upon arrival. CBG 164, BP-188/108, HR-164  Pt is able to write down what she wants to say on paper. Pt appears A&OX4.

## 2017-01-06 NOTE — H&P (Signed)
History and Physical    Savannah Howard XKG:[id number] DOB: 06/29/1946 DOA: 01/06/2017  PCP: [name], MD Consultants:  Lindi Adie - oncology; Ditty - neurosurgery; Tammi Klippel - rad onc; Bensimhon - cardiology Patient coming from: home - lives alone; NOK: friend, [phone]  Chief Complaint: AMS  HPI: Savannah Howard is a 71 y.o. 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  Thought she felt ok when she first got up.  Fixed herself some eggs.  Went to lay back down and then wasn't feeling well.  She felt numb on the left side of her face.  She tried to get her convulsion pill and couldn't swallow pills or water.  She knew she felt simliar to when she collapsed in December so she called her friend/caregiver Savannah Howard.  When she arrived, the patient's mouth was twitching a lot and she was drooling a white foam.  Speech was very slurred - she could write and walk but unable to talk.  They arrived at her house about 1, she got to the ER about 3 and she improved after receiving Ativan while here.  Spell started around 11.  Did not notice weakness or numbness of either arm or leg.  She was walking around very well.  She does have severe neuropathy and shakes badly at times.  She feels better than she did now.  Currently still with delay in speech but very much improved.  Usually pretty witty but not processing as well now.     ED Course: Code stroke.  Given Ativan and Keppra IV with improvement.  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  Review of Systems: As per HPI; otherwise 10 point review of systems reviewed and negative.   Ambulatory Status:  Occasionally uses a walker for balance  Past Medical History:  Diagnosis Date  . Allergy    rhinitis  . Anemia   . Anxiety   . Brain cancer ([place]) 06/2016   breast ca with mets to brain  . DDD (degenerative disc  disease), lumbar dx'd in 1980's  . GERD (gastroesophageal reflux disease)   . Heart murmur   . History of blood transfusion 2016 X 3 (05/30/2015)  . Hypertension   . Kidney stone 1990's X 1   "passed it"  . Obesity   . 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 Vitamin B 12 deficiency   . Vitamin D deficiency     Past Surgical History:  Procedure Laterality Date  . APPLICATION OF CRANIAL NAVIGATION N/A 07/13/2016   Procedure: APPLICATION OF CRANIAL NAVIGATION;  Surgeon: Kevan Ny Ditty, MD;  Location: [facility];  Service: Neurosurgery;  Laterality: N/A;  . BREAST BIOPSY Left ~ 12/2014  . CHOLECYSTECTOMY OPEN  ~ 1975  . ESOPHAGEAL DILATION  X 3  . MASTECTOMY COMPLETE / SIMPLE W/ SENTINEL NODE BIOPSY Left 05/30/2015  . MASTECTOMY W/ SENTINEL NODE BIOPSY Left 05/30/2015   Procedure: LEFT MASTECTOMY WITH SENTINEL LYMPH NODE BIOPSY;  Surgeon: Autumn Messing III, MD;  Location: [facility];  Service: General;  Laterality: Left;  . PORTACATH PLACEMENT Right 12/26/2014   Procedure: INSERTION PORT-A-CATH;  Surgeon: Autumn Messing III, MD;  Location: [facility];  Service: General;  Laterality: Right;  . PR DURAL GRAFT REPAIR,SPINE DEFECT N/A 07/13/2016   Procedure: Marshia Ly STERIOTACTIC BIOPSY WITH STEALTH;  Surgeon: Kevan Ny Ditty, MD;  Location:  [facility];  Service: Neurosurgery;  Laterality: N/A;    Social History   Social History  . Marital status: Single    Spouse name: N/A  . Number of children: N/A  . Years of education: N/A   Occupational History  . retired Energy manager   Social History Main Topics  . Smoking status: Never Smoker  . Smokeless tobacco: Never Used  . Alcohol use Yes     Comment: 05/30/2015 "I might take a drink of wine 1-2 times/yr"  . Drug use: No  . Sexual activity: Not Currently   Other Topics Concern  . Not on file   Social History Narrative   Regular exercise- no    Allergies  Allergen Reactions  . Penicillins  Swelling and Other (See Comments)    Genital swelling/skin peeled/yeast infection Has patient had a PCN reaction causing immediate rash, facial/tongue/throat swelling, SOB or lightheadedness with hypotension: Yes Has patient had a PCN reaction causing severe rash involving mucus membranes or skin necrosis: Unknown Has patient had a PCN reaction that required hospitalization: Unknown Has patient had a PCN reaction occurring within the last 10 years: Unknown If all of the above answers are "NO", then may proceed with   . Sulfonamide Derivatives Other (See Comments)    Convulsions      Family History  Problem Relation Age of Onset  . Heart disease Mother        CAD  . COPD Mother   . Heart disease Father 81       leaky valve  . Parkinsonism Father   . Hypertension Other     Prior to Admission medications   Medication Sig Start Date End Date Taking? Authorizing Provider  acetaminophen (TYLENOL) 500 MG tablet Take 1,000 mg by mouth at bedtime as needed for mild pain or headache.    Yes [provider]  anastrozole (ARIMIDEX) 1 MG tablet TAKE 1 TABLET (1 MG TOTAL) BY MOUTH DAILY. 08/20/16  Yes Nicholas Lose, MD  cyanocobalamin (,VITAMIN B-12,) 1000 MCG/ML injection Inject 1,000 mcg into the muscle every 30 (thirty) days.   Yes [provider]  LORazepam (ATIVAN) 0.5 MG tablet TAKE 1 TABLET BY MOUTH 3 TIMES A DAY AS NEEDED FOR ANXIETY 08/28/16  Yes [name], [name], MD  levETIRAcetam (KEPPRA) 750 MG tablet Take 1 tablet (750 mg total) by mouth 2 (two) times daily. 07/16/16   Thurnell Lose, MD  losartan-hydrochlorothiazide (HYZAAR) 100-25 MG tablet TAKE 1 TABLET BY MOUTH EVERY DAY 08/21/16   [name], [name], MD  metoprolol succinate (TOPROL-XL) 25 MG 24 hr tablet TAKE 1 TABLET (25 MG TOTAL) BY MOUTH DAILY. 08/20/16   Bensimhon, Shaune Pascal, MD  metoprolol succinate (TOPROL-XL) 50 MG 24 hr tablet Take 25 mg by mouth daily. 12/30/16   [provider]  omeprazole  (PRILOSEC) 20 MG capsule Take 20 mg by mouth daily.    [provider]  potassium chloride (MICRO-K) 10 MEQ CR capsule TAKE ONE CAPSULE BY MOUTH EVERY DAY 08/21/16   [name], [name], MD  terazosin (HYTRIN) 5 MG capsule Take 5 mg by mouth at bedtime. Do not crush    [provider]  vitamin B-12 (CYANOCOBALAMIN) 1000 MCG tablet Take 1,000 mcg by mouth daily.    [provider]    Physical Exam: Vitals:   01/06/17 2030 01/06/17 2045 01/06/17 2300 01/07/17 0000  BP: (!) 142/74 (!) 127/102 (!) 161/81 (!) 181/61  Pulse: 74 71 65 63  Resp: 16 16  18 18  Temp:   98.6 F (37 C)   TempSrc:   Oral   SpO2: 96% 96% 97% 98%     General:  Appears calm and comfortable and is NAD, mild apparent cognitive slowing Eyes:  PERRL, EOMI, normal lids, iris ENT:  grossly normal hearing, lips & tongue, mmm Neck:  no LAD, masses or thyromegaly Cardiovascular:  RRR, no m/r/g. No LE edema.  Respiratory:  CTA bilaterally, no w/r/r. Normal respiratory effort. Abdomen:  soft, ntnd, NABS Skin:  no rash or induration seen on limited exam Musculoskeletal:  grossly normal tone BUE/BLE, good ROM, no bony abnormality Psychiatric:  Mild cognitive slowing with occasional witty comments, speech fluent and appropriate but a bit slowed, AOx3 Neurologic:  CN 2-12 grossly intact other than left facial droop (mild), moves all extremities in coordinated fashion, sensation intact  Labs on Admission: I have personally reviewed following labs and imaging studies  CBC:  Recent Labs Lab 01/06/17 1553 01/06/17 1559  WBC 9.2  --   NEUTROABS 5.4  --   HGB 13.4 13.9  HCT 40.1 41.0  MCV 98.3  --   PLT 279  --    Basic Metabolic Panel:  Recent Labs Lab 01/06/17 1553 01/06/17 1559  NA 139 142  K 2.8* 2.8*  CL 105 102  CO2 22  --   GLUCOSE 143* 140*  BUN 18 22*  CREATININE 0.93 0.90  CALCIUM 9.9  --   MG 1.1*  --    GFR: CrCl cannot be calculated (Unknown ideal weight.). Liver  Function Tests:  Recent Labs Lab 01/06/17 1553  AST 25  ALT 18  ALKPHOS 43  BILITOT 0.5  PROT 7.1  ALBUMIN 3.5   No results for input(s): LIPASE, AMYLASE in the last 168 hours. No results for input(s): AMMONIA in the last 168 hours. Coagulation Profile:  Recent Labs Lab 01/06/17 1553  INR 0.99   Cardiac Enzymes: No results for input(s): CKTOTAL, CKMB, CKMBINDEX, TROPONINI in the last 168 hours. BNP (last 3 results) No results for input(s): PROBNP in the last 8760 hours. HbA1C: No results for input(s): HGBA1C in the last 72 hours. CBG:  Recent Labs Lab 01/06/17 1553  GLUCAP 134*   Lipid Profile: No results for input(s): CHOL, HDL, LDLCALC, TRIG, CHOLHDL, LDLDIRECT in the last 72 hours. Thyroid Function Tests: No results for input(s): TSH, T4TOTAL, FREET4, T3FREE, THYROIDAB in the last 72 hours. Anemia Panel: No results for input(s): VITAMINB12, FOLATE, FERRITIN, TIBC, IRON, RETICCTPCT in the last 72 hours. Urine analysis:    Component Value Date/Time   COLORURINE YELLOW 01/06/2017 1749   APPEARANCEUR HAZY (A) 01/06/2017 1749   LABSPEC 1.025 01/06/2017 1749   PHURINE 5.0 01/06/2017 1749   GLUCOSEU NEGATIVE 01/06/2017 1749   GLUCOSEU NEGATIVE 11/05/2014 1456   HGBUR NEGATIVE 01/06/2017 1749   BILIRUBINUR NEGATIVE 01/06/2017 1749   KETONESUR NEGATIVE 01/06/2017 1749   PROTEINUR NEGATIVE 01/06/2017 1749   UROBILINOGEN 0.2 11/05/2014 1456   NITRITE NEGATIVE 01/06/2017 1749   LEUKOCYTESUR SMALL (A) 01/06/2017 1749    Creatinine Clearance: CrCl cannot be calculated (Unknown ideal weight.).  Sepsis Labs: @LABRCNTIP (procalcitonin:4,lacticidven:4) )No results found for this or any previous visit (from the past 240 hour(s)).   Radiological Exams on Admission: Mr Jeri Cos And Wo Contrast  Result Date: 01/06/2017 CLINICAL DATA:  Seizures, expressive aphasia, LEFT arm weakness today; unable to take Keppra due to vomiting. History of breast cancer, on radiation with  known brain metastasis. EXAM: MRI HEAD WITHOUT AND WITH  CONTRAST TECHNIQUE: Multiplanar, multiecho pulse sequences of the brain and surrounding structures were obtained without and with intravenous contrast. CONTRAST:  24mL MULTIHANCE GADOBENATE DIMEGLUMINE 529 MG/ML IV SOLN COMPARISON:  CT HEAD Jan 06, 2017 at 1432 hours and MRI of the head November 12, 2016 FINDINGS: INTRACRANIAL CONTENTS: Faint reduced diffusion associated with enlarging RIGHT temporal lobe metastasis. No reduced diffusion to suggest acute ischemia. Susceptibility artifact associated with RIGHT temporal lobe resection cavity, stable. RIGHT anterior temporal pole 13 x 14 mm metastasis was 4 x 6 mm. RIGHT posterior temporal lobe 9 x 11 mm metastasis was 4 x 5 mm. RIGHT posterior temporal dural based 9 x 15 mm metastasis was 3 x 6 mm. RIGHT parietal dural based 5 x 11 mm metastasis was 3 x 6 mm. LEFT parietal lobe stable 7 x 8 mm metastasis. 18 x 29 mm thickened irregular enhancement along the margin of RIGHT temporal lobe resection cavity was 17 x 28 mm, with thicker inferior component consistent with disease progression. No midline shift. Increasing vasogenic edema RIGHT temporal lobe in a background of mild chronic small vessel ischemic disease. Normal symmetric size, morphology and signal of the hippocampi. Ventricles and sulci are overall normal for patient's age. RIGHT frontal lobe developmental venous anomaly. No abnormal extra-axial fluid collections. VASCULAR: Normal major intracranial vascular flow voids present at skull base. SKULL AND UPPER CERVICAL SPINE: No abnormal sellar expansion. RIGHT frontotemporal craniotomy. No suspicious calvarial bone marrow signal. Craniocervical junction maintained. SINUSES/ORBITS: The mastoid air-cells and included paranasal sinuses are well-aerated.The included ocular globes and orbital contents are non-suspicious. OTHER: None. IMPRESSION: No acute intracranial process. Disease progression November 12, 2016:  Stable number though increased size of multiple parenchymal and dural metastasis, majority in RIGHT temporal lobe. Worsening vasogenic edema RIGHT temporal lobe, which could contribute to seizures. No midline shift. Electronically Signed   By: Elon Alas M.D.   On: 01/06/2017 23:01   Ct Head Code Stroke W/o Cm  Result Date: 01/06/2017 CLINICAL DATA:  Code stroke.  Aphasia. EXAM: CT HEAD WITHOUT CONTRAST TECHNIQUE: Contiguous axial images were obtained from the base of the skull through the vertex without intravenous contrast. COMPARISON:  07/10/2016 FINDINGS: Brain: Moderate low-density in the right temporal lobe in this patient with treated metastasis in this location. This moderate cytotoxic type edema has a stable extent given cross modality differences. There is also a left parietal metastasis by brain MRI, not clearly seen today. Detection of acute infarct is limited by streak artifact and motion. In places the posterior left insula and medial temporal cortex is difficult to visualize, but this is favored secondary to streak artifact based on reformats. Small area of nonvisualized cortex along the lateral posterior left frontal lobe is also in an area of streak artifact. No hemorrhage or hydrocephalus. No atrophy. Vascular: No atherosclerotic calcification or hyperdense vessel. Skull: Right pterional craniotomy.  Hyperostosis interna. Sinuses/Orbits: No acute finding Other:  A page has been placed to Dr. Cheral Marker 01/06/2017  at 4:06 pm ASPECTS [facility] Stroke Program Early CT Score) Not scored given the vasogenic edema on the right. IMPRESSION: 1. Known treated right temporal lobe metastasis with moderate vasogenic edema that is similar to 11/12/2016 brain MRI. A known left parietal metastasis is not visible on this study. 2. Motion and streak artifact limits detection of acute infarct. No convincing acute ischemia or hyperdense vessel. No acute hemorrhage. Electronically Signed   By: Monte Fantasia  M.D.   On: 01/06/2017 16:10    EKG: Independently reviewed.  NSR with rate 72;LVH, low voltage, nonspecific ST changes with no evidence of acute ischemia  Assessment/Plan Principal Problem:   Altered mental status Active Problems:   Hypertension   Breast cancer metastasized to brain ([facility])   Hypokalemia   Hypomagnesemia   Hyperglycemia   Seizure disorder ([place])   AMS -Patient presenting with initial concern for CVA, but appears to instead be seizure-related -Patient missed at least one dose of Keppra prior to onset of symptoms -UA: many bacteria, small LE, otherwise unremarkable -UDS negative, ETOH negative -Brain MRI unfortunately appears to show progression of lesions with edema which likely is the source of seizure activity. -Patient placed in observation overnight for further evaluation -Seizure precautions -Ativan prn -Keppra increased to 1000 mg BID (will give IV for now due to concern about aspiration risk until able to assess with swallow evaluation by speech tomorrow) -Neurology consult appreciated -Plan for EEG in AM if she has an additional seizure overnight  Electrolyte abnormalities -K+ 2.8 -Magnesium 1.1 -Will replete and recheck in AM  Hyperglycemia -Glucose 143, 140 -May be stress response -Will follow with fasting AM labs -It is unlikely that she will need acute or chronic treatment for this issue  HTN -Hold Hyzaar -Continue Toprol XL   DVT prophylaxis:  Lovenox  Code Status:  Full - confirmed with patient/family Family Communication: Friends present throughout evaluation Disposition Plan: Home once clinically improved - may benefit from AL placement (currently lives alone in a motel room), will request SW consult Consults called: Neurology, SW, speech Admission status: It is my clinical opinion that referral for OBSERVATION is reasonable and necessary in this patient based on the above information provided. The aforementioned taken together are felt  to place the patient at high risk for further clinical deterioration. However it is anticipated that the patient may be medically stable for discharge from the hospital within 24 to 48 hours.    Karmen Bongo MD [facility]  If 7PM-7AM, please contact night-coverage [email or web address] Password [email or web address]  01/07/2017, 1:23 AM

## 2017-01-06 NOTE — Consult Note (Signed)
NEURO HOSPITALIST CONSULT NOTE   Requestig physician: Dr. Lacinda Axon  Reason for Consult: Code Stroke initially, followed by seizure en route with continued seizure activity on arrival  History obtained from:  Patient     HPI:                                                                                                                                          Savannah Howard is an 71 y.o. female with known breast cancer currently undergoing radiation. Patient also has known seizures for which she takes Keppra 750 mg twice a day. Patient's last seizure was back in December 1. She has been doing well with her Keppra since then. Patient states that this morning she was having some stomach issues and throwing up. Due to this, she was unable to take her morning dose of Keppra. EMS was called to the scene secondary to patient being unable to express herself but she was able to write on a piece of paper. En route patient started to have oral seizures and expressive difficulties. On arrival patient had clearly notable oral and left facial seizures but was able to follow commands and intermittently express what was going on. Patient was given 2 mg of Ativan while in CT which seem to slow the seizure activity down but did not stop the seizure activity. She was given an additional 1 mg of Ativan and 1 g of Keppra was infused.   Patient's most recent MRI was done on 11/12/2016 revealing the following:  1. Mixed interval changes in the right temporal lobe. Partial collapse of the resection cavity with mildly improved enhancement posteriorly and slightly increased enhancement anteriorly with increased surrounding edema. Findings may reflect post treatment changes, however close follow-up is recommended to assess for residual active tumor. 2. Decreased size of left parietal metastasis with resolved edema. 3. No evidence of new intra-axial metastatic disease. 4. Two subcentimeter foci of nodular  extra-axial enhancement in the right temporal and right parietal regions. These may represent prominent venous structures given their appearance, however small metastatic deposits are possible as these were not present on the prior studies.  Past Medical History:  Diagnosis Date  . Allergy    rhinitis  . Anemia   . Anxiety   . Brain cancer ([place])    breast ca with mets to brain  . Breast cancer ([place])   . DDD (degenerative disc disease), lumbar dx'd in 1980's  . GERD (gastroesophageal reflux disease)   . Heart murmur   . History of blood transfusion 2016 X 3 (05/30/2015)  . Hypertension   . Kidney stone 1990's X 1   "passed it"  . Obesity   . 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  Vitamin B 12 deficiency   . Vitamin D deficiency     Past Surgical History:  Procedure Laterality Date  . APPLICATION OF CRANIAL NAVIGATION N/A 07/13/2016   Procedure: APPLICATION OF CRANIAL NAVIGATION;  Surgeon: Kevan Ny Ditty, MD;  Location: [place];  Service: Neurosurgery;  Laterality: N/A;  . BREAST BIOPSY Left ~ 12/2014  . CHOLECYSTECTOMY OPEN  ~ 1975  . ESOPHAGEAL DILATION  X 3  . MASTECTOMY COMPLETE / SIMPLE W/ SENTINEL NODE BIOPSY Left 05/30/2015  . MASTECTOMY W/ SENTINEL NODE BIOPSY Left 05/30/2015   Procedure: LEFT MASTECTOMY WITH SENTINEL LYMPH NODE BIOPSY;  Surgeon: Autumn Messing III, MD;  Location: [place];  Service: General;  Laterality: Left;  . PORTACATH PLACEMENT Right 12/26/2014   Procedure: INSERTION PORT-A-CATH;  Surgeon: Autumn Messing III, MD;  Location: [place];  Service: General;  Laterality: Right;  . PR DURAL GRAFT REPAIR,SPINE DEFECT N/A 07/13/2016   Procedure: Marshia Ly STERIOTACTIC BIOPSY WITH STEALTH;  Surgeon: Kevan Ny Ditty, MD;  Location: [facility];  Service: Neurosurgery;  Laterality: N/A;    Family History  Problem Relation Age of Onset  . Heart disease Mother        CAD  . COPD Mother   . Heart disease Father 59        leaky valve  . Parkinsonism Father   . Hypertension Other     Social History:  reports that she has never smoked. She has never used smokeless tobacco. She reports that she drinks alcohol. She reports that she does not use drugs.  Allergies  Allergen Reactions  . Penicillins Other (See Comments)    REACTION: genital swelling and skin peeled ? yeast infection ?  . Sulfonamide Derivatives Other (See Comments)    convulsions     MEDICATIONS:                                                                                                                     Current Facility-Administered Medications  Medication Dose Route Frequency Provider Last Rate Last Dose  . levETIRAcetam (KEPPRA) 1,000 mg in sodium chloride 0.9 % 100 mL IVPB  1,000 mg Intravenous Once Kerney Elbe, MD      . LORazepam (ATIVAN) 2 MG/ML injection           . LORazepam (ATIVAN) 2 MG/ML injection            Current Outpatient Prescriptions  Medication Sig Dispense Refill  . acetaminophen (TYLENOL) 500 MG tablet Take 1,000 mg by mouth at bedtime.     Marland Kitchen anastrozole (ARIMIDEX) 1 MG tablet TAKE 1 TABLET (1 MG TOTAL) BY MOUTH DAILY. 90 tablet 2  . cyanocobalamin (,VITAMIN B-12,) 1000 MCG/ML injection Inject 1,000 mcg into the muscle every 30 (thirty) days.    Marland Kitchen levETIRAcetam (KEPPRA) 750 MG tablet Take 1 tablet (750 mg total) by mouth 2 (two) times daily. (Patient not taking: Reported on 11/16/2016)    . LORazepam (ATIVAN) 0.5 MG tablet TAKE 1 TABLET BY MOUTH  3 TIMES A DAY AS NEEDED FOR ANXIETY 270 tablet 0  . losartan-hydrochlorothiazide (HYZAAR) 100-25 MG tablet TAKE 1 TABLET BY MOUTH EVERY DAY 90 tablet 0  . metoprolol succinate (TOPROL-XL) 25 MG 24 hr tablet TAKE 1 TABLET (25 MG TOTAL) BY MOUTH DAILY. 30 tablet 1  . omeprazole (PRILOSEC) 20 MG capsule Take 20 mg by mouth daily.    . potassium chloride (MICRO-K) 10 MEQ CR capsule TAKE ONE CAPSULE BY MOUTH EVERY DAY 90 capsule 0  . terazosin (HYTRIN) 5 MG capsule Take 5  mg by mouth at bedtime. Do not crush    . vitamin B-12 (CYANOCOBALAMIN) 1000 MCG tablet Take 1,000 mcg by mouth daily.      ROS:                                                                                                                                       History obtained from the patient  General ROS: negative for - chills, fatigue, fever, night sweats, weight gain or weight loss Psychological ROS: negative for - behavioral disorder, hallucinations, memory difficulties, mood swings or suicidal ideation Ophthalmic ROS: negative for - blurry vision, double vision, eye pain or loss of vision ENT ROS: negative for - epistaxis, nasal discharge, oral lesions, sore throat, tinnitus or vertigo Allergy and Immunology ROS: negative for - hives or itchy/watery eyes Hematological and Lymphatic ROS: negative for - bleeding problems, bruising or swollen lymph nodes Endocrine ROS: negative for - galactorrhea, hair pattern changes, polydipsia/polyuria or temperature intolerance Respiratory ROS: negative for - cough, hemoptysis, shortness of breath or wheezing Cardiovascular ROS: negative for - chest pain, dyspnea on exertion, edema or irregular heartbeat Gastrointestinal ROS: Positive for -  nausea/vomiting Genito-Urinary ROS: negative for - dysuria, hematuria, incontinence or urinary frequency/urgency Musculoskeletal ROS: negative for - joint swelling or muscular weakness Neurological ROS: as noted in HPI Dermatological ROS: negative for rash and skin lesion changes   Blood pressure 124/78, pulse 83, resp. rate 18, SpO2 95 %.   General Examination:                                                                                                      HEENT-  Normocephalic, no lesions, without obvious abnormality.  Normal external eye and conjunctiva.  Normal TM's bilaterally.  Normal auditory canals and external ears. Normal external nose, mucus membranes and septum.  Normal  pharynx. Cardiovascular- S1, S2 normal, pulses palpable throughout   Lungs- chest clear, no wheezing, rales,  normal symmetric air entry Abdomen- normal findings: bowel sounds normal Extremities- no edema Lymph-no adenopathy palpable Musculoskeletal-no joint tenderness, deformity or swelling Skin-warm and dry, no hyperpigmentation, vitiligo, or suspicious lesions  Neurological Examination Mental Status: Alert, orientepatient is having expressive aphasia secondary to partial status. She is able to follow commands to the best of her ability. Speech is dysarthric secondary to ongoing seizure activity crainal Nerves: II:  Visual fields grossly normal,  III,IV, VI: ptosis not present, extra-ocular motions intact bilaterally pupils equal, round, reactive to light and accommodation V,VII: Left facial droop, along with ongoing left facial seizure activity, light touch sensation normal bilaterally VIII: hearing normal bilaterally IX,X: uvula rises symmetrically XI: bilateral shoulder shrug XII: midline tongue extension Motor: Patient is moving all extremities 5 out of 5 strength and shows no seizure activity in the arms or legs Sensory: Pinprick and light touch intact throughout, bilaterally Deep Tendon Reflexes: 1+ and symmetric throughout Plantars: Mute bilaterally Cerebellar: No ataxia with FNF bilaterally Gait:  not tested   Lab Results: Basic Metabolic Panel: No results for input(s): NA, K, CL, CO2, GLUCOSE, BUN, CREATININE, CALCIUM, MG, PHOS in the last 168 hours.  Liver Function Tests: No results for input(s): AST, ALT, ALKPHOS, BILITOT, PROT, ALBUMIN in the last 168 hours. No results for input(s): LIPASE, AMYLASE in the last 168 hours. No results for input(s): AMMONIA in the last 168 hours.  CBC: No results for input(s): WBC, NEUTROABS, HGB, HCT, MCV, PLT in the last 168 hours.  Cardiac Enzymes: No results for input(s): CKTOTAL, CKMB, CKMBINDEX, TROPONINI in the last 168  hours.  Lipid Panel: No results for input(s): CHOL, TRIG, HDL, CHOLHDL, VLDL, LDLCALC in the last 168 hours.  CBG: No results for input(s): GLUCAP in the last 168 hours.  Microbiology: Results for orders placed or performed during the hospital encounter of 07/09/16  Urine culture     Status: Abnormal   Collection Time: 07/09/16 10:55 PM  Result Value Ref Range Status   Specimen Description URINE, CLEAN CATCH  Final   Special Requests NONE  Final   Culture MULTIPLE SPECIES PRESENT, SUGGEST RECOLLECTION (A)  Final   Report Status 07/11/2016 FINAL  Final  Surgical PCR screen     Status: None   Collection Time: 07/12/16  8:18 PM  Result Value Ref Range Status   MRSA, PCR NEGATIVE NEGATIVE Final   Staphylococcus aureus NEGATIVE NEGATIVE Final    Comment:        The Xpert SA Assay (FDA approved for NASAL specimens in patients over 36 years of age), is one component of a comprehensive surveillance program.  Test performance has been validated by [facility] for patients greater than or equal to 28 year old. It is not intended to diagnose infection nor to guide or monitor treatment.     Coagulation Studies: No results for input(s): LABPROT, INR in the last 72 hours.  Imaging: No results found.  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  1. At baseline, she  takes 750 mg Keppra twice a day but missed her morning dose this morning secondary to nausea and vomiting.  2. 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  3. She has received 3 mg of Ativan and 1000 mg of Keppra has been infused.  4. Hypokalemia 5. 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 Increase scheduled dose of Keppra to 1000 mg BID. 2.  Ativan PRN breakthrough seizure 3. EEG in AM if she has an additional seizure overnight 4. Correct potassium 5. MRI brain with contrast to assess for possible new lesion or worsened perilesional edema 6. Magnesium level  Electronically signed: Dr. Kerney Elbe

## 2017-01-06 NOTE — ED Provider Notes (Signed)
Sav[facility] Howard DEPT Provider Note   CSN: [id number] Arrival date & time: 01/06/17  1533     History   Chief Complaint Chief Complaint  Patient presents with  . Code Stroke    HPI Savannah Howard is a 71 y.o. female.  The history is provided by the patient and medical records.  Neurologic Problem  This is a new problem. The current episode started 6 to 12 hours ago. The problem occurs constantly. The problem has not changed since onset.Pertinent negatives include no chest pain, no abdominal pain, no headaches and no shortness of breath. Nothing aggravates the symptoms. Nothing relieves the symptoms. She has tried nothing for the symptoms. The treatment provided no relief.    Past Medical History:  Diagnosis Date  . Allergy    rhinitis  . Anemia   . Anxiety   . Brain cancer ([place])    breast ca with mets to brain  . Breast cancer ([place])   . DDD (degenerative disc disease), lumbar dx'd in 1980's  . GERD (gastroesophageal reflux disease)   . Heart murmur   . History of blood transfusion 2016 X 3 (05/30/2015)  . Hypertension   . Kidney stone 1990's X 1   "passed it"  . Obesity   . 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 Vitamin B 12 deficiency   . Vitamin D deficiency     Patient Active Problem List   Diagnosis Date Noted  . Palliative care by specialist   . 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 Malignant brain tumor ([place]) 07/18/2016  . Breast cancer metastasized to brain ([place]) 07/18/2016  . Knee pain, left 07/18/2016  . Pressure injury of skin 07/11/2016  . Fall 07/10/2016  . UTI (urinary tract infection) 07/10/2016  . Rhabdomyolysis 07/10/2016  . Syncope 07/10/2016  . 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 Sepsis ([place]) 07/10/2016  . Urinary tract infection with hematuria   . Brain tumor ([place])   . Antineoplastic chemotherapy induced anemia 04/02/2015  . Weakness generalized 01/29/2015  . Dehydration  01/28/2015  . Rash 01/28/2015  . Chemotherapy induced diarrhea 01/08/2015  . Mucositis due to chemotherapy 01/08/2015  . Breast cancer of upper-outer quadrant of left female breast ([place]) 12/20/2014  . Well adult exam 10/26/2012  . Dyslipidemia 06/23/2011  . Hypertension 12/22/2010  . UPPER RESPIRATORY INFECTION, ACUTE 04/30/2010  . Anxiety 10/28/2009  . VITAMIN D DEFICIENCY 04/24/2009  . HYPERGLYCEMIA 04/24/2009  . VITAMIN B12 DEFICIENCY 10/22/2008  . ALLERGIC RHINITIS 05/09/2007  . GERD 05/09/2007    Past Surgical History:  Procedure Laterality Date  . APPLICATION OF CRANIAL NAVIGATION N/A 07/13/2016   Procedure: APPLICATION OF CRANIAL NAVIGATION;  Surgeon: Kevan Ny Ditty, MD;  Location: [facility];  Service: Neurosurgery;  Laterality: N/A;  . BREAST BIOPSY Left ~ 12/2014  . CHOLECYSTECTOMY OPEN  ~ 1975  . ESOPHAGEAL DILATION  X 3  . MASTECTOMY COMPLETE / SIMPLE W/ SENTINEL NODE BIOPSY Left 05/30/2015  . MASTECTOMY W/ SENTINEL NODE BIOPSY Left 05/30/2015   Procedure: LEFT MASTECTOMY WITH SENTINEL LYMPH NODE BIOPSY;  Surgeon: Autumn Messing III, MD;  Location: [facility];  Service: General;  Laterality: Left;  . PORTACATH PLACEMENT Right 12/26/2014   Procedure: INSERTION PORT-A-CATH;  Surgeon: Autumn Messing III, MD;  Location: [facility];  Service: General;  Laterality: Right;  . PR DURAL GRAFT REPAIR,SPINE DEFECT N/A 07/13/2016   Procedure: FRAMLESS STERIOTACTIC BIOPSY WITH STEALTH;  Surgeon: Kevan Ny Ditty, MD;  Location: [facility];  Service: Neurosurgery;  Laterality: N/A;    OB History    No data available       Home Medications    Prior to Admission medications   Medication Sig Start Date End Date Taking? Authorizing Provider  acetaminophen (TYLENOL) 500 MG tablet Take 1,000 mg by mouth at bedtime.     [provider]  anastrozole (ARIMIDEX) 1 MG tablet TAKE 1 TABLET (1 MG TOTAL) BY MOUTH DAILY. 08/20/16   Nicholas Lose, MD  cyanocobalamin (,VITAMIN B-12,)  1000 MCG/ML injection Inject 1,000 mcg into the muscle every 30 (thirty) days.    [provider]  levETIRAcetam (KEPPRA) 750 MG tablet Take 1 tablet (750 mg total) by mouth 2 (two) times daily. Patient not taking: Reported on 11/16/2016 07/16/16   Thurnell Lose, MD  LORazepam (ATIVAN) 0.5 MG tablet TAKE 1 TABLET BY MOUTH 3 TIMES A DAY AS NEEDED FOR ANXIETY 08/28/16   [name], [name], MD  losartan-hydrochlorothiazide (HYZAAR) 100-25 MG tablet TAKE 1 TABLET BY MOUTH EVERY DAY 08/21/16   [name], [name], MD  metoprolol succinate (TOPROL-XL) 25 MG 24 hr tablet TAKE 1 TABLET (25 MG TOTAL) BY MOUTH DAILY. 08/20/16   Bensimhon, Shaune Pascal, MD  omeprazole (PRILOSEC) 20 MG capsule Take 20 mg by mouth daily.    [provider]  potassium chloride (MICRO-K) 10 MEQ CR capsule TAKE ONE CAPSULE BY MOUTH EVERY DAY 08/21/16   [name], [name], MD  terazosin (HYTRIN) 5 MG capsule Take 5 mg by mouth at bedtime. Do not crush    [provider]  vitamin B-12 (CYANOCOBALAMIN) 1000 MCG tablet Take 1,000 mcg by mouth daily.    [provider]    Family History Family History  Problem Relation Age of Onset  . Heart disease Mother        CAD  . COPD Mother   . Heart disease Father 73       leaky valve  . Parkinsonism Father   . Hypertension Other     Social History Social History  Substance Use Topics  . Smoking status: Never Smoker  . Smokeless tobacco: Never Used  . Alcohol use Yes     Comment: 05/30/2015 "I might take a drink of wine 1-2 times/yr"     Allergies   Penicillins and Sulfonamide derivatives   Review of Systems Review of Systems  Unable to perform ROS: Acuity of condition (difficulty speaking 2/2 active seizure )  Constitutional: Negative for chills, diaphoresis, fatigue and fever.  HENT: Negative for congestion and rhinorrhea.   Respiratory: Negative for chest tightness and shortness of breath.   Cardiovascular: Negative for chest pain.    Gastrointestinal: Negative for abdominal pain, diarrhea, nausea and vomiting.  Genitourinary: Negative for dysuria and flank pain.  Musculoskeletal: Negative for back pain and neck pain.  Skin: Negative for rash.  Neurological: Positive for seizures, facial asymmetry and weakness. Negative for headaches.  All other systems reviewed and are negative.    Physical Exam Updated Vital Signs BP 124/78 (BP Location: Right Arm)   Pulse 83   Temp 98.6 F (37 C) (Oral)   Resp 18   SpO2 95%   Physical Exam  Constitutional: She is oriented to person, place, and time. She appears well-developed and well-nourished. No distress.  HENT:  Head: Normocephalic and atraumatic.  Mouth/Throat: Oropharynx is clear and moist. No oropharyngeal exudate.  Eyes: Conjunctivae and EOM are normal. Pupils are  equal, round, and reactive to light.  Neck: Normal range of motion. Neck supple.  Cardiovascular: Normal rate and regular rhythm.   No murmur heard. Pulmonary/Chest: Effort normal and breath sounds normal. No stridor. No respiratory distress.  Abdominal: Soft. There is no tenderness.  Musculoskeletal: She exhibits no edema.  Neurological: She is alert and oriented to person, place, and time. A cranial nerve deficit is present. No sensory deficit. She exhibits abnormal muscle tone. She displays seizure activity. Coordination normal. GCS eye subscore is 4. GCS verbal subscore is 5. GCS motor subscore is 6.  Left facial droop. Left grip strength decreased compared to right. Facial twitching on left side.  Skin: Skin is warm and dry. Capillary refill takes less than 2 seconds. No rash noted. She is not diaphoretic. No erythema.  Psychiatric: She has a normal mood and affect.  Nursing note and vitals reviewed.    ED Treatments / Results  Labs (all labs ordered are listed, but only abnormal results are displayed) Labs Reviewed  COMPREHENSIVE METABOLIC PANEL - Abnormal; Notable for the following:        Result Value   Potassium 2.8 (*)    Glucose, Bld 143 (*)    All other components within normal limits  URINALYSIS, ROUTINE W REFLEX MICROSCOPIC - Abnormal; Notable for the following:    APPearance HAZY (*)    Leukocytes, UA SMALL (*)    All other components within normal limits  URINALYSIS, MICROSCOPIC (REFLEX) - Abnormal; Notable for the following:    Bacteria, UA MANY (*)    Squamous Epithelial / LPF 0-5 (*)    All other components within normal limits  MAGNESIUM - Abnormal; Notable for the following:    Magnesium 1.1 (*)    All other components within normal limits  I-STAT CHEM 8, ED - Abnormal; Notable for the following:    Potassium 2.8 (*)    BUN 22 (*)    Glucose, Bld 140 (*)    All other components within normal limits  CBG MONITORING, ED - Abnormal; Notable for the following:    Glucose-Capillary 134 (*)    All other components within normal limits  ETHANOL  PROTIME-INR  APTT  CBC  DIFFERENTIAL  RAPID URINE DRUG SCREEN, HOSP PERFORMED  I-STAT TROPOININ, ED    EKG  EKG Interpretation  Date/Time:  Wednesday Jan 06 2017 16:03:56 EDT Ventricular Rate:  72 PR Interval:    QRS Duration: 98 QT Interval:  456 QTC Calculation: 500 R Axis:   1 Text Interpretation:  Sinus rhythm Low voltage, precordial leads LVH with secondary repolarization abnormality Borderline prolonged QT interval No STEMI Confirmed by Antony Blackbird [phone]) on 01/06/2017 5:11:21 PM       Radiology Mr Brain W And Wo Contrast  Result Date: 01/06/2017 CLINICAL DATA:  Seizures, expressive aphasia, LEFT arm weakness today; unable to take Keppra due to vomiting. History of breast cancer, on radiation with known brain metastasis. EXAM: MRI HEAD WITHOUT AND WITH CONTRAST TECHNIQUE: Multiplanar, multiecho pulse sequences of the brain and surrounding structures were obtained without and with intravenous contrast. CONTRAST:  64mL MULTIHANCE GADOBENATE DIMEGLUMINE 529 MG/ML IV SOLN COMPARISON:  CT HEAD Jan 06, 2017 at 1432 hours and MRI of the head November 12, 2016 FINDINGS: INTRACRANIAL CONTENTS: Faint reduced diffusion associated with enlarging RIGHT temporal lobe metastasis. No reduced diffusion to suggest acute ischemia. Susceptibility artifact associated with RIGHT temporal lobe resection cavity, stable. RIGHT anterior temporal pole 13 x 14 mm metastasis was 4 x  6 mm. RIGHT posterior temporal lobe 9 x 11 mm metastasis was 4 x 5 mm. RIGHT posterior temporal dural based 9 x 15 mm metastasis was 3 x 6 mm. RIGHT parietal dural based 5 x 11 mm metastasis was 3 x 6 mm. LEFT parietal lobe stable 7 x 8 mm metastasis. 18 x 29 mm thickened irregular enhancement along the margin of RIGHT temporal lobe resection cavity was 17 x 28 mm, with thicker inferior component consistent with disease progression. No midline shift. Increasing vasogenic edema RIGHT temporal lobe in a background of mild chronic small vessel ischemic disease. Normal symmetric size, morphology and signal of the hippocampi. Ventricles and sulci are overall normal for patient's age. RIGHT frontal lobe developmental venous anomaly. No abnormal extra-axial fluid collections. VASCULAR: Normal major intracranial vascular flow voids present at skull base. SKULL AND UPPER CERVICAL SPINE: No abnormal sellar expansion. RIGHT frontotemporal craniotomy. No suspicious calvarial bone marrow signal. Craniocervical junction maintained. SINUSES/ORBITS: The mastoid air-cells and included paranasal sinuses are well-aerated.The included ocular globes and orbital contents are non-suspicious. OTHER: None. IMPRESSION: No acute intracranial process. Disease progression November 12, 2016: Stable number though increased size of multiple parenchymal and dural metastasis, majority in RIGHT temporal lobe. Worsening vasogenic edema RIGHT temporal lobe, which could contribute to seizures. No midline shift. Electronically Signed   By: Elon Alas M.D.   On: 01/06/2017 23:01   Ct Head  Code Stroke W/o Cm  Result Date: 01/06/2017 CLINICAL DATA:  Code stroke.  Aphasia. EXAM: CT HEAD WITHOUT CONTRAST TECHNIQUE: Contiguous axial images were obtained from the base of the skull through the vertex without intravenous contrast. COMPARISON:  07/10/2016 FINDINGS: Brain: Moderate low-density in the right temporal lobe in this patient with treated metastasis in this location. This moderate cytotoxic type edema has a stable extent given cross modality differences. There is also a left parietal metastasis by brain MRI, not clearly seen today. Detection of acute infarct is limited by streak artifact and motion. In places the posterior left insula and medial temporal cortex is difficult to visualize, but this is favored secondary to streak artifact based on reformats. Small area of nonvisualized cortex along the lateral posterior left frontal lobe is also in an area of streak artifact. No hemorrhage or hydrocephalus. No atrophy. Vascular: No atherosclerotic calcification or hyperdense vessel. Skull: Right pterional craniotomy.  Hyperostosis interna. Sinuses/Orbits: No acute finding Other:  A page has been placed to Dr. Cheral Marker 01/06/2017  at 4:06 pm ASPECTS [facility] Stroke Program Early CT Score) Not scored given the vasogenic edema on the right. IMPRESSION: 1. Known treated right temporal lobe metastasis with moderate vasogenic edema that is similar to 11/12/2016 brain MRI. A known left parietal metastasis is not visible on this study. 2. Motion and streak artifact limits detection of acute infarct. No convincing acute ischemia or hyperdense vessel. No acute hemorrhage. Electronically Signed   By: Monte Fantasia M.D.   On: 01/06/2017 16:10    Procedures Procedures (including critical care time)  Medications Ordered in ED Medications  LORazepam (ATIVAN) 2 MG/ML injection (not administered)  levETIRAcetam (KEPPRA) 1,000 mg in sodium chloride 0.9 % 100 mL IVPB (1,000 mg Intravenous New Bag/Given 01/06/17  1558)  LORazepam (ATIVAN) 2 MG/ML injection (not administered)  LORazepam (ATIVAN) injection 2 mg (2 mg Intravenous Given 01/06/17 1543)  LORazepam (ATIVAN) injection 1 mg (1 mg Intravenous Given 01/06/17 1554)     Initial Impression / Assessment and Plan / ED Course  I have reviewed the triage vital signs  and the nursing notes.  Pertinent labs & imaging results that were available during my care of the patient were reviewed by me and considered in my medical decision making (see chart for details).     Savannah Howard is a 71 y.o. female with a past medical history significant for brain cancer with metastasis to the brain status post radiation therapy, hypertension, and GERD who presents as a code stroke for left-sided facial droop, expressive aphasia, and facial twitching last normal at 11 AM. Patient has a history of seizures and takes Keppra 750 mg twice a day. According to both patient and family, she was at her baseline this morning and had just cooked eggs when she had onset of symptoms at 11 AM. Patient was unable to speak clearly but is able to utter some words. Patient found to have partial seizures on the left side of her face on arrival. Patient given 2 mg of Ativan in the CT scanner during code stroke imaging. One more milligram of Ativan was given and 1 g of Keppra was started.  Patient and family say that she has had no preceding symptoms. She denies recent fevers, chills, chest pain, shortness of breath, nausea, vomiting, conservation, diarrhea, dysuria. And was able to provide much of the history as patient is having difficulty speaking. Patient and family deny any recent falls or traumas.  On my exam, patient has left-sided facial droop. Patient also has decreased grip strength on the left side. Patient has normal sensation throughout all extremity. Normal and symmetric strength and sensation in the legs. Lungs were clear. Abdomen nontender. No nuchal rigidity. Patient has symmetric  pupils and normal extraocular movement.  Neurology team recommended 1 more milligram of Ativan) seizures continue. They will reassess the patient after her Keppra and determine disposition.  Anticipate admission for further management of partial seizures and further workup of possible stroke. Anticipate following recommendations in regards to steroids if there is edema from her brain metastasis intermittent symptoms.  Patient was found to have hypokalemia, IV potassium ordered as patient was unable to take oral medications at home due to the facial and speech difficulties.  Neurology recommended admission to hospitalist service for further management. Patient's twitching of the face improved and difficulty with speech also improved. Patient's left facial droop slightly improved from arrival.  Patient admitted to hospitalist service for further management of seizure and possible stroke.   Final Clinical Impressions(s) / ED Diagnoses   Final diagnoses:  Left-sided weakness  Facial droop  Seizures ([facility])    Clinical Impression: 1. Left-sided weakness   2. Facial droop   3. Seizures ([place])     Disposition: Admit to Hospitalist service      Tegeler, Gwenyth Allegra, MD 01/07/17 [phone]

## 2017-01-06 NOTE — ED Notes (Signed)
Dr. Tegeler at bedside.  

## 2017-01-06 NOTE — ED Notes (Signed)
Pt being transported to MRI at this time.

## 2017-01-06 NOTE — ED Notes (Signed)
Report attempted 

## 2017-01-06 NOTE — Progress Notes (Signed)
Pt admitted from ED with stroke like symptoms, pt alert and oriented with slurred speech and delayed response, pt settled in room with family and call light at bedside, tele monitor put and verified on pt, was however reassured and will continue to monitor, v/s stable. Obasogie-Asidi, Niels Cranshaw Efe

## 2017-01-06 NOTE — Code Documentation (Signed)
71yo female arriving to [facility] at 13 via [place].  Patient from home where she had sudden onset aphasia at 51.  Patient with h/o breast cancer and brain tumors.  EMS called and activated a code stroke.  Stroke team at the bedside on patient arrival.  Patient with left facial twitching on arrival.  Patient to CT with team.  Labs drawn in CT.  CT completed.  Ativan 2mg  IVP given.  NIHSS 8, see documentation for details and code stroke times.  Patient with difficulty speaking, bilateral leg drift and left facial droop on exam. Patient with continued facial twitching.  No acute stroke treatment at this time.  Code stroke canceled.  Bedside handoff with ED RN Threasa Beards.

## 2017-01-06 NOTE — ED Notes (Signed)
Pt still at MRI. This RN called MRI to ask them to please transport pt to inpatient bed 5C13 when MRI has been completed.

## 2017-01-07 ENCOUNTER — Telehealth: Payer: Self-pay | Admitting: Urology

## 2017-01-07 DIAGNOSIS — E876 Hypokalemia: Secondary | ICD-10-CM | POA: Diagnosis not present

## 2017-01-07 DIAGNOSIS — I10 Essential (primary) hypertension: Secondary | ICD-10-CM

## 2017-01-07 DIAGNOSIS — K219 Gastro-esophageal reflux disease without esophagitis: Secondary | ICD-10-CM | POA: Diagnosis present

## 2017-01-07 DIAGNOSIS — Z923 Personal history of irradiation: Secondary | ICD-10-CM | POA: Diagnosis not present

## 2017-01-07 DIAGNOSIS — Z79899 Other long term (current) drug therapy: Secondary | ICD-10-CM | POA: Diagnosis not present

## 2017-01-07 DIAGNOSIS — C7931 Secondary malignant neoplasm of brain: Principal | ICD-10-CM

## 2017-01-07 DIAGNOSIS — R569 Unspecified convulsions: Secondary | ICD-10-CM | POA: Diagnosis not present

## 2017-01-07 DIAGNOSIS — C50919 Malignant neoplasm of unspecified site of unspecified female breast: Secondary | ICD-10-CM | POA: Diagnosis present

## 2017-01-07 DIAGNOSIS — R739 Hyperglycemia, unspecified: Secondary | ICD-10-CM | POA: Diagnosis present

## 2017-01-07 DIAGNOSIS — G40909 Epilepsy, unspecified, not intractable, without status epilepticus: Secondary | ICD-10-CM | POA: Diagnosis not present

## 2017-01-07 DIAGNOSIS — C50911 Malignant neoplasm of unspecified site of right female breast: Secondary | ICD-10-CM

## 2017-01-07 DIAGNOSIS — R4701 Aphasia: Secondary | ICD-10-CM | POA: Diagnosis present

## 2017-01-07 HISTORY — DX: Hypomagnesemia: E83.42

## 2017-01-07 HISTORY — DX: Epilepsy, unspecified, not intractable, without status epilepticus: G40.909

## 2017-01-07 LAB — BASIC METABOLIC PANEL
Anion gap: 9 (ref 5–15)
BUN: 14 mg/dL (ref 6–20)
CHLORIDE: 108 mmol/L (ref 101–111)
CO2: 22 mmol/L (ref 22–32)
Calcium: 9.5 mg/dL (ref 8.9–10.3)
Creatinine, Ser: 0.72 mg/dL (ref 0.44–1.00)
Glucose, Bld: 94 mg/dL (ref 65–99)
POTASSIUM: 4.3 mmol/L (ref 3.5–5.1)
SODIUM: 139 mmol/L (ref 135–145)

## 2017-01-07 LAB — MAGNESIUM: MAGNESIUM: 1.6 mg/dL — AB (ref 1.7–2.4)

## 2017-01-07 MED ORDER — MAGNESIUM SULFATE 2 GM/50ML IV SOLN
2.0000 g | Freq: Once | INTRAVENOUS | Status: AC
  Administered 2017-01-07: 2 g via INTRAVENOUS
  Filled 2017-01-07: qty 50

## 2017-01-07 MED ORDER — AMLODIPINE BESYLATE 10 MG PO TABS
10.0000 mg | ORAL_TABLET | Freq: Every day | ORAL | Status: DC
  Administered 2017-01-07 – 2017-01-08 (×2): 10 mg via ORAL
  Filled 2017-01-07 (×2): qty 1

## 2017-01-07 MED ORDER — POTASSIUM CHLORIDE 10 MEQ/100ML IV SOLN
10.0000 meq | INTRAVENOUS | Status: AC
  Administered 2017-01-07 (×6): 10 meq via INTRAVENOUS
  Filled 2017-01-07 (×6): qty 100

## 2017-01-07 MED ORDER — DEXAMETHASONE SODIUM PHOSPHATE 10 MG/ML IJ SOLN
10.0000 mg | Freq: Two times a day (BID) | INTRAMUSCULAR | Status: DC
  Administered 2017-01-07 – 2017-01-08 (×3): 10 mg via INTRAVENOUS
  Filled 2017-01-07 (×3): qty 1

## 2017-01-07 MED ORDER — DEXAMETHASONE SODIUM PHOSPHATE 10 MG/ML IJ SOLN: 10.0000 mg | Freq: Two times a day (BID) | INTRAMUSCULAR | Status: DC

## 2017-01-07 NOTE — Progress Notes (Signed)
@IPLOG         PROGRESS NOTE                                                                                                                                                                                                             Patient Demographics:    Savannah Howard, is a 71 y.o. female, DOB - 06-23-1946, JSH:[id number]  Admit date - 01/06/2017   Admitting Physician Karmen Bongo, MD  Outpatient Primary MD for the patient is [name], [name], MD  LOS - 0  Chief Complaint  Patient presents with  . Code Stroke       Brief Narrative  Savannah Howard is a 71 y.o. 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   Subjective:    Savannah Howard today has, No headache, No chest pain, No abdominal pain - No Nausea, No new weakness tingling or numbness, No Cough - SOB.     Assessment  & Plan :     1. Seizures due to increase in right temporal lobe metastatic burden with some vasogenic edema - coming from metastatic breast cancer, she has had radiation therapy in the past for the same, continue Keppra, neurology has seen the patient. Have started her on Decadron as she has vasogenic edema, stopped Lovenox. Initiate activity and PTT. If seizure-free discharge tomorrow. Have discussed the case with radiation oncologist Dr. Tammi Klippel he will arrange for outpatient MRI and radiation treatments. She will be discharged on oral Keppra and Decadron tomorrow if stable.  2. Metastatic breast cancer. Treatment as in #1 above, will follow with radiation oncologist Dr. Tammi Klippel and her primary oncologist Dr.Gudena after discharge. For now continue armidex.  3. HTN -  on beta blocker will add Norvasc for better control .  4. Hypokalemia. Replaced and stable.  5. GERD. On PPI.   Diet : DIET SOFT Room service appropriate? Yes; Fluid consistency: Thin    Family  Communication  :  None  Code Status :  Full  Disposition Plan  :  TBD  Consults  :  Neuro, Onco, Rad Onc  Procedures  :   CT and MRI brain - with increase in right temporal lobe metastatic burden with some vasogenic edema  DVT Prophylaxis  :   SCDs    Lab Results  Component Value Date   PLT 279 01/06/2017    Inpatient Medications  Scheduled Meds: . anastrozole  1  mg Oral Daily  . dexamethasone  10 mg Intravenous Q12H  . metoprolol succinate  25 mg Oral Daily  . pantoprazole  40 mg Oral Daily  . terazosin  5 mg Oral QHS  . vitamin B-12  1,000 mcg Oral Daily   Continuous Infusions: . levETIRAcetam Stopped (01/07/17 0140)   PRN Meds:.acetaminophen **OR** acetaminophen, LORazepam, LORazepam, ondansetron **OR** ondansetron (ZOFRAN) IV  Antibiotics  :    Anti-infectives    None         Objective:   Vitals:   01/07/17 0200 01/07/17 0400 01/07/17 0600 01/07/17 0800  BP: (!) 171/99 (!) 172/74 (!) 169/74 (!) 187/61  Pulse: 71 65 65 63  Resp: 20 20 18 18   Temp:    98.4 F (36.9 C)  TempSrc:    Oral  SpO2: 97% 98% 96% 98%  Weight:      Height:        Wt Readings from Last 3 Encounters:  01/07/17 90 kg (198 lb 6.6 oz)  11/16/16 89.4 kg (197 lb)  10/22/16 93 kg (205 lb)     Intake/Output Summary (Last 24 hours) at 01/07/17 1033 Last data filed at 01/07/17 0500  Gross per 24 hour  Intake          1078.75 ml  Output              400 ml  Net           678.75 ml     Physical Exam  Awake Alert, Oriented X 3, No new F.N deficits, Normal affect,  does have underlying tremors [place].AT,PERRAL Supple Neck,No JVD, No cervical lymphadenopathy appriciated.  Symmetrical Chest wall movement, Good air movement bilaterally, CTAB RRR,No Gallops,Rubs or new Murmurs, No Parasternal Heave +ve B.Sounds, Abd Soft, No tenderness, No organomegaly appriciated, No rebound - guarding or rigidity. No Cyanosis, Clubbing or edema, No new Rash or bruise      Data Review:     CBC  Recent Labs Lab 01/06/17 1553 01/06/17 1559  WBC 9.2  --   HGB 13.4 13.9  HCT 40.1 41.0  PLT 279  --   MCV 98.3  --   MCH 32.8  --   MCHC 33.4  --   RDW 13.3  --   LYMPHSABS 2.8  --   MONOABS 0.7  --   EOSABS 0.2  --   BASOSABS 0.0  --     Chemistries   Recent Labs Lab 01/06/17 1553 01/06/17 1559 01/07/17 0758  NA 139 142 139  K 2.8* 2.8* 4.3  CL 105 102 108  CO2 22  --  22  GLUCOSE 143* 140* 94  BUN 18 22* 14  CREATININE 0.93 0.90 0.72  CALCIUM 9.9  --  9.5  MG 1.1*  --  1.6*  AST 25  --   --   ALT 18  --   --   ALKPHOS 43  --   --   BILITOT 0.5  --   --    ------------------------------------------------------------------------------------------------------------------ No results for input(s): CHOL, HDL, LDLCALC, TRIG, CHOLHDL, LDLDIRECT in the last 72 hours.  Lab Results  Component Value Date   HGBA1C 6.4 11/05/2014   ------------------------------------------------------------------------------------------------------------------ No results for input(s): TSH, T4TOTAL, T3FREE, THYROIDAB in the last 72 hours.  Invalid input(s): FREET3 ------------------------------------------------------------------------------------------------------------------ No results for input(s): VITAMINB12, FOLATE, FERRITIN, TIBC, IRON, RETICCTPCT in the last 72 hours.  Coagulation profile  Recent Labs Lab 01/06/17 1553  INR 0.99    No results for  input(s): DDIMER in the last 72 hours.  Cardiac Enzymes No results for input(s): CKMB, TROPONINI, MYOGLOBIN in the last 168 hours.  Invalid input(s): CK ------------------------------------------------------------------------------------------------------------------    Component Value Date/Time   BNP 222.2 (H) 07/10/2016 9024    Micro Results No results found for this or any previous visit (from the past 240 hour(s)).  Radiology Reports Mr Jeri [facility] And [facility] Contrast  Result Date: 01/06/2017 CLINICAL DATA:   Seizures, expressive aphasia, LEFT arm weakness today; unable to take Keppra due to vomiting. History of breast cancer, on radiation with known brain metastasis. EXAM: MRI HEAD WITHOUT AND WITH CONTRAST TECHNIQUE: Multiplanar, multiecho pulse sequences of the brain and surrounding structures were obtained without and with intravenous contrast. CONTRAST:  7mL MULTIHANCE GADOBENATE DIMEGLUMINE 529 MG/ML IV SOLN COMPARISON:  CT HEAD Jan 06, 2017 at 1432 hours and MRI of the head November 12, 2016 FINDINGS: INTRACRANIAL CONTENTS: Faint reduced diffusion associated with enlarging RIGHT temporal lobe metastasis. No reduced diffusion to suggest acute ischemia. Susceptibility artifact associated with RIGHT temporal lobe resection cavity, stable. RIGHT anterior temporal pole 13 x 14 mm metastasis was 4 x 6 mm. RIGHT posterior temporal lobe 9 x 11 mm metastasis was 4 x 5 mm. RIGHT posterior temporal dural based 9 x 15 mm metastasis was 3 x 6 mm. RIGHT parietal dural based 5 x 11 mm metastasis was 3 x 6 mm. LEFT parietal lobe stable 7 x 8 mm metastasis. 18 x 29 mm thickened irregular enhancement along the margin of RIGHT temporal lobe resection cavity was 17 x 28 mm, with thicker inferior component consistent with disease progression. No midline shift. Increasing vasogenic edema RIGHT temporal lobe in a background of mild chronic small vessel ischemic disease. Normal symmetric size, morphology and signal of the hippocampi. Ventricles and sulci are overall normal for patient's age. RIGHT frontal lobe developmental venous anomaly. No abnormal extra-axial fluid collections. VASCULAR: Normal major intracranial vascular flow voids present at skull base. SKULL AND UPPER CERVICAL SPINE: No abnormal sellar expansion. RIGHT frontotemporal craniotomy. No suspicious calvarial bone marrow signal. Craniocervical junction maintained. SINUSES/ORBITS: The mastoid air-cells and included paranasal sinuses are well-aerated.The included ocular  globes and orbital contents are non-suspicious. OTHER: None. IMPRESSION: No acute intracranial process. Disease progression November 12, 2016: Stable number though increased size of multiple parenchymal and dural metastasis, majority in RIGHT temporal lobe. Worsening vasogenic edema RIGHT temporal lobe, which could contribute to seizures. No midline shift. Electronically Signed   By: Elon Alas M.D.   On: 01/06/2017 23:01   Ct Head Code Stroke W/o Cm  Result Date: 01/06/2017 CLINICAL DATA:  Code stroke.  Aphasia. EXAM: CT HEAD WITHOUT CONTRAST TECHNIQUE: Contiguous axial images were obtained from the base of the skull through the vertex without intravenous contrast. COMPARISON:  07/10/2016 FINDINGS: Brain: Moderate low-density in the right temporal lobe in this patient with treated metastasis in this location. This moderate cytotoxic type edema has a stable extent given cross modality differences. There is also a left parietal metastasis by brain MRI, not clearly seen today. Detection of acute infarct is limited by streak artifact and motion. In places the posterior left insula and medial temporal cortex is difficult to visualize, but this is favored secondary to streak artifact based on reformats. Small area of nonvisualized cortex along the lateral posterior left frontal lobe is also in an area of streak artifact. No hemorrhage or hydrocephalus. No atrophy. Vascular: No atherosclerotic calcification or hyperdense vessel. Skull: Right pterional craniotomy.  Hyperostosis interna. Sinuses/Orbits:  No acute finding Other:  A page has been placed to Dr. Cheral Marker 01/06/2017  at 4:06 pm ASPECTS [facility] Stroke Program Early CT Score) Not scored given the vasogenic edema on the right. IMPRESSION: 1. Known treated right temporal lobe metastasis with moderate vasogenic edema that is similar to 11/12/2016 brain MRI. A known left parietal metastasis is not visible on this study. 2. Motion and streak artifact limits  detection of acute infarct. No convincing acute ischemia or hyperdense vessel. No acute hemorrhage. Electronically Signed   By: Monte Fantasia M.D.   On: 01/06/2017 16:10    Time Spent in minutes  30   Lala Lund M.D on 01/07/2017 at 10:33 AM  Between 7am to 7pm - Pager - [phone] ( page via [email or web address], text pages only, please mention full 10 digit call back number). After 7pm go to [email or web address] - password [facility]

## 2017-01-07 NOTE — Telephone Encounter (Signed)
I spoke with the patient to let her know that Dr. Tammi Klippel is aware of her recent admission for seizure activity and MRI brain demonstrating disease progression in the right temporal lobe.  Dr, Tammi Klippel and I reviewed her MRI images and he has recommended that she be discharged, when appropriate, on Decadron 4mg  po QID and Keppra and we will make arrangements for an outpatient SRS protocol MRI at [place] in preparation for repeat fractionated SRS to the right temporal lobe lesions.  I shared that we anticipate a course of 5 treatments with fractionated SRS.  Her case will be presented at the multidisciplinary brain conference on 01/11/17.  She states her understanding and agreement with this plan.    I shared this information with her attending physician, Dr. Candiss Norse and will also inform Dr. Cyndy Freeze and Dr. Lindi Adie.

## 2017-01-07 NOTE — Progress Notes (Addendum)
[place] NOTE  Pharmacy consult:  for DDI and monitoring of antiepileptic medications.   Allergies  Allergen Reactions  . Penicillins Swelling and Other (See Comments)    Genital swelling/skin peeled/yeast infection Has patient had a PCN reaction causing immediate rash, facial/tongue/throat swelling, SOB or lightheadedness with hypotension: Yes Has patient had a PCN reaction causing severe rash involving mucus membranes or skin necrosis: Unknown Has patient had a PCN reaction that required hospitalization: Unknown Has patient had a PCN reaction occurring within the last 10 years: Unknown If all of the above answers are "NO", then may proceed with   . Sulfonamide Derivatives Other (See Comments)    Convulsions      Patient Measurements: Height: 5\' 3"  (160 cm) Weight: 198 lb 6.6 oz (90 kg) IBW/kg (Calculated) : 52.4   Vital Signs: Temp: 98.5 F (36.9 C) (05/31 1054) Temp Source: Oral (05/31 1054) BP: 163/66 (05/31 1054) Pulse Rate: 65 (05/31 1054) Intake/Output from previous day: 05/30 0701 - 05/31 0700 In: 1078.8 [I.V.:308.8; IV Piggyback:770] Out: 400 [Urine:400] Intake/Output from this shift: No intake/output data recorded.  Labs:  Recent Labs  01/06/17 1553 01/06/17 1559 01/07/17 0758  WBC 9.2  --   --   HGB 13.4 13.9  --   HCT 40.1 41.0  --   PLT 279  --   --   APTT 29  --   --   CREATININE 0.93 0.90 0.72  MG 1.1*  --  1.6*  ALBUMIN 3.5  --   --   PROT 7.1  --   --   AST 25  --   --   ALT 18  --   --   ALKPHOS 43  --   --   BILITOT 0.5  --   --    Estimated Creatinine Clearance: 68.6 mL/min (by C-G formula based on SCr of 0.72 mg/dL).   Microbiology: No results found for this or any previous visit (from the past 720 hour(s)).  Medical History: Past Medical History:  Diagnosis Date  . Allergy    rhinitis  . Anemia   . Anxiety   . Brain cancer ([place]) 06/2016   breast ca with mets to brain  . DDD (degenerative disc disease),  lumbar dx'd in 1980's  . GERD (gastroesophageal reflux disease)   . Heart murmur   . History of blood transfusion 2016 X 3 (05/30/2015)  . Hypertension   . Kidney stone 1990's X 1   "passed it"  . Obesity   . 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 Vitamin B 12 deficiency   . Vitamin D deficiency     Medications:  Prescriptions Prior to Admission  Medication Sig Dispense Refill Last Dose  . acetaminophen (TYLENOL) 500 MG tablet Take 1,000 mg by mouth at bedtime as needed for mild pain or headache.    PRN at PRN  . anastrozole (ARIMIDEX) 1 MG tablet TAKE 1 TABLET (1 MG TOTAL) BY MOUTH DAILY. 90 tablet 2 01/05/2017 at am  . cyanocobalamin (,VITAMIN B-12,) 1000 MCG/ML injection Inject 1,000 mcg into the muscle every 30 (thirty) days.   Past Month at Unknown time  . LORazepam (ATIVAN) 0.5 MG tablet TAKE 1 TABLET BY MOUTH 3 TIMES A DAY AS NEEDED FOR ANXIETY 270 tablet 0 01/05/2017 at pm  . losartan-hydrochlorothiazide (HYZAAR) 100-25 MG tablet TAKE 1 TABLET BY MOUTH EVERY DAY 90 tablet 0 01/05/2017 at Unknown time  . metoprolol succinate (TOPROL-XL)  50 MG 24 hr tablet Take 25 mg by mouth daily.   01/05/2017 at 0500  . omeprazole (PRILOSEC) 20 MG capsule Take 20 mg by mouth daily.   01/05/2017 at am  . potassium chloride (MICRO-K) 10 MEQ CR capsule TAKE ONE CAPSULE BY MOUTH EVERY DAY 90 capsule 0 01/05/2017 at am  . terazosin (HYTRIN) 5 MG capsule Take 5 mg by mouth at bedtime. Do not crush   01/04/2017 at pm  . vitamin B-12 (CYANOCOBALAMIN) 1000 MCG tablet Take 1,000 mcg by mouth daily.   01/05/2017 at Unknown time   Scheduled:  . amLODipine  10 mg Oral Daily  . anastrozole  1 mg Oral Daily  . dexamethasone  10 mg Intravenous Q12H  . metoprolol succinate  25 mg Oral Daily  . pantoprazole  40 mg Oral Daily  . terazosin  5 mg Oral QHS  . vitamin B-12  1,000 mcg Oral Daily    Assessment: 71 y.o female admitted 5/30 due to AMS due to seizures. Neurology  following: Seizures due to increase in right temporal lobe metastatic burden with some vasogenic edema - coming from metastatic breast cancer per Neurologist.  Currently on  Keppra 1000 mg IV BID for seizures on admit.  Pharmacy consulted 5/30 PM for DDI and monitoring of antiepileptic medications. I reviewed current inpatient meds and PTA meds:  No DDI's found No new seizures reported. MD notes today, pt has no new weakness tingling or numbness. MD's plan is to discharge on oral Keppra and Decadron tomorrow 6/1 if stable.  Plan:  No changes or recommendation needed.   Nicole Cella, RPh Clinical Pharmacist Pager: [phone]-4P [phone]-10P [phone] [place] [phone] 01/07/2017,11:27 AM

## 2017-01-07 NOTE — Evaluation (Signed)
Clinical/Bedside Swallow Evaluation Patient Details  Name: Savannah Howard MRN: [id number] Date of Birth: [birth year]  Today's Date: 01/07/2017 Time: SLP Start Time (ACUTE ONLY): 1000 SLP Stop Time (ACUTE ONLY): 1045 SLP Time Calculation (min) (ACUTE ONLY): 45 min  Past Medical History:  Past Medical History:  Diagnosis Date  . Allergy    rhinitis  . Anemia   . Anxiety   . Brain cancer ([place]) 06/2016   breast ca with mets to brain  . DDD (degenerative disc disease), lumbar dx'd in 1980's  . GERD (gastroesophageal reflux disease)   . Heart murmur   . History of blood transfusion 2016 X 3 (05/30/2015)  . Hypertension   . Kidney stone 1990's X 1   "passed it"  . Obesity   . 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 Vitamin B 12 deficiency   . Vitamin D deficiency    Past Surgical History:  Past Surgical History:  Procedure Laterality Date  . APPLICATION OF CRANIAL NAVIGATION N/A 07/13/2016   Procedure: APPLICATION OF CRANIAL NAVIGATION;  Surgeon: Kevan Ny Ditty, MD;  Location: [place];  Service: Neurosurgery;  Laterality: N/A;  . BREAST BIOPSY Left ~ 12/2014  . CHOLECYSTECTOMY OPEN  ~ 1975  . ESOPHAGEAL DILATION  X 3  . MASTECTOMY COMPLETE / SIMPLE W/ SENTINEL NODE BIOPSY Left 05/30/2015  . MASTECTOMY W/ SENTINEL NODE BIOPSY Left 05/30/2015   Procedure: LEFT MASTECTOMY WITH SENTINEL LYMPH NODE BIOPSY;  Surgeon: Autumn Messing III, MD;  Location: [place];  Service: General;  Laterality: Left;  . PORTACATH PLACEMENT Right 12/26/2014   Procedure: INSERTION PORT-A-CATH;  Surgeon: Autumn Messing III, MD;  Location: [place];  Service: General;  Laterality: Right;  . PR DURAL GRAFT REPAIR,SPINE DEFECT N/A 07/13/2016   Procedure: Marshia Ly STERIOTACTIC BIOPSY WITH STEALTH;  Surgeon: Kevan Ny Ditty, MD;  Location: [place];  Service: Neurosurgery;  Laterality: N/A;   HPI:  71 year old female admitted 01/06/17 due to [place]. PMH significant for  breast cancer with mets to brain, s/p radiation, GERD, HTN. MRI reveals no acute process.   Assessment / Plan / Recommendation Clinical Impression  Pt presents with natural dentition, adequate oral motor strength and function. Voice is hoarse, which pt reports is baseline, likely due to reflux. No overt s/s aspiration observed on any consistency, and pt reports no history of swallowing difficulty. Will begin regular diet and thin liquids. Encouraged upright position during and 30 minutes after meals. No further ST intervention recommended at this time. Please reconsult if needed.   SLP Visit Diagnosis: Dysphagia, unspecified (R13.10)    Aspiration Risk  Mild aspiration risk    Diet Recommendation Regular;Thin liquid   Liquid Administration via: Cup;Straw Medication Administration: Whole meds with liquid Supervision: Patient able to self feed Compensations: Minimize environmental distractions;Slow rate;Small sips/bites Postural Changes: Seated upright at 90 degrees;Remain upright for at least 30 minutes after po intake    Other  Recommendations Oral Care Recommendations: Oral care BID   Follow up Recommendations None             Prognosis Prognosis for Safe Diet Advancement: Good      Swallow Study   General Date of Onset: 01/06/17 HPI: 71 year old female admitted 01/06/17 due to AMS. PMH significant for breast cancer with mets to brain, s/p radiation, GERD, HTN. MRI reveals no acute process. Type of Study: Bedside Swallow Evaluation Previous Swallow Assessment: none found Diet Prior to this  Study: NPO Temperature Spikes Noted: No Respiratory Status: Room air History of Recent Intubation: No Behavior/Cognition: Alert;Cooperative;Pleasant mood Oral Cavity Assessment: Within Functional Limits Oral Care Completed by SLP: No Oral Cavity - Dentition: Adequate natural dentition Vision: Functional for self-feeding Self-Feeding Abilities: Able to feed self Patient Positioning:  Upright in bed Baseline Vocal Quality: Aphonic Volitional Cough: Strong Volitional Swallow: Able to elicit    Oral/Motor/Sensory Function Overall Oral Motor/Sensory Function: Within functional limits   Ice Chips Ice chips: Not tested   Thin Liquid Thin Liquid: Within functional limits Presentation: Straw    Nectar Thick Nectar Thick Liquid: Not tested   Honey Thick Honey Thick Liquid: Not tested   Puree Puree: Within functional limits Presentation: Spoon;Self Fed   Solid   GO   Solid: Not tested Presentation: Self Fed    Functional Assessment Tool Used: asha noms, clinical judgment, BSE Functional Limitations: Swallowing Swallow Current Status (Y1017): At least 1 percent but less than 20 percent impaired, limited or restricted Swallow Goal Status [id number]): At least 1 percent but less than 20 percent impaired, limited or restricted Swallow Discharge Status [id number]): At least 1 percent but less than 20 percent impaired, limited or restricted  Jona Zappone B. Quentin [facility] [place] [phone]  Shonna Chock 01/07/2017,10:54 AM

## 2017-01-07 NOTE — Progress Notes (Signed)
Subjective: Patient currently awake and oriented. Patient having no further seizures. Stating that she feels much better  Exam: Vitals:   01/07/17 0600 01/07/17 0800  BP: (!) 169/74 (!) 187/61  Pulse: 65 63  Resp: 18 18  Temp:  98.4 F (36.9 C)    HEENT-  Normocephalic, no lesions, without obvious abnormality.  Normal external eye and conjunctiva.  Normal TM's bilaterally.  Normal auditory canals and external ears. Normal external nose, mucus membranes and septum.  Normal pharynx.    Neuro:  CN: Pupils are equal and round. They are symmetrically reactive from 3-->2 mm. EOMI without nystagmus. Facial sensation is intact to light touch. Face is symmetric at rest with normal strength and mobility. Hearing is intact to conversational voice. Palate elevates symmetrically and uvula is midline. Voice is normal in tone, pitch and quality. Bilateral SCM and trapezii are 5/5. Tongue is midline with normal bulk and mobility.  Motor: Normal bulk, tone, and strength. 5/5 throughout. No drift.  Sensation: Intact to light touch.  DTRs: 2+, symmetric  Toes downgoing bilaterally. No pathologic reflexes.  Coordination: Finger-to-nose and heel-to-shin are without dysmetria     Pertinent Labs/Diagnostics: MRI:   IMPRESSION: No acute intracranial process.  Disease progression November 12, 2016: Stable number though increased size of multiple parenchymal and dural metastasis, majority in RIGHT temporal lobe. Worsening vasogenic edema RIGHT temporal lobe, which could contribute to seizures. No midline shift   Impression: Breakthrough seizure in the setting of missed dose of Keppra in addition to increased size of multiple parenchymal and dural metastasis and worsening vasogenic edema. After receiving load of Keppra and home doses of Keppra IV no further seizures.   Recommendations: 1) continue current dose of Keppra when able to take oral medication may change to by mouth as it is a 121 ratio 2) would  recommend oncology consult for follow-up  Neurology will sign off at this time    01/07/2017, 9:54 AM Etta Quill PA-C [facility] [phone]

## 2017-01-07 NOTE — Progress Notes (Signed)
CSW consulted for possible ALF placement. Per RNCM, pt is not interested in ALF placement at this time. CSW not needed for consult.  CSW signing off.  Laveda Abbe LCSW [phone]

## 2017-01-07 NOTE — Evaluation (Signed)
Physical Therapy Evaluation/Discharge Patient Details Name: ANALEAH BRAME MRN: [id number] DOB: 05-20-1946 Today's Date: 01/07/2017   History of Present Illness  71 yo admitted after Sz with AMS. 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 Pt reports she has a small efficiency apartment, manages her meds, meals and bills on her own with friend assist for errands and driving. Pt sits to bathe and dress and has necessary DME at home. Pt oriented and able to answer basic safety and math questions without difficulty. Pt at baseline mod I level without current need for physical therapy and agreeable to no further needs. Will sign off.     Follow Up Recommendations No PT follow up    Equipment Recommendations  None recommended by PT    Recommendations for Other Services       Precautions / Restrictions Precautions Precautions: Fall      Mobility  Bed Mobility Overal bed mobility: Modified Independent                Transfers Overall transfer level: Modified independent                  Ambulation/Gait Ambulation/Gait assistance: Modified independent (Device/Increase time) Ambulation Distance (Feet): 400 Feet Assistive device: Rolling walker (2 wheeled) Gait Pattern/deviations: Step-through pattern;Decreased stride length   Gait velocity interpretation: at or above normal speed for age/gender General Gait Details: pt with steady gait including head turns with use of RW  Stairs            Wheelchair Mobility    Modified Rankin (Stroke Patients Only)       Balance Overall balance assessment: Needs assistance   Sitting balance-Leahy Scale: Good       Standing balance-Leahy Scale: Fair                               Pertinent Vitals/Pain Pain Assessment: No/denies pain    Home Living  Family/patient expects to be discharged to:: Private residence Living Arrangements: Alone Available Help at Discharge: Friend(s);Available PRN/intermittently Type of Home: Apartment Home Access: Level entry     Home Layout: One level Home Equipment: Shower seat - built in;Walker - 2 wheels      Prior Function Level of Independence: Independent with assistive device(s)         Comments: doesn't use AD in apartment, uses RW in community or shopping cart at store, does not drive     Hand Dominance        Extremity/Trunk Assessment   Upper Extremity Assessment Upper Extremity Assessment: Overall WFL for tasks assessed    Lower Extremity Assessment Lower Extremity Assessment: Overall WFL for tasks assessed    Cervical / Trunk Assessment Cervical / Trunk Assessment: Normal  Communication   Communication: No difficulties  Cognition Arousal/Alertness: Awake/alert Behavior During Therapy: WFL for tasks assessed/performed Overall Cognitive Status: Within Functional Limits for tasks assessed                                        General Comments      Exercises     Assessment/Plan    PT Assessment Patent does not need any further PT  services  PT Problem List         PT Treatment Interventions      PT Goals (Current goals can be found in the Care Plan section)  Acute Rehab PT Goals Patient Stated Goal: go home PT Goal Formulation: All assessment and education complete, DC therapy    Frequency     Barriers to discharge        Co-evaluation               AM-PAC PT "6 Clicks" Daily Activity  Outcome Measure Difficulty turning over in bed (including adjusting bedclothes, sheets and blankets)?: None Difficulty moving from lying on back to sitting on the side of the bed? : None Difficulty sitting down on and standing up from a chair with arms (e.g., wheelchair, bedside commode, etc,.)?: None Help needed moving to and from a bed to chair  (including a wheelchair)?: None Help needed walking in hospital room?: None Help needed climbing 3-5 steps with a railing? : A Little 6 Click Score: 23    End of Session Equipment Utilized During Treatment: Gait belt Activity Tolerance: Patient tolerated treatment well Patient left: in bed;with call bell/phone within reach Nurse Communication: Mobility status PT Visit Diagnosis: Other abnormalities of gait and mobility (R26.89)    Time: 6546-5035 PT Time Calculation (min) (ACUTE ONLY): 15 min   Charges:   PT Evaluation $PT Eval Low Complexity: 1 Procedure     PT G Codes:   PT G-Codes **NOT FOR INPATIENT CLASS** Functional Assessment Tool Used: Clinical judgement;AM-PAC 6 Clicks Basic Mobility Functional Limitation: Mobility: Walking and moving around Mobility: Walking and Moving Around Current Status (W6568): At least 1 percent but less than 20 percent impaired, limited or restricted Mobility: Walking and Moving Around Goal Status [phone]): At least 1 percent but less than 20 percent impaired, limited or restricted Mobility: Walking and Moving Around Discharge Status [phone]): At least 1 percent but less than 20 percent impaired, limited or restricted    Elwyn Reach, [place]   Sandy Salaam Emnet Monk 01/07/2017, 12:56 PM

## 2017-01-07 NOTE — Care Management Note (Signed)
Case Management Note  Patient Details  Name: MY RINKE MRN: [id number] Date of Birth: 02/12/1946  Subjective/Objective:     Pt admitted with altered mental status. She has history of brain tumor. She has been staying in [facility]. Pt states she has a friend and cousins that are available to assist when needed. She states she can go to her friends home at discharge and stay until she is ready to be back on her own.  She states she has a walker at the [facility].  CM inquired about Assisted Living and she does not want to go to an ALF. CM also inquired about HH seeing her at d/c and she states she really does not want them either.                Action/Plan: Awaiting PT/OT recommendations. CM following for d/c needs, physician orders.   Expected Discharge Date:                  Expected Discharge Plan:  Home/Self Care  In-House Referral:  Clinical Social Work  Discharge planning Services     Post Acute Care Choice:    Choice offered to:     DME Arranged:    DME Agency:     HH Arranged:    [place] Agency:     Status of Service:  In process, will continue to follow  If discussed at Long Length of Stay Meetings, dates discussed:    Additional Comments:  Pollie Friar, RN 01/07/2017, 12:31 PM

## 2017-01-08 ENCOUNTER — Other Ambulatory Visit: Payer: Self-pay | Admitting: Radiation Therapy

## 2017-01-08 ENCOUNTER — Telehealth: Payer: Self-pay | Admitting: Internal Medicine

## 2017-01-08 DIAGNOSIS — C7931 Secondary malignant neoplasm of brain: Secondary | ICD-10-CM

## 2017-01-08 DIAGNOSIS — C7949 Secondary malignant neoplasm of other parts of nervous system: Principal | ICD-10-CM

## 2017-01-08 MED ORDER — DEXAMETHASONE 6 MG PO TABS: 6.0000 mg | ORAL_TABLET | Freq: Two times a day (BID) | ORAL | 0 refills | Status: DC

## 2017-01-08 MED ORDER — LEVETIRACETAM 1000 MG PO TABS: 1000.0000 mg | ORAL_TABLET | Freq: Two times a day (BID) | ORAL | 1 refills | Status: DC

## 2017-01-08 NOTE — Discharge Instructions (Signed)
Follow with Primary MD [name], [name], MD in 7 days   Get CBC, CMP, 2 view Chest X ray checked  by Primary MD or SNF MD in 5-7 days ( we routinely change or add medications that can affect your baseline labs and fluid status, therefore we recommend that you get the mentioned basic workup next visit with your PCP, your PCP may decide not to get them or add new tests based on their clinical decision)  Activity: As tolerated with Full fall precautions use walker/cane & assistance as needed  Disposition Home    Diet:  Heart Healthy   For Heart failure patients - Check your Weight same time everyday, if you gain over 2 pounds, or you develop in leg swelling, experience more shortness of breath or chest pain, call your Primary MD immediately. Follow Cardiac Low Salt Diet and 1.5 lit/day fluid restriction.  On your next visit with your primary care physician please Get Medicines reviewed and adjusted.  Please request your Prim.MD to go over all Hospital Tests and Procedure/Radiological results at the follow up, please get all Hospital records sent to your Prim MD by signing hospital release before you go home.  If you experience worsening of your admission symptoms, develop shortness of breath, life threatening emergency, suicidal or homicidal thoughts you must seek medical attention immediately by calling 911 or calling your MD immediately  if symptoms less severe.  You Must read complete instructions/literature along with all the possible adverse reactions/side effects for all the Medicines you take and that have been prescribed to you. Take any new Medicines after you have completely understood and accpet all the possible adverse reactions/side effects.   Do not drive, operate heavy machinery, perform activities at heights, swimming or participation in water activities or provide baby sitting services until you have seen by Primary MD or a Neurologist and advised to do so again.  Do not drive  when taking Pain medications.    Do not take more than prescribed Pain, Sleep and Anxiety Medications  Special Instructions: If you have smoked or chewed Tobacco  in the last 2 yrs please stop smoking, stop any regular Alcohol  and or any Recreational drug use.  Wear Seat belts while driving.   Please note  You were cared for by a hospitalist during your hospital stay. If you have any questions about your discharge medications or the care you received while you were in the hospital after you are discharged, you can call the unit and asked to speak with the hospitalist on call if the hospitalist that took care of you is not available. Once you are discharged, your primary care physician will handle any further medical issues. Please note that NO REFILLS for any discharge medications will be authorized once you are discharged, as it is imperative that you return to your primary care physician (or establish a relationship with a primary care physician if you do not have one) for your aftercare needs so that they can reassess your need for medications and monitor your lab values.

## 2017-01-08 NOTE — Plan of Care (Signed)
Problem: Education: Goal: Knowledge of disease or condition will improve Outcome: Progressing Patient aware of disease process, patient is very pleasant and hopeful about prognosis.   Problem: Nutrition: Goal: Risk of aspiration will decrease Outcome: Progressing Patient tolerated PO meds well. No s/s of aspiration noted this shift. Will continue with care  Problem: Medication: Goal: Risk for medication side effects will decrease Outcome: Progressing Patient tolerated medications well this shift. No s/s of adverse reaction to medications noted.

## 2017-01-08 NOTE — Telephone Encounter (Signed)
Would like to know if Dr. Alain Marion would sign off on Home Health orders for patient  States Dr. Camila Li was giving signing off on orders in Feb of 2018.  Patient was last seen by Dr. Alain Marion 01/29/2015.  Patient was just discharged from the [facility].  Please follow up in regard.

## 2017-01-08 NOTE — Care Management Note (Signed)
Case Management Note  Patient Details  Name: Savannah Howard MRN: [id number] Date of Birth: [birth year]  Subjective/Objective:                    Action/Plan: Pt discharging home with [facility] services. Pt was in agreement to having HH see her after d/c. Pt states she used [facility] services after her last rehab stay and wants to use the same company. CM was able to find that she used [facility]. [name] with [facility] notified and accepted the referral.  Pt unsure if going to stay at Extended Stay or at friend Jeanine's home. [facility] informed to clarify her location before sending person out.  Pt has transportation home and some assistance at home as needed.   Expected Discharge Date:  01/08/17               Expected Discharge Plan:  Home/Self Care  In-House Referral:  Clinical Social Work  Discharge planning Services  CM Consult  Post Acute Care Choice:  Home Health Choice offered to:  Patient  DME Arranged:    DME Agency:     HH Arranged:  RN, PT, Nurse's Aide, Social Work [employer] Agency:  [employer]  Status of Service:  Completed, signed off  If discussed at [employer], dates discussed:    Additional Comments:  Pollie Friar, RN 01/08/2017, 1:25 PM

## 2017-01-08 NOTE — Discharge Summary (Signed)
Savannah Howard SNK:[id number] DOB: 10/03/1945 DOA: 01/06/2017  PCP: [name], MD  Admit date: 01/06/2017  Discharge date: 01/08/2017  Admitted From: Home   Disposition:  Home   Recommendations for Outpatient Follow-up:   Follow up with PCP in 1-2 weeks  PCP Please obtain BMP/CBC, 2 view CXR in 1week,  (see Discharge instructions)   PCP Please follow up on the following pending results: None   Home Health: PT,RN,Aide, S Work   Equipment/Devices: None  Consultations: Neuro Discharge Condition: Fair   CODE STATUS: Full   Diet Recommendation:  Heart Healthy    Chief Complaint  Patient presents with  . 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                                                                 Hospital Course   1. Seizures due to increase in right temporal lobe metastatic burden with some vasogenic edema - Underlying stage IV metastatic breast cancer, she has had radiation therapy in the past for the same, was started on Keppra along with IV Decadron and now symptom-free, continue Keppra at 1000 BID, along with Decadron 6 mg oral twice a day. She was seen by neurology. We will follow outpatient with PCP, radiation oncology Dr. Tammi Klippel, case discussed with his staff, they will schedule another MRI and possibly radiation treatment to the brain soon. She will also follow with her primary oncologist Dr. Lindi Adie.   2. L. invasive ductal Metastatic breast cancer. Treatment as in #1 above, will follow with radiation oncologist  Dr. Tammi Klippel and her primary oncologist Dr.Gudena after discharge. For now continue armidex.  3. HTN -  Continue home regimen follow with PCP .  4. Hypokalemia. Replaced and stable.  5. GERD. On PPI.      Discharge diagnosis     Principal Problem:   Altered mental status Active Problems:   Hypertension   Breast cancer metastasized to brain ([place])   Hypokalemia   Hypomagnesemia   Hyperglycemia   Seizure disorder [facility])    Discharge instructions    Discharge Instructions    Diet - low sodium heart healthy    Complete by:  As directed    Discharge instructions    Complete by:  As directed    Follow with Primary MD Savannah, [name], MD in 7 days   Get CBC, CMP, 2 view Chest X ray checked  by Primary MD or SNF MD in 5-7 days ( we routinely change or add medications that can affect  your baseline labs and fluid status, therefore we recommend that you get the mentioned basic workup next visit with your PCP, your PCP may decide not to get them or add new tests based on their clinical decision)  Activity: As tolerated with Full fall precautions use walker/cane & assistance as needed  Disposition Home    Diet:  Heart Healthy   For Heart failure patients - Check your Weight same time everyday, if you gain over 2 pounds, or you develop in leg swelling, experience more shortness of breath or chest pain, call your Primary MD immediately. Follow Cardiac Low Salt Diet and 1.5 lit/day fluid restriction.  On your next visit with your primary care physician please Get Medicines reviewed and adjusted.  Please request your Prim.MD to go over all Hospital Tests and Procedure/Radiological results at the follow up, please get all Hospital records sent to your Prim MD by signing hospital release before you go home.  If you experience worsening of your admission symptoms, develop shortness of breath, life threatening emergency, suicidal or homicidal thoughts you must seek medical attention  immediately by calling 911 or calling your MD immediately  if symptoms less severe.  You Must read complete instructions/literature along with all the possible adverse reactions/side effects for all the Medicines you take and that have been prescribed to you. Take any new Medicines after you have completely understood and accpet all the possible adverse reactions/side effects.   Do not drive, operate heavy machinery, perform activities at heights, swimming or participation in water activities or provide baby sitting services until you have seen by Primary MD or a Neurologist and advised to do so again.  Do not drive when taking Pain medications.    Do not take more than prescribed Pain, Sleep and Anxiety Medications  Special Instructions: If you have smoked or chewed Tobacco  in the last 2 yrs please stop smoking, stop any regular Alcohol  and or any Recreational drug use.  Wear Seat belts while driving.   Please note  You were cared for by a hospitalist during your hospital stay. If you have any questions about your discharge medications or the care you received while you were in the hospital after you are discharged, you can call the unit and asked to speak with the hospitalist on call if the hospitalist that took care of you is not available. Once you are discharged, your primary care physician will handle any further medical issues. Please note that NO REFILLS for any discharge medications will be authorized once you are discharged, as it is imperative that you return to your primary care physician (or establish a relationship with a primary care physician if you do not have one) for your aftercare needs so that they can reassess your need for medications and monitor your lab values.   Increase activity slowly    Complete by:  As directed       Discharge Medications   Allergies as of 01/08/2017      Reactions   Penicillins Swelling, Other (See Comments)   Genital swelling/skin  peeled/yeast infection Has patient had a PCN reaction causing immediate rash, facial/tongue/throat swelling, SOB or lightheadedness with hypotension: Yes Has patient had a PCN reaction causing severe rash involving mucus membranes or skin necrosis: Unknown Has patient had a PCN reaction that required hospitalization: Unknown Has patient had a PCN reaction occurring within the last 10 years: Unknown If all of the above answers are "NO", then may proceed with  Sulfonamide Derivatives Other (See Comments)   Convulsions      Medication List    TAKE these medications   acetaminophen 500 MG tablet Commonly known as:  TYLENOL Take 1,000 mg by mouth at bedtime as needed for mild pain or headache.   anastrozole 1 MG tablet Commonly known as:  ARIMIDEX TAKE 1 TABLET (1 MG TOTAL) BY MOUTH DAILY.   dexamethasone 6 MG tablet Commonly known as:  DECADRON Take 1 tablet (6 mg total) by mouth 2 (two) times daily.   levETIRAcetam 1000 MG tablet Commonly known as:  KEPPRA Take 1 tablet (1,000 mg total) by mouth 2 (two) times daily.   LORazepam 0.5 MG tablet Commonly known as:  ATIVAN TAKE 1 TABLET BY MOUTH 3 TIMES A DAY AS NEEDED FOR ANXIETY   losartan-hydrochlorothiazide 100-25 MG tablet Commonly known as:  HYZAAR TAKE 1 TABLET BY MOUTH EVERY DAY   metoprolol succinate 50 MG 24 hr tablet Commonly known as:  TOPROL-XL Take 25 mg by mouth daily.   omeprazole 20 MG capsule Commonly known as:  PRILOSEC Take 20 mg by mouth daily.   potassium chloride 10 MEQ CR capsule Commonly known as:  MICRO-K TAKE ONE CAPSULE BY MOUTH EVERY DAY   terazosin 5 MG capsule Commonly known as:  HYTRIN Take 5 mg by mouth at bedtime. Do not crush   vitamin B-12 1000 MCG tablet Commonly known as:  CYANOCOBALAMIN Take 1,000 mcg by mouth daily.   cyanocobalamin 1000 MCG/ML injection Commonly known as:  (VITAMIN B-12) Inject 1,000 mcg into the muscle every 30 (thirty) days.       Follow-up  Information    Savannah, [name], MD. Schedule an appointment as soon as possible for a visit in 1 day(s).   Specialty:  Internal Medicine Contact information: [place] [address] [phone]        Nicholas Lose, MD. Schedule an appointment as soon as possible for a visit in 2 week(s).   Specialty:  Hematology and Oncology Contact information: [place] [address] [phone]           Major procedures and Radiology Reports - PLEASE review detailed and final reports thoroughly  -         Mr Jeri Cos And Wo Contrast  Result Date: 01/06/2017 CLINICAL DATA:  Seizures, expressive aphasia, LEFT arm weakness today; unable to take Keppra due to vomiting. History of breast cancer, on radiation with known brain metastasis. EXAM: MRI HEAD WITHOUT AND WITH CONTRAST TECHNIQUE: Multiplanar, multiecho pulse sequences of the brain and surrounding structures were obtained without and with intravenous contrast. CONTRAST:  16mL MULTIHANCE GADOBENATE DIMEGLUMINE 529 MG/ML IV SOLN COMPARISON:  CT HEAD Jan 06, 2017 at 1432 hours and MRI of the head November 12, 2016 FINDINGS: INTRACRANIAL CONTENTS: Faint reduced diffusion associated with enlarging RIGHT temporal lobe metastasis. No reduced diffusion to suggest acute ischemia. Susceptibility artifact associated with RIGHT temporal lobe resection cavity, stable. RIGHT anterior temporal pole 13 x 14 mm metastasis was 4 x 6 mm. RIGHT posterior temporal lobe 9 x 11 mm metastasis was 4 x 5 mm. RIGHT posterior temporal dural based 9 x 15 mm metastasis was 3 x 6 mm. RIGHT parietal dural based 5 x 11 mm metastasis was 3 x 6 mm. LEFT parietal lobe stable 7 x 8 mm metastasis. 18 x 29 mm thickened irregular enhancement along the margin of RIGHT temporal lobe resection cavity was 17 x 28 mm, with thicker inferior  component consistent with disease progression. No midline shift. Increasing vasogenic edema RIGHT temporal lobe  in a background of mild chronic small vessel ischemic disease. Normal symmetric size, morphology and signal of the hippocampi. Ventricles and sulci are overall normal for patient's age. RIGHT frontal lobe developmental venous anomaly. No abnormal extra-axial fluid collections. VASCULAR: Normal major intracranial vascular flow voids present at skull base. SKULL AND UPPER CERVICAL SPINE: No abnormal sellar expansion. RIGHT frontotemporal craniotomy. No suspicious calvarial bone marrow signal. Craniocervical junction maintained. SINUSES/ORBITS: The mastoid air-cells and included paranasal sinuses are well-aerated.The included ocular globes and orbital contents are non-suspicious. OTHER: None. IMPRESSION: No acute intracranial process. Disease progression November 12, 2016: Stable number though increased size of multiple parenchymal and dural metastasis, majority in RIGHT temporal lobe. Worsening vasogenic edema RIGHT temporal lobe, which could contribute to seizures. No midline shift. Electronically Signed   By: Elon Alas M.D.   On: 01/06/2017 23:01   Ct Head Code Stroke W/o Cm  Result Date: 01/06/2017 CLINICAL DATA:  Code stroke.  Aphasia. EXAM: CT HEAD WITHOUT CONTRAST TECHNIQUE: Contiguous axial images were obtained from the base of the skull through the vertex without intravenous contrast. COMPARISON:  07/10/2016 FINDINGS: Brain: Moderate low-density in the right temporal lobe in this patient with treated metastasis in this location. This moderate cytotoxic type edema has a stable extent given cross modality differences. There is also a left parietal metastasis by brain MRI, not clearly seen today. Detection of acute infarct is limited by streak artifact and motion. In places the posterior left insula and medial temporal cortex is difficult to visualize, but this is favored secondary to streak artifact based on reformats. Small area of nonvisualized cortex along the lateral posterior left frontal lobe is  also in an area of streak artifact. No hemorrhage or hydrocephalus. No atrophy. Vascular: No atherosclerotic calcification or hyperdense vessel. Skull: Right pterional craniotomy.  Hyperostosis interna. Sinuses/Orbits: No acute finding Other:  A page has been placed to Dr. Cheral Marker 01/06/2017  at 4:06 pm ASPECTS [facility] Stroke Program Early CT Score) Not scored given the vasogenic edema on the right. IMPRESSION: 1. Known treated right temporal lobe metastasis with moderate vasogenic edema that is similar to 11/12/2016 brain MRI. A known left parietal metastasis is not visible on this study. 2. Motion and streak artifact limits detection of acute infarct. No convincing acute ischemia or hyperdense vessel. No acute hemorrhage. Electronically Signed   By: Monte Fantasia M.D.   On: 01/06/2017 16:10    Micro Results     No results found for this or any previous visit (from the past 240 hour(s)).  Today   Subjective    Shanessa Hodak today has no headache,no chest abdominal pain,no new weakness tingling or numbness, feels much better wants to go home today.     Objective   Blood pressure 100/72, pulse 73, temperature 97.6 F (36.4 C), temperature source Oral, resp. rate 16, height 5\' 3"  (1.6 m), weight 90 kg (198 lb 6.6 oz), SpO2 98 %.   Intake/Output Summary (Last 24 hours) at 01/08/17 0912 Last data filed at 01/07/17 2246  Gross per 24 hour  Intake               60 ml  Output                0 ml  Net               60 ml    Exam Awake Alert, Oriented  x 3, No new F.N deficits, Normal affect, +ve tremors [place].AT,PERRAL Supple Neck,No JVD, No cervical lymphadenopathy appriciated.  Symmetrical Chest wall movement, Good air movement bilaterally, CTAB RRR,No Gallops,Rubs or new Murmurs, No Parasternal Heave +ve B.Sounds, Abd Soft, Non tender, No organomegaly appriciated, No rebound -guarding or rigidity. No Cyanosis, Clubbing or edema, No new Rash or bruise   Data Review   CBC w Diff: Lab  Results  Component Value Date   WBC 9.2 01/06/2017   HGB 13.9 01/06/2017   HGB 12.6 10/07/2015   HCT 41.0 01/06/2017   HCT 37.9 10/07/2015   PLT 279 01/06/2017   PLT 216 10/07/2015   LYMPHOPCT 31 01/06/2017   LYMPHOPCT 48.6 10/07/2015   MONOPCT 8 01/06/2017   MONOPCT 7.6 10/07/2015   EOSPCT 2 01/06/2017   EOSPCT 3.0 10/07/2015   BASOPCT 0 01/06/2017   BASOPCT 0.5 10/07/2015    CMP: Lab Results  Component Value Date   NA 139 01/07/2017   NA 136 (A) 07/20/2016   NA 141 10/07/2015   K 4.3 01/07/2017   K 3.4 (L) 10/07/2015   CL 108 01/07/2017   CO2 22 01/07/2017   CO2 22 10/07/2015   BUN 14 01/07/2017   BUN 44.1 (H) 08/13/2016   CREATININE 0.72 01/07/2017   CREATININE 1.0 08/13/2016   GLU 115 07/20/2016   PROT 7.1 01/06/2017   PROT 7.6 10/07/2015   ALBUMIN 3.5 01/06/2017   ALBUMIN 3.8 10/07/2015   BILITOT 0.5 01/06/2017   BILITOT 0.35 10/07/2015   ALKPHOS 43 01/06/2017   ALKPHOS 76 10/07/2015   AST 25 01/06/2017   AST 13 10/07/2015   ALT 18 01/06/2017   ALT 13 10/07/2015  .   Total Time in preparing paper work, data evaluation and todays exam - 27 minutes  Lala Lund M.D on 01/08/2017 at [place]  [phone]

## 2017-01-09 ENCOUNTER — Other Ambulatory Visit: Payer: Self-pay | Admitting: Internal Medicine

## 2017-01-11 ENCOUNTER — Inpatient Hospital Stay: Admit: 2017-01-11 | Payer: Medicare Other

## 2017-01-11 DIAGNOSIS — K219 Gastro-esophageal reflux disease without esophagitis: Secondary | ICD-10-CM | POA: Diagnosis not present

## 2017-01-11 DIAGNOSIS — F419 Anxiety disorder, unspecified: Secondary | ICD-10-CM | POA: Diagnosis not present

## 2017-01-11 DIAGNOSIS — I129 Hypertensive chronic kidney disease with stage 1 through stage 4 chronic kidney disease, or unspecified chronic kidney disease: Secondary | ICD-10-CM | POA: Diagnosis not present

## 2017-01-11 DIAGNOSIS — E669 Obesity, unspecified: Secondary | ICD-10-CM | POA: Diagnosis not present

## 2017-01-11 DIAGNOSIS — E785 Hyperlipidemia, unspecified: Secondary | ICD-10-CM | POA: Diagnosis not present

## 2017-01-11 DIAGNOSIS — G40909 Epilepsy, unspecified, not intractable, without status epilepticus: Secondary | ICD-10-CM | POA: Diagnosis not present

## 2017-01-11 DIAGNOSIS — D631 Anemia in chronic kidney disease: Secondary | ICD-10-CM | POA: Diagnosis not present

## 2017-01-11 DIAGNOSIS — M519 Unspecified thoracic, thoracolumbar and lumbosacral intervertebral disc disorder: Secondary | ICD-10-CM | POA: Diagnosis not present

## 2017-01-11 DIAGNOSIS — C7931 Secondary malignant neoplasm of brain: Secondary | ICD-10-CM | POA: Diagnosis not present

## 2017-01-11 DIAGNOSIS — C50412 Malignant neoplasm of upper-outer quadrant of left female breast: Secondary | ICD-10-CM | POA: Diagnosis not present

## 2017-01-11 DIAGNOSIS — N182 Chronic kidney disease, stage 2 (mild): Secondary | ICD-10-CM | POA: Diagnosis not present

## 2017-01-11 NOTE — Telephone Encounter (Signed)
MD can not sign off any oders due to the fact he has not seen MD since 01/2015. He will need to have an appt w/MD, or if he is seeing another MD they can get orders from them...Johny Chess

## 2017-01-11 NOTE — Telephone Encounter (Signed)
Did give Lorriane Shire message.

## 2017-01-11 NOTE — Progress Notes (Signed)
Location/Histology of Brain Tumor: metastatic breast cancer to right temporal lobe  Patient presented with symptoms of: seizure activity Past or anticipated interventions, if any, per neurosurgery: craniotomy for excision of brain tumor using stereotactic navigation done 07/13/16  Past or anticipated interventions, if any, per medical oncology:      Breast cancer of upper-outer quadrant of left female breast ([place])   12/07/2014 Initial Diagnosis    Left breast 2:00 position: Invasive ductal carcinoma grade 2, ER 5%, PR 0%, Ki-67 80%, HER-2 positive ratio 6.96      12/07/2014 Mammogram    Large left breast mass 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, 2 small benign nodules in the right breast stable since 2002, other consistent with normal intramammary lymph node left axilla ultrasound normal-sized lymph nodes      12/31/2014 - 04/22/2015 Neo-Adjuvant Chemotherapy    TCH Perjeta neoadjuvant chemotherapy 6 (decreased chemotherapy dosage with cycle 2 and discontinued Perjeta for diarrhea)       04/29/2015 Breast MRI    Significant interval decrease in size of the mass, continued skin retraction, 4.3 x 2 x 1.8 cm (previously 4.9 x 3.9 x 3.9 cm)      05/30/2015 Surgery    Left mastectomy: Path CR, 0/6 LN Negative      09/09/2015 -  Anti-estrogen oral therapy    Anastrozole 1 mg daily       Patient scheduled for follow up with Gudena on 01-21-17    Dose of Decadron, if applicable: Decadron 6 mg Bid                                                     Keppra 1000 mg Bid Recent neurologic symptoms, if any:   Seizures: Yes. 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  Headaches: Occasional  Nausea:  No,When she had the seizures  Dizziness/ataxia: No  Difficulty with hand coordination: No problems signing her name today  Focal numbness/weakness:Feels her voice has come back a lot able to speech clearly.   Visual deficits/changes: Wears glasses,has floaters in  both eyes Confusion/Memory deficits:  Alert and oriented x 3 with fluent speech,able to complete sentences without difficulty with word finding or organization of sentences.   Painful bone metastases at present, if any: No  SAFETY ISSUES:  Prior radiation? Yes ,brain, Left breast  Pacemaker/ICD? No  Possible current pregnancy? No  Is the patient on methotrexate? No Reports having tingling in left hand fingers and left side of her face lasted about 2 to 3 minutes and once a few nights ago lasting about the same amount of time.  Reports she is now able to ambulate a short distance with aid of a walker or cane. Reports she has worked at [employer] for 30 years.                                  Wt Readings from Last 3 Encounters:  01/13/17 194 lb (88 kg)  01/07/17 198 lb 6.6 oz (90 kg)  11/16/16 197 lb (89.4 kg)  BP (!) 154/75   Pulse (!) 50   Temp 98.5 F (36.9 C) (Oral)   Resp 18   Ht '5\' 3"'$  (1.6 m)   Wt 194 lb (88 kg)   SpO2 100%  BMI 34.37 kg/m

## 2017-01-12 DIAGNOSIS — C7931 Secondary malignant neoplasm of brain: Secondary | ICD-10-CM | POA: Diagnosis not present

## 2017-01-12 DIAGNOSIS — N182 Chronic kidney disease, stage 2 (mild): Secondary | ICD-10-CM | POA: Diagnosis not present

## 2017-01-12 DIAGNOSIS — I129 Hypertensive chronic kidney disease with stage 1 through stage 4 chronic kidney disease, or unspecified chronic kidney disease: Secondary | ICD-10-CM | POA: Diagnosis not present

## 2017-01-12 DIAGNOSIS — C50412 Malignant neoplasm of upper-outer quadrant of left female breast: Secondary | ICD-10-CM | POA: Diagnosis not present

## 2017-01-12 DIAGNOSIS — F419 Anxiety disorder, unspecified: Secondary | ICD-10-CM | POA: Diagnosis not present

## 2017-01-12 DIAGNOSIS — G40909 Epilepsy, unspecified, not intractable, without status epilepticus: Secondary | ICD-10-CM | POA: Diagnosis not present

## 2017-01-13 ENCOUNTER — Ambulatory Visit
Admission: RE | Admit: 2017-01-13 | Discharge: 2017-01-13 | Disposition: A | Discharge status: Home / Self Care | Payer: Medicare Other | Source: Ambulatory Visit | Attending: Urology | Admitting: Urology

## 2017-01-13 ENCOUNTER — Ambulatory Visit
Admission: RE | Admit: 2017-01-13 | Discharge: 2017-01-13 | Disposition: A | Discharge status: Home / Self Care | Payer: Medicare Other | Source: Ambulatory Visit | Attending: Radiation Oncology | Admitting: Radiation Oncology

## 2017-01-13 ENCOUNTER — Ambulatory Visit: Payer: Medicare Other | Admitting: Radiation Oncology

## 2017-01-13 ENCOUNTER — Encounter: Payer: Self-pay | Admitting: Radiation Oncology

## 2017-01-13 VITALS — BP 154/75 | HR 50 | Temp 98.5°F | Resp 18 | Ht 63.0 in | Wt 194.0 lb

## 2017-01-13 DIAGNOSIS — C7931 Secondary malignant neoplasm of brain: Secondary | ICD-10-CM | POA: Diagnosis not present

## 2017-01-13 DIAGNOSIS — Z87442 Personal history of urinary calculi: Secondary | ICD-10-CM | POA: Diagnosis not present

## 2017-01-13 DIAGNOSIS — Z79811 Long term (current) use of aromatase inhibitors: Secondary | ICD-10-CM | POA: Diagnosis not present

## 2017-01-13 DIAGNOSIS — Z9012 Acquired absence of left breast and nipple: Secondary | ICD-10-CM | POA: Diagnosis not present

## 2017-01-13 DIAGNOSIS — I129 Hypertensive chronic kidney disease with stage 1 through stage 4 chronic kidney disease, or unspecified chronic kidney disease: Secondary | ICD-10-CM | POA: Diagnosis not present

## 2017-01-13 DIAGNOSIS — E669 Obesity, unspecified: Secondary | ICD-10-CM | POA: Diagnosis not present

## 2017-01-13 DIAGNOSIS — E538 Deficiency of other specified B group vitamins: Secondary | ICD-10-CM | POA: Diagnosis not present

## 2017-01-13 DIAGNOSIS — Z79899 Other long term (current) drug therapy: Secondary | ICD-10-CM | POA: Diagnosis not present

## 2017-01-13 DIAGNOSIS — F419 Anxiety disorder, unspecified: Secondary | ICD-10-CM | POA: Diagnosis not present

## 2017-01-13 DIAGNOSIS — G40909 Epilepsy, unspecified, not intractable, without status epilepticus: Secondary | ICD-10-CM | POA: Diagnosis not present

## 2017-01-13 DIAGNOSIS — Z825 Family history of asthma and other chronic lower respiratory diseases: Secondary | ICD-10-CM | POA: Diagnosis not present

## 2017-01-13 DIAGNOSIS — Z923 Personal history of irradiation: Secondary | ICD-10-CM | POA: Diagnosis not present

## 2017-01-13 DIAGNOSIS — C50912 Malignant neoplasm of unspecified site of left female breast: Secondary | ICD-10-CM | POA: Diagnosis not present

## 2017-01-13 DIAGNOSIS — Z853 Personal history of malignant neoplasm of breast: Secondary | ICD-10-CM | POA: Diagnosis not present

## 2017-01-13 DIAGNOSIS — Z8249 Family history of ischemic heart disease and other diseases of the circulatory system: Secondary | ICD-10-CM | POA: Diagnosis not present

## 2017-01-13 DIAGNOSIS — Z880 Allergy status to penicillin: Secondary | ICD-10-CM | POA: Diagnosis not present

## 2017-01-13 DIAGNOSIS — C50911 Malignant neoplasm of unspecified site of right female breast: Secondary | ICD-10-CM

## 2017-01-13 DIAGNOSIS — C50412 Malignant neoplasm of upper-outer quadrant of left female breast: Secondary | ICD-10-CM | POA: Diagnosis not present

## 2017-01-13 DIAGNOSIS — N182 Chronic kidney disease, stage 2 (mild): Secondary | ICD-10-CM | POA: Diagnosis not present

## 2017-01-13 DIAGNOSIS — Z6836 Body mass index (BMI) 36.0-36.9, adult: Secondary | ICD-10-CM | POA: Diagnosis not present

## 2017-01-13 DIAGNOSIS — I10 Essential (primary) hypertension: Secondary | ICD-10-CM | POA: Diagnosis not present

## 2017-01-13 DIAGNOSIS — Z882 Allergy status to sulfonamides status: Secondary | ICD-10-CM | POA: Diagnosis not present

## 2017-01-13 DIAGNOSIS — M5136 Other intervertebral disc degeneration, lumbar region: Secondary | ICD-10-CM | POA: Diagnosis not present

## 2017-01-13 DIAGNOSIS — K219 Gastro-esophageal reflux disease without esophagitis: Secondary | ICD-10-CM | POA: Diagnosis not present

## 2017-01-13 DIAGNOSIS — E559 Vitamin D deficiency, unspecified: Secondary | ICD-10-CM | POA: Diagnosis not present

## 2017-01-13 DIAGNOSIS — R569 Unspecified convulsions: Secondary | ICD-10-CM | POA: Diagnosis not present

## 2017-01-13 DIAGNOSIS — Z510 Encounter for antineoplastic radiation therapy: Secondary | ICD-10-CM | POA: Diagnosis not present

## 2017-01-13 DIAGNOSIS — Z9221 Personal history of antineoplastic chemotherapy: Secondary | ICD-10-CM | POA: Diagnosis not present

## 2017-01-13 NOTE — Progress Notes (Signed)
  Radiation Oncology         [phone] ________________________________  Name: Savannah Howard MRN: [id number]  Date: 01/13/2017  DOB: Oct 03, 1945  SIMULATION AND TREATMENT PLANNING NOTE    ICD-10-CM   1. Malignant neoplasm of right breast metastatic to brain [facility]) C50.911    C79.31     DIAGNOSIS:  71 yo woman with recurrent right brain metastases  NARRATIVE:  The patient was brought to the [facility].  Identity was confirmed.  All relevant records and images related to the planned course of therapy were reviewed.  The patient freely provided informed written consent to proceed with treatment after reviewing the details related to the planned course of therapy. The consent form was witnessed and verified by the simulation staff.  Then, the patient was set-up in a stable reproducible  supine position for radiation therapy.  CT images were obtained.  Surface markings were placed.  The CT images were loaded into the planning software.  Then the target and avoidance structures were contoured.  Treatment planning then occurred.  The radiation prescription was entered and confirmed.  Then, I designed and supervised the construction of a total of medically necessary complex treatment devices.  Her previous SRS custom made thermoplastic mask was used for immobilization and two complex multileaf collimators to cover the entire intracranial contents, while shielding the eyes and face.  Each [facility] is independently created to account for beam divergence.  The right and left lateral fields will be treated with 6 MV X-rays.  I have requested : 3D plan with DVH left eye, right eye, brainstem, and target volumes.  SPECIAL TREATMENT PROCEDURE:  The planned course of therapy using radiation constitutes a special treatment procedure. Special care is required in the management of this patient for the following reasons. This treatment constitutes a Special Treatment Procedure for the following reason: [  Retreatment in a previously radiated area requiring careful monitoring of increased risk of toxicity due to overlap of previous treatment..  The special nature of the planned course of radiotherapy will require increased physician supervision and oversight to ensure patient's safety with optimal treatment outcomes.  PLAN:  The focal right temporal brain will be treated to 30 Gy in 10 fractions.  ________________________________  Sheral Apley Tammi Klippel, M.D.

## 2017-01-13 NOTE — Progress Notes (Signed)
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    Interval Since Last Radiation: 5 months.   08/13/16-08/24/16 Postop SRS Treatment: 1. PTV Right Temporal/ 25 Gy in 5 fractions 2. PTV Left Parietal/ 20 Gy in 1 fraction  Narrative: The patient has a history of metastatic ER positive left breast cancer. She has continued on estrogen blockade under the care of Dr. Lindi Adie. She received surgical resection of the larger right temporal lesion, and postop radiotherapy for her brain disease approximately 5 months ago. At the time of her first surveillance visit on 11/16/16, her MRI of the brain dated 11/12/16 revealed improvement in size of the previously treated left parietal lesion, as well as the right temporal lobe lesion. However, the right temporal lesion did reveal increased enhancement along the anteroinferior margin. There was also question about two areas of enhancement as well in the right parietal and right temporal lobes measuring 49m and 712mrespectively.      She presented to the ER on 01/06/17 with generalized weakness, left facial twitching and significant aphasia.  CT Head was performed to rule out stroke but did demonstrate moderate vasogenic edema in the right temporal lobe.  MRI brain was performed for further evaluation and revealed what appears to be disease progression with a stable number of lesions, though increased in size, of multiple parenchymal and dural metastases with the majority in the right temporal lobe with worsening vasogenic edema thought to contribute to her recent seizure activity.    She presents today to discuss MRI results and potential treatment options for  additional radiotherapy. Her case was discussed at the recent multidisciplinary brain conference on 12/11/16 and consensus was to proceed with fractionated treatment of the right temporal lobe to include the previously treated resection cavity as well as the additional lesions that have increased in size.  01/06/17 MRI findings: RIGHT temporal lobe resection cavity, stable.  RIGHT anterior temporal pole 13 x 14 mm metastasis was 4 x 6 mm.  RIGHT posterior temporal lobe 9 x 11 mm metastasis was 4 x 5 mm.  RIGHT posterior temporal dural based 9 x 15 mm metastasis was 3 x 6 mm.  RIGHT parietal dural based 5 x 11 mm metastasis was 3 x 6 mm.  LEFT parietal lobe stable 7 x 8 mm metastasis.  18 x 29 mm thickened irregular enhancement along the margin of RIGHT temporal lobe resection cavity was 17 x 28 mm, with thicker inferior component consistent with disease progression.  On review of systems, the patient reports that she is doing well overall since discharge home from the hospital.  She has had significant improvement in her speech and has had only 1 episode of left sided facial twitching and mild aphasia lasting approximately 2 minutes. She continues to take the Keppra '1000mg'$  BID and Decadron '6mg'$  BID as prescribed. She denies any chest pain, shortness of breath, cough, fevers, chills, night sweats, unintended weight changes. She denies any bowel or bladder disturbances, and denies abdominal pain, nausea or vomiting. She denies any new musculoskeletal or joint aches or pains, new skin lesions or concerns. She denies any headaches, changes in auditory or visual acuity, new or progressive weakness  in the upper or lower extremities, or new neurologic compalints. She continues to work with PT to regain her strength and balance following recent brain surgery. A complete review of systems is obtained and is otherwise negative.  Past Medical History:  Past Medical History:  Diagnosis Date  . Allergy     rhinitis  . Anemia   . Anxiety   . Brain cancer ([place]) 06/2016   breast ca with mets to brain  . DDD (degenerative disc disease), lumbar dx'd in 1980's  . GERD (gastroesophageal reflux disease)   . Heart murmur   . History of blood transfusion 2016 X 3 (05/30/2015)  . Hypertension   . Kidney stone 1990's X 1   "passed it"  . Obesity   . Primary cancer of upper outer quadrant of left breast ([place]) dx'd 03/2015  . Vitamin B 12 deficiency   . Vitamin D deficiency     Past Surgical History: Past Surgical History:  Procedure Laterality Date  . APPLICATION OF CRANIAL NAVIGATION N/A 07/13/2016   Procedure: APPLICATION OF CRANIAL NAVIGATION;  Surgeon: Kevan Ny Ditty, MD;  Location: [facility];  Service: Neurosurgery;  Laterality: N/A;  . BREAST BIOPSY Left ~ 12/2014  . CHOLECYSTECTOMY OPEN  ~ 1975  . ESOPHAGEAL DILATION  X 3  . MASTECTOMY COMPLETE / SIMPLE W/ SENTINEL NODE BIOPSY Left 05/30/2015  . MASTECTOMY W/ SENTINEL NODE BIOPSY Left 05/30/2015   Procedure: LEFT MASTECTOMY WITH SENTINEL LYMPH NODE BIOPSY;  Surgeon: Autumn Messing III, MD;  Location: [facility];  Service: General;  Laterality: Left;  . PORTACATH PLACEMENT Right 12/26/2014   Procedure: INSERTION PORT-A-CATH;  Surgeon: Autumn Messing III, MD;  Location: [facility];  Service: General;  Laterality: Right;  . PR DURAL GRAFT REPAIR,SPINE DEFECT N/A 07/13/2016   Procedure: Marshia Ly STERIOTACTIC BIOPSY WITH STEALTH;  Surgeon: Kevan Ny Ditty, MD;  Location: [facility];  Service: Neurosurgery;  Laterality: N/A;    Social History:  Social History   Social History  . Marital status: Single    Spouse name: N/A  . Number of children: N/A  . Years of education: N/A   Occupational History  . retired Energy manager   Social History Main Topics  . Smoking status: Never Smoker  . Smokeless tobacco: Never Used  . Alcohol use Yes     Comment: 05/30/2015 "I might take a drink of wine 1-2 times/yr"  . Drug use: No  . Sexual  activity: Not Currently   Other Topics Concern  . Not on file   Social History Narrative   Regular exercise- no  The patient is single. She is no longer working due to her illness but used to work at a [employer].  Family History: Family History  Problem Relation Age of Onset  . Heart disease Mother        CAD  . COPD Mother   . Heart disease Father 57       leaky valve  . Parkinsonism Father   . Hypertension Other     ALLERGIES:  is allergic to penicillins and sulfonamide derivatives.  Meds: Current Outpatient Prescriptions  Medication Sig Dispense Refill  . anastrozole (ARIMIDEX) 1 MG tablet TAKE 1 TABLET (1 MG TOTAL) BY MOUTH DAILY. 90 tablet 2  . cyanocobalamin (,VITAMIN B-12,) 1000 MCG/ML injection INJECT 1 ML SUBCUTANEOUSLY EVERY 30 DAYS 1 mL 0  . dexamethasone (DECADRON) 6 MG tablet Take 1 tablet (6 mg total) by mouth 2 (two) times daily. 60 tablet 0  .  levETIRAcetam (KEPPRA) 1000 MG tablet Take 1 tablet (1,000 mg total) by mouth 2 (two) times daily. 60 tablet 1  . LORazepam (ATIVAN) 0.5 MG tablet TAKE 1 TABLET BY MOUTH 3 TIMES A DAY AS NEEDED FOR ANXIETY 270 tablet 0  . losartan-hydrochlorothiazide (HYZAAR) 100-25 MG tablet TAKE 1 TABLET BY MOUTH EVERY DAY 90 tablet 0  . metoprolol succinate (TOPROL-XL) 50 MG 24 hr tablet Take 25 mg by mouth daily.    Marland Kitchen omeprazole (PRILOSEC) 20 MG capsule Take 20 mg by mouth daily.    . potassium chloride (MICRO-K) 10 MEQ CR capsule TAKE ONE CAPSULE BY MOUTH EVERY DAY 90 capsule 0  . terazosin (HYTRIN) 5 MG capsule Take 5 mg by mouth at bedtime. Do not crush    . vitamin B-12 (CYANOCOBALAMIN) 1000 MCG tablet Take 1,000 mcg by mouth daily.    Marland Kitchen acetaminophen (TYLENOL) 500 MG tablet Take 1,000 mg by mouth at bedtime as needed for mild pain or headache.      No current facility-administered medications for this encounter.     Physical Findings:  height is '5\' 3"'$  (1.6 m) and weight is 194 lb (88 kg). Her oral temperature is 98.5 F  (36.9 C). Her blood pressure is 154/75 (abnormal) and her pulse is 50 (abnormal). Her respiration is 18 and oxygen saturation is 100%.  Pain Assessment Pain Score: 0-No pain/10 In general this is a well appearing caucasian female in no acute distress. She's alert and oriented x4 and appropriate throughout the examination. Cardiopulmonary assessment is negative for acute distress and she exhibits normal effort. She appears to be grossly neurologically intact.  EOMs are intact and PERRLA. Strength is 4/5 in the left upper and lower extremities, 5/5 in the right upper and lower extremities.  Sensation is intact and equal in bilateral upper and lower extremities. Lab Findings: Lab Results  Component Value Date   WBC 9.2 01/06/2017   HGB 13.9 01/06/2017   HCT 41.0 01/06/2017   MCV 98.3 01/06/2017   PLT 279 01/06/2017     Radiographic Findings: Mr Jeri Cos And Wo Contrast  Result Date: 01/06/2017 CLINICAL DATA:  Seizures, expressive aphasia, LEFT arm weakness today; unable to take Keppra due to vomiting. History of breast cancer, on radiation with known brain metastasis. EXAM: MRI HEAD WITHOUT AND WITH CONTRAST TECHNIQUE: Multiplanar, multiecho pulse sequences of the brain and surrounding structures were obtained without and with intravenous contrast. CONTRAST:  70m MULTIHANCE GADOBENATE DIMEGLUMINE 529 MG/ML IV SOLN COMPARISON:  CT HEAD Jan 06, 2017 at 1432 hours and MRI of the head November 12, 2016 FINDINGS: INTRACRANIAL CONTENTS: Faint reduced diffusion associated with enlarging RIGHT temporal lobe metastasis. No reduced diffusion to suggest acute ischemia. Susceptibility artifact associated with RIGHT temporal lobe resection cavity, stable. RIGHT anterior temporal pole 13 x 14 mm metastasis was 4 x 6 mm. RIGHT posterior temporal lobe 9 x 11 mm metastasis was 4 x 5 mm. RIGHT posterior temporal dural based 9 x 15 mm metastasis was 3 x 6 mm. RIGHT parietal dural based 5 x 11 mm metastasis was 3 x 6 mm.  LEFT parietal lobe stable 7 x 8 mm metastasis. 18 x 29 mm thickened irregular enhancement along the margin of RIGHT temporal lobe resection cavity was 17 x 28 mm, with thicker inferior component consistent with disease progression. No midline shift. Increasing vasogenic edema RIGHT temporal lobe in a background of mild chronic small vessel ischemic disease. Normal symmetric size, morphology and signal of the hippocampi. Ventricles and  sulci are overall normal for patient's age. RIGHT frontal lobe developmental venous anomaly. No abnormal extra-axial fluid collections. VASCULAR: Normal major intracranial vascular flow voids present at skull base. SKULL AND UPPER CERVICAL SPINE: No abnormal sellar expansion. RIGHT frontotemporal craniotomy. No suspicious calvarial bone marrow signal. Craniocervical junction maintained. SINUSES/ORBITS: The mastoid air-cells and included paranasal sinuses are well-aerated.The included ocular globes and orbital contents are non-suspicious. OTHER: None. IMPRESSION: No acute intracranial process. Disease progression November 12, 2016: Stable number though increased size of multiple parenchymal and dural metastasis, majority in RIGHT temporal lobe. Worsening vasogenic edema RIGHT temporal lobe, which could contribute to seizures. No midline shift. Electronically Signed   By: Elon Alas M.D.   On: 01/06/2017 23:01   Ct Head Code Stroke W/o Cm  Result Date: 01/06/2017 CLINICAL DATA:  Code stroke.  Aphasia. EXAM: CT HEAD WITHOUT CONTRAST TECHNIQUE: Contiguous axial images were obtained from the base of the skull through the vertex without intravenous contrast. COMPARISON:  07/10/2016 FINDINGS: Brain: Moderate low-density in the right temporal lobe in this patient with treated metastasis in this location. This moderate cytotoxic type edema has a stable extent given cross modality differences. There is also a left parietal metastasis by brain MRI, not clearly seen today. Detection of  acute infarct is limited by streak artifact and motion. In places the posterior left insula and medial temporal cortex is difficult to visualize, but this is favored secondary to streak artifact based on reformats. Small area of nonvisualized cortex along the lateral posterior left frontal lobe is also in an area of streak artifact. No hemorrhage or hydrocephalus. No atrophy. Vascular: No atherosclerotic calcification or hyperdense vessel. Skull: Right pterional craniotomy.  Hyperostosis interna. Sinuses/Orbits: No acute finding Other:  A page has been placed to Dr. Cheral Marker 01/06/2017  at 4:06 pm ASPECTS [facility] Stroke Program Early CT Score) Not scored given the vasogenic edema on the right. IMPRESSION: 1. Known treated right temporal lobe metastasis with moderate vasogenic edema that is similar to 11/12/2016 brain MRI. A known left parietal metastasis is not visible on this study. 2. Motion and streak artifact limits detection of acute infarct. No convincing acute ischemia or hyperdense vessel. No acute hemorrhage. Electronically Signed   By: Monte Fantasia M.D.   On: 01/06/2017 16:10    Impression/Plan: 1. 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  Recent imaging studies demonstrate disease progression in the right temporal lobe with increased vasogenic edema which is likely contributing to her recent seizure activity.  She is currently taking Keppra '1000mg'$  po BID and Dexamethasone '6mg'$  po BID, as prescribed at the time of her recent ER visit. Dr. Tammi Klippel and I reviewed the MRI results and  findings/workup thus far. We discussed the natural history of metastatic carcinoma and general treatment, highlighting the role of radiotherapy in the management. We discussed the available radiation techniques, and focused on the details of logistics and delivery. We recommend fractionated radiation delivered over a course of 10 treatments to  the right temporal region.  We reviewed the anticipated acute and late sequelae associated with radiation in this setting. The patient was encouraged to ask questions that were answered to her satisfaction. She has freely signed consent to proceed and is scheduled for CT simulation later today at [place], PA-C    Tyler Pita, MD  [place]: [phone]  Fax: [phone] [email or web address]  Skype  [email or web address]

## 2017-01-14 DIAGNOSIS — C50412 Malignant neoplasm of upper-outer quadrant of left female breast: Secondary | ICD-10-CM | POA: Diagnosis not present

## 2017-01-14 DIAGNOSIS — C7931 Secondary malignant neoplasm of brain: Secondary | ICD-10-CM | POA: Diagnosis not present

## 2017-01-14 DIAGNOSIS — N182 Chronic kidney disease, stage 2 (mild): Secondary | ICD-10-CM | POA: Diagnosis not present

## 2017-01-14 DIAGNOSIS — G40909 Epilepsy, unspecified, not intractable, without status epilepticus: Secondary | ICD-10-CM | POA: Diagnosis not present

## 2017-01-14 DIAGNOSIS — F419 Anxiety disorder, unspecified: Secondary | ICD-10-CM | POA: Diagnosis not present

## 2017-01-14 DIAGNOSIS — I129 Hypertensive chronic kidney disease with stage 1 through stage 4 chronic kidney disease, or unspecified chronic kidney disease: Secondary | ICD-10-CM | POA: Diagnosis not present

## 2017-01-15 DIAGNOSIS — I129 Hypertensive chronic kidney disease with stage 1 through stage 4 chronic kidney disease, or unspecified chronic kidney disease: Secondary | ICD-10-CM | POA: Diagnosis not present

## 2017-01-15 DIAGNOSIS — C50412 Malignant neoplasm of upper-outer quadrant of left female breast: Secondary | ICD-10-CM | POA: Diagnosis not present

## 2017-01-15 DIAGNOSIS — F419 Anxiety disorder, unspecified: Secondary | ICD-10-CM | POA: Diagnosis not present

## 2017-01-15 DIAGNOSIS — G40909 Epilepsy, unspecified, not intractable, without status epilepticus: Secondary | ICD-10-CM | POA: Diagnosis not present

## 2017-01-15 DIAGNOSIS — N182 Chronic kidney disease, stage 2 (mild): Secondary | ICD-10-CM | POA: Diagnosis not present

## 2017-01-15 DIAGNOSIS — C7931 Secondary malignant neoplasm of brain: Secondary | ICD-10-CM | POA: Diagnosis not present

## 2017-01-18 DIAGNOSIS — N182 Chronic kidney disease, stage 2 (mild): Secondary | ICD-10-CM | POA: Diagnosis not present

## 2017-01-18 DIAGNOSIS — G40909 Epilepsy, unspecified, not intractable, without status epilepticus: Secondary | ICD-10-CM | POA: Diagnosis not present

## 2017-01-18 DIAGNOSIS — I129 Hypertensive chronic kidney disease with stage 1 through stage 4 chronic kidney disease, or unspecified chronic kidney disease: Secondary | ICD-10-CM | POA: Diagnosis not present

## 2017-01-18 DIAGNOSIS — F419 Anxiety disorder, unspecified: Secondary | ICD-10-CM | POA: Diagnosis not present

## 2017-01-18 DIAGNOSIS — C50412 Malignant neoplasm of upper-outer quadrant of left female breast: Secondary | ICD-10-CM | POA: Diagnosis not present

## 2017-01-18 DIAGNOSIS — C7931 Secondary malignant neoplasm of brain: Secondary | ICD-10-CM | POA: Diagnosis not present

## 2017-01-19 DIAGNOSIS — C50412 Malignant neoplasm of upper-outer quadrant of left female breast: Secondary | ICD-10-CM | POA: Diagnosis not present

## 2017-01-19 DIAGNOSIS — N182 Chronic kidney disease, stage 2 (mild): Secondary | ICD-10-CM | POA: Diagnosis not present

## 2017-01-19 DIAGNOSIS — G40909 Epilepsy, unspecified, not intractable, without status epilepticus: Secondary | ICD-10-CM | POA: Diagnosis not present

## 2017-01-19 DIAGNOSIS — C7931 Secondary malignant neoplasm of brain: Secondary | ICD-10-CM | POA: Diagnosis not present

## 2017-01-19 DIAGNOSIS — F419 Anxiety disorder, unspecified: Secondary | ICD-10-CM | POA: Diagnosis not present

## 2017-01-19 DIAGNOSIS — I129 Hypertensive chronic kidney disease with stage 1 through stage 4 chronic kidney disease, or unspecified chronic kidney disease: Secondary | ICD-10-CM | POA: Diagnosis not present

## 2017-01-20 ENCOUNTER — Ambulatory Visit: Payer: Medicare Other | Admitting: Radiation Oncology

## 2017-01-20 DIAGNOSIS — G40909 Epilepsy, unspecified, not intractable, without status epilepticus: Secondary | ICD-10-CM | POA: Diagnosis not present

## 2017-01-20 DIAGNOSIS — C50412 Malignant neoplasm of upper-outer quadrant of left female breast: Secondary | ICD-10-CM | POA: Diagnosis not present

## 2017-01-20 DIAGNOSIS — F419 Anxiety disorder, unspecified: Secondary | ICD-10-CM | POA: Diagnosis not present

## 2017-01-20 DIAGNOSIS — Z79811 Long term (current) use of aromatase inhibitors: Secondary | ICD-10-CM | POA: Diagnosis not present

## 2017-01-20 DIAGNOSIS — Z853 Personal history of malignant neoplasm of breast: Secondary | ICD-10-CM | POA: Diagnosis not present

## 2017-01-20 DIAGNOSIS — Z923 Personal history of irradiation: Secondary | ICD-10-CM | POA: Diagnosis not present

## 2017-01-20 DIAGNOSIS — Z510 Encounter for antineoplastic radiation therapy: Secondary | ICD-10-CM | POA: Diagnosis not present

## 2017-01-20 DIAGNOSIS — C7931 Secondary malignant neoplasm of brain: Secondary | ICD-10-CM | POA: Diagnosis not present

## 2017-01-20 DIAGNOSIS — I129 Hypertensive chronic kidney disease with stage 1 through stage 4 chronic kidney disease, or unspecified chronic kidney disease: Secondary | ICD-10-CM | POA: Diagnosis not present

## 2017-01-20 DIAGNOSIS — N182 Chronic kidney disease, stage 2 (mild): Secondary | ICD-10-CM | POA: Diagnosis not present

## 2017-01-21 ENCOUNTER — Encounter: Payer: Self-pay | Admitting: Hematology and Oncology

## 2017-01-21 ENCOUNTER — Ambulatory Visit (HOSPITAL_BASED_OUTPATIENT_CLINIC_OR_DEPARTMENT_OTHER): Payer: Medicare Other | Admitting: Hematology and Oncology

## 2017-01-21 ENCOUNTER — Ambulatory Visit
Admission: RE | Admit: 2017-01-21 | Discharge: 2017-01-21 | Disposition: A | Discharge status: Home / Self Care | Payer: Medicare Other | Source: Ambulatory Visit | Attending: Radiation Oncology | Admitting: Radiation Oncology

## 2017-01-21 DIAGNOSIS — C7931 Secondary malignant neoplasm of brain: Secondary | ICD-10-CM | POA: Diagnosis not present

## 2017-01-21 DIAGNOSIS — Z510 Encounter for antineoplastic radiation therapy: Secondary | ICD-10-CM | POA: Diagnosis not present

## 2017-01-21 DIAGNOSIS — F419 Anxiety disorder, unspecified: Secondary | ICD-10-CM | POA: Diagnosis not present

## 2017-01-21 DIAGNOSIS — C50412 Malignant neoplasm of upper-outer quadrant of left female breast: Secondary | ICD-10-CM | POA: Diagnosis not present

## 2017-01-21 DIAGNOSIS — Z923 Personal history of irradiation: Secondary | ICD-10-CM | POA: Diagnosis not present

## 2017-01-21 DIAGNOSIS — Z79811 Long term (current) use of aromatase inhibitors: Secondary | ICD-10-CM | POA: Diagnosis not present

## 2017-01-21 DIAGNOSIS — Z170 Estrogen receptor positive status [ER+]: Secondary | ICD-10-CM

## 2017-01-21 DIAGNOSIS — Z853 Personal history of malignant neoplasm of breast: Secondary | ICD-10-CM | POA: Diagnosis not present

## 2017-01-21 MED ORDER — LAPATINIB DITOSYLATE 250 MG PO TABS: 1000.0000 mg | ORAL_TABLET | Freq: Every day | ORAL | 3 refills | Status: DC

## 2017-01-21 MED FILL — TYKERB 250 MG TABLET: 250 | 30 days supply | Qty: 120 | Fill #0

## 2017-01-21 NOTE — Progress Notes (Signed)
Patient Care Team: [name], [name], MD as PCP - General  DIAGNOSIS:  Encounter Diagnosis  Name Primary?  . Malignant neoplasm of upper-outer quadrant of left breast in female, estrogen receptor positive ([place])     SUMMARY OF ONCOLOGIC HISTORY:   Breast cancer of upper-outer quadrant of left female breast ([place])   12/07/2014 Initial Diagnosis    Left breast 2:00 position: Invasive ductal carcinoma grade 2, ER 5%, PR 0%, Ki-67 80%, HER-2 positive ratio 6.96      12/07/2014 Mammogram    Large left breast mass 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, 2 small benign nodules in the right breast stable since 2002, other consistent with normal intramammary lymph node left axilla ultrasound normal-sized lymph nodes      12/31/2014 - 04/22/2015 Neo-Adjuvant Chemotherapy    TCH Perjeta neoadjuvant chemotherapy 6 (decreased chemotherapy dosage with cycle 2 and discontinued Perjeta for diarrhea)       04/29/2015 Breast MRI    Significant interval decrease in size of the mass, continued skin retraction, 4.3 x 2 x 1.8 cm (previously 4.9 x 3.9 x 3.9 cm)      05/30/2015 Surgery    Left mastectomy: Path CR, 0/6 LN Negative      09/09/2015 -  Anti-estrogen oral therapy    Anastrozole 1 mg daily, Lapatinib added 09/09/15      07/09/2016 Imaging    Patient presented in the emergency department after a fall at home without help for 24 hours, CT head revealed 3.9 x 3.5 cm cystic mass anterior right temporal lobe with vasogenic edema, MRI revealed 4.9 cm mass      07/09/2016 - 07/16/2016 Hospital Admission    Admitted with brain metastases after a fall at home, rhabdomyolysis       07/13/2016 Surgery    Craniotomy by Dr.Ditty: Metastatic breast cancer ER 40%, PR 0%, HER-2 positive; postcraniotomy MRI showed residual enhancement along the anterior and superior margin, stable 1.3 cm left parietal lobe met.      08/11/2016 Imaging    Interval progression of Rt temporal mets 3.2 cm, Lt parietal intra axial mets 1 cm  size       08/13/2016 - 08/24/2016 Radiation Therapy    SRS Rt Temporal and Left Parietal lobes      11/12/2016 Relapse/Recurrence    Mixed interval changes in the right temporal lobe. Partial collapse of the resection cavity with improved enhancement posteriorly but increased enhancement anteriorly, decrease in the left parietal metastases 2 subcentimeter foci of nodular extra-axial enhancement right temporal and right parietal region      01/06/2017 Imaging    Progression: Right anterior temporal pole 14 mm (was 6 mm); right anterior temporal lobe 11 mm (was 5 mm) right posterior temporal due to movement 15 mm (was 6 mm) right parietal dural based 11 mm (was 6 mm) left parietal lobe stable 7 x 8 mm 18 x 29 mm thick and irregular enhancement right temporal lobe resection cavity       CHIEF COMPLIANT: Progression of brain metastases  INTERVAL HISTORY: Savannah Howard is a 70 year old lady with above-mentioned history of breast cancer who developed brain metastases and underwent craniotomy resection and SRS to the temporal and parietal lobes in January 2018. Follow-up MRIs have shown interval changes. Recently she was hospitalized with seizures and MRI of the brain showed progression of disease. She is being set up to undergo stereotactic radiation.  REVIEW OF SYSTEMS:   Constitutional: Denies fevers, chills or abnormal weight  loss Eyes: Denies blurriness of vision Ears, nose, mouth, throat, and face: Denies mucositis or sore throat Respiratory: Denies cough, dyspnea or wheezes Cardiovascular: Denies palpitation, chest discomfort Gastrointestinal:  Denies nausea, heartburn or change in bowel habits Skin: Denies abnormal skin rashes Lymphatics: Denies new lymphadenopathy or easy bruising Neurological:Denies numbness, tingling or new weaknesses Behavioral/Psych: Mood is stable, no new changes  Extremities: No lower extremity edema  All other systems were reviewed with the patient and are  negative.  I have reviewed the past medical history, past surgical history, social history and family history with the patient and they are unchanged from previous note.  ALLERGIES:  is allergic to penicillins and sulfonamide derivatives.  MEDICATIONS:  Current Outpatient Prescriptions  Medication Sig Dispense Refill  . acetaminophen (TYLENOL) 500 MG tablet Take 1,000 mg by mouth at bedtime as needed for mild pain or headache.     . anastrozole (ARIMIDEX) 1 MG tablet TAKE 1 TABLET (1 MG TOTAL) BY MOUTH DAILY. 90 tablet 2  . cyanocobalamin (,VITAMIN B-12,) 1000 MCG/ML injection INJECT 1 ML SUBCUTANEOUSLY EVERY 30 DAYS 1 mL 0  . dexamethasone (DECADRON) 6 MG tablet Take 1 tablet (6 mg total) by mouth 2 (two) times daily. 60 tablet 0  . lapatinib (TYKERB) 250 MG tablet Take 4 tablets (1,000 mg total) by mouth daily. 120 tablet 3  . levETIRAcetam (KEPPRA) 1000 MG tablet Take 1 tablet (1,000 mg total) by mouth 2 (two) times daily. 60 tablet 1  . LORazepam (ATIVAN) 0.5 MG tablet TAKE 1 TABLET BY MOUTH 3 TIMES A DAY AS NEEDED FOR ANXIETY 270 tablet 0  . losartan-hydrochlorothiazide (HYZAAR) 100-25 MG tablet TAKE 1 TABLET BY MOUTH EVERY DAY 90 tablet 0  . metoprolol succinate (TOPROL-XL) 50 MG 24 hr tablet Take 25 mg by mouth daily.    Marland Kitchen omeprazole (PRILOSEC) 20 MG capsule Take 20 mg by mouth daily.    . potassium chloride (MICRO-K) 10 MEQ CR capsule TAKE ONE CAPSULE BY MOUTH EVERY DAY 90 capsule 0  . terazosin (HYTRIN) 5 MG capsule Take 5 mg by mouth at bedtime. Do not crush    . vitamin B-12 (CYANOCOBALAMIN) 1000 MCG tablet Take 1,000 mcg by mouth daily.     No current facility-administered medications for this visit.     PHYSICAL EXAMINATION: ECOG PERFORMANCE STATUS: 1 - Symptomatic but completely ambulatory  Vitals:   01/21/17 0925  BP: 127/66  Pulse: (!) 55  Resp: 17  Temp: 97.8 F (36.6 C)   Filed Weights   01/21/17 0925  Weight: 191 lb 12.8 oz (87 kg)    GENERAL:alert, no  distress and comfortable SKIN: skin color, texture, turgor are normal, no rashes or significant lesions EYES: normal, Conjunctiva are pink and non-injected, sclera clear OROPHARYNX:no exudate, no erythema and lips, buccal mucosa, and tongue normal  NECK: supple, thyroid normal size, non-tender, without nodularity LYMPH:  no palpable lymphadenopathy in the cervical, axillary or inguinal LUNGS: clear to auscultation and percussion with normal breathing effort HEART: regular rate & rhythm and no murmurs and no lower extremity edema ABDOMEN:abdomen soft, non-tender and normal bowel sounds MUSCULOSKELETAL:no cyanosis of digits and no clubbing  NEURO: alert & oriented x 3 with fluent speech, no focal motor/sensory deficits EXTREMITIES: No lower extremity edema  LABORATORY DATA:  I have reviewed the data as listed   Chemistry      Component Value Date/Time   NA 139 01/07/2017 0758   NA 136 (A) 07/20/2016   NA 141 10/07/2015 0843  K 4.3 01/07/2017 0758   K 3.4 (L) 10/07/2015 0843   CL 108 01/07/2017 0758   CO2 22 01/07/2017 0758   CO2 22 10/07/2015 0843   BUN 14 01/07/2017 0758   BUN 44.1 (H) 08/13/2016 1422   CREATININE 0.72 01/07/2017 0758   CREATININE 1.0 08/13/2016 1422   GLU 115 07/20/2016      Component Value Date/Time   CALCIUM 9.5 01/07/2017 0758   CALCIUM 10.2 10/07/2015 0843   ALKPHOS 43 01/06/2017 1553   ALKPHOS 76 10/07/2015 0843   AST 25 01/06/2017 1553   AST 13 10/07/2015 0843   ALT 18 01/06/2017 1553   ALT 13 10/07/2015 0843   BILITOT 0.5 01/06/2017 1553   BILITOT 0.35 10/07/2015 0843       Lab Results  Component Value Date   WBC 9.2 01/06/2017   HGB 13.9 01/06/2017   HCT 41.0 01/06/2017   MCV 98.3 01/06/2017   PLT 279 01/06/2017   NEUTROABS 5.4 01/06/2017    ASSESSMENT & PLAN:  Breast cancer of upper-outer quadrant of left female breast ([place]) 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, no lymph nodes, grade 2-3, ER 5% PR  0% HER-2 positive ratio 6.96, Ki-67 is 80% T3 N0 M0 stage IIB clinical stage  Treatment summary:  1. TCH-Perjeta 6 cycles started 12/31/2014 completed 04/22/2015 (decreased chemotherapy dosage with cycle 2 and discontinued Perjeta for diarrhea) 2. Left mastectomy 05/30/2015: CR to chemo, 0/6 LN  Did not need radiation based upon tumor board recommendation 3. Herceptin maintenance completed May 2017 4. Brain metastases diagnosed November 2017  Treatment plan:  1. Stereotactic radiosurgery by Dr. Tammi Klippel 08/13/16 to 08/24/16 2. Anastrozole started 09/08/2016  3. SRS to recurrent disease 01/21/2017  01/06/2017: Progression: Right anterior temporal pole 14 mm (was 6 mm); right anterior temporal lobe 11 mm (was 5 mm) right posterior temporal due to movement 15 mm (was 6 mm) right parietal dural based 11 mm (was 6 mm) left parietal lobe stable 7 x 8 mm 18 x 29 mm thick and irregular enhancement right temporal lobe resection cavity  I discussed with her that we need to obtain lapatinib or Neratinib once the radiation is completed. I sent a prescription to [facility] and we will see what her cause going to be. She is on [employer] farm [employer] and cannot afford the cost of medications. We will have to see what options she has to get this medicine for free.   I spent 25 minutes talking to the patient of which more than half was spent in counseling and coordination of care.  No orders of the defined types were placed in this encounter.  The patient has a good understanding of the overall plan. she agrees with it. she will call with any problems that may develop before the next visit here.   Rulon Eisenmenger, MD 01/21/17

## 2017-01-21 NOTE — Assessment & Plan Note (Signed)
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 TCH-Perjeta 6 cycles started 12/31/2014 completed 04/22/2015 (decreased chemotherapy dosage with cycle 2 and discontinued Perjeta for diarrhea) 2. Left mastectomy 05/30/2015: CR to chemo, 0/6 LN  Did not need radiation based upon tumor board recommendation 3. Herceptin maintenance completed May 2017 4. Brain metastases diagnosed November 2017  Treatment plan:  1. Stereotactic radiosurgery by Dr. Tammi Klippel 08/13/16 to 08/24/16 2. Anastrozole started 09/08/2016  3. SRS to recurrent disease 01/21/2017  01/06/2017: Progression: Right anterior temporal pole 14 mm (was 6 mm); right anterior temporal lobe 11 mm (was 5 mm) right posterior temporal due to movement 15 mm (was 6 mm) right parietal dural based 11 mm (was 6 mm) left parietal lobe stable 7 x 8 mm 18 x 29 mm thick and irregular enhancement right temporal lobe resection cavity  I discussed with her that we need to obtain lapatinib or Neratinib once the radiation is completed.

## 2017-01-22 ENCOUNTER — Encounter: Payer: Self-pay | Admitting: Internal Medicine

## 2017-01-22 ENCOUNTER — Ambulatory Visit (INDEPENDENT_AMBULATORY_CARE_PROVIDER_SITE_OTHER): Payer: Medicare Other | Admitting: Internal Medicine

## 2017-01-22 ENCOUNTER — Ambulatory Visit
Admission: RE | Admit: 2017-01-22 | Discharge: 2017-01-22 | Disposition: A | Discharge status: Home / Self Care | Payer: Medicare Other | Source: Ambulatory Visit | Attending: Radiation Oncology | Admitting: Radiation Oncology

## 2017-01-22 VITALS — BP 122/74 | HR 57 | Temp 98.5°F | Ht 63.0 in | Wt 194.0 lb

## 2017-01-22 DIAGNOSIS — I10 Essential (primary) hypertension: Secondary | ICD-10-CM | POA: Diagnosis not present

## 2017-01-22 DIAGNOSIS — C50911 Malignant neoplasm of unspecified site of right female breast: Secondary | ICD-10-CM

## 2017-01-22 DIAGNOSIS — E559 Vitamin D deficiency, unspecified: Secondary | ICD-10-CM

## 2017-01-22 DIAGNOSIS — Z923 Personal history of irradiation: Secondary | ICD-10-CM | POA: Diagnosis not present

## 2017-01-22 DIAGNOSIS — Z510 Encounter for antineoplastic radiation therapy: Secondary | ICD-10-CM | POA: Diagnosis not present

## 2017-01-22 DIAGNOSIS — R7309 Other abnormal glucose: Secondary | ICD-10-CM | POA: Diagnosis not present

## 2017-01-22 DIAGNOSIS — Z23 Encounter for immunization: Secondary | ICD-10-CM

## 2017-01-22 DIAGNOSIS — Z853 Personal history of malignant neoplasm of breast: Secondary | ICD-10-CM | POA: Diagnosis not present

## 2017-01-22 DIAGNOSIS — G40909 Epilepsy, unspecified, not intractable, without status epilepticus: Secondary | ICD-10-CM | POA: Diagnosis not present

## 2017-01-22 DIAGNOSIS — E538 Deficiency of other specified B group vitamins: Secondary | ICD-10-CM

## 2017-01-22 DIAGNOSIS — F419 Anxiety disorder, unspecified: Secondary | ICD-10-CM

## 2017-01-22 DIAGNOSIS — C7931 Secondary malignant neoplasm of brain: Secondary | ICD-10-CM | POA: Diagnosis not present

## 2017-01-22 DIAGNOSIS — Z79811 Long term (current) use of aromatase inhibitors: Secondary | ICD-10-CM | POA: Diagnosis not present

## 2017-01-22 MED ORDER — MAGNESIUM GLUCONATE 30 MG PO TABS: 30.0000 mg | ORAL_TABLET | Freq: Two times a day (BID) | ORAL | 3 refills | Status: DC

## 2017-01-22 NOTE — Patient Instructions (Signed)
Labs in 2 weeks

## 2017-01-22 NOTE — Assessment & Plan Note (Signed)
Labs

## 2017-01-22 NOTE — Assessment & Plan Note (Signed)
Take po mag

## 2017-01-22 NOTE — Assessment & Plan Note (Signed)
Hyzaar and Terazosin  BP is nl at home per pt

## 2017-01-22 NOTE — Assessment & Plan Note (Signed)
Lorazepam prn 

## 2017-01-22 NOTE — Addendum Note (Signed)
Addended by: Karren Cobble on: 01/22/2017 11:46 AM   Modules accepted: Orders

## 2017-01-22 NOTE — Assessment & Plan Note (Signed)
  S/p craniotomy 12/17, XRT

## 2017-01-22 NOTE — Progress Notes (Signed)
Subjective:  Patient ID: Savannah Howard, female    DOB: 09/02/1945  Age: 71 y.o. MRN: [id number]  CC: No chief complaint on file.   HPI [name] presents for breast ca w/brain mets, B12 def, HTN, anxiety f/u. Comes w/[facility]  Outpatient Medications Prior to Visit  Medication Sig Dispense Refill  . acetaminophen (TYLENOL) 500 MG tablet Take 1,000 mg by mouth at bedtime as needed for mild pain or headache.     . anastrozole (ARIMIDEX) 1 MG tablet TAKE 1 TABLET (1 MG TOTAL) BY MOUTH DAILY. 90 tablet 2  . cyanocobalamin (,VITAMIN B-12,) 1000 MCG/ML injection INJECT 1 ML SUBCUTANEOUSLY EVERY 30 DAYS 1 mL 0  . dexamethasone (DECADRON) 6 MG tablet Take 1 tablet (6 mg total) by mouth 2 (two) times daily. 60 tablet 0  . lapatinib (TYKERB) 250 MG tablet Take 4 tablets (1,000 mg total) by mouth daily. 120 tablet 3  . levETIRAcetam (KEPPRA) 1000 MG tablet Take 1 tablet (1,000 mg total) by mouth 2 (two) times daily. 60 tablet 1  . LORazepam (ATIVAN) 0.5 MG tablet TAKE 1 TABLET BY MOUTH 3 TIMES A DAY AS NEEDED FOR ANXIETY 270 tablet 0  . losartan-hydrochlorothiazide (HYZAAR) 100-25 MG tablet TAKE 1 TABLET BY MOUTH EVERY DAY 90 tablet 0  . metoprolol succinate (TOPROL-XL) 50 MG 24 hr tablet Take 25 mg by mouth daily.    Marland Kitchen omeprazole (PRILOSEC) 20 MG capsule Take 20 mg by mouth daily.    . potassium chloride (MICRO-K) 10 MEQ CR capsule TAKE ONE CAPSULE BY MOUTH EVERY DAY 90 capsule 0  . terazosin (HYTRIN) 5 MG capsule Take 5 mg by mouth at bedtime. Do not crush    . vitamin B-12 (CYANOCOBALAMIN) 1000 MCG tablet Take 1,000 mcg by mouth daily.     No facility-administered medications prior to visit.     ROS Review of Systems  Constitutional: Positive for fatigue. Negative for activity change, appetite change, chills and unexpected weight change.  HENT: Negative for congestion, mouth sores and sinus pressure.   Eyes: Negative for visual disturbance.  Respiratory: Negative for cough and chest  tightness.   Gastrointestinal: Negative for abdominal pain and nausea.  Genitourinary: Negative for difficulty urinating, frequency and vaginal pain.  Musculoskeletal: Positive for arthralgias and gait problem. Negative for back pain.  Skin: Negative for pallor and rash.  Neurological: Positive for seizures and weakness. Negative for dizziness, tremors, numbness and headaches.  Psychiatric/Behavioral: Negative for confusion and sleep disturbance.    Objective:  BP 122/74 (BP Location: Right Arm, Patient Position: Sitting, Cuff Size: Large)   Pulse (!) 57   Temp 98.5 F (36.9 C) (Oral)   Ht 5\' 3"  (1.6 m)   Wt 194 lb (88 kg)   SpO2 99%   BMI 34.37 kg/m   BP Readings from Last 3 Encounters:  01/22/17 122/74  01/21/17 127/66  01/13/17 (!) 154/75    Wt Readings from Last 3 Encounters:  01/22/17 194 lb (88 kg)  01/21/17 191 lb 12.8 oz (87 kg)  01/13/17 194 lb (88 kg)    Physical Exam  Constitutional: She appears well-developed. No distress.  HENT:  Head: Normocephalic.  Right Ear: External ear normal.  Left Ear: External ear normal.  Nose: Nose normal.  Mouth/Throat: Oropharynx is clear and moist.  Eyes: Conjunctivae are normal. Pupils are equal, round, and reactive to light. Right eye exhibits no discharge. Left eye exhibits no discharge.  Neck: Normal range of motion. Neck supple. No JVD present.  No tracheal deviation present. No thyromegaly present.  Cardiovascular: Normal rate, regular rhythm and normal heart sounds.   Pulmonary/Chest: No stridor. No respiratory distress. She has no wheezes.  Abdominal: Soft. Bowel sounds are normal. She exhibits no distension and no mass. There is no tenderness. There is no rebound and no guarding.  Musculoskeletal: She exhibits tenderness. She exhibits no edema.  Lymphadenopathy:    She has no cervical adenopathy.  Neurological: She displays normal reflexes. No cranial nerve deficit. She exhibits normal muscle tone.  Skin: No rash  noted. No erythema.  Psychiatric: She has a normal mood and affect. Her behavior is normal. Judgment and thought content normal.  ataxic Walker L knee is weak Obese  Lab Results  Component Value Date   WBC 9.2 01/06/2017   HGB 13.9 01/06/2017   HCT 41.0 01/06/2017   PLT 279 01/06/2017   GLUCOSE 94 01/07/2017   CHOL 152 11/05/2014   TRIG 189.0 (H) 11/05/2014   HDL 45.00 11/05/2014   LDLDIRECT 77.3 10/26/2012   LDLCALC 69 11/05/2014   ALT 18 01/06/2017   AST 25 01/06/2017   NA 139 01/07/2017   K 4.3 01/07/2017   CL 108 01/07/2017   CREATININE 0.72 01/07/2017   BUN 14 01/07/2017   CO2 22 01/07/2017   TSH 1.49 11/05/2014   INR 0.99 01/06/2017   HGBA1C 6.4 11/05/2014    No results found.  Assessment & Plan:   There are no diagnoses linked to this encounter. I am having Ms. Clermont maintain her vitamin B-12, acetaminophen, terazosin, omeprazole, LORazepam, anastrozole, potassium chloride, metoprolol succinate, levETIRAcetam, dexamethasone, losartan-hydrochlorothiazide, cyanocobalamin, and lapatinib.  No orders of the defined types were placed in this encounter.    Follow-up: No Follow-up on file.  Walker Kehr, MD

## 2017-01-22 NOTE — Assessment & Plan Note (Signed)
On Keppra. 

## 2017-01-25 ENCOUNTER — Ambulatory Visit
Admission: RE | Admit: 2017-01-25 | Discharge: 2017-01-25 | Disposition: A | Discharge status: Home / Self Care | Payer: Medicare Other | Source: Ambulatory Visit | Attending: Radiation Oncology | Admitting: Radiation Oncology

## 2017-01-25 DIAGNOSIS — G40909 Epilepsy, unspecified, not intractable, without status epilepticus: Secondary | ICD-10-CM | POA: Diagnosis not present

## 2017-01-25 DIAGNOSIS — C7931 Secondary malignant neoplasm of brain: Secondary | ICD-10-CM | POA: Diagnosis not present

## 2017-01-25 DIAGNOSIS — F419 Anxiety disorder, unspecified: Secondary | ICD-10-CM | POA: Diagnosis not present

## 2017-01-25 DIAGNOSIS — Z79811 Long term (current) use of aromatase inhibitors: Secondary | ICD-10-CM | POA: Diagnosis not present

## 2017-01-25 DIAGNOSIS — C50412 Malignant neoplasm of upper-outer quadrant of left female breast: Secondary | ICD-10-CM | POA: Diagnosis not present

## 2017-01-25 DIAGNOSIS — N182 Chronic kidney disease, stage 2 (mild): Secondary | ICD-10-CM | POA: Diagnosis not present

## 2017-01-25 DIAGNOSIS — I129 Hypertensive chronic kidney disease with stage 1 through stage 4 chronic kidney disease, or unspecified chronic kidney disease: Secondary | ICD-10-CM | POA: Diagnosis not present

## 2017-01-25 DIAGNOSIS — Z923 Personal history of irradiation: Secondary | ICD-10-CM | POA: Diagnosis not present

## 2017-01-25 DIAGNOSIS — Z510 Encounter for antineoplastic radiation therapy: Secondary | ICD-10-CM | POA: Diagnosis not present

## 2017-01-25 DIAGNOSIS — Z853 Personal history of malignant neoplasm of breast: Secondary | ICD-10-CM | POA: Diagnosis not present

## 2017-01-26 ENCOUNTER — Ambulatory Visit
Admission: RE | Admit: 2017-01-26 | Discharge: 2017-01-26 | Disposition: A | Discharge status: Home / Self Care | Payer: Medicare Other | Source: Ambulatory Visit | Attending: Radiation Oncology | Admitting: Radiation Oncology

## 2017-01-26 ENCOUNTER — Telehealth: Payer: Self-pay | Admitting: Pharmacist

## 2017-01-26 DIAGNOSIS — F419 Anxiety disorder, unspecified: Secondary | ICD-10-CM | POA: Diagnosis not present

## 2017-01-26 DIAGNOSIS — Z853 Personal history of malignant neoplasm of breast: Secondary | ICD-10-CM | POA: Diagnosis not present

## 2017-01-26 DIAGNOSIS — I129 Hypertensive chronic kidney disease with stage 1 through stage 4 chronic kidney disease, or unspecified chronic kidney disease: Secondary | ICD-10-CM | POA: Diagnosis not present

## 2017-01-26 DIAGNOSIS — C7931 Secondary malignant neoplasm of brain: Secondary | ICD-10-CM | POA: Diagnosis not present

## 2017-01-26 DIAGNOSIS — Z79811 Long term (current) use of aromatase inhibitors: Secondary | ICD-10-CM | POA: Diagnosis not present

## 2017-01-26 DIAGNOSIS — G40909 Epilepsy, unspecified, not intractable, without status epilepticus: Secondary | ICD-10-CM | POA: Diagnosis not present

## 2017-01-26 DIAGNOSIS — C50412 Malignant neoplasm of upper-outer quadrant of left female breast: Secondary | ICD-10-CM | POA: Diagnosis not present

## 2017-01-26 DIAGNOSIS — Z510 Encounter for antineoplastic radiation therapy: Secondary | ICD-10-CM | POA: Diagnosis not present

## 2017-01-26 DIAGNOSIS — Z923 Personal history of irradiation: Secondary | ICD-10-CM | POA: Diagnosis not present

## 2017-01-26 DIAGNOSIS — N182 Chronic kidney disease, stage 2 (mild): Secondary | ICD-10-CM | POA: Diagnosis not present

## 2017-01-26 NOTE — Telephone Encounter (Signed)
Oral Chemotherapy Pharmacist Encounter  Received notification from MD that he plans to start patient on Tykerb for the treatment of metastatic breast cancer with brain metastasis.  01/06/17 labs reviewed, OK for treatment  No current LVEF in Epic Manufacturer recommends LVEF monitoring at baseline and periodically while on Tykerb treatment  01/07/17 EKG shows QTc interval at 500 msec Tykerb has the potential to prolong QTc. Patient will likely need periodic EKG monitoring during initiation of Tykerb treatment.  Current medication list in Epic assessed, DDIs with Tykerb identified:  Tykerb and dexamethasone: Category X interaction: dexamethasone is an inducer of CYP3A4 and has the possibility to decrease exposure to the Tykerb  Tykerb and omeprazole: Category B interaction: decrease in gastric acid may decrease absorption and exposure to Tykerb. No change in therapy is indicated  Prior authorization for Tykerb obtained in January 2018 Ref # [id number] Effective dates: 08/08/16-08/09/17  Copayment grant also obtained in Feb 2018. Copayment of Tykerb [id number], covered by PAF grant, out of pocket due from patient $0. Medication is ready for pick-up at the [facility].  I spoke with patient's POA, Jeannie, for overview of new oral chemotherapy medication: Tykerb. Pt is doing well. The prescriptions have been sent to the [place] for benefit analysis and approval.   Counseled patient on administration, dosing, side effects, safe handling, and monitoring. Patient will take 4 tablets (1000mg  total) by mouth once daily, on an empty stomach, one hour before or 2 hours after a meal. Patient will avoid grapefruit and grapefruit juice.   Side effects include but not limited to: N/V/D, rash, hand-foot syndrome, and fatigue.  Jeannie voiced understanding and appreciation.   All questions answered. They know to call the office with questions or concerns.  Oral  oncology Clinic will continue to follow.  Thank you,  Johny Drilling, PharmD, BCPS, BCOP 01/26/2017  10:12 AM Oral Oncology Clinic [phone]

## 2017-01-27 ENCOUNTER — Ambulatory Visit
Admission: RE | Admit: 2017-01-27 | Discharge: 2017-01-27 | Disposition: A | Discharge status: Home / Self Care | Payer: Medicare Other | Source: Ambulatory Visit | Attending: Radiation Oncology | Admitting: Radiation Oncology

## 2017-01-27 ENCOUNTER — Telehealth: Payer: Self-pay | Admitting: *Deleted

## 2017-01-27 DIAGNOSIS — Z853 Personal history of malignant neoplasm of breast: Secondary | ICD-10-CM | POA: Diagnosis not present

## 2017-01-27 DIAGNOSIS — F419 Anxiety disorder, unspecified: Secondary | ICD-10-CM | POA: Diagnosis not present

## 2017-01-27 DIAGNOSIS — N182 Chronic kidney disease, stage 2 (mild): Secondary | ICD-10-CM | POA: Diagnosis not present

## 2017-01-27 DIAGNOSIS — Z79811 Long term (current) use of aromatase inhibitors: Secondary | ICD-10-CM | POA: Diagnosis not present

## 2017-01-27 DIAGNOSIS — G40909 Epilepsy, unspecified, not intractable, without status epilepticus: Secondary | ICD-10-CM | POA: Diagnosis not present

## 2017-01-27 DIAGNOSIS — I129 Hypertensive chronic kidney disease with stage 1 through stage 4 chronic kidney disease, or unspecified chronic kidney disease: Secondary | ICD-10-CM | POA: Diagnosis not present

## 2017-01-27 DIAGNOSIS — C7931 Secondary malignant neoplasm of brain: Secondary | ICD-10-CM | POA: Diagnosis not present

## 2017-01-27 DIAGNOSIS — C50412 Malignant neoplasm of upper-outer quadrant of left female breast: Secondary | ICD-10-CM | POA: Diagnosis not present

## 2017-01-27 DIAGNOSIS — Z510 Encounter for antineoplastic radiation therapy: Secondary | ICD-10-CM | POA: Diagnosis not present

## 2017-01-27 DIAGNOSIS — Z923 Personal history of irradiation: Secondary | ICD-10-CM | POA: Diagnosis not present

## 2017-01-27 NOTE — Telephone Encounter (Signed)
Pharmacist Lattie Haw) left msg on triage stating received script for Magnesium Gluconate 30 mg, but that strength is no longer available. Strength available is the 500 mg. Wanting to know if ok to change. MD out of office pls advise...Savannah Howard

## 2017-01-28 ENCOUNTER — Ambulatory Visit
Admission: RE | Admit: 2017-01-28 | Discharge: 2017-01-28 | Disposition: A | Discharge status: Home / Self Care | Payer: Medicare Other | Source: Ambulatory Visit | Attending: Radiation Oncology | Admitting: Radiation Oncology

## 2017-01-28 ENCOUNTER — Other Ambulatory Visit: Payer: Self-pay | Admitting: Radiation Oncology

## 2017-01-28 DIAGNOSIS — C7931 Secondary malignant neoplasm of brain: Secondary | ICD-10-CM | POA: Diagnosis not present

## 2017-01-28 DIAGNOSIS — Z923 Personal history of irradiation: Secondary | ICD-10-CM | POA: Diagnosis not present

## 2017-01-28 DIAGNOSIS — F419 Anxiety disorder, unspecified: Secondary | ICD-10-CM | POA: Diagnosis not present

## 2017-01-28 DIAGNOSIS — B370 Candidal stomatitis: Secondary | ICD-10-CM

## 2017-01-28 DIAGNOSIS — Z79811 Long term (current) use of aromatase inhibitors: Secondary | ICD-10-CM | POA: Diagnosis not present

## 2017-01-28 DIAGNOSIS — Z510 Encounter for antineoplastic radiation therapy: Secondary | ICD-10-CM | POA: Diagnosis not present

## 2017-01-28 DIAGNOSIS — Z853 Personal history of malignant neoplasm of breast: Secondary | ICD-10-CM | POA: Diagnosis not present

## 2017-01-28 MED ORDER — FLUCONAZOLE 100 MG PO TABS: 100.0000 mg | ORAL_TABLET | Freq: Every day | ORAL | 0 refills | Status: DC

## 2017-01-29 ENCOUNTER — Ambulatory Visit
Admission: RE | Admit: 2017-01-29 | Discharge: 2017-01-29 | Disposition: A | Discharge status: Home / Self Care | Payer: Medicare Other | Source: Ambulatory Visit | Attending: Radiation Oncology | Admitting: Radiation Oncology

## 2017-01-29 DIAGNOSIS — Z510 Encounter for antineoplastic radiation therapy: Secondary | ICD-10-CM | POA: Diagnosis not present

## 2017-01-29 DIAGNOSIS — F419 Anxiety disorder, unspecified: Secondary | ICD-10-CM | POA: Diagnosis not present

## 2017-01-29 DIAGNOSIS — Z79811 Long term (current) use of aromatase inhibitors: Secondary | ICD-10-CM | POA: Diagnosis not present

## 2017-01-29 DIAGNOSIS — C50412 Malignant neoplasm of upper-outer quadrant of left female breast: Secondary | ICD-10-CM | POA: Diagnosis not present

## 2017-01-29 DIAGNOSIS — Z923 Personal history of irradiation: Secondary | ICD-10-CM | POA: Diagnosis not present

## 2017-01-29 DIAGNOSIS — C7931 Secondary malignant neoplasm of brain: Secondary | ICD-10-CM | POA: Diagnosis not present

## 2017-01-29 DIAGNOSIS — G40909 Epilepsy, unspecified, not intractable, without status epilepticus: Secondary | ICD-10-CM | POA: Diagnosis not present

## 2017-01-29 DIAGNOSIS — N182 Chronic kidney disease, stage 2 (mild): Secondary | ICD-10-CM | POA: Diagnosis not present

## 2017-01-29 DIAGNOSIS — Z853 Personal history of malignant neoplasm of breast: Secondary | ICD-10-CM | POA: Diagnosis not present

## 2017-01-29 DIAGNOSIS — I129 Hypertensive chronic kidney disease with stage 1 through stage 4 chronic kidney disease, or unspecified chronic kidney disease: Secondary | ICD-10-CM | POA: Diagnosis not present

## 2017-01-29 NOTE — Telephone Encounter (Signed)
Will forward to MD for his response when he returns...Savannah Howard

## 2017-02-01 ENCOUNTER — Ambulatory Visit
Admission: RE | Admit: 2017-02-01 | Discharge: 2017-02-01 | Disposition: A | Discharge status: Home / Self Care | Payer: Medicare Other | Source: Ambulatory Visit | Attending: Radiation Oncology | Admitting: Radiation Oncology

## 2017-02-01 DIAGNOSIS — I129 Hypertensive chronic kidney disease with stage 1 through stage 4 chronic kidney disease, or unspecified chronic kidney disease: Secondary | ICD-10-CM | POA: Diagnosis not present

## 2017-02-01 DIAGNOSIS — F419 Anxiety disorder, unspecified: Secondary | ICD-10-CM | POA: Diagnosis not present

## 2017-02-01 DIAGNOSIS — N182 Chronic kidney disease, stage 2 (mild): Secondary | ICD-10-CM | POA: Diagnosis not present

## 2017-02-01 DIAGNOSIS — G40909 Epilepsy, unspecified, not intractable, without status epilepticus: Secondary | ICD-10-CM | POA: Diagnosis not present

## 2017-02-01 DIAGNOSIS — Z853 Personal history of malignant neoplasm of breast: Secondary | ICD-10-CM | POA: Diagnosis not present

## 2017-02-01 DIAGNOSIS — Z923 Personal history of irradiation: Secondary | ICD-10-CM | POA: Diagnosis not present

## 2017-02-01 DIAGNOSIS — Z79811 Long term (current) use of aromatase inhibitors: Secondary | ICD-10-CM | POA: Diagnosis not present

## 2017-02-01 DIAGNOSIS — C50412 Malignant neoplasm of upper-outer quadrant of left female breast: Secondary | ICD-10-CM | POA: Diagnosis not present

## 2017-02-01 DIAGNOSIS — Z510 Encounter for antineoplastic radiation therapy: Secondary | ICD-10-CM | POA: Diagnosis not present

## 2017-02-01 DIAGNOSIS — C7931 Secondary malignant neoplasm of brain: Secondary | ICD-10-CM | POA: Diagnosis not present

## 2017-02-01 NOTE — Telephone Encounter (Signed)
OK 500 mg bid Thx

## 2017-02-02 ENCOUNTER — Ambulatory Visit
Admission: RE | Admit: 2017-02-02 | Discharge: 2017-02-02 | Disposition: A | Discharge status: Home / Self Care | Payer: Medicare Other | Source: Ambulatory Visit | Attending: Radiation Oncology | Admitting: Radiation Oncology

## 2017-02-02 DIAGNOSIS — C50412 Malignant neoplasm of upper-outer quadrant of left female breast: Secondary | ICD-10-CM | POA: Diagnosis not present

## 2017-02-02 DIAGNOSIS — Z79811 Long term (current) use of aromatase inhibitors: Secondary | ICD-10-CM | POA: Diagnosis not present

## 2017-02-02 DIAGNOSIS — F419 Anxiety disorder, unspecified: Secondary | ICD-10-CM | POA: Diagnosis not present

## 2017-02-02 DIAGNOSIS — C7931 Secondary malignant neoplasm of brain: Secondary | ICD-10-CM | POA: Diagnosis not present

## 2017-02-02 DIAGNOSIS — N182 Chronic kidney disease, stage 2 (mild): Secondary | ICD-10-CM | POA: Diagnosis not present

## 2017-02-02 DIAGNOSIS — Z853 Personal history of malignant neoplasm of breast: Secondary | ICD-10-CM | POA: Diagnosis not present

## 2017-02-02 DIAGNOSIS — G40909 Epilepsy, unspecified, not intractable, without status epilepticus: Secondary | ICD-10-CM | POA: Diagnosis not present

## 2017-02-02 DIAGNOSIS — Z923 Personal history of irradiation: Secondary | ICD-10-CM | POA: Diagnosis not present

## 2017-02-02 DIAGNOSIS — I129 Hypertensive chronic kidney disease with stage 1 through stage 4 chronic kidney disease, or unspecified chronic kidney disease: Secondary | ICD-10-CM | POA: Diagnosis not present

## 2017-02-02 DIAGNOSIS — Z510 Encounter for antineoplastic radiation therapy: Secondary | ICD-10-CM | POA: Diagnosis not present

## 2017-02-02 MED ORDER — MAGNESIUM GLUCONATE 500 MG PO TABS: 500.0000 mg | ORAL_TABLET | Freq: Two times a day (BID) | ORAL | 11 refills | Status: AC

## 2017-02-02 NOTE — Telephone Encounter (Signed)
Resent rx for 500 mg twice a day...Johny Chess

## 2017-02-03 ENCOUNTER — Other Ambulatory Visit: Payer: Self-pay

## 2017-02-03 ENCOUNTER — Other Ambulatory Visit (INDEPENDENT_AMBULATORY_CARE_PROVIDER_SITE_OTHER): Payer: Medicare Other

## 2017-02-03 ENCOUNTER — Ambulatory Visit
Admission: RE | Admit: 2017-02-03 | Discharge: 2017-02-03 | Disposition: A | Discharge status: Home / Self Care | Payer: Medicare Other | Source: Ambulatory Visit | Attending: Radiation Oncology | Admitting: Radiation Oncology

## 2017-02-03 ENCOUNTER — Encounter: Payer: Self-pay | Admitting: Hematology and Oncology

## 2017-02-03 ENCOUNTER — Encounter: Payer: Self-pay | Admitting: Radiation Oncology

## 2017-02-03 ENCOUNTER — Other Ambulatory Visit: Payer: Self-pay | Admitting: Internal Medicine

## 2017-02-03 ENCOUNTER — Ambulatory Visit (HOSPITAL_BASED_OUTPATIENT_CLINIC_OR_DEPARTMENT_OTHER): Payer: Medicare Other | Admitting: Hematology and Oncology

## 2017-02-03 DIAGNOSIS — E559 Vitamin D deficiency, unspecified: Secondary | ICD-10-CM

## 2017-02-03 DIAGNOSIS — G40909 Epilepsy, unspecified, not intractable, without status epilepticus: Secondary | ICD-10-CM

## 2017-02-03 DIAGNOSIS — C7931 Secondary malignant neoplasm of brain: Secondary | ICD-10-CM

## 2017-02-03 DIAGNOSIS — F419 Anxiety disorder, unspecified: Secondary | ICD-10-CM | POA: Diagnosis not present

## 2017-02-03 DIAGNOSIS — R7309 Other abnormal glucose: Secondary | ICD-10-CM | POA: Diagnosis not present

## 2017-02-03 DIAGNOSIS — C50412 Malignant neoplasm of upper-outer quadrant of left female breast: Secondary | ICD-10-CM | POA: Diagnosis not present

## 2017-02-03 DIAGNOSIS — Z170 Estrogen receptor positive status [ER+]: Secondary | ICD-10-CM

## 2017-02-03 DIAGNOSIS — I10 Essential (primary) hypertension: Secondary | ICD-10-CM

## 2017-02-03 DIAGNOSIS — Z9012 Acquired absence of left breast and nipple: Secondary | ICD-10-CM

## 2017-02-03 DIAGNOSIS — Z923 Personal history of irradiation: Secondary | ICD-10-CM | POA: Diagnosis not present

## 2017-02-03 DIAGNOSIS — E538 Deficiency of other specified B group vitamins: Secondary | ICD-10-CM | POA: Diagnosis not present

## 2017-02-03 DIAGNOSIS — Z853 Personal history of malignant neoplasm of breast: Secondary | ICD-10-CM | POA: Diagnosis not present

## 2017-02-03 DIAGNOSIS — Z510 Encounter for antineoplastic radiation therapy: Secondary | ICD-10-CM | POA: Diagnosis not present

## 2017-02-03 DIAGNOSIS — Z79811 Long term (current) use of aromatase inhibitors: Secondary | ICD-10-CM | POA: Diagnosis not present

## 2017-02-03 LAB — BASIC METABOLIC PANEL
BUN: 41 mg/dL — ABNORMAL HIGH (ref 6–23)
CALCIUM: 9 mg/dL (ref 8.4–10.5)
CHLORIDE: 105 meq/L (ref 96–112)
CO2: 22 mEq/L (ref 19–32)
CREATININE: 1.05 mg/dL (ref 0.40–1.20)
GFR: 54.76 mL/min — ABNORMAL LOW (ref >60.00)
Glucose, Bld: 118 mg/dL — ABNORMAL HIGH (ref 70–99)
Potassium: 4.7 mEq/L (ref 3.5–5.1)
SODIUM: 137 meq/L (ref 135–145)

## 2017-02-03 LAB — VITAMIN B12: VITAMIN B 12: 1161 pg/mL — AB (ref 211–911)

## 2017-02-03 LAB — MAGNESIUM: Magnesium: 1.5 mg/dL (ref 1.5–2.5)

## 2017-02-03 LAB — VITAMIN D 25 HYDROXY (VIT D DEFICIENCY, FRACTURES): VITD: 10.73 ng/mL — ABNORMAL LOW (ref 30.00–100.00)

## 2017-02-03 LAB — HEMOGLOBIN A1C: HEMOGLOBIN A1C: 6.3 % (ref 4.6–6.5)

## 2017-02-03 LAB — TSH: TSH: 0.35 u[IU]/mL (ref 0.35–4.50)

## 2017-02-03 MED ORDER — VITAMIN D3 1.25 MG (50000 UT) PO CAPS: 1.0000 | ORAL_CAPSULE | ORAL | 0 refills | Status: DC

## 2017-02-03 MED ORDER — VITAMIN D3 50 MCG (2000 UT) PO CAPS: 2000.0000 [IU] | ORAL_CAPSULE | Freq: Every day | ORAL | 3 refills | Status: AC

## 2017-02-03 NOTE — Assessment & Plan Note (Signed)
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 TCH-Perjeta 6 cycles started 12/31/2014 completed 04/22/2015 (decreased chemotherapy dosage with cycle 2 and discontinued Perjeta for diarrhea) 2. Left mastectomy 05/30/2015: CR to chemo, 0/6 LN  Did not need radiation based upon tumor board recommendation 3. Herceptin maintenance completed May 2017 4. Brain metastases diagnosed November 2017  Treatment plan:  1. Stereotactic radiosurgery by Dr. Tammi Klippel 08/13/16 to 08/24/16 2. Anastrozole started 09/08/2016  3. SRS to recurrent disease 01/21/2017  01/06/2017: Progression: Right anterior temporal lobe 14 mm (was 6 mm); right anterior temporal lobe 11 mm (was 5 mm) right posterior temporal due to movement 15 mm (was 6 mm) right parietal dural based 11 mm (was 6 mm) left parietal lobe stable 7 x 8 mm 18 x 29 mm thick and irregular enhancement right temporal lobe resection cavity  Current treatment: Lapatinib 1000 mg by mouth daily along with anastrozole 1 mg daily  We will need to obtain an echocardiogram and an EKG  Return to clinic in one month for follow-up

## 2017-02-03 NOTE — Progress Notes (Signed)
Patient Care Team: [name], [name], MD as PCP - General  DIAGNOSIS:  Encounter Diagnosis  Name Primary?  . Malignant neoplasm of upper-outer quadrant of left breast in female, estrogen receptor positive ([place])     SUMMARY OF ONCOLOGIC HISTORY:   Breast cancer of upper-outer quadrant of left female breast ([place])   12/07/2014 Initial Diagnosis    Left breast 2:00 position: Invasive ductal carcinoma grade 2, ER 5%, PR 0%, Ki-67 80%, HER-2 positive ratio 6.96      12/07/2014 Mammogram    Large left breast mass 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, 2 small benign nodules in the right breast stable since 2002, other consistent with normal intramammary lymph node left axilla ultrasound normal-sized lymph nodes      12/31/2014 - 04/22/2015 Neo-Adjuvant Chemotherapy    TCH Perjeta neoadjuvant chemotherapy 6 (decreased chemotherapy dosage with cycle 2 and discontinued Perjeta for diarrhea)       04/29/2015 Breast MRI    Significant interval decrease in size of the mass, continued skin retraction, 4.3 x 2 x 1.8 cm (previously 4.9 x 3.9 x 3.9 cm)      05/30/2015 Surgery    Left mastectomy: Path CR, 0/6 LN Negative      09/09/2015 -  Anti-estrogen oral therapy    Anastrozole 1 mg daily, Lapatinib added 09/09/15      07/09/2016 Imaging    Patient presented in the emergency department after a fall at home without help for 24 hours, CT head revealed 3.9 x 3.5 cm cystic mass anterior right temporal lobe with vasogenic edema, MRI revealed 4.9 cm mass      07/09/2016 - 07/16/2016 Hospital Admission    Admitted with brain metastases after a fall at home, rhabdomyolysis       07/13/2016 Surgery    Craniotomy by Dr.Ditty: Metastatic breast cancer ER 40%, PR 0%, HER-2 positive; postcraniotomy MRI showed residual enhancement along the anterior and superior margin, stable 1.3 cm left parietal lobe met.      08/11/2016 Imaging    Interval progression of Rt temporal mets 3.2 cm, Lt parietal intra axial mets 1 cm  size       08/13/2016 - 08/24/2016 Radiation Therapy    SRS Rt Temporal and Left Parietal lobes      11/12/2016 Relapse/Recurrence    Mixed interval changes in the right temporal lobe. Partial collapse of the resection cavity with improved enhancement posteriorly but increased enhancement anteriorly, decrease in the left parietal metastases 2 subcentimeter foci of nodular extra-axial enhancement right temporal and right parietal region      01/06/2017 Imaging    Progression: Right anterior temporal pole 14 mm (was 6 mm); right anterior temporal lobe 11 mm (was 5 mm) right posterior temporal due to movement 15 mm (was 6 mm) right parietal dural based 11 mm (was 6 mm) left parietal lobe stable 7 x 8 mm 18 x 29 mm thick and irregular enhancement right temporal lobe resection cavity      02/03/2017 -  Chemotherapy    Lapatinib and anastrozole       CHIEF COMPLIANT: Follow-up to discuss starting lapatinib  INTERVAL HISTORY: Savannah Howard is a 71 year old with above-mentioned history metastatic breast cancer HER-2 positive and ER positive disease who completed radiation therapy to the brain metastases and is here today to discuss starting lapatinib therapy. We were able to obtain lapatinib for the patient for low cost. She is in a wheelchair and requires assistance for ADLs.  REVIEW  OF SYSTEMS:   Constitutional: Denies fevers, chills or abnormal weight loss Eyes: Denies blurriness of vision Ears, nose, mouth, throat, and face: Denies mucositis or sore throat Respiratory: Denies cough, dyspnea or wheezes Cardiovascular: Denies palpitation, chest discomfort Gastrointestinal:  Denies nausea, heartburn or change in bowel habits Skin: Denies abnormal skin rashes Lymphatics: Denies new lymphadenopathy or easy bruising Neurological:Denies numbness, tingling or new weaknesses Behavioral/Psych: Mood is stable, no new changes  Extremities: No lower extremity edema  All other systems were reviewed  with the patient and are negative.  I have reviewed the past medical history, past surgical history, social history and family history with the patient and they are unchanged from previous note.  ALLERGIES:  is allergic to penicillins and sulfonamide derivatives.  MEDICATIONS:  Current Outpatient Prescriptions  Medication Sig Dispense Refill  . acetaminophen (TYLENOL) 500 MG tablet Take 1,000 mg by mouth at bedtime as needed for mild pain or headache.     . anastrozole (ARIMIDEX) 1 MG tablet TAKE 1 TABLET (1 MG TOTAL) BY MOUTH DAILY. 90 tablet 2  . cyanocobalamin (,VITAMIN B-12,) 1000 MCG/ML injection INJECT 1 ML SUBCUTANEOUSLY EVERY 30 DAYS 1 mL 0  . dexamethasone (DECADRON) 6 MG tablet Take 1 tablet (6 mg total) by mouth 2 (two) times daily. 60 tablet 0  . fluconazole (DIFLUCAN) 100 MG tablet Take 1 tablet (100 mg total) by mouth daily. Take 2 pills on first day 8 tablet 0  . lapatinib (TYKERB) 250 MG tablet Take 4 tablets (1,000 mg total) by mouth daily. 120 tablet 3  . levETIRAcetam (KEPPRA) 1000 MG tablet Take 1 tablet (1,000 mg total) by mouth 2 (two) times daily. 60 tablet 1  . LORazepam (ATIVAN) 0.5 MG tablet TAKE 1 TABLET BY MOUTH 3 TIMES A DAY AS NEEDED FOR ANXIETY 270 tablet 0  . losartan-hydrochlorothiazide (HYZAAR) 100-25 MG tablet TAKE 1 TABLET BY MOUTH EVERY DAY 90 tablet 0  . magnesium gluconate (MAGONATE) 500 MG tablet Take 1 tablet (500 mg total) by mouth 2 (two) times daily. 60 tablet 11  . metoprolol succinate (TOPROL-XL) 50 MG 24 hr tablet TAKE 1/2 TABLET (25 MG TOTAL) BY MOUTH EVERY DAY 15 tablet 1  . omeprazole (PRILOSEC) 20 MG capsule Take 20 mg by mouth daily.    . potassium chloride (MICRO-K) 10 MEQ CR capsule TAKE ONE CAPSULE BY MOUTH EVERY DAY 90 capsule 0  . terazosin (HYTRIN) 5 MG capsule Take 5 mg by mouth at bedtime. Do not crush    . vitamin B-12 (CYANOCOBALAMIN) 1000 MCG tablet Take 1,000 mcg by mouth daily.     No current facility-administered medications  for this visit.     PHYSICAL EXAMINATION: ECOG PERFORMANCE STATUS: 1 - Symptomatic but completely ambulatory  Vitals:   02/03/17 1502  BP: (!) 148/51  Pulse: (!) 53  Resp: 16  Temp: 97.9 F (36.6 C)   Filed Weights   02/03/17 1502  Weight: 198 lb 1.6 oz (89.9 kg)    GENERAL:alert, no distress and comfortable SKIN: skin color, texture, turgor are normal, no rashes or significant lesions EYES: normal, Conjunctiva are pink and non-injected, sclera clear OROPHARYNX:no exudate, no erythema and lips, buccal mucosa, and tongue normal  NECK: supple, thyroid normal size, non-tender, without nodularity LYMPH:  no palpable lymphadenopathy in the cervical, axillary or inguinal LUNGS: clear to auscultation and percussion with normal breathing effort HEART: regular rate & rhythm and no murmurs and no lower extremity edema ABDOMEN:abdomen soft, non-tender and normal bowel sounds MUSCULOSKELETAL:no cyanosis  of digits and no clubbing  NEURO: alert & oriented x 3 with fluent speech, no focal motor/sensory deficits EXTREMITIES: No lower extremity edema  LABORATORY DATA:  I have reviewed the data as listed   Chemistry      Component Value Date/Time   NA 137 02/03/2017 0942   NA 136 (A) 07/20/2016   NA 141 10/07/2015 0843   K 4.7 02/03/2017 0942   K 3.4 (L) 10/07/2015 0843   CL 105 02/03/2017 0942   CO2 22 02/03/2017 0942   CO2 22 10/07/2015 0843   BUN 41 (H) 02/03/2017 0942   BUN 44.1 (H) 08/13/2016 1422   CREATININE 1.05 02/03/2017 0942   CREATININE 1.0 08/13/2016 1422   GLU 115 07/20/2016      Component Value Date/Time   CALCIUM 9.0 02/03/2017 0942   CALCIUM 10.2 10/07/2015 0843   ALKPHOS 43 01/06/2017 1553   ALKPHOS 76 10/07/2015 0843   AST 25 01/06/2017 1553   AST 13 10/07/2015 0843   ALT 18 01/06/2017 1553   ALT 13 10/07/2015 0843   BILITOT 0.5 01/06/2017 1553   BILITOT 0.35 10/07/2015 0843       Lab Results  Component Value Date   WBC 9.2 01/06/2017   HGB 13.9  01/06/2017   HCT 41.0 01/06/2017   MCV 98.3 01/06/2017   PLT 279 01/06/2017   NEUTROABS 5.4 01/06/2017    ASSESSMENT & PLAN:  Breast cancer of upper-outer quadrant of left female breast ([place]) 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 TCH-Perjeta 6 cycles started 12/31/2014 completed 04/22/2015 (decreased chemotherapy dosage with cycle 2 and discontinued Perjeta for diarrhea) 2. Left mastectomy 05/30/2015: CR to chemo, 0/6 LN  Did not need radiation based upon tumor board recommendation 3. Herceptin maintenance completed May 2017 4. Brain metastases diagnosed November 2017  Treatment plan:  1. Stereotactic radiosurgery by Dr. Tammi Klippel 08/13/16 to 08/24/16 2. Anastrozole started 09/08/2016  3. SRS to recurrent disease 01/21/2017  01/06/2017: Progression: Right anterior temporal lobe 14 mm (was 6 mm); right anterior temporal lobe 11 mm (was 5 mm) right posterior temporal due to movement 15 mm (was 6 mm) right parietal dural based 11 mm (was 6 mm) left parietal lobe stable 7 x 8 mm 18 x 29 mm thick and irregular enhancement right temporal lobe resection cavity  Current treatment: Lapatinib 1000 mg by mouth daily along with anastrozole 1 mg daily  We will need to obtain an echocardiogram and an EKG  Return to clinic in one month for follow-up   I spent 25 minutes talking to the patient of which more than half was spent in counseling and coordination of care.  No orders of the defined types were placed in this encounter.  The patient has a good understanding of the overall plan. she agrees with it. she will call with any problems that may develop before the next visit here.   Rulon Eisenmenger, MD 02/03/17

## 2017-02-04 DIAGNOSIS — Z6836 Body mass index (BMI) 36.0-36.9, adult: Secondary | ICD-10-CM | POA: Diagnosis not present

## 2017-02-04 DIAGNOSIS — I10 Essential (primary) hypertension: Secondary | ICD-10-CM | POA: Diagnosis not present

## 2017-02-04 DIAGNOSIS — C7931 Secondary malignant neoplasm of brain: Secondary | ICD-10-CM | POA: Diagnosis not present

## 2017-02-04 NOTE — Progress Notes (Signed)
  Radiation Oncology         [phone] ________________________________  Name: Savannah Howard MRN: [id number]  Date: 02/03/2017  DOB: May 18, 1946  End of Treatment Note  Diagnosis:   71 y.o. woman with recurrent metastatic ER positive, HER2 amplified invasive ductal carcinoma of the left breast with disease in the brain  Indication for treatment: Palliative   Radiation treatment dates:   01/21/17 - 02/03/17  Site/dose:  Focal Right Temporal Lobe: 30 Gy in 10 fractions  Beams/energy:   3D // 6X Photon  Narrative: The patient tolerated radiation treatment relatively well. The patient had intermittent right temporal headaches for which she took tylenol, dizziness when rising from a supine position, right eye discomfort, and moderate fatigue. She was on Decadron 6 mg BID and Keppra 100 mg BID. She denied nausea or vomiting. The patient developed oral thrush of the right buccal mucosa during treatment and she was prescribed Diflucan on 01/28/17.  Plan: The patient has completed radiation treatment. The patient will return to radiation oncology clinic for routine followup in one month. I advised her to call or return sooner if she has any questions or concerns related to her recovery or treatment. ________________________________  Sheral Apley. Tammi Klippel, M.D.  This document serves as a record of services personally performed by Tyler Pita, MD. It was created on his behalf by Darcus Austin, a trained medical scribe. The creation of this record is based on the scribe's personal observations and the provider's statements to them. This document has been checked and approved by the attending provider.

## 2017-02-06 DIAGNOSIS — C50412 Malignant neoplasm of upper-outer quadrant of left female breast: Secondary | ICD-10-CM | POA: Diagnosis not present

## 2017-02-06 DIAGNOSIS — F419 Anxiety disorder, unspecified: Secondary | ICD-10-CM | POA: Diagnosis not present

## 2017-02-06 DIAGNOSIS — N182 Chronic kidney disease, stage 2 (mild): Secondary | ICD-10-CM | POA: Diagnosis not present

## 2017-02-06 DIAGNOSIS — C7931 Secondary malignant neoplasm of brain: Secondary | ICD-10-CM | POA: Diagnosis not present

## 2017-02-06 DIAGNOSIS — G40909 Epilepsy, unspecified, not intractable, without status epilepticus: Secondary | ICD-10-CM | POA: Diagnosis not present

## 2017-02-06 DIAGNOSIS — I129 Hypertensive chronic kidney disease with stage 1 through stage 4 chronic kidney disease, or unspecified chronic kidney disease: Secondary | ICD-10-CM | POA: Diagnosis not present

## 2017-02-08 ENCOUNTER — Other Ambulatory Visit: Payer: Self-pay | Admitting: Internal Medicine

## 2017-02-09 DIAGNOSIS — I129 Hypertensive chronic kidney disease with stage 1 through stage 4 chronic kidney disease, or unspecified chronic kidney disease: Secondary | ICD-10-CM | POA: Diagnosis not present

## 2017-02-09 DIAGNOSIS — G40909 Epilepsy, unspecified, not intractable, without status epilepticus: Secondary | ICD-10-CM | POA: Diagnosis not present

## 2017-02-09 DIAGNOSIS — C50412 Malignant neoplasm of upper-outer quadrant of left female breast: Secondary | ICD-10-CM | POA: Diagnosis not present

## 2017-02-09 DIAGNOSIS — N182 Chronic kidney disease, stage 2 (mild): Secondary | ICD-10-CM | POA: Diagnosis not present

## 2017-02-09 DIAGNOSIS — F419 Anxiety disorder, unspecified: Secondary | ICD-10-CM | POA: Diagnosis not present

## 2017-02-09 DIAGNOSIS — C7931 Secondary malignant neoplasm of brain: Secondary | ICD-10-CM | POA: Diagnosis not present

## 2017-02-11 DIAGNOSIS — C50412 Malignant neoplasm of upper-outer quadrant of left female breast: Secondary | ICD-10-CM | POA: Diagnosis not present

## 2017-02-11 DIAGNOSIS — C7931 Secondary malignant neoplasm of brain: Secondary | ICD-10-CM | POA: Diagnosis not present

## 2017-02-11 DIAGNOSIS — G40909 Epilepsy, unspecified, not intractable, without status epilepticus: Secondary | ICD-10-CM | POA: Diagnosis not present

## 2017-02-11 DIAGNOSIS — I129 Hypertensive chronic kidney disease with stage 1 through stage 4 chronic kidney disease, or unspecified chronic kidney disease: Secondary | ICD-10-CM | POA: Diagnosis not present

## 2017-02-11 DIAGNOSIS — N182 Chronic kidney disease, stage 2 (mild): Secondary | ICD-10-CM | POA: Diagnosis not present

## 2017-02-11 DIAGNOSIS — F419 Anxiety disorder, unspecified: Secondary | ICD-10-CM | POA: Diagnosis not present

## 2017-02-12 ENCOUNTER — Telehealth (HOSPITAL_COMMUNITY): Payer: Self-pay | Admitting: Vascular Surgery

## 2017-02-12 NOTE — Telephone Encounter (Signed)
Left VM on caregivers phone to make f/u appt /e echo in aug w/ db

## 2017-02-15 DIAGNOSIS — C7931 Secondary malignant neoplasm of brain: Secondary | ICD-10-CM | POA: Diagnosis not present

## 2017-02-15 DIAGNOSIS — G40909 Epilepsy, unspecified, not intractable, without status epilepticus: Secondary | ICD-10-CM | POA: Diagnosis not present

## 2017-02-15 DIAGNOSIS — C50412 Malignant neoplasm of upper-outer quadrant of left female breast: Secondary | ICD-10-CM | POA: Diagnosis not present

## 2017-02-15 DIAGNOSIS — N182 Chronic kidney disease, stage 2 (mild): Secondary | ICD-10-CM | POA: Diagnosis not present

## 2017-02-15 DIAGNOSIS — I129 Hypertensive chronic kidney disease with stage 1 through stage 4 chronic kidney disease, or unspecified chronic kidney disease: Secondary | ICD-10-CM | POA: Diagnosis not present

## 2017-02-15 DIAGNOSIS — F419 Anxiety disorder, unspecified: Secondary | ICD-10-CM | POA: Diagnosis not present

## 2017-02-17 DIAGNOSIS — C50412 Malignant neoplasm of upper-outer quadrant of left female breast: Secondary | ICD-10-CM | POA: Diagnosis not present

## 2017-02-17 DIAGNOSIS — G40909 Epilepsy, unspecified, not intractable, without status epilepticus: Secondary | ICD-10-CM | POA: Diagnosis not present

## 2017-02-17 DIAGNOSIS — C7931 Secondary malignant neoplasm of brain: Secondary | ICD-10-CM | POA: Diagnosis not present

## 2017-02-17 DIAGNOSIS — I129 Hypertensive chronic kidney disease with stage 1 through stage 4 chronic kidney disease, or unspecified chronic kidney disease: Secondary | ICD-10-CM | POA: Diagnosis not present

## 2017-02-17 DIAGNOSIS — F419 Anxiety disorder, unspecified: Secondary | ICD-10-CM | POA: Diagnosis not present

## 2017-02-17 DIAGNOSIS — N182 Chronic kidney disease, stage 2 (mild): Secondary | ICD-10-CM | POA: Diagnosis not present

## 2017-02-18 MED FILL — TYKERB 250 MG TABLET: 250 | 30 days supply | Qty: 120 | Fill #1

## 2017-02-19 ENCOUNTER — Telehealth: Payer: Self-pay | Admitting: Internal Medicine

## 2017-02-19 NOTE — Telephone Encounter (Signed)
Requesting orders for PT for twice a week for three weeks.

## 2017-02-19 NOTE — Telephone Encounter (Signed)
Verbals given  

## 2017-02-22 DIAGNOSIS — C50412 Malignant neoplasm of upper-outer quadrant of left female breast: Secondary | ICD-10-CM | POA: Diagnosis not present

## 2017-02-22 DIAGNOSIS — N182 Chronic kidney disease, stage 2 (mild): Secondary | ICD-10-CM | POA: Diagnosis not present

## 2017-02-22 DIAGNOSIS — G40909 Epilepsy, unspecified, not intractable, without status epilepticus: Secondary | ICD-10-CM | POA: Diagnosis not present

## 2017-02-22 DIAGNOSIS — F419 Anxiety disorder, unspecified: Secondary | ICD-10-CM | POA: Diagnosis not present

## 2017-02-22 DIAGNOSIS — C7931 Secondary malignant neoplasm of brain: Secondary | ICD-10-CM | POA: Diagnosis not present

## 2017-02-22 DIAGNOSIS — I129 Hypertensive chronic kidney disease with stage 1 through stage 4 chronic kidney disease, or unspecified chronic kidney disease: Secondary | ICD-10-CM | POA: Diagnosis not present

## 2017-02-24 DIAGNOSIS — G40909 Epilepsy, unspecified, not intractable, without status epilepticus: Secondary | ICD-10-CM | POA: Diagnosis not present

## 2017-02-24 DIAGNOSIS — N182 Chronic kidney disease, stage 2 (mild): Secondary | ICD-10-CM | POA: Diagnosis not present

## 2017-02-24 DIAGNOSIS — C7931 Secondary malignant neoplasm of brain: Secondary | ICD-10-CM | POA: Diagnosis not present

## 2017-02-24 DIAGNOSIS — C50412 Malignant neoplasm of upper-outer quadrant of left female breast: Secondary | ICD-10-CM | POA: Diagnosis not present

## 2017-02-24 DIAGNOSIS — I129 Hypertensive chronic kidney disease with stage 1 through stage 4 chronic kidney disease, or unspecified chronic kidney disease: Secondary | ICD-10-CM | POA: Diagnosis not present

## 2017-02-24 DIAGNOSIS — F419 Anxiety disorder, unspecified: Secondary | ICD-10-CM | POA: Diagnosis not present

## 2017-02-25 ENCOUNTER — Encounter: Payer: Self-pay | Admitting: Internal Medicine

## 2017-02-25 ENCOUNTER — Ambulatory Visit (INDEPENDENT_AMBULATORY_CARE_PROVIDER_SITE_OTHER): Payer: Medicare Other | Admitting: Internal Medicine

## 2017-02-25 DIAGNOSIS — C7931 Secondary malignant neoplasm of brain: Secondary | ICD-10-CM

## 2017-02-25 DIAGNOSIS — E538 Deficiency of other specified B group vitamins: Secondary | ICD-10-CM | POA: Diagnosis not present

## 2017-02-25 DIAGNOSIS — N170 Acute kidney failure with tubular necrosis: Secondary | ICD-10-CM | POA: Diagnosis not present

## 2017-02-25 DIAGNOSIS — E559 Vitamin D deficiency, unspecified: Secondary | ICD-10-CM

## 2017-02-25 DIAGNOSIS — I10 Essential (primary) hypertension: Secondary | ICD-10-CM

## 2017-02-25 DIAGNOSIS — C50911 Malignant neoplasm of unspecified site of right female breast: Secondary | ICD-10-CM

## 2017-02-25 DIAGNOSIS — N183 Chronic kidney disease, stage 3 (moderate): Secondary | ICD-10-CM

## 2017-02-25 NOTE — Assessment & Plan Note (Signed)
Start Vit D prescription 50000 iu weekly (Rx emailed to your pharmacy) followed by over-the-counter Vit D 1000 iu daily.

## 2017-02-25 NOTE — Progress Notes (Signed)
Subjective:  Patient ID: Savannah Howard, female    DOB: 1946/05/31  Age: 71 y.o. MRN: [id number]  CC: No chief complaint on file.   HPI Savannah Howard presents for HTN, breast ca w/mets, Vit B12 and D def f/u  Outpatient Medications Prior to Visit  Medication Sig Dispense Refill  . acetaminophen (TYLENOL) 500 MG tablet Take 1,000 mg by mouth at bedtime as needed for mild pain or headache.     . anastrozole (ARIMIDEX) 1 MG tablet TAKE 1 TABLET (1 MG TOTAL) BY MOUTH DAILY. 90 tablet 2  . Cholecalciferol (VITAMIN D3) 2000 units capsule Take 1 capsule (2,000 Units total) by mouth daily. 100 capsule 3  . Cholecalciferol (VITAMIN D3) 50000 units CAPS Take 1 capsule by mouth once a week. 8 capsule 0  . cyanocobalamin (,VITAMIN B-12,) 1000 MCG/ML injection INJECT 1 ML SUBCUTANEOUSLY EVERY 30 DAYS 1 mL 3  . dexamethasone (DECADRON) 6 MG tablet Take 1 tablet (6 mg total) by mouth 2 (two) times daily. 60 tablet 0  . fluconazole (DIFLUCAN) 100 MG tablet Take 1 tablet (100 mg total) by mouth daily. Take 2 pills on first day 8 tablet 0  . lapatinib (TYKERB) 250 MG tablet Take 4 tablets (1,000 mg total) by mouth daily. 120 tablet 3  . levETIRAcetam (KEPPRA) 1000 MG tablet Take 1 tablet (1,000 mg total) by mouth 2 (two) times daily. 60 tablet 1  . LORazepam (ATIVAN) 0.5 MG tablet TAKE 1 TABLET BY MOUTH 3 TIMES A DAY AS NEEDED FOR ANXIETY 270 tablet 0  . losartan-hydrochlorothiazide (HYZAAR) 100-25 MG tablet TAKE 1 TABLET BY MOUTH EVERY DAY 90 tablet 0  . magnesium gluconate (MAGONATE) 500 MG tablet Take 1 tablet (500 mg total) by mouth 2 (two) times daily. 60 tablet 11  . metoprolol succinate (TOPROL-XL) 50 MG 24 hr tablet TAKE 1/2 TABLET (25 MG TOTAL) BY MOUTH EVERY DAY 15 tablet 1  . omeprazole (PRILOSEC) 20 MG capsule Take 20 mg by mouth daily.    . potassium chloride (MICRO-K) 10 MEQ CR capsule TAKE ONE CAPSULE BY MOUTH EVERY DAY 90 capsule 0  . terazosin (HYTRIN) 5 MG capsule Take 5 mg by mouth at  bedtime. Do not crush    . vitamin B-12 (CYANOCOBALAMIN) 1000 MCG tablet Take 1,000 mcg by mouth daily.     No facility-administered medications prior to visit.     ROS Review of Systems  Constitutional: Positive for fatigue. Negative for activity change, appetite change, chills and unexpected weight change.  HENT: Negative for congestion, mouth sores and sinus pressure.   Eyes: Negative for visual disturbance.  Respiratory: Negative for cough and chest tightness.   Gastrointestinal: Negative for abdominal pain and nausea.  Genitourinary: Negative for difficulty urinating, frequency and vaginal pain.  Musculoskeletal: Positive for arthralgias and gait problem. Negative for back pain.  Skin: Negative for pallor and rash.  Neurological: Positive for weakness and light-headedness. Negative for dizziness, tremors, numbness and headaches.  Psychiatric/Behavioral: Positive for sleep disturbance. Negative for confusion and suicidal ideas.    Objective:  BP 132/64 (BP Location: Left Arm, Patient Position: Sitting, Cuff Size: Normal)   Pulse 62   Temp 98.7 F (37.1 C) (Oral)   Ht 5\' 3"  (1.6 m)   Wt 203 lb (92.1 kg)   SpO2 98%   BMI 35.96 kg/m   BP Readings from Last 3 Encounters:  02/25/17 132/64  02/03/17 (!) 148/51  01/22/17 122/74    Wt Readings from Last 3 Encounters:  02/25/17 203 lb (92.1 kg)  02/03/17 198 lb 1.6 oz (89.9 kg)  01/22/17 194 lb (88 kg)    Physical Exam  Constitutional: She appears well-developed. No distress.  HENT:  Head: Normocephalic.  Right Ear: External ear normal.  Left Ear: External ear normal.  Nose: Nose normal.  Mouth/Throat: Oropharynx is clear and moist.  Eyes: Pupils are equal, round, and reactive to light. Conjunctivae are normal. Right eye exhibits no discharge. Left eye exhibits no discharge.  Neck: Normal range of motion. Neck supple. No JVD present. No tracheal deviation present. No thyromegaly present.  Cardiovascular: Normal rate,  regular rhythm and normal heart sounds.   Pulmonary/Chest: No stridor. No respiratory distress. She has no wheezes.  Abdominal: Soft. Bowel sounds are normal. She exhibits no distension and no mass. There is no tenderness. There is no rebound and no guarding.  Musculoskeletal: She exhibits tenderness. She exhibits no edema.  Lymphadenopathy:    She has no cervical adenopathy.  Neurological: She displays normal reflexes. No cranial nerve deficit. She exhibits normal muscle tone. Coordination abnormal.  Skin: No rash noted. No erythema.  Psychiatric: She has a normal mood and affect. Her behavior is normal. Judgment and thought content normal.  cushingoid face, obese A/o/c Walker  Lab Results  Component Value Date   WBC 9.2 01/06/2017   HGB 13.9 01/06/2017   HCT 41.0 01/06/2017   PLT 279 01/06/2017   GLUCOSE 118 (H) 02/03/2017   CHOL 152 11/05/2014   TRIG 189.0 (H) 11/05/2014   HDL 45.00 11/05/2014   LDLDIRECT 77.3 10/26/2012   LDLCALC 69 11/05/2014   ALT 18 01/06/2017   AST 25 01/06/2017   NA 137 02/03/2017   K 4.7 02/03/2017   CL 105 02/03/2017   CREATININE 1.05 02/03/2017   BUN 41 (H) 02/03/2017   CO2 22 02/03/2017   TSH 0.35 02/03/2017   INR 0.99 01/06/2017   HGBA1C 6.3 02/03/2017    No results found.  Assessment & Plan:   There are no diagnoses linked to this encounter. I am having Ms. Calvin maintain her vitamin B-12, acetaminophen, terazosin, omeprazole, LORazepam, anastrozole, potassium chloride, levETIRAcetam, dexamethasone, losartan-hydrochlorothiazide, lapatinib, fluconazole, magnesium gluconate, metoprolol succinate, Vitamin D3, Vitamin D3, and cyanocobalamin.  No orders of the defined types were placed in this encounter.    Follow-up: No Follow-up on file.  Walker Kehr, MD

## 2017-02-25 NOTE — Assessment & Plan Note (Signed)
Monitor labs 

## 2017-02-25 NOTE — Assessment & Plan Note (Signed)
On B12 

## 2017-02-25 NOTE — Assessment & Plan Note (Signed)
F/u w/Med Onc She finished her XRT

## 2017-02-25 NOTE — Assessment & Plan Note (Signed)
Hyzaar, Toprol and Terazosin

## 2017-03-01 DIAGNOSIS — N182 Chronic kidney disease, stage 2 (mild): Secondary | ICD-10-CM | POA: Diagnosis not present

## 2017-03-01 DIAGNOSIS — C7931 Secondary malignant neoplasm of brain: Secondary | ICD-10-CM | POA: Diagnosis not present

## 2017-03-01 DIAGNOSIS — I129 Hypertensive chronic kidney disease with stage 1 through stage 4 chronic kidney disease, or unspecified chronic kidney disease: Secondary | ICD-10-CM | POA: Diagnosis not present

## 2017-03-01 DIAGNOSIS — C50412 Malignant neoplasm of upper-outer quadrant of left female breast: Secondary | ICD-10-CM | POA: Diagnosis not present

## 2017-03-01 DIAGNOSIS — F419 Anxiety disorder, unspecified: Secondary | ICD-10-CM | POA: Diagnosis not present

## 2017-03-01 DIAGNOSIS — G40909 Epilepsy, unspecified, not intractable, without status epilepticus: Secondary | ICD-10-CM | POA: Diagnosis not present

## 2017-03-02 ENCOUNTER — Other Ambulatory Visit: Payer: Self-pay

## 2017-03-02 DIAGNOSIS — Z170 Estrogen receptor positive status [ER+]: Principal | ICD-10-CM

## 2017-03-02 DIAGNOSIS — C50412 Malignant neoplasm of upper-outer quadrant of left female breast: Secondary | ICD-10-CM

## 2017-03-02 NOTE — Assessment & Plan Note (Signed)
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 TCH-Perjeta 6 cycles started 12/31/2014 completed 04/22/2015 (decreased chemotherapy dosage with cycle 2 and discontinued Perjeta for diarrhea) 2. Left mastectomy 05/30/2015: CR to chemo, 0/6 LN  Did not need radiation based upon tumor board recommendation 3. Herceptin maintenance completed May 2017 4. Brain metastases diagnosed November 2017  Treatment plan:  1. Stereotactic radiosurgery by Dr. Tammi Klippel 08/13/16 to 08/24/16 2. Anastrozole started 09/08/2016  3. SRS to recurrent disease 01/21/2017  01/06/2017: Progression: Right anterior temporal lobe 14 mm (was 6 mm); right anterior temporal lobe 11 mm (was 5 mm) right posterior temporal due to movement 15 mm (was 6 mm) right parietal dural based 11 mm (was 6 mm) left parietal lobe stable 7 x 8 mm 18 x 29 mm thick and irregular enhancement right temporal lobe resection cavity  Current treatment: Lapatinib 1000 mg by mouth daily along with anastrozole 1 mg daily  We will need to obtain an echocardiogram and an EKG  Return to clinic in one month for follow-up

## 2017-03-03 ENCOUNTER — Other Ambulatory Visit (HOSPITAL_BASED_OUTPATIENT_CLINIC_OR_DEPARTMENT_OTHER): Payer: Medicare Other

## 2017-03-03 ENCOUNTER — Ambulatory Visit (HOSPITAL_BASED_OUTPATIENT_CLINIC_OR_DEPARTMENT_OTHER): Payer: Medicare Other | Admitting: Hematology and Oncology

## 2017-03-03 ENCOUNTER — Telehealth: Payer: Self-pay

## 2017-03-03 ENCOUNTER — Encounter: Payer: Self-pay | Admitting: Hematology and Oncology

## 2017-03-03 DIAGNOSIS — G40909 Epilepsy, unspecified, not intractable, without status epilepticus: Secondary | ICD-10-CM | POA: Diagnosis not present

## 2017-03-03 DIAGNOSIS — M519 Unspecified thoracic, thoracolumbar and lumbosacral intervertebral disc disorder: Secondary | ICD-10-CM | POA: Diagnosis not present

## 2017-03-03 DIAGNOSIS — C7931 Secondary malignant neoplasm of brain: Secondary | ICD-10-CM | POA: Diagnosis not present

## 2017-03-03 DIAGNOSIS — C50412 Malignant neoplasm of upper-outer quadrant of left female breast: Secondary | ICD-10-CM

## 2017-03-03 DIAGNOSIS — Z170 Estrogen receptor positive status [ER+]: Secondary | ICD-10-CM | POA: Diagnosis not present

## 2017-03-03 DIAGNOSIS — I129 Hypertensive chronic kidney disease with stage 1 through stage 4 chronic kidney disease, or unspecified chronic kidney disease: Secondary | ICD-10-CM | POA: Diagnosis not present

## 2017-03-03 DIAGNOSIS — K219 Gastro-esophageal reflux disease without esophagitis: Secondary | ICD-10-CM | POA: Diagnosis not present

## 2017-03-03 DIAGNOSIS — Z79811 Long term (current) use of aromatase inhibitors: Secondary | ICD-10-CM

## 2017-03-03 DIAGNOSIS — D631 Anemia in chronic kidney disease: Secondary | ICD-10-CM | POA: Diagnosis not present

## 2017-03-03 DIAGNOSIS — E785 Hyperlipidemia, unspecified: Secondary | ICD-10-CM | POA: Diagnosis not present

## 2017-03-03 DIAGNOSIS — E669 Obesity, unspecified: Secondary | ICD-10-CM | POA: Diagnosis not present

## 2017-03-03 DIAGNOSIS — R197 Diarrhea, unspecified: Secondary | ICD-10-CM | POA: Diagnosis not present

## 2017-03-03 DIAGNOSIS — N182 Chronic kidney disease, stage 2 (mild): Secondary | ICD-10-CM | POA: Diagnosis not present

## 2017-03-03 DIAGNOSIS — F419 Anxiety disorder, unspecified: Secondary | ICD-10-CM

## 2017-03-03 LAB — CBC WITH DIFFERENTIAL/PLATELET
BASO%: 0.1 % (ref 0.0–2.0)
Basophils Absolute: 0 10*3/uL (ref 0.0–0.1)
EOS%: 0.1 % (ref 0.0–7.0)
Eosinophils Absolute: 0 10*3/uL (ref 0.0–0.5)
HCT: 38 % (ref 34.8–46.6)
HGB: 12.7 g/dL (ref 11.6–15.9)
LYMPH%: 49.4 % (ref 14.0–49.7)
MCH: 32.6 pg (ref 25.1–34.0)
MCHC: 33.4 g/dL (ref 31.5–36.0)
MCV: 97.7 fL (ref 79.5–101.0)
MONO#: 0.8 10*3/uL (ref 0.1–0.9)
MONO%: 8.3 % (ref 0.0–14.0)
NEUT#: 4 10*3/uL (ref 1.5–6.5)
NEUT%: 42.1 % (ref 38.4–76.8)
Platelets: 201 10*3/uL (ref 145–400)
RBC: 3.89 10*6/uL (ref 3.70–5.45)
RDW: 14.7 % — AB (ref 11.2–14.5)
WBC: 9.5 10*3/uL (ref 3.9–10.3)
lymph#: 4.7 10*3/uL — ABNORMAL HIGH (ref 0.9–3.3)

## 2017-03-03 LAB — COMPREHENSIVE METABOLIC PANEL
ALT: 18 U/L (ref 0–55)
AST: 9 U/L (ref 5–34)
Albumin: 3 g/dL — ABNORMAL LOW (ref 3.5–5.0)
Alkaline Phosphatase: 70 U/L (ref 40–150)
Anion Gap: 16 mEq/L — ABNORMAL HIGH (ref 3–11)
BUN: 31.5 mg/dL — AB (ref 7.0–26.0)
CHLORIDE: 104 meq/L (ref 98–109)
CO2: 20 meq/L — AB (ref 22–29)
Calcium: 9.1 mg/dL (ref 8.4–10.4)
Creatinine: 1.1 mg/dL (ref 0.6–1.1)
EGFR: 49 mL/min/{1.73_m2} — ABNORMAL LOW (ref >90)
GLUCOSE: 104 mg/dL (ref 70–140)
POTASSIUM: 3 meq/L — AB (ref 3.5–5.1)
SODIUM: 140 meq/L (ref 136–145)
Total Bilirubin: 0.82 mg/dL (ref 0.20–1.20)
Total Protein: 6.4 g/dL (ref 6.4–8.3)

## 2017-03-03 NOTE — Progress Notes (Signed)
Patient Care Team: [name], [name], MD as PCP - General  DIAGNOSIS:  Encounter Diagnosis  Name Primary?  . Malignant neoplasm of upper-outer quadrant of left breast in female, estrogen receptor positive ([facility])     SUMMARY OF ONCOLOGIC HISTORY:   Breast cancer of upper-outer quadrant of left female breast ([facility])   12/07/2014 Initial Diagnosis    Left breast 2:00 position: Invasive ductal carcinoma grade 2, ER 5%, PR 0%, Ki-67 80%, HER-2 positive ratio 6.96      12/07/2014 Mammogram    Large left breast mass 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, 2 small benign nodules in the right breast stable since 2002, other consistent with normal intramammary lymph node left axilla ultrasound normal-sized lymph nodes      12/31/2014 - 04/22/2015 Neo-Adjuvant Chemotherapy    TCH Perjeta neoadjuvant chemotherapy 6 (decreased chemotherapy dosage with cycle 2 and discontinued Perjeta for diarrhea)       04/29/2015 Breast MRI    Significant interval decrease in size of the mass, continued skin retraction, 4.3 x 2 x 1.8 cm (previously 4.9 x 3.9 x 3.9 cm)      05/30/2015 Surgery    Left mastectomy: Path CR, 0/6 LN Negative      09/09/2015 -  Anti-estrogen oral therapy    Anastrozole 1 mg daily, Lapatinib added 09/09/15      07/09/2016 Imaging    Patient presented in the emergency department after a fall at home without help for 24 hours, CT head revealed 3.9 x 3.5 cm cystic mass anterior right temporal lobe with vasogenic edema, MRI revealed 4.9 cm mass      07/09/2016 - 07/16/2016 Hospital Admission    Admitted with brain metastases after a fall at home, rhabdomyolysis       07/13/2016 Surgery    Craniotomy by Dr.Ditty: Metastatic breast cancer ER 40%, PR 0%, HER-2 positive; postcraniotomy MRI showed residual enhancement along the anterior and superior margin, stable 1.3 cm left parietal lobe met.      08/11/2016 Imaging    Interval progression of Rt temporal mets 3.2 cm, Lt parietal intra axial mets 1 cm  size       08/13/2016 - 08/24/2016 Radiation Therapy    SRS Rt Temporal and Left Parietal lobes      11/12/2016 Relapse/Recurrence    Mixed interval changes in the right temporal lobe. Partial collapse of the resection cavity with improved enhancement posteriorly but increased enhancement anteriorly, decrease in the left parietal metastases 2 subcentimeter foci of nodular extra-axial enhancement right temporal and right parietal region      01/06/2017 Imaging    Progression: Right anterior temporal pole 14 mm (was 6 mm); right anterior temporal lobe 11 mm (was 5 mm) right posterior temporal due to movement 15 mm (was 6 mm) right parietal dural based 11 mm (was 6 mm) left parietal lobe stable 7 x 8 mm 18 x 29 mm thick and irregular enhancement right temporal lobe resection cavity      02/03/2017 -  Chemotherapy    Lapatinib and anastrozole       CHIEF COMPLIANT: Follow-up on lapatinib and anastrozole  INTERVAL HISTORY: Savannah Howard is a 71 year old with metastatic breast cancer with brain metastases who is currently on lapatinib and anastrozole. She reports to have continued problems with mobility. She was using a walker to get around. She has been on dexamethasone followed long and appears to have marked puffiness of the face. Denies any headaches or blurred vision or  lightheadedness. She does manage her activities of daily living but requires assistance with grocery shopping. She does have diarrhea from lapatinib and has had a few accidents. She tells me that she can manage it and does not want me today. Use the dosage.  REVIEW OF SYSTEMS:   Constitutional: Denies fevers, chills or abnormal weight loss Eyes: Denies blurriness of vision Ears, nose, mouth, throat, and face: Denies mucositis or sore throat Respiratory: Denies cough, dyspnea or wheezes Cardiovascular: Denies palpitation, chest discomfort Gastrointestinal: Diarrhea Skin: Denies abnormal skin rashes Lymphatics: Denies new  lymphadenopathy or easy bruising Neurological: Generalized weakness Behavioral/Psych: Mood is stable, no new changes  Extremities: No lower extremity edema Breast:  denies any pain or lumps or nodules in either breasts All other systems were reviewed with the patient and are negative.  I have reviewed the past medical history, past surgical history, social history and family history with the patient and they are unchanged from previous note.  ALLERGIES:  is allergic to penicillins and sulfonamide derivatives.  MEDICATIONS:  Current Outpatient Prescriptions  Medication Sig Dispense Refill  . acetaminophen (TYLENOL) 500 MG tablet Take 1,000 mg by mouth at bedtime as needed for mild pain or headache.     . anastrozole (ARIMIDEX) 1 MG tablet TAKE 1 TABLET (1 MG TOTAL) BY MOUTH DAILY. 90 tablet 2  . Cholecalciferol (VITAMIN D3) 2000 units capsule Take 1 capsule (2,000 Units total) by mouth daily. 100 capsule 3  . Cholecalciferol (VITAMIN D3) 50000 units CAPS Take 1 capsule by mouth once a week. 8 capsule 0  . cyanocobalamin (,VITAMIN B-12,) 1000 MCG/ML injection INJECT 1 ML SUBCUTANEOUSLY EVERY 30 DAYS 1 mL 3  . dexamethasone (DECADRON) 6 MG tablet Take 1 tablet (6 mg total) by mouth 2 (two) times daily. 60 tablet 0  . fluconazole (DIFLUCAN) 100 MG tablet Take 1 tablet (100 mg total) by mouth daily. Take 2 pills on first day 8 tablet 0  . lapatinib (TYKERB) 250 MG tablet Take 4 tablets (1,000 mg total) by mouth daily. 120 tablet 3  . levETIRAcetam (KEPPRA) 1000 MG tablet Take 1 tablet (1,000 mg total) by mouth 2 (two) times daily. 60 tablet 1  . LORazepam (ATIVAN) 0.5 MG tablet TAKE 1 TABLET BY MOUTH 3 TIMES A DAY AS NEEDED FOR ANXIETY 270 tablet 0  . losartan-hydrochlorothiazide (HYZAAR) 100-25 MG tablet TAKE 1 TABLET BY MOUTH EVERY DAY 90 tablet 0  . magnesium gluconate (MAGONATE) 500 MG tablet Take 1 tablet (500 mg total) by mouth 2 (two) times daily. 60 tablet 11  . metoprolol succinate  (TOPROL-XL) 50 MG 24 hr tablet TAKE 1/2 TABLET (25 MG TOTAL) BY MOUTH EVERY DAY 15 tablet 1  . omeprazole (PRILOSEC) 20 MG capsule Take 20 mg by mouth daily.    . potassium chloride (MICRO-K) 10 MEQ CR capsule TAKE ONE CAPSULE BY MOUTH EVERY DAY 90 capsule 0  . terazosin (HYTRIN) 5 MG capsule Take 5 mg by mouth at bedtime. Do not crush    . vitamin B-12 (CYANOCOBALAMIN) 1000 MCG tablet Take 1,000 mcg by mouth daily.     No current facility-administered medications for this visit.     PHYSICAL EXAMINATION: ECOG PERFORMANCE STATUS: 2 - Symptomatic, <50% confined to bed  Vitals:   03/03/17 0916  BP: 111/71  Pulse: 73  Resp: 17  Temp: 97.6 F (36.4 C)   Filed Weights   03/03/17 0916  Weight: 195 lb 12.8 oz (88.8 kg)    GENERAL:alert, no distress and comfortable,  Puffiness of the face SKIN: skin color, texture, turgor are normal, no rashes or significant lesions EYES: normal, Conjunctiva are pink and non-injected, sclera clear OROPHARYNX:no exudate, no erythema and lips, buccal mucosa, and tongue normal  NECK: supple, thyroid normal size, non-tender, without nodularity LYMPH:  no palpable lymphadenopathy in the cervical, axillary or inguinal LUNGS: clear to auscultation and percussion with normal breathing effort HEART: regular rate & rhythm and no murmurs and no lower extremity edema ABDOMEN:abdomen soft, non-tender and normal bowel sounds MUSCULOSKELETAL:no cyanosis of digits and no clubbing  NEURO: alert & oriented x 3 with fluent speech, Lower extremity weakness EXTREMITIES: No lower extremity edema   LABORATORY DATA:  I have reviewed the data as listed   Chemistry      Component Value Date/Time   NA 140 03/03/2017 0858   K 3.0 (LL) 03/03/2017 0858   CL 105 02/03/2017 0942   CO2 20 (L) 03/03/2017 0858   BUN 31.5 (H) 03/03/2017 0858   CREATININE 1.1 03/03/2017 0858   GLU 115 07/20/2016      Component Value Date/Time   CALCIUM 9.1 03/03/2017 0858   ALKPHOS 70  03/03/2017 0858   AST 9 03/03/2017 0858   ALT 18 03/03/2017 0858   BILITOT 0.82 03/03/2017 0858       Lab Results  Component Value Date   WBC 9.5 03/03/2017   HGB 12.7 03/03/2017   HCT 38.0 03/03/2017   MCV 97.7 03/03/2017   PLT 201 03/03/2017   NEUTROABS 4.0 03/03/2017    ASSESSMENT & PLAN:  Breast cancer of upper-outer quadrant of left female breast ([facility]) 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 TCH-Perjeta 6 cycles started 12/31/2014 completed 04/22/2015 (decreased chemotherapy dosage with cycle 2 and discontinued Perjeta for diarrhea) 2. Left mastectomy 05/30/2015: CR to chemo, 0/6 LN  Did not need radiation based upon tumor board recommendation 3. Herceptin maintenance completed May 2017 4. Brain metastases diagnosed November 2017  Treatment plan:  1. Stereotactic radiosurgery by Dr. Kathrynn Running 08/13/16 to 08/24/16 2. Anastrozole started 09/08/2016  3. SRS to recurrent disease 01/21/2017  01/06/2017: Progression: Right anterior temporal lobe 14 mm (was 6 mm); right anterior temporal lobe 11 mm (was 5 mm) right posterior temporal due to movement 15 mm (was 6 mm) right parietal dural based 11 mm (was 6 mm) left parietal lobe stable 7 x 8 mm 18 x 29 mm thick and irregular enhancement right temporal lobe resection cavity  Current treatment: Lapatinib 1000 mg by mouth daily along with anastrozole 1 mg daily started 02/03/2017 low I will Plan to reduce the dexamethasone by half. Our goal is to discontinue dexamethasone by the end of the month.  Return to clinic in one month for follow-up. Plan is to obtain scans after 3 months of therapy.   I spent 25 minutes talking to the patient of which more than half was spent in counseling and coordination of care.  Orders Placed This Encounter  Procedures  . CBC  with Differential    Standing Status:   Future    Standing Expiration Date:   03/03/2018  . Comprehensive metabolic panel    Standing Status:   Future    Standing Expiration Date:   03/03/2018   The patient has a good understanding of the overall plan. she agrees with  it. she will call with any problems that may develop before the next visit here.   Rulon Eisenmenger, MD 03/03/17

## 2017-03-03 NOTE — Telephone Encounter (Signed)
Called pt to let her know that her potassium was low 3.0. LVM to have pt increase her potassium supplement to 2x daily and to increase high potassium food intake (bananas, orange, apple juice and dark green leafy vegetables to name a few.) Gave call back number to have pt confirm that she received message regarding potassium level.

## 2017-03-05 ENCOUNTER — Other Ambulatory Visit: Payer: Self-pay | Admitting: Neurological Surgery

## 2017-03-05 DIAGNOSIS — C7931 Secondary malignant neoplasm of brain: Secondary | ICD-10-CM

## 2017-03-08 DIAGNOSIS — I129 Hypertensive chronic kidney disease with stage 1 through stage 4 chronic kidney disease, or unspecified chronic kidney disease: Secondary | ICD-10-CM | POA: Diagnosis not present

## 2017-03-08 DIAGNOSIS — C7931 Secondary malignant neoplasm of brain: Secondary | ICD-10-CM | POA: Diagnosis not present

## 2017-03-08 DIAGNOSIS — C50412 Malignant neoplasm of upper-outer quadrant of left female breast: Secondary | ICD-10-CM | POA: Diagnosis not present

## 2017-03-08 DIAGNOSIS — N182 Chronic kidney disease, stage 2 (mild): Secondary | ICD-10-CM | POA: Diagnosis not present

## 2017-03-08 DIAGNOSIS — F419 Anxiety disorder, unspecified: Secondary | ICD-10-CM | POA: Diagnosis not present

## 2017-03-08 DIAGNOSIS — G40909 Epilepsy, unspecified, not intractable, without status epilepticus: Secondary | ICD-10-CM | POA: Diagnosis not present

## 2017-03-10 ENCOUNTER — Telehealth: Payer: Self-pay | Admitting: Internal Medicine

## 2017-03-10 DIAGNOSIS — F419 Anxiety disorder, unspecified: Secondary | ICD-10-CM | POA: Diagnosis not present

## 2017-03-10 DIAGNOSIS — G40909 Epilepsy, unspecified, not intractable, without status epilepticus: Secondary | ICD-10-CM | POA: Diagnosis not present

## 2017-03-10 DIAGNOSIS — I129 Hypertensive chronic kidney disease with stage 1 through stage 4 chronic kidney disease, or unspecified chronic kidney disease: Secondary | ICD-10-CM | POA: Diagnosis not present

## 2017-03-10 DIAGNOSIS — C7931 Secondary malignant neoplasm of brain: Secondary | ICD-10-CM | POA: Diagnosis not present

## 2017-03-10 DIAGNOSIS — N182 Chronic kidney disease, stage 2 (mild): Secondary | ICD-10-CM | POA: Diagnosis not present

## 2017-03-10 DIAGNOSIS — C50412 Malignant neoplasm of upper-outer quadrant of left female breast: Secondary | ICD-10-CM | POA: Diagnosis not present

## 2017-03-10 NOTE — Telephone Encounter (Signed)
Savannah Howard from [facility] called requesting verbal orders to continue therapy twice a week for 6 weeks.

## 2017-03-10 NOTE — Telephone Encounter (Signed)
Ok Thx 

## 2017-03-11 NOTE — Telephone Encounter (Signed)
Notified Laura w/ MD response../lmb ?

## 2017-03-12 ENCOUNTER — Telehealth: Payer: Self-pay

## 2017-03-12 DIAGNOSIS — C50412 Malignant neoplasm of upper-outer quadrant of left female breast: Secondary | ICD-10-CM | POA: Diagnosis not present

## 2017-03-12 DIAGNOSIS — E785 Hyperlipidemia, unspecified: Secondary | ICD-10-CM | POA: Diagnosis not present

## 2017-03-12 DIAGNOSIS — E669 Obesity, unspecified: Secondary | ICD-10-CM | POA: Diagnosis not present

## 2017-03-12 DIAGNOSIS — D631 Anemia in chronic kidney disease: Secondary | ICD-10-CM | POA: Diagnosis not present

## 2017-03-12 DIAGNOSIS — F419 Anxiety disorder, unspecified: Secondary | ICD-10-CM | POA: Diagnosis not present

## 2017-03-12 DIAGNOSIS — C7931 Secondary malignant neoplasm of brain: Secondary | ICD-10-CM | POA: Diagnosis not present

## 2017-03-12 DIAGNOSIS — M519 Unspecified thoracic, thoracolumbar and lumbosacral intervertebral disc disorder: Secondary | ICD-10-CM | POA: Diagnosis not present

## 2017-03-12 DIAGNOSIS — N182 Chronic kidney disease, stage 2 (mild): Secondary | ICD-10-CM | POA: Diagnosis not present

## 2017-03-12 DIAGNOSIS — K219 Gastro-esophageal reflux disease without esophagitis: Secondary | ICD-10-CM | POA: Diagnosis not present

## 2017-03-12 DIAGNOSIS — G40909 Epilepsy, unspecified, not intractable, without status epilepticus: Secondary | ICD-10-CM | POA: Diagnosis not present

## 2017-03-12 DIAGNOSIS — I129 Hypertensive chronic kidney disease with stage 1 through stage 4 chronic kidney disease, or unspecified chronic kidney disease: Secondary | ICD-10-CM | POA: Diagnosis not present

## 2017-03-12 NOTE — Telephone Encounter (Signed)
Savannah Howard her good friend and POA saw her Tuesday night. Today pt called Savannah Howard that she is running to bathroom every 20-30 minutes. She is incontinent at times. She is using pullups. She is eating and drinking. She is using imodium with no help. Weaker, a little confused. eg she was not aware on Tuesday night that it was night time, she gave Savannah Howard a grocery list and then forgot she gave her the list. She had a problem with diarrhea before she got sick. Now it is worse. She started lapatinib on 6/27.  S/w pt that the BM problem has been going on for 3 days. She states she has drank about 5 bottles of water today and is keeping hydrated. Stools are very loose, about 1/2 to 3/4 cup, in pullups. Temp yesterday was 99.1. Urine not noticed if it is dark. Pt is weak and PT did not do much with her yesterday.   S/w Dr Alvy Bimler. Stop lapatinib, gatorade for electrolytes, 2 liters/day. 4 mg tid scheduled over the weekend call. ER if cannot hydrate herself   Called Savannah Howard back with directions. Instructed her to call back Monday or Tuesday with update.  Pt is asking when to restart lapatinib.

## 2017-03-12 NOTE — Telephone Encounter (Signed)
Will notify Dr.Gudena on Monday and let him know of pt symptoms. Thankyou!

## 2017-03-14 ENCOUNTER — Other Ambulatory Visit: Payer: Self-pay | Admitting: Internal Medicine

## 2017-03-15 ENCOUNTER — Other Ambulatory Visit (HOSPITAL_COMMUNITY): Payer: Self-pay | Admitting: *Deleted

## 2017-03-15 ENCOUNTER — Ambulatory Visit (HOSPITAL_BASED_OUTPATIENT_CLINIC_OR_DEPARTMENT_OTHER)
Admission: RE | Admit: 2017-03-15 | Discharge: 2017-03-15 | Disposition: A | Discharge status: Home / Self Care | Payer: Medicare Other | Source: Ambulatory Visit | Attending: Internal Medicine | Admitting: Internal Medicine

## 2017-03-15 ENCOUNTER — Encounter (HOSPITAL_COMMUNITY): Payer: Self-pay | Admitting: Internal Medicine

## 2017-03-15 ENCOUNTER — Ambulatory Visit (HOSPITAL_COMMUNITY)
Admission: RE | Admit: 2017-03-15 | Discharge: 2017-03-15 | Disposition: A | Discharge status: Home / Self Care | Payer: Medicare Other | Source: Ambulatory Visit | Attending: Internal Medicine | Admitting: Internal Medicine

## 2017-03-15 VITALS — BP 118/70 | HR 68 | Wt 189.8 lb

## 2017-03-15 DIAGNOSIS — Z171 Estrogen receptor negative status [ER-]: Secondary | ICD-10-CM

## 2017-03-15 DIAGNOSIS — E538 Deficiency of other specified B group vitamins: Secondary | ICD-10-CM | POA: Diagnosis not present

## 2017-03-15 DIAGNOSIS — F419 Anxiety disorder, unspecified: Secondary | ICD-10-CM | POA: Insufficient documentation

## 2017-03-15 DIAGNOSIS — Z880 Allergy status to penicillin: Secondary | ICD-10-CM | POA: Diagnosis not present

## 2017-03-15 DIAGNOSIS — C50412 Malignant neoplasm of upper-outer quadrant of left female breast: Secondary | ICD-10-CM | POA: Insufficient documentation

## 2017-03-15 DIAGNOSIS — C50012 Malignant neoplasm of nipple and areola, left female breast: Principal | ICD-10-CM

## 2017-03-15 DIAGNOSIS — I10 Essential (primary) hypertension: Secondary | ICD-10-CM

## 2017-03-15 DIAGNOSIS — Z6834 Body mass index (BMI) 34.0-34.9, adult: Secondary | ICD-10-CM | POA: Insufficient documentation

## 2017-03-15 DIAGNOSIS — Z8249 Family history of ischemic heart disease and other diseases of the circulatory system: Secondary | ICD-10-CM | POA: Insufficient documentation

## 2017-03-15 DIAGNOSIS — E669 Obesity, unspecified: Secondary | ICD-10-CM | POA: Diagnosis not present

## 2017-03-15 DIAGNOSIS — E559 Vitamin D deficiency, unspecified: Secondary | ICD-10-CM | POA: Diagnosis not present

## 2017-03-15 DIAGNOSIS — Z9012 Acquired absence of left breast and nipple: Secondary | ICD-10-CM | POA: Insufficient documentation

## 2017-03-15 DIAGNOSIS — C7931 Secondary malignant neoplasm of brain: Secondary | ICD-10-CM | POA: Diagnosis not present

## 2017-03-15 DIAGNOSIS — M5136 Other intervertebral disc degeneration, lumbar region: Secondary | ICD-10-CM | POA: Diagnosis not present

## 2017-03-15 DIAGNOSIS — K219 Gastro-esophageal reflux disease without esophagitis: Secondary | ICD-10-CM | POA: Diagnosis not present

## 2017-03-15 DIAGNOSIS — C50011 Malignant neoplasm of nipple and areola, right female breast: Secondary | ICD-10-CM | POA: Diagnosis not present

## 2017-03-15 DIAGNOSIS — Z9221 Personal history of antineoplastic chemotherapy: Secondary | ICD-10-CM | POA: Diagnosis not present

## 2017-03-15 DIAGNOSIS — Z882 Allergy status to sulfonamides status: Secondary | ICD-10-CM | POA: Insufficient documentation

## 2017-03-15 DIAGNOSIS — Z87442 Personal history of urinary calculi: Secondary | ICD-10-CM | POA: Diagnosis not present

## 2017-03-15 DIAGNOSIS — Z825 Family history of asthma and other chronic lower respiratory diseases: Secondary | ICD-10-CM | POA: Diagnosis not present

## 2017-03-15 DIAGNOSIS — Z79899 Other long term (current) drug therapy: Secondary | ICD-10-CM | POA: Insufficient documentation

## 2017-03-15 LAB — ECHOCARDIOGRAM COMPLETE: WEIGHTICAEL: 3036.8 [oz_av]

## 2017-03-15 NOTE — Addendum Note (Signed)
Encounter addended by: Jolaine Artist, MD on: 03/15/2017  9:55 PM<BR>    Actions taken: Sign clinical note

## 2017-03-15 NOTE — Progress Notes (Addendum)
Patient ID: Savannah Howard, female   DOB: 09-03-1945, 71 y.o.   MRN: [id number]   Cardio-Oncology CLINIC NOTE  Oncologist/referring: Lindi Adie   HPI:  [name] is a 71 y/o woman with h/o HTN, obesity and left breast CA s/p TCH chemotherapy and left mastectomy referred by Dr. Lindi Adie for enrollment into the cardio-oncology program.   Breast cancer hisory as below:    Breast cancer of upper-outer quadrant of left female breast ([place])   12/07/2014 Initial Diagnosis    Left breast 2:00 position: Invasive ductal carcinoma grade 2, ER 5%, PR 0%, Ki-67 80%, HER-2 positive ratio 6.96      12/07/2014 Mammogram    Large left breast mass 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, 2 small benign nodules in the right breast stable since 2002, other consistent with normal intramammary lymph node left axilla ultrasound normal-sized lymph nodes      12/31/2014 - 04/22/2015 Neo-Adjuvant Chemotherapy    TCH Perjeta neoadjuvant chemotherapy 6 (decreased chemotherapy dosage with cycle 2 and discontinued Perjeta for diarrhea)       04/29/2015 Breast MRI    Significant interval decrease in size of the mass, continued skin retraction, 4.3 x 2 x 1.8 cm (previously 4.9 x 3.9 x 3.9 cm)      05/30/2015 Surgery    Left mastectomy: Path CR, 0/6 LN Negative      09/09/2015 -  Anti-estrogen oral therapy    Anastrozole 1 mg daily, Lapatinib added 09/09/15      07/09/2016 Imaging    Patient presented in the emergency department after a fall at home without help for 24 hours, CT head revealed 3.9 x 3.5 cm cystic mass anterior right temporal lobe with vasogenic edema, MRI revealed 4.9 cm mass      07/09/2016 - 07/16/2016 Hospital Admission    Admitted with brain metastases after a fall at home, rhabdomyolysis       07/13/2016 Surgery    Craniotomy by Dr.Ditty: Metastatic breast cancer ER 40%, PR 0%, HER-2 positive; postcraniotomy MRI showed residual enhancement along the anterior and superior  margin, stable 1.3 cm left parietal lobe met.      08/11/2016 Imaging    Interval progression of Rt temporal mets 3.2 cm, Lt parietal intra axial mets 1 cm size       08/13/2016 - 08/24/2016 Radiation Therapy    SRS Rt Temporal and Left Parietal lobes      11/12/2016 Relapse/Recurrence    Mixed interval changes in the right temporal lobe. Partial collapse of the resection cavity with improved enhancement posteriorly but increased enhancement anteriorly, decrease in the left parietal metastases 2 subcentimeter foci of nodular extra-axial enhancement right temporal and right parietal region      01/06/2017 Imaging    Progression: Right anterior temporal pole 14 mm (was 6 mm); right anterior temporal lobe 11 mm (was 5 mm) right posterior temporal due to movement 15 mm (was 6 mm) right parietal dural based 11 mm (was 6 mm) left parietal lobe stable 7 x 8 mm 18 x 29 mm thick and irregular enhancement right temporal lobe resection cavity      02/03/2017 -  Chemotherapy    Lapatinib and anastrozole        Initially completed chemo and Herceptin in 2016. Unfortunately found to have relapse with brain mets in 12/17. Underwent craniotomy for resection and XRT. Started on lapatanib but recently stopped due to diarrhea. Gets around at home with walker. Remains very weak. Denies CP, orthopnea, PND  or edema.    Echo 12/16: EF 60-65% Lat s' 10.3 GLS - 21.1% + aortic sclerosis (no stenosis) Echo 3/17: EF 60% Lat s' 10.7 GLS - 22.2%  Echo today: reviewed personally EF 55-60% Lat s' 10.0 cm/s GLS -19.4%   Past Medical History:  Diagnosis Date  . Allergy    rhinitis  . Anemia   . Anxiety   . Brain cancer ([place]) 06/2016   breast ca with mets to brain  . DDD (degenerative disc disease), lumbar dx'd in 1980's  . GERD (gastroesophageal reflux disease)   . Heart murmur   . History of blood transfusion 2016 X 3 (05/30/2015)  . Hypertension   . Kidney stone 1990's X 1    "passed it"  . Obesity   . Primary cancer of upper outer quadrant of left breast ([place]) dx'd 03/2015  . Vitamin B 12 deficiency   . Vitamin D deficiency     Current Outpatient Prescriptions  Medication Sig Dispense Refill  . acetaminophen (TYLENOL) 500 MG tablet Take 1,000 mg by mouth at bedtime as needed for mild pain or headache.     . anastrozole (ARIMIDEX) 1 MG tablet TAKE 1 TABLET (1 MG TOTAL) BY MOUTH DAILY. 90 tablet 2  . Cholecalciferol (VITAMIN D3) 2000 units capsule Take 1 capsule (2,000 Units total) by mouth daily. 100 capsule 3  . Cholecalciferol (VITAMIN D3) 50000 units CAPS Take 1 capsule by mouth once a week. 8 capsule 0  . cyanocobalamin (,VITAMIN B-12,) 1000 MCG/ML injection INJECT 1 ML SUBCUTANEOUSLY EVERY 30 DAYS 1 mL 3  . dexamethasone (DECADRON) 6 MG tablet Take 1 tablet (6 mg total) by mouth 2 (two) times daily. 60 tablet 0  . fluconazole (DIFLUCAN) 100 MG tablet Take 1 tablet (100 mg total) by mouth daily. Take 2 pills on first day 8 tablet 0  . lapatinib (TYKERB) 250 MG tablet Take 4 tablets (1,000 mg total) by mouth daily. 120 tablet 3  . levETIRAcetam (KEPPRA) 1000 MG tablet Take 1 tablet (1,000 mg total) by mouth 2 (two) times daily. 60 tablet 1  . LORazepam (ATIVAN) 0.5 MG tablet TAKE 1 TABLET BY MOUTH 3 TIMES A DAY AS NEEDED FOR ANXIETY 270 tablet 0  . losartan-hydrochlorothiazide (HYZAAR) 100-25 MG tablet TAKE 1 TABLET BY MOUTH EVERY DAY 90 tablet 0  . magnesium gluconate (MAGONATE) 500 MG tablet Take 1 tablet (500 mg total) by mouth 2 (two) times daily. 60 tablet 11  . metoprolol succinate (TOPROL-XL) 50 MG 24 hr tablet TAKE 1/2 TABLET (25 MG TOTAL) BY MOUTH EVERY DAY 15 tablet 1  . omeprazole (PRILOSEC) 20 MG capsule Take 20 mg by mouth daily.    . potassium chloride (MICRO-K) 10 MEQ CR capsule TAKE ONE CAPSULE BY MOUTH EVERY DAY 90 capsule 0  . terazosin (HYTRIN) 5 MG capsule Take 5 mg by mouth at bedtime. Do not crush    . vitamin B-12 (CYANOCOBALAMIN) 1000  MCG tablet Take 1,000 mcg by mouth daily.     No current facility-administered medications for this encounter.     Allergies  Allergen Reactions  . Penicillins Swelling and Other (See Comments)    Genital swelling/skin peeled/yeast infection Has patient had a PCN reaction causing immediate rash, facial/tongue/throat swelling, SOB or lightheadedness with hypotension: Yes Has patient had a PCN reaction causing severe rash involving mucus membranes or skin necrosis: Unknown Has patient had a PCN reaction that required hospitalization: Unknown Has patient had a PCN reaction occurring within the last 10  years: Unknown If all of the above answers are "NO", then may proceed with   . Sulfonamide Derivatives Other (See Comments)    Convulsions        Social History   Social History  . Marital status: Single    Spouse name: N/A  . Number of children: N/A  . Years of education: N/A   Occupational History  . retired Energy manager   Social History Main Topics  . Smoking status: Never Smoker  . Smokeless tobacco: Never Used  . Alcohol use Yes     Comment: 05/30/2015 "I might take a drink of wine 1-2 times/yr"  . Drug use: No  . Sexual activity: Not Currently   Other Topics Concern  . Not on file   Social History Narrative   Regular exercise- no      Family History  Problem Relation Age of Onset  . Heart disease Mother        CAD  . COPD Mother   . Heart disease Father 3       leaky valve  . Parkinsonism Father   . Hypertension Other     Vitals:   03/15/17 1001  BP: 118/70  Pulse: 68  SpO2: 93%  Weight: 189 lb 12.8 oz (86.1 kg)    PHYSICAL EXAM: General:  Weak appearing. No respiratory difficulty HEENT: plethoric facies otherwise normal Cor: PMI nondisplaced. Regular rate & rhythm. No rubs, gallops. 2/6 RUSB Lungs: clear Abdomen: soft, nontender, nondistended. No hepatosplenomegaly. No bruits or masses. Good bowel sounds. Extremities: no cyanosis, clubbing,  rash, edema Neuro: alert & orientedx3, cranial nerves grossly intact. moves all 4 extremities w/o difficulty. Affect pleasant + tremo   ASSESSMENT & PLAN: 1. Breast CA with brain mets 2. HTN  3. Obesity  She completed Herceptin in 2106 and subsequently found to have brain mets in 12/17. Was getting lapatanib but stopped due to diarrhea. Considering restarting.   Echo today reviewed personally. EF and Doppler parameters stable. Ok to proceed with lapatanib from our standpoint. We discussed low risk of cardiotoxicity (1.6% in published studies).   BP well controlled.   She will f/u in 9 months with [place].   Lyn Deemer,MD 10:28 AM

## 2017-03-15 NOTE — Patient Instructions (Signed)
Follow up with Echo in 9 Months, we will call you to schedule.

## 2017-03-15 NOTE — Progress Notes (Signed)
  Echocardiogram 2D Echocardiogram has been performed.  Crisol Muecke T Zuley Lutter 03/15/2017, 10:13 AM

## 2017-03-16 ENCOUNTER — Other Ambulatory Visit: Payer: Self-pay | Admitting: Internal Medicine

## 2017-03-16 ENCOUNTER — Telehealth: Payer: Self-pay | Admitting: Hematology and Oncology

## 2017-03-16 NOTE — Telephone Encounter (Signed)
sw pcaregiver to confirm 8/10 appt at 0830 per sch msg

## 2017-03-16 NOTE — Telephone Encounter (Signed)
Received call from Antares (Friend), Jeannie, to confirm that pt will be coming in on Friday to see Dr.Gudena and discuss pt symptoms from lapatinib (diarrhea).Pt stopped taking lapatinib since Friday, per Dr advise. [name] would like to let MD know that pt diarrhea resolved last Saturday. Pt took imodium and it stopped. [name] is concerned that pt is not taking her scheduled medications properly as directed. Pt has a pill box, but also had bottles of medication that has not been filled for a few months.   She is also concerned that the pt is not getting out of bed and is getting weaker at home.  Pt currently lives in a motel at this time and has stuff piled up in her room. Physical therapy ordered for pt but she will not answer the door when they come. When they are working with pt, there is barely any room to do therapy due to all the stuff piling up in her room.   Pt POA will not be present during pt appt with Dr.Gudena. Will discuss home health and social services for pt. [name] would like to make sure that Dr.Gudena review her medications properly. Will update POA accordingly.

## 2017-03-17 DIAGNOSIS — C7931 Secondary malignant neoplasm of brain: Secondary | ICD-10-CM | POA: Diagnosis not present

## 2017-03-17 DIAGNOSIS — N182 Chronic kidney disease, stage 2 (mild): Secondary | ICD-10-CM | POA: Diagnosis not present

## 2017-03-17 DIAGNOSIS — G40909 Epilepsy, unspecified, not intractable, without status epilepticus: Secondary | ICD-10-CM | POA: Diagnosis not present

## 2017-03-17 DIAGNOSIS — I129 Hypertensive chronic kidney disease with stage 1 through stage 4 chronic kidney disease, or unspecified chronic kidney disease: Secondary | ICD-10-CM | POA: Diagnosis not present

## 2017-03-17 DIAGNOSIS — F419 Anxiety disorder, unspecified: Secondary | ICD-10-CM | POA: Diagnosis not present

## 2017-03-17 DIAGNOSIS — C50412 Malignant neoplasm of upper-outer quadrant of left female breast: Secondary | ICD-10-CM | POA: Diagnosis not present

## 2017-03-17 NOTE — Telephone Encounter (Signed)
They never received because the rx is still pending waiting on MD approval.../lmb

## 2017-03-17 NOTE — Telephone Encounter (Signed)
Pharmacy called and never recieved this please resend,  Same pharmacy

## 2017-03-19 ENCOUNTER — Ambulatory Visit (HOSPITAL_BASED_OUTPATIENT_CLINIC_OR_DEPARTMENT_OTHER): Payer: Medicare Other

## 2017-03-19 ENCOUNTER — Ambulatory Visit (HOSPITAL_BASED_OUTPATIENT_CLINIC_OR_DEPARTMENT_OTHER): Payer: Medicare Other | Admitting: Hematology and Oncology

## 2017-03-19 ENCOUNTER — Encounter: Payer: Self-pay | Admitting: Hematology and Oncology

## 2017-03-19 ENCOUNTER — Other Ambulatory Visit: Payer: Self-pay | Admitting: Hematology and Oncology

## 2017-03-19 DIAGNOSIS — E876 Hypokalemia: Secondary | ICD-10-CM

## 2017-03-19 DIAGNOSIS — Z170 Estrogen receptor positive status [ER+]: Secondary | ICD-10-CM

## 2017-03-19 DIAGNOSIS — I129 Hypertensive chronic kidney disease with stage 1 through stage 4 chronic kidney disease, or unspecified chronic kidney disease: Secondary | ICD-10-CM | POA: Diagnosis not present

## 2017-03-19 DIAGNOSIS — Z79811 Long term (current) use of aromatase inhibitors: Secondary | ICD-10-CM

## 2017-03-19 DIAGNOSIS — C7931 Secondary malignant neoplasm of brain: Secondary | ICD-10-CM | POA: Diagnosis not present

## 2017-03-19 DIAGNOSIS — C7951 Secondary malignant neoplasm of bone: Secondary | ICD-10-CM | POA: Diagnosis not present

## 2017-03-19 DIAGNOSIS — C50412 Malignant neoplasm of upper-outer quadrant of left female breast: Secondary | ICD-10-CM

## 2017-03-19 DIAGNOSIS — N182 Chronic kidney disease, stage 2 (mild): Secondary | ICD-10-CM | POA: Diagnosis not present

## 2017-03-19 DIAGNOSIS — G40909 Epilepsy, unspecified, not intractable, without status epilepticus: Secondary | ICD-10-CM | POA: Diagnosis not present

## 2017-03-19 DIAGNOSIS — F419 Anxiety disorder, unspecified: Secondary | ICD-10-CM | POA: Diagnosis not present

## 2017-03-19 LAB — COMPREHENSIVE METABOLIC PANEL
ALT: 14 U/L (ref 0–55)
ANION GAP: 13 meq/L — AB (ref 3–11)
AST: 8 U/L (ref 5–34)
Albumin: 2.7 g/dL — ABNORMAL LOW (ref 3.5–5.0)
Alkaline Phosphatase: 68 U/L (ref 40–150)
BILIRUBIN TOTAL: 0.3 mg/dL (ref 0.20–1.20)
BUN: 21.1 mg/dL (ref 7.0–26.0)
CHLORIDE: 105 meq/L (ref 98–109)
CO2: 24 meq/L (ref 22–29)
CREATININE: 0.8 mg/dL (ref 0.6–1.1)
Calcium: 9.8 mg/dL (ref 8.4–10.4)
EGFR: 72 mL/min/{1.73_m2} — ABNORMAL LOW (ref >90)
GLUCOSE: 90 mg/dL (ref 70–140)
Potassium: 3.4 mEq/L — ABNORMAL LOW (ref 3.5–5.1)
Sodium: 142 mEq/L (ref 136–145)
Total Protein: 6.6 g/dL (ref 6.4–8.3)

## 2017-03-19 LAB — CBC WITH DIFFERENTIAL/PLATELET
BASO%: 0.8 % (ref 0.0–2.0)
Basophils Absolute: 0.1 10*3/uL (ref 0.0–0.1)
EOS ABS: 0.1 10*3/uL (ref 0.0–0.5)
EOS%: 0.4 % (ref 0.0–7.0)
HEMATOCRIT: 34.2 % — AB (ref 34.8–46.6)
HEMOGLOBIN: 11.8 g/dL (ref 11.6–15.9)
LYMPH#: 5.5 10*3/uL — AB (ref 0.9–3.3)
LYMPH%: 42.9 % (ref 14.0–49.7)
MCH: 33.4 pg (ref 25.1–34.0)
MCHC: 34.4 g/dL (ref 31.5–36.0)
MCV: 97 fL (ref 79.5–101.0)
MONO#: 1.4 10*3/uL — ABNORMAL HIGH (ref 0.1–0.9)
MONO%: 11 % (ref 0.0–14.0)
NEUT%: 44.9 % (ref 38.4–76.8)
NEUTROS ABS: 5.8 10*3/uL (ref 1.5–6.5)
Platelets: 405 10*3/uL — ABNORMAL HIGH (ref 145–400)
RBC: 3.52 10*6/uL — ABNORMAL LOW (ref 3.70–5.45)
RDW: 15.4 % — AB (ref 11.2–14.5)
WBC: 12.8 10*3/uL — AB (ref 3.9–10.3)

## 2017-03-19 LAB — TECHNOLOGIST REVIEW

## 2017-03-19 NOTE — Progress Notes (Signed)
Patient Care Team: [name], [name], MD as PCP - General  DIAGNOSIS:  Encounter Diagnosis  Name Primary?  . Malignant neoplasm of upper-outer quadrant of left breast in female, estrogen receptor positive ([place])     SUMMARY OF ONCOLOGIC HISTORY:   Breast cancer of upper-outer quadrant of left female breast ([place])   12/07/2014 Initial Diagnosis    Left breast 2:00 position: Invasive ductal carcinoma grade 2, ER 5%, PR 0%, Ki-67 80%, HER-2 positive ratio 6.96      12/07/2014 Mammogram    Large left breast mass 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, 2 small benign nodules in the right breast stable since 2002, other consistent with normal intramammary lymph node left axilla ultrasound normal-sized lymph nodes      12/31/2014 - 04/22/2015 Neo-Adjuvant Chemotherapy    TCH Perjeta neoadjuvant chemotherapy 6 (decreased chemotherapy dosage with cycle 2 and discontinued Perjeta for diarrhea)       04/29/2015 Breast MRI    Significant interval decrease in size of the mass, continued skin retraction, 4.3 x 2 x 1.8 cm (previously 4.9 x 3.9 x 3.9 cm)      05/30/2015 Surgery    Left mastectomy: Path CR, 0/6 LN Negative      09/09/2015 -  Anti-estrogen oral therapy    Anastrozole 1 mg daily, Lapatinib added 09/09/15      07/09/2016 Imaging    Patient presented in the emergency department after a fall at home without help for 24 hours, CT head revealed 3.9 x 3.5 cm cystic mass anterior right temporal lobe with vasogenic edema, MRI revealed 4.9 cm mass      07/09/2016 - 07/16/2016 Hospital Admission    Admitted with brain metastases after a fall at home, rhabdomyolysis       07/13/2016 Surgery    Craniotomy by Dr.Ditty: Metastatic breast cancer ER 40%, PR 0%, HER-2 positive; postcraniotomy MRI showed residual enhancement along the anterior and superior margin, stable 1.3 cm left parietal lobe met.      08/11/2016 Imaging    Interval progression of Rt temporal mets 3.2 cm, Lt parietal intra axial mets 1 cm  size       08/13/2016 - 08/24/2016 Radiation Therapy    SRS Rt Temporal and Left Parietal lobes      11/12/2016 Relapse/Recurrence    Mixed interval changes in the right temporal lobe. Partial collapse of the resection cavity with improved enhancement posteriorly but increased enhancement anteriorly, decrease in the left parietal metastases 2 subcentimeter foci of nodular extra-axial enhancement right temporal and right parietal region      01/06/2017 Imaging    Progression: Right anterior temporal pole 14 mm (was 6 mm); right anterior temporal lobe 11 mm (was 5 mm) right posterior temporal due to movement 15 mm (was 6 mm) right parietal dural based 11 mm (was 6 mm) left parietal lobe stable 7 x 8 mm 18 x 29 mm thick and irregular enhancement right temporal lobe resection cavity      02/03/2017 -  Chemotherapy    Lapatinib and anastrozole       CHIEF COMPLIANT: Follow-up on lapatinib and anastrozole  INTERVAL HISTORY: Savannah Howard is a 71 year old with above-mentioned history of metastatic breast cancer with brain metastases who is currently on lapatinib and anastrozole. She is scheduled to have a brain MRI soon. She could not tolerate lapatinib with severe diarrhea. We had to stop her lapatinib and it appears that the diarrhea has subsided. She was severely hypokalemic and  required oral potassium replacement. She is still working with physical therapy at home to get stronger. She states that she has trouble sleeping at night. Otherwise she is taking Ativan in the morning and afternoon as well and taking naps at those times. I encouraged her to stop taking Ativan in the morning and afternoon and stay more active and exercise more throughout the day.  REVIEW OF SYSTEMS:   Constitutional: Denies fevers, chills or abnormal weight loss Eyes: Denies blurriness of vision Ears, nose, mouth, throat, and face: Denies mucositis or sore throat, facial puffiness Respiratory: Denies cough, dyspnea or  wheezes Cardiovascular: Denies palpitation, chest discomfort Gastrointestinal:  Denies nausea, heartburn or change in bowel habits Skin: Denies abnormal skin rashes Lymphatics: Denies new lymphadenopathy or easy bruising Neurological: Right arm weakness Behavioral/Psych: Mood is stable, no new changes  Extremities: No lower extremity edema All other systems were reviewed with the patient and are negative.  I have reviewed the past medical history, past surgical history, social history and family history with the patient and they are unchanged from previous note.  ALLERGIES:  is allergic to penicillins and sulfonamide derivatives.  MEDICATIONS:  Current Outpatient Prescriptions  Medication Sig Dispense Refill  . acetaminophen (TYLENOL) 500 MG tablet Take 1,000 mg by mouth at bedtime as needed for mild pain or headache.     . anastrozole (ARIMIDEX) 1 MG tablet TAKE 1 TABLET (1 MG TOTAL) BY MOUTH DAILY. 90 tablet 2  . Cholecalciferol (VITAMIN D3) 2000 units capsule Take 1 capsule (2,000 Units total) by mouth daily. 100 capsule 3  . cyanocobalamin (,VITAMIN B-12,) 1000 MCG/ML injection INJECT 1 ML SUBCUTANEOUSLY EVERY 30 DAYS 1 mL 3  . D3-50 50000 units capsule TAKE 1 CAPSULE BY MOUTH ONCE WEEKLY 8 capsule 0  . dexamethasone (DECADRON) 6 MG tablet Take 1 tablet (6 mg total) by mouth 2 (two) times daily. 60 tablet 0  . fluconazole (DIFLUCAN) 100 MG tablet Take 1 tablet (100 mg total) by mouth daily. Take 2 pills on first day 8 tablet 0  . lapatinib (TYKERB) 250 MG tablet Take 4 tablets (1,000 mg total) by mouth daily. 120 tablet 3  . levETIRAcetam (KEPPRA) 1000 MG tablet Take 1 tablet (1,000 mg total) by mouth 2 (two) times daily. 60 tablet 1  . LORazepam (ATIVAN) 0.5 MG tablet TAKE 1 TABLET BY MOUTH 3 TIMES A DAY AS NEEDED FOR ANXIETY 90 tablet 1  . losartan-hydrochlorothiazide (HYZAAR) 100-25 MG tablet TAKE 1 TABLET BY MOUTH EVERY DAY 90 tablet 2  . magnesium gluconate (MAGONATE) 500 MG  tablet Take 1 tablet (500 mg total) by mouth 2 (two) times daily. 60 tablet 11  . metoprolol succinate (TOPROL-XL) 50 MG 24 hr tablet TAKE 1/2 TABLET (25 MG TOTAL) BY MOUTH EVERY DAY 15 tablet 1  . omeprazole (PRILOSEC) 20 MG capsule Take 20 mg by mouth daily.    . potassium chloride (MICRO-K) 10 MEQ CR capsule TAKE 1 CAPSULE BY MOUTH EVERY DAY 90 capsule 2  . terazosin (HYTRIN) 5 MG capsule Take 5 mg by mouth at bedtime. Do not crush    . vitamin B-12 (CYANOCOBALAMIN) 1000 MCG tablet Take 1,000 mcg by mouth daily.     No current facility-administered medications for this visit.     PHYSICAL EXAMINATION: ECOG PERFORMANCE STATUS: 2 - Symptomatic, <50% confined to bed  Vitals:   03/19/17 0825  BP: 133/70  Pulse: 80  Resp: 18  Temp: 98.2 F (36.8 C)  SpO2: 98%   Filed Weights  03/19/17 0825  Weight: 193 lb 11.2 oz (87.9 kg)    GENERAL:alert, no distress and comfortable SKIN: skin color, texture, turgor are normal, no rashes or significant lesions EYES: normal, Conjunctiva are pink and non-injected, sclera clear OROPHARYNX:no exudate, no erythema and lips, buccal mucosa, and tongue normal  NECK: supple, thyroid normal size, non-tender, without nodularity LYMPH:  no palpable lymphadenopathy in the cervical, axillary or inguinal LUNGS: clear to auscultation and percussion with normal breathing effort HEART: regular rate & rhythm and no murmurs and no lower extremity edema ABDOMEN:abdomen soft, non-tender and normal bowel sounds MUSCULOSKELETAL:no cyanosis of digits and no clubbing  NEURO: alert & oriented x 3 with fluent speech, right arm weakness EXTREMITIES: No lower extremity edema  LABORATORY DATA:  I have reviewed the data as listed   Chemistry      Component Value Date/Time   NA 140 03/03/2017 0858   K 3.0 (LL) 03/03/2017 0858   CL 105 02/03/2017 0942   CO2 20 (L) 03/03/2017 0858   BUN 31.5 (H) 03/03/2017 0858   CREATININE 1.1 03/03/2017 0858   GLU 115 07/20/2016        Component Value Date/Time   CALCIUM 9.1 03/03/2017 0858   ALKPHOS 70 03/03/2017 0858   AST 9 03/03/2017 0858   ALT 18 03/03/2017 0858   BILITOT 0.82 03/03/2017 0858       Lab Results  Component Value Date   WBC 9.5 03/03/2017   HGB 12.7 03/03/2017   HCT 38.0 03/03/2017   MCV 97.7 03/03/2017   PLT 201 03/03/2017   NEUTROABS 4.0 03/03/2017    ASSESSMENT & PLAN:  Breast cancer of upper-outer quadrant of left female breast ([place]) 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 TCH-Perjeta 6 cycles started 12/31/2014 completed 04/22/2015 (decreased chemotherapy dosage with cycle 2 and discontinued Perjeta for diarrhea) 2. Left mastectomy 05/30/2015: CR to chemo, 0/6 LN  Did not need radiation based upon tumor board recommendation 3. Herceptin maintenance completed May 2017 4. Brain metastases diagnosed November 2017  Treatment plan:  1. Stereotactic radiosurgery by Dr. Tammi Klippel 08/13/16 to 08/24/16 2. Anastrozole started 09/08/2016  3. SRS to recurrent disease 01/21/2017  01/06/2017: Progression: Right anterior temporal lobe14 mm (was 6 mm); right anterior temporal lobe 11 mm (was 5 mm) right posterior temporal due to movement 15 mm (was 6 mm) right parietal dural based 11 mm (was 6 mm) left parietal lobe stable 7 x 8 mm 18 x 29 mm thick and irregular enhancement right temporal lobe resection cavity  Current treatment: Lapatinib 1000 mg by mouth daily along with anastrozole 1 mg daily started 02/03/2017, stopped 03/10/2017 reduced dosage to 750 mg starting 03/19/2017   Lapatinib toxicities: Severe diarrhea which led to discontinuation of therapy and we reduced the dosage to 750 mg daily. Hypokalemia: Patient is on 40 mEq of potassium orally daily. I would like her to get blood work today. We can call her with  the results so that we can adjust the potassium dosage. Return to clinic in one month with labs and follow-up   I spent 25 minutes talking to the patient of which more than half was spent in counseling and coordination of care.  No orders of the defined types were placed in this encounter.  The patient has a good understanding of  the overall plan. she agrees with it. she will call with any problems that may develop before the next visit here.   Rulon Eisenmenger, MD 03/19/17

## 2017-03-19 NOTE — Assessment & Plan Note (Signed)
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 TCH-Perjeta 6 cycles started 12/31/2014 completed 04/22/2015 (decreased chemotherapy dosage with cycle 2 and discontinued Perjeta for diarrhea) 2. Left mastectomy 05/30/2015: CR to chemo, 0/6 LN  Did not need radiation based upon tumor board recommendation 3. Herceptin maintenance completed May 2017 4. Brain metastases diagnosed November 2017  Treatment plan:  1. Stereotactic radiosurgery by Dr. Tammi Klippel 08/13/16 to 08/24/16 2. Anastrozole started 09/08/2016  3. SRS to recurrent disease 01/21/2017  01/06/2017: Progression: Right anterior temporal lobe14 mm (was 6 mm); right anterior temporal lobe 11 mm (was 5 mm) right posterior temporal due to movement 15 mm (was 6 mm) right parietal dural based 11 mm (was 6 mm) left parietal lobe stable 7 x 8 mm 18 x 29 mm thick and irregular enhancement right temporal lobe resection cavity  Current treatment: Lapatinib 1000 mg by mouth daily along with anastrozole 1 mg daily started 02/03/2017   Lapatinib toxicities:  Return to clinic in 2 months after scans

## 2017-03-19 NOTE — Telephone Encounter (Signed)
RX was approved on 03/15/17 by Dr. Alain Marion

## 2017-03-21 ENCOUNTER — Ambulatory Visit
Admission: RE | Admit: 2017-03-21 | Discharge: 2017-03-21 | Disposition: A | Discharge status: Home / Self Care | Payer: Medicare Other | Source: Ambulatory Visit | Attending: Neurological Surgery | Admitting: Neurological Surgery

## 2017-03-21 DIAGNOSIS — R51 Headache: Secondary | ICD-10-CM | POA: Diagnosis not present

## 2017-03-21 DIAGNOSIS — C7931 Secondary malignant neoplasm of brain: Secondary | ICD-10-CM

## 2017-03-21 MED ORDER — GADOBENATE DIMEGLUMINE 529 MG/ML IV SOLN
18.0000 mL | Freq: Once | INTRAVENOUS | Status: AC | PRN
  Administered 2017-03-21: 18 mL via INTRAVENOUS

## 2017-03-22 DIAGNOSIS — I129 Hypertensive chronic kidney disease with stage 1 through stage 4 chronic kidney disease, or unspecified chronic kidney disease: Secondary | ICD-10-CM | POA: Diagnosis not present

## 2017-03-22 DIAGNOSIS — C7931 Secondary malignant neoplasm of brain: Secondary | ICD-10-CM | POA: Diagnosis not present

## 2017-03-22 DIAGNOSIS — C50412 Malignant neoplasm of upper-outer quadrant of left female breast: Secondary | ICD-10-CM | POA: Diagnosis not present

## 2017-03-22 DIAGNOSIS — G40909 Epilepsy, unspecified, not intractable, without status epilepticus: Secondary | ICD-10-CM | POA: Diagnosis not present

## 2017-03-22 DIAGNOSIS — N182 Chronic kidney disease, stage 2 (mild): Secondary | ICD-10-CM | POA: Diagnosis not present

## 2017-03-22 DIAGNOSIS — F419 Anxiety disorder, unspecified: Secondary | ICD-10-CM | POA: Diagnosis not present

## 2017-03-24 ENCOUNTER — Telehealth: Payer: Self-pay

## 2017-03-24 DIAGNOSIS — G40909 Epilepsy, unspecified, not intractable, without status epilepticus: Secondary | ICD-10-CM | POA: Diagnosis not present

## 2017-03-24 DIAGNOSIS — N182 Chronic kidney disease, stage 2 (mild): Secondary | ICD-10-CM | POA: Diagnosis not present

## 2017-03-24 DIAGNOSIS — F419 Anxiety disorder, unspecified: Secondary | ICD-10-CM | POA: Diagnosis not present

## 2017-03-24 DIAGNOSIS — C7931 Secondary malignant neoplasm of brain: Secondary | ICD-10-CM | POA: Diagnosis not present

## 2017-03-24 DIAGNOSIS — I129 Hypertensive chronic kidney disease with stage 1 through stage 4 chronic kidney disease, or unspecified chronic kidney disease: Secondary | ICD-10-CM | POA: Diagnosis not present

## 2017-03-24 DIAGNOSIS — C50412 Malignant neoplasm of upper-outer quadrant of left female breast: Secondary | ICD-10-CM | POA: Diagnosis not present

## 2017-03-24 MED ORDER — LAPATINIB DITOSYLATE 250 MG PO TABS
750.0000 mg | ORAL_TABLET | Freq: Every day | ORAL | 3 refills | Status: DC
Start: 2017-03-24 — End: 2017-04-20

## 2017-03-24 NOTE — Telephone Encounter (Signed)
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 Per Dr Savannah Howard OV note 03/19/17 the dose should be 750 mg daily. New rx sent to pharmacy with reduced dose.

## 2017-03-29 ENCOUNTER — Other Ambulatory Visit: Payer: Self-pay | Admitting: Internal Medicine

## 2017-03-29 DIAGNOSIS — E669 Obesity, unspecified: Secondary | ICD-10-CM | POA: Diagnosis not present

## 2017-03-29 DIAGNOSIS — E785 Hyperlipidemia, unspecified: Secondary | ICD-10-CM | POA: Diagnosis not present

## 2017-03-29 DIAGNOSIS — I129 Hypertensive chronic kidney disease with stage 1 through stage 4 chronic kidney disease, or unspecified chronic kidney disease: Secondary | ICD-10-CM | POA: Diagnosis not present

## 2017-03-29 DIAGNOSIS — F419 Anxiety disorder, unspecified: Secondary | ICD-10-CM | POA: Diagnosis not present

## 2017-03-29 DIAGNOSIS — D631 Anemia in chronic kidney disease: Secondary | ICD-10-CM | POA: Diagnosis not present

## 2017-03-29 DIAGNOSIS — M519 Unspecified thoracic, thoracolumbar and lumbosacral intervertebral disc disorder: Secondary | ICD-10-CM | POA: Diagnosis not present

## 2017-03-29 DIAGNOSIS — C7931 Secondary malignant neoplasm of brain: Secondary | ICD-10-CM | POA: Diagnosis not present

## 2017-03-29 DIAGNOSIS — G40909 Epilepsy, unspecified, not intractable, without status epilepticus: Secondary | ICD-10-CM | POA: Diagnosis not present

## 2017-03-29 DIAGNOSIS — K219 Gastro-esophageal reflux disease without esophagitis: Secondary | ICD-10-CM | POA: Diagnosis not present

## 2017-03-29 DIAGNOSIS — C50412 Malignant neoplasm of upper-outer quadrant of left female breast: Secondary | ICD-10-CM | POA: Diagnosis not present

## 2017-03-29 DIAGNOSIS — N182 Chronic kidney disease, stage 2 (mild): Secondary | ICD-10-CM | POA: Diagnosis not present

## 2017-03-29 MED ORDER — METOPROLOL SUCCINATE ER 50 MG PO TB24: ORAL_TABLET | ORAL | 3 refills | Status: AC

## 2017-03-29 NOTE — Telephone Encounter (Signed)
Called [facility] to verify  If rx was received for the Lorazepam. Spoke w/alisia she confirm rx was received and filled on 8/10. She also stated they was waiting on refill for pt Metoprolol. Inform Rona Ravens will approved and send back electronically...Johny Chess

## 2017-03-29 NOTE — Addendum Note (Signed)
Addended by: Earnstine Regal on: 03/29/2017 10:34 AM   Modules accepted: Orders

## 2017-03-30 ENCOUNTER — Other Ambulatory Visit: Payer: Self-pay | Admitting: Internal Medicine

## 2017-03-31 ENCOUNTER — Ambulatory Visit: Payer: Medicare Other | Admitting: Hematology and Oncology

## 2017-03-31 ENCOUNTER — Other Ambulatory Visit: Payer: Medicare Other

## 2017-03-31 DIAGNOSIS — G40909 Epilepsy, unspecified, not intractable, without status epilepticus: Secondary | ICD-10-CM | POA: Diagnosis not present

## 2017-03-31 DIAGNOSIS — C7931 Secondary malignant neoplasm of brain: Secondary | ICD-10-CM | POA: Diagnosis not present

## 2017-03-31 DIAGNOSIS — N182 Chronic kidney disease, stage 2 (mild): Secondary | ICD-10-CM | POA: Diagnosis not present

## 2017-03-31 DIAGNOSIS — C50412 Malignant neoplasm of upper-outer quadrant of left female breast: Secondary | ICD-10-CM | POA: Diagnosis not present

## 2017-03-31 DIAGNOSIS — I129 Hypertensive chronic kidney disease with stage 1 through stage 4 chronic kidney disease, or unspecified chronic kidney disease: Secondary | ICD-10-CM | POA: Diagnosis not present

## 2017-03-31 DIAGNOSIS — F419 Anxiety disorder, unspecified: Secondary | ICD-10-CM | POA: Diagnosis not present

## 2017-04-02 ENCOUNTER — Ambulatory Visit (HOSPITAL_BASED_OUTPATIENT_CLINIC_OR_DEPARTMENT_OTHER): Payer: Medicare Other | Admitting: Adult Health

## 2017-04-02 ENCOUNTER — Ambulatory Visit (HOSPITAL_BASED_OUTPATIENT_CLINIC_OR_DEPARTMENT_OTHER): Payer: Medicare Other

## 2017-04-02 ENCOUNTER — Encounter: Payer: Self-pay | Admitting: Emergency Medicine

## 2017-04-02 ENCOUNTER — Other Ambulatory Visit (HOSPITAL_BASED_OUTPATIENT_CLINIC_OR_DEPARTMENT_OTHER): Payer: Medicare Other

## 2017-04-02 ENCOUNTER — Telehealth: Payer: Self-pay | Admitting: *Deleted

## 2017-04-02 VITALS — BP 121/64 | HR 64 | Temp 98.6°F | Resp 18

## 2017-04-02 VITALS — BP 128/62 | HR 84 | Temp 97.5°F | Resp 18 | Ht 63.0 in | Wt 185.7 lb

## 2017-04-02 DIAGNOSIS — C7931 Secondary malignant neoplasm of brain: Secondary | ICD-10-CM

## 2017-04-02 DIAGNOSIS — R112 Nausea with vomiting, unspecified: Secondary | ICD-10-CM | POA: Diagnosis not present

## 2017-04-02 DIAGNOSIS — F419 Anxiety disorder, unspecified: Secondary | ICD-10-CM | POA: Diagnosis not present

## 2017-04-02 DIAGNOSIS — R5383 Other fatigue: Secondary | ICD-10-CM | POA: Diagnosis not present

## 2017-04-02 DIAGNOSIS — I129 Hypertensive chronic kidney disease with stage 1 through stage 4 chronic kidney disease, or unspecified chronic kidney disease: Secondary | ICD-10-CM | POA: Diagnosis not present

## 2017-04-02 DIAGNOSIS — R209 Unspecified disturbances of skin sensation: Secondary | ICD-10-CM

## 2017-04-02 DIAGNOSIS — K521 Toxic gastroenteritis and colitis: Secondary | ICD-10-CM

## 2017-04-02 DIAGNOSIS — T451X5A Adverse effect of antineoplastic and immunosuppressive drugs, initial encounter: Secondary | ICD-10-CM

## 2017-04-02 DIAGNOSIS — Z170 Estrogen receptor positive status [ER+]: Principal | ICD-10-CM

## 2017-04-02 DIAGNOSIS — G40909 Epilepsy, unspecified, not intractable, without status epilepticus: Secondary | ICD-10-CM | POA: Diagnosis not present

## 2017-04-02 DIAGNOSIS — E860 Dehydration: Secondary | ICD-10-CM

## 2017-04-02 DIAGNOSIS — C50412 Malignant neoplasm of upper-outer quadrant of left female breast: Secondary | ICD-10-CM

## 2017-04-02 DIAGNOSIS — N182 Chronic kidney disease, stage 2 (mild): Secondary | ICD-10-CM | POA: Diagnosis not present

## 2017-04-02 DIAGNOSIS — C50911 Malignant neoplasm of unspecified site of right female breast: Secondary | ICD-10-CM

## 2017-04-02 DIAGNOSIS — E876 Hypokalemia: Secondary | ICD-10-CM

## 2017-04-02 DIAGNOSIS — C712 Malignant neoplasm of temporal lobe: Secondary | ICD-10-CM

## 2017-04-02 LAB — CBC WITH DIFFERENTIAL/PLATELET
BASO%: 1 % (ref 0.0–2.0)
Basophils Absolute: 0.1 10*3/uL (ref 0.0–0.1)
EOS ABS: 0 10*3/uL (ref 0.0–0.5)
EOS%: 0.2 % (ref 0.0–7.0)
HCT: 38.2 % (ref 34.8–46.6)
HEMOGLOBIN: 13.1 g/dL (ref 11.6–15.9)
LYMPH#: 2.9 10*3/uL (ref 0.9–3.3)
LYMPH%: 27.5 % (ref 14.0–49.7)
MCH: 34 pg (ref 25.1–34.0)
MCHC: 34.4 g/dL (ref 31.5–36.0)
MCV: 98.7 fL (ref 79.5–101.0)
MONO#: 0.9 10*3/uL (ref 0.1–0.9)
MONO%: 8.4 % (ref 0.0–14.0)
NEUT%: 62.9 % (ref 38.4–76.8)
NEUTROS ABS: 6.6 10*3/uL — AB (ref 1.5–6.5)
Platelets: 427 10*3/uL — ABNORMAL HIGH (ref 145–400)
RBC: 3.87 10*6/uL (ref 3.70–5.45)
RDW: 17 % — AB (ref 11.2–14.5)
WBC: 10.4 10*3/uL — AB (ref 3.9–10.3)

## 2017-04-02 LAB — COMPREHENSIVE METABOLIC PANEL
ALBUMIN: 3 g/dL — AB (ref 3.5–5.0)
ALK PHOS: 73 U/L (ref 40–150)
ALT: 8 U/L (ref 0–55)
AST: 13 U/L (ref 5–34)
Anion Gap: 22 mEq/L — ABNORMAL HIGH (ref 3–11)
BILIRUBIN TOTAL: 0.91 mg/dL (ref 0.20–1.20)
BUN: 18.7 mg/dL (ref 7.0–26.0)
CO2: 17 mEq/L — ABNORMAL LOW (ref 22–29)
Calcium: 9.9 mg/dL (ref 8.4–10.4)
Chloride: 95 mEq/L — ABNORMAL LOW (ref 98–109)
Creatinine: 1.1 mg/dL (ref 0.6–1.1)
EGFR: 50 mL/min/{1.73_m2} — AB (ref >90)
GLUCOSE: 103 mg/dL (ref 70–140)
Potassium: 3.2 mEq/L — ABNORMAL LOW (ref 3.5–5.1)
TOTAL PROTEIN: 6.8 g/dL (ref 6.4–8.3)

## 2017-04-02 MED ORDER — SODIUM CHLORIDE 0.9 % IV SOLN
Freq: Once | INTRAVENOUS | Status: AC
  Administered 2017-04-02: 14:00:00 via INTRAVENOUS

## 2017-04-02 MED ORDER — POTASSIUM CHLORIDE 20 MEQ/100ML IV SOLN: 20.0000 meq | Freq: Once | INTRAVENOUS | Status: DC

## 2017-04-02 MED ORDER — SODIUM CHLORIDE 0.9 % IV SOLN: Freq: Once | INTRAVENOUS | Status: DC

## 2017-04-02 MED ORDER — SODIUM CHLORIDE 0.9 % IV SOLN
Freq: Once | INTRAVENOUS | Status: DC
  Filled 2017-04-02: qty 100

## 2017-04-02 MED ORDER — ONDANSETRON HCL 8 MG PO TABS: 8.0000 mg | ORAL_TABLET | Freq: Three times a day (TID) | ORAL | 0 refills | Status: AC | PRN

## 2017-04-02 MED ORDER — SODIUM CHLORIDE 0.9 % IV SOLN
INTRAVENOUS | Status: DC
  Administered 2017-04-02: 15:00:00 via INTRAVENOUS
  Filled 2017-04-02 (×2): qty 1000

## 2017-04-02 MED ORDER — ONDANSETRON HCL 4 MG/2ML IJ SOLN
8.0000 mg | Freq: Once | INTRAMUSCULAR | Status: AC
  Administered 2017-04-02: 8 mg via INTRAVENOUS

## 2017-04-02 MED ORDER — SODIUM CHLORIDE 0.9 % IJ SOLN
10.0000 mL | INTRAMUSCULAR | Status: DC | PRN
  Administered 2017-04-02: 10 mL
  Filled 2017-04-02: qty 10

## 2017-04-02 MED ORDER — HEPARIN SOD (PORK) LOCK FLUSH 100 UNIT/ML IV SOLN
500.0000 [IU] | Freq: Once | INTRAVENOUS | Status: AC | PRN
  Administered 2017-04-02: 500 [IU]
  Filled 2017-04-02: qty 5

## 2017-04-02 MED ORDER — ONDANSETRON HCL 4 MG/2ML IJ SOLN
INTRAMUSCULAR | Status: AC
  Filled 2017-04-02: qty 4

## 2017-04-02 MED ORDER — SODIUM CHLORIDE 0.9 % IV SOLN: 8.0000 mg | Freq: Once | INTRAVENOUS | Status: DC

## 2017-04-02 NOTE — Telephone Encounter (Signed)
Received call from pt stating "Savannah Howard not been able to keep anything down since Monday". Pt then proceeded to inform she has been able to keep down fluids but not food. Spoke to home health PT. Informed pt is weak and does not feel she needs to go over the weekend without being assessed.  Discussed with Dr. Lindi Adie, pt to be seen today by Mendel Ryder. Called pt and gave appt date and time at 1pm for labs followed by appt with Mendel Ryder. Confirmed appt.

## 2017-04-02 NOTE — Patient Instructions (Signed)

## 2017-04-03 ENCOUNTER — Encounter: Payer: Self-pay | Admitting: Adult Health

## 2017-04-03 NOTE — Assessment & Plan Note (Signed)
Savannah Howard is here for nausea and vomiting x 4 days.  When she came into clinic she was vomiting.  She received one liter of normal saline and potassium 49meq IV.  She also received 8mg  of IV Zofran while in clinic which settled her nausea.  I sent in a prescription of Zofran for her nausea and vomiting and reviewed with her how to take it.  She will continue on Dex bid, as prescribed.  We reviewed how to take her potassium as well.  Her last MRI was on 8/12 and was stable.  She will let us know if this regimen doesn't work.  She and I reviewed reasons to call our office, and reasons to go to the emergency room at the completion of today's visit.  There was some confusion at whether or not she is taking Lapatinib versus Neratinib.  I will follow up with Denyse Amass, our oral chemotherapy specialist.    Savannah Howard is scheduled to return on 04/16/2017 with Dr. Lindi Adie.  I reviewed the patient and plan with him, and he is in agreement and ok with this follow up date.

## 2017-04-03 NOTE — Progress Notes (Signed)
[facility] Follow up:    Howard, Savannah Lacks, MD [place] [address]   DIAGNOSIS: Cancer Staging No matching staging information was found for the patient.  SUMMARY OF ONCOLOGIC HISTORY:   Breast cancer of upper-outer quadrant of left female breast ([place])   12/07/2014 Initial Diagnosis    Left breast 2:00 position: Invasive ductal carcinoma grade 2, ER 5%, PR 0%, Ki-67 80%, HER-2 positive ratio 6.96      12/07/2014 Mammogram    Large left breast mass 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, 2 small benign nodules in the right breast stable since 2002, other consistent with normal intramammary lymph node left axilla ultrasound normal-sized lymph nodes      12/31/2014 - 04/22/2015 Neo-Adjuvant Chemotherapy    TCH Perjeta neoadjuvant chemotherapy 6 (decreased chemotherapy dosage with cycle 2 and discontinued Perjeta for diarrhea)       04/29/2015 Breast MRI    Significant interval decrease in size of the mass, continued skin retraction, 4.3 x 2 x 1.8 cm (previously 4.9 x 3.9 x 3.9 cm)      05/30/2015 Surgery    Left mastectomy: Path CR, 0/6 LN Negative      09/09/2015 -  Anti-estrogen oral therapy    Anastrozole 1 mg daily, Lapatinib added 09/09/15      07/09/2016 Imaging    Patient presented in the emergency department after a fall at home without help for 24 hours, CT head revealed 3.9 x 3.5 cm cystic mass anterior right temporal lobe with vasogenic edema, MRI revealed 4.9 cm mass      07/09/2016 - 07/16/2016 Hospital Admission    Admitted with brain metastases after a fall at home, rhabdomyolysis       07/13/2016 Surgery    Craniotomy by Dr.Ditty: Metastatic breast cancer ER 40%, PR 0%, HER-2 positive; postcraniotomy MRI showed residual enhancement along the anterior and superior margin, stable 1.3 cm left parietal lobe met.      08/11/2016 Imaging    Interval progression of Rt temporal mets 3.2 cm, Lt parietal intra axial mets 1 cm size       08/13/2016 -  08/24/2016 Radiation Therapy    SRS Rt Temporal and Left Parietal lobes      11/12/2016 Relapse/Recurrence    Mixed interval changes in the right temporal lobe. Partial collapse of the resection cavity with improved enhancement posteriorly but increased enhancement anteriorly, decrease in the left parietal metastases 2 subcentimeter foci of nodular extra-axial enhancement right temporal and right parietal region      01/06/2017 Imaging    Progression: Right anterior temporal pole 14 mm (was 6 mm); right anterior temporal lobe 11 mm (was 5 mm) right posterior temporal due to movement 15 mm (was 6 mm) right parietal dural based 11 mm (was 6 mm) left parietal lobe stable 7 x 8 mm 18 x 29 mm thick and irregular enhancement right temporal lobe resection cavity      02/03/2017 -  Chemotherapy    Lapatinib and anastrozole       CURRENT THERAPY: Lapatinib and Anastrozole  INTERVAL HISTORY: Savannah Howard 71 y.o. female returns for evluation due to nausea and vomiting x 4 days and decreased PO intake.  She has not been able to keep anything down and is miserable.  She does not have any anti emetics at home she can take.  She does live home alone and has home health who assists her.  When she does vomit,  she notes that her left arm will go numb for about a minute or two, then resolve.  She confirms that she is taking Dexamethasone 6 mg PO bid.  She confirms that she is not taking the potassium like she was instructed to by Dr. Lindi Adie.     Patient Active Problem List   Diagnosis Date Noted  . Hypokalemia 01/07/2017  . Hypomagnesemia 01/07/2017  . Hyperglycemia 01/07/2017  . Seizure disorder ([place]) 01/07/2017  . Altered mental status 01/06/2017  . Palliative care by specialist   . Malignant brain tumor ([place] [facility]) 07/18/2016  . Breast cancer metastasized to brain ([facility]) 07/18/2016  . Knee pain, left 07/18/2016  . Pressure injury of skin 07/11/2016  . Fall 07/10/2016  . UTI (urinary tract infection)  07/10/2016  . Rhabdomyolysis 07/10/2016  . Syncope 07/10/2016  . 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 Sepsis ([place]) 07/10/2016  . Urinary tract infection with hematuria   . Brain tumor ([place])   . Antineoplastic chemotherapy induced anemia 04/02/2015  . Weakness generalized 01/29/2015  . Dehydration 01/28/2015  . Rash 01/28/2015  . Chemotherapy induced diarrhea 01/08/2015  . Mucositis due to chemotherapy 01/08/2015  . Breast cancer of upper-outer quadrant of left female breast ([place]) 12/20/2014  . Well adult exam 10/26/2012  . Dyslipidemia 06/23/2011  . Hypertension 12/22/2010  . UPPER RESPIRATORY INFECTION, ACUTE 04/30/2010  . Anxiety 10/28/2009  . Vitamin D deficiency 04/24/2009  . HYPERGLYCEMIA 04/24/2009  . B12 deficiency 10/22/2008  . ALLERGIC RHINITIS 05/09/2007  . GERD 05/09/2007    is allergic to penicillins and sulfonamide derivatives.  MEDICAL HISTORY: Past Medical History:  Diagnosis Date  . Allergy    rhinitis  . Anemia   . Anxiety   . Brain cancer ([place]) 06/2016   breast ca with mets to brain  . DDD (degenerative disc disease), lumbar dx'd in 1980's  . GERD (gastroesophageal reflux disease)   . Heart murmur   . History of blood transfusion 2016 X 3 (05/30/2015)  . Hypertension   . Kidney stone 1990's X 1   "passed it"  . Obesity   . Primary cancer of upper outer quadrant of left breast ([place]) dx'd 03/2015  . Vitamin B 12 deficiency   . Vitamin D deficiency     SURGICAL HISTORY: Past Surgical History:  Procedure Laterality Date  . APPLICATION OF CRANIAL NAVIGATION N/A 07/13/2016   Procedure: APPLICATION OF CRANIAL NAVIGATION;  Surgeon: Kevan Ny Ditty, MD;  Location: [facility];  Service: Neurosurgery;  Laterality: N/A;  . BREAST BIOPSY Left ~ 12/2014  . CHOLECYSTECTOMY OPEN  ~ 1975  . ESOPHAGEAL DILATION  X 3  . MASTECTOMY COMPLETE / SIMPLE W/ SENTINEL NODE BIOPSY Left 05/30/2015  . MASTECTOMY W/ SENTINEL NODE BIOPSY Left 05/30/2015    Procedure: LEFT MASTECTOMY WITH SENTINEL LYMPH NODE BIOPSY;  Surgeon: Autumn Messing III, MD;  Location: [facility];  Service: General;  Laterality: Left;  . PORTACATH PLACEMENT Right 12/26/2014   Procedure: INSERTION PORT-A-CATH;  Surgeon: Autumn Messing III, MD;  Location: [facility];  Service: General;  Laterality: Right;  . PR DURAL GRAFT REPAIR,SPINE DEFECT N/A 07/13/2016   Procedure: Marshia Ly STERIOTACTIC BIOPSY WITH STEALTH;  Surgeon: Kevan Ny Ditty, MD;  Location: [facility];  Service: Neurosurgery;  Laterality: N/A;    SOCIAL HISTORY: Social History   Social History  . Marital status: Single    Spouse name: N/A  . Number of children: N/A  . Years of education: N/A   Occupational History  .  retired Medical laboratory scientific officer   Social History Main Topics  . Smoking status: Never Smoker  . Smokeless tobacco: Never Used  . Alcohol use Yes     Comment: 05/30/2015 "I might take a drink of wine 1-2 times/yr"  . Drug use: No  . Sexual activity: Not Currently   Other Topics Concern  . Not on file   Social History Narrative   Regular exercise- no    FAMILY HISTORY: Family History  Problem Relation Age of Onset  . Heart disease Mother        CAD  . COPD Mother   . Heart disease Father 81       leaky valve  . Parkinsonism Father   . Hypertension Other     Review of Systems  Constitutional: Positive for appetite change and fatigue. Negative for chills, fever and unexpected weight change.  HENT:   Negative for hearing loss and lump/mass.   Eyes: Negative for eye problems and icterus.  Respiratory: Negative for chest tightness, cough and shortness of breath.   Cardiovascular: Negative for chest pain, leg swelling and palpitations.  Gastrointestinal: Positive for nausea and vomiting. Negative for abdominal distention, abdominal pain, constipation and diarrhea.  Endocrine: Negative for hot flashes.  Genitourinary: Negative for difficulty urinating.   Skin: Negative for itching and rash.   Neurological: Positive for numbness. Negative for dizziness and extremity weakness.  Hematological: Negative for adenopathy. Does not bruise/bleed easily.  Psychiatric/Behavioral: Negative for depression. The patient is not nervous/anxious.       PHYSICAL EXAMINATION  ECOG PERFORMANCE STATUS: 2 - Symptomatic, <50% confined to bed  Vitals:   04/02/17 1328  BP: 128/62  Pulse: 84  Resp: 18  Temp: (!) 97.5 F (36.4 C)  SpO2: 100%    Physical Exam  Constitutional: She is oriented to person, place, and time and well-developed, well-nourished, and in no distress.  Appears uncomfortable due to nausea and vomiting  HENT:  Head: Normocephalic and atraumatic.  Mouth/Throat: No oropharyngeal exudate.  Eyes: Pupils are equal, round, and reactive to light. EOM are normal. No scleral icterus.  Difficulty following directions during EOM exam  Neck: Neck supple.  Cardiovascular: Normal rate, regular rhythm and normal heart sounds.   Pulmonary/Chest: Effort normal and breath sounds normal. No respiratory distress. She has no wheezes.  Abdominal: Soft. Bowel sounds are normal.  Musculoskeletal: Normal range of motion. She exhibits no edema.  Strength 5/5 x 4 extremities   Lymphadenopathy:    She has no cervical adenopathy.  Neurological: She is alert and oriented to person, place, and time. No cranial nerve deficit.  Skin: Skin is warm and dry. No rash noted.  Psychiatric: Mood and affect normal.    LABORATORY DATA:  CBC    Component Value Date/Time   WBC 10.4 (H) 04/02/2017 1218   WBC 9.2 01/06/2017 1553   RBC 3.87 04/02/2017 1218   RBC 4.08 01/06/2017 1553   HGB 13.1 04/02/2017 1218   HCT 38.2 04/02/2017 1218   PLT 427 (H) 04/02/2017 1218   MCV 98.7 04/02/2017 1218   MCH 34.0 04/02/2017 1218   MCH 32.8 01/06/2017 1553   MCHC 34.4 04/02/2017 1218   MCHC 33.4 01/06/2017 1553   RDW 17.0 (H) 04/02/2017 1218   LYMPHSABS 2.9 04/02/2017 1218   MONOABS 0.9 04/02/2017 1218    EOSABS 0.0 04/02/2017 1218   BASOSABS 0.1 04/02/2017 1218    CMP     Component Value Date/Time   NA 134 Repeated and Verified (L)  04/02/2017 1218   K 3.2 (L) 04/02/2017 1218   CL 105 02/03/2017 0942   CO2 17 (L) 04/02/2017 1218   GLUCOSE 103 04/02/2017 1218   GLUCOSE 112 (H) 07/15/2006 0921   BUN 18.7 04/02/2017 1218   CREATININE 1.1 04/02/2017 1218   CALCIUM 9.9 04/02/2017 1218   PROT 6.8 04/02/2017 1218   ALBUMIN 3.0 (L) 04/02/2017 1218   AST 13 04/02/2017 1218   ALT 8 04/02/2017 1218   ALKPHOS 73 04/02/2017 1218   BILITOT 0.91 04/02/2017 1218   GFRNONAA >60 01/07/2017 0758   GFRAA >60 01/07/2017 0758         ASSESSMENT and PLAN:   Breast cancer of upper-outer quadrant of left female breast ([place]) Brion is here for nausea and vomiting x 4 days.  When she came into clinic she was vomiting.  She received one liter of normal saline and potassium 15mq IV.  She also received '8mg'$  of IV Zofran while in clinic which settled her nausea.  I sent in a prescription of Zofran for her nausea and vomiting and reviewed with her how to take it.  She will continue on Dex bid, as prescribed.  We reviewed how to take her potassium as well.  Her last MRI was on 8/12 and was stable.  She will let uKoreaknow if this regimen doesn't work.  She and I reviewed reasons to call our office, and reasons to go to the emergency room at the completion of today's visit.  There was some confusion at whether or not she is taking Lapatinib versus Neratinib.  I will follow up with JDenyse Amass our oral chemotherapy specialist.    JSilveris scheduled to return on 04/16/2017 with Dr. GLindi Adie  I reviewed the patient and plan with him, and he is in agreement and ok with this follow up date.   All questions were answered. The patient knows to call the clinic with any problems, questions or concerns. We can certainly see the patient much sooner if necessary.  A total of (45) minutes of face-to-face time was spent with this  patient with greater than 50% of that time in counseling and care-coordination.  This note was electronically signed. LScot Dock NP 04/03/2017

## 2017-04-05 ENCOUNTER — Telehealth: Payer: Self-pay | Admitting: Internal Medicine

## 2017-04-05 DIAGNOSIS — C50412 Malignant neoplasm of upper-outer quadrant of left female breast: Secondary | ICD-10-CM | POA: Diagnosis not present

## 2017-04-05 DIAGNOSIS — F419 Anxiety disorder, unspecified: Secondary | ICD-10-CM | POA: Diagnosis not present

## 2017-04-05 DIAGNOSIS — C7931 Secondary malignant neoplasm of brain: Secondary | ICD-10-CM | POA: Diagnosis not present

## 2017-04-05 DIAGNOSIS — G40909 Epilepsy, unspecified, not intractable, without status epilepticus: Secondary | ICD-10-CM | POA: Diagnosis not present

## 2017-04-05 DIAGNOSIS — N182 Chronic kidney disease, stage 2 (mild): Secondary | ICD-10-CM | POA: Diagnosis not present

## 2017-04-05 DIAGNOSIS — I129 Hypertensive chronic kidney disease with stage 1 through stage 4 chronic kidney disease, or unspecified chronic kidney disease: Secondary | ICD-10-CM | POA: Diagnosis not present

## 2017-04-05 NOTE — Telephone Encounter (Signed)
Verbal Orfers for PT:  2x a week 4 weeks  Also requesting a Medical social worker As well a order for a rollator walker, patient is having to rest more.   [phone] Mickel Baas

## 2017-04-07 DIAGNOSIS — F419 Anxiety disorder, unspecified: Secondary | ICD-10-CM | POA: Diagnosis not present

## 2017-04-07 DIAGNOSIS — G40909 Epilepsy, unspecified, not intractable, without status epilepticus: Secondary | ICD-10-CM | POA: Diagnosis not present

## 2017-04-07 DIAGNOSIS — I129 Hypertensive chronic kidney disease with stage 1 through stage 4 chronic kidney disease, or unspecified chronic kidney disease: Secondary | ICD-10-CM | POA: Diagnosis not present

## 2017-04-07 DIAGNOSIS — N182 Chronic kidney disease, stage 2 (mild): Secondary | ICD-10-CM | POA: Diagnosis not present

## 2017-04-07 DIAGNOSIS — C7931 Secondary malignant neoplasm of brain: Secondary | ICD-10-CM | POA: Diagnosis not present

## 2017-04-07 DIAGNOSIS — C50412 Malignant neoplasm of upper-outer quadrant of left female breast: Secondary | ICD-10-CM | POA: Diagnosis not present

## 2017-04-08 MED FILL — TYKERB 250 MG TABLET: 250 | 30 days supply | Qty: 90 | Fill #0

## 2017-04-13 DIAGNOSIS — I129 Hypertensive chronic kidney disease with stage 1 through stage 4 chronic kidney disease, or unspecified chronic kidney disease: Secondary | ICD-10-CM | POA: Diagnosis not present

## 2017-04-13 DIAGNOSIS — C7931 Secondary malignant neoplasm of brain: Secondary | ICD-10-CM | POA: Diagnosis not present

## 2017-04-13 DIAGNOSIS — N182 Chronic kidney disease, stage 2 (mild): Secondary | ICD-10-CM | POA: Diagnosis not present

## 2017-04-13 DIAGNOSIS — C50412 Malignant neoplasm of upper-outer quadrant of left female breast: Secondary | ICD-10-CM | POA: Diagnosis not present

## 2017-04-13 DIAGNOSIS — G40909 Epilepsy, unspecified, not intractable, without status epilepticus: Secondary | ICD-10-CM | POA: Diagnosis not present

## 2017-04-13 DIAGNOSIS — F419 Anxiety disorder, unspecified: Secondary | ICD-10-CM | POA: Diagnosis not present

## 2017-04-13 NOTE — Telephone Encounter (Signed)
OK. Thx

## 2017-04-14 ENCOUNTER — Encounter: Payer: Self-pay | Admitting: Hematology and Oncology

## 2017-04-14 NOTE — Telephone Encounter (Signed)
LM notifying Mickel Baas

## 2017-04-15 ENCOUNTER — Telehealth: Payer: Self-pay | Admitting: Pharmacist

## 2017-04-15 DIAGNOSIS — F419 Anxiety disorder, unspecified: Secondary | ICD-10-CM | POA: Diagnosis not present

## 2017-04-15 DIAGNOSIS — C50412 Malignant neoplasm of upper-outer quadrant of left female breast: Secondary | ICD-10-CM | POA: Diagnosis not present

## 2017-04-15 DIAGNOSIS — N182 Chronic kidney disease, stage 2 (mild): Secondary | ICD-10-CM | POA: Diagnosis not present

## 2017-04-15 DIAGNOSIS — G40909 Epilepsy, unspecified, not intractable, without status epilepticus: Secondary | ICD-10-CM | POA: Diagnosis not present

## 2017-04-15 DIAGNOSIS — I129 Hypertensive chronic kidney disease with stage 1 through stage 4 chronic kidney disease, or unspecified chronic kidney disease: Secondary | ICD-10-CM | POA: Diagnosis not present

## 2017-04-15 DIAGNOSIS — C7931 Secondary malignant neoplasm of brain: Secondary | ICD-10-CM | POA: Diagnosis not present

## 2017-04-15 NOTE — Telephone Encounter (Signed)
Oral Chemotherapy Pharmacist Encounter  Received notification that patient needed clinical intervention for Tykerb adherence and possible other medication issues.  I called POA, Karis Juba, and had extensive conversation about state of patient's living space and medication adherence.  Jeannie states patient cannot be taking any of her medications as prescribed as she has multiple full bottles of Ativan, Keppra, Zofran, and Tykerb.  [name] states patient is living in a motel room, however, she has been in that room for 15 years. She has pill bottle stored all over the motel room.  I thanked [place] for reaching out to office.  Dr. Geralyn Flash collaborative practice RN has reached out to clinical social work for guidance.  Patient with MD follow-up in AM, Jeannie states Ms. Spainhower cousin would be accompanying her to visit and she would reach out to cousin this evening to let her know that we spoke.  Oral Oncology Clinic will continue to follow.  Thank you,  Johny Drilling, PharmD, BCPS, BCOP 04/15/2017  3:52 PM Oral Oncology Clinic [phone]

## 2017-04-16 ENCOUNTER — Ambulatory Visit (HOSPITAL_BASED_OUTPATIENT_CLINIC_OR_DEPARTMENT_OTHER): Payer: Medicare Other | Admitting: Hematology and Oncology

## 2017-04-16 ENCOUNTER — Ambulatory Visit (HOSPITAL_BASED_OUTPATIENT_CLINIC_OR_DEPARTMENT_OTHER): Payer: Medicare Other

## 2017-04-16 ENCOUNTER — Other Ambulatory Visit: Payer: Self-pay

## 2017-04-16 VITALS — BP 105/57 | HR 64

## 2017-04-16 DIAGNOSIS — Z170 Estrogen receptor positive status [ER+]: Secondary | ICD-10-CM | POA: Diagnosis not present

## 2017-04-16 DIAGNOSIS — C50412 Malignant neoplasm of upper-outer quadrant of left female breast: Secondary | ICD-10-CM

## 2017-04-16 DIAGNOSIS — E860 Dehydration: Secondary | ICD-10-CM

## 2017-04-16 DIAGNOSIS — Z79811 Long term (current) use of aromatase inhibitors: Secondary | ICD-10-CM | POA: Diagnosis not present

## 2017-04-16 DIAGNOSIS — C7931 Secondary malignant neoplasm of brain: Secondary | ICD-10-CM

## 2017-04-16 DIAGNOSIS — E876 Hypokalemia: Secondary | ICD-10-CM | POA: Diagnosis present

## 2017-04-16 DIAGNOSIS — K521 Toxic gastroenteritis and colitis: Secondary | ICD-10-CM

## 2017-04-16 DIAGNOSIS — T451X5A Adverse effect of antineoplastic and immunosuppressive drugs, initial encounter: Secondary | ICD-10-CM

## 2017-04-16 LAB — COMPREHENSIVE METABOLIC PANEL
ALBUMIN: 2.6 g/dL — AB (ref 3.5–5.0)
ALK PHOS: 62 U/L (ref 40–150)
AST: 11 U/L (ref 5–34)
Anion Gap: 18 mEq/L — ABNORMAL HIGH (ref 3–11)
BILIRUBIN TOTAL: 0.74 mg/dL (ref 0.20–1.20)
BUN: 22.1 mg/dL (ref 7.0–26.0)
CO2: 22 meq/L (ref 22–29)
Calcium: 9.2 mg/dL (ref 8.4–10.4)
Chloride: 96 mEq/L — ABNORMAL LOW (ref 98–109)
Creatinine: 1.8 mg/dL — ABNORMAL HIGH (ref 0.6–1.1)
EGFR: 28 mL/min/{1.73_m2} — ABNORMAL LOW (ref >90)
GLUCOSE: 99 mg/dL (ref 70–140)
Potassium: 2.7 mEq/L — CL (ref 3.5–5.1)
SODIUM: 137 meq/L (ref 136–145)
Total Protein: 6.3 g/dL — ABNORMAL LOW (ref 6.4–8.3)

## 2017-04-16 LAB — CBC WITH DIFFERENTIAL/PLATELET
BASO%: 0.5 % (ref 0.0–2.0)
Basophils Absolute: 0.1 10*3/uL (ref 0.0–0.1)
EOS%: 0.2 % (ref 0.0–7.0)
Eosinophils Absolute: 0 10*3/uL (ref 0.0–0.5)
HCT: 35.8 % (ref 34.8–46.6)
HGB: 12.3 g/dL (ref 11.6–15.9)
LYMPH%: 21.8 % (ref 14.0–49.7)
MCH: 34.8 pg — ABNORMAL HIGH (ref 25.1–34.0)
MCHC: 34.5 g/dL (ref 31.5–36.0)
MCV: 100.8 fL (ref 79.5–101.0)
MONO#: 0.9 10*3/uL (ref 0.1–0.9)
MONO%: 8.7 % (ref 0.0–14.0)
NEUT%: 68.8 % (ref 38.4–76.8)
NEUTROS ABS: 7 10*3/uL — AB (ref 1.5–6.5)
PLATELETS: 365 10*3/uL (ref 145–400)
RBC: 3.55 10*6/uL — AB (ref 3.70–5.45)
RDW: 16 % — ABNORMAL HIGH (ref 11.2–14.5)
WBC: 10.2 10*3/uL (ref 3.9–10.3)
lymph#: 2.2 10*3/uL (ref 0.9–3.3)

## 2017-04-16 MED ORDER — SODIUM CHLORIDE 0.9 % IV SOLN
Freq: Once | INTRAVENOUS | Status: AC
  Administered 2017-04-16: 13:00:00 via INTRAVENOUS
  Filled 2017-04-16: qty 100

## 2017-04-16 MED ORDER — POTASSIUM CHLORIDE 10 MEQ/100ML IV SOLN: 10.0000 meq | Freq: Once | INTRAVENOUS | Status: DC

## 2017-04-16 MED ORDER — POTASSIUM CHLORIDE 10 MEQ/100ML IV SOLN
10.0000 meq | Freq: Once | INTRAVENOUS | Status: DC
  Filled 2017-04-16: qty 100

## 2017-04-16 MED ORDER — SODIUM CHLORIDE 0.9 % IJ SOLN
10.0000 mL | INTRAMUSCULAR | Status: DC | PRN
  Administered 2017-04-16: 10 mL
  Filled 2017-04-16: qty 10

## 2017-04-16 MED ORDER — SODIUM CHLORIDE 0.9 % IV SOLN: INTRAVENOUS | Status: DC

## 2017-04-16 MED ORDER — SODIUM CHLORIDE 0.9 % IV SOLN
Freq: Once | INTRAVENOUS | Status: DC
Start: 2017-04-16 — End: 2017-04-16

## 2017-04-16 MED ORDER — HEPARIN SOD (PORK) LOCK FLUSH 100 UNIT/ML IV SOLN
500.0000 [IU] | Freq: Once | INTRAVENOUS | Status: AC | PRN
  Administered 2017-04-16: 500 [IU]
  Filled 2017-04-16: qty 5

## 2017-04-16 MED ORDER — SODIUM CHLORIDE 0.9 % IV SOLN
Freq: Once | INTRAVENOUS | Status: AC
  Administered 2017-04-16: 11:00:00 via INTRAVENOUS
  Filled 2017-04-16: qty 1000

## 2017-04-16 NOTE — Patient Instructions (Signed)

## 2017-04-16 NOTE — Progress Notes (Signed)
Patient Care Team: [name], [name], MD as PCP - General  DIAGNOSIS:  Encounter Diagnosis  Name Primary?  . Malignant neoplasm of upper-outer quadrant of left breast in female, estrogen receptor positive ([place])     SUMMARY OF ONCOLOGIC HISTORY:   Breast cancer of upper-outer quadrant of left female breast ([place])   12/07/2014 Initial Diagnosis    Left breast 2:00 position: Invasive ductal carcinoma grade 2, ER 5%, PR 0%, Ki-67 80%, HER-2 positive ratio 6.96      12/07/2014 Mammogram    Large left breast mass 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, 2 small benign nodules in the right breast stable since 2002, other consistent with normal intramammary lymph node left axilla ultrasound normal-sized lymph nodes      12/31/2014 - 04/22/2015 Neo-Adjuvant Chemotherapy    TCH Perjeta neoadjuvant chemotherapy 6 (decreased chemotherapy dosage with cycle 2 and discontinued Perjeta for diarrhea)       04/29/2015 Breast MRI    Significant interval decrease in size of the mass, continued skin retraction, 4.3 x 2 x 1.8 cm (previously 4.9 x 3.9 x 3.9 cm)      05/30/2015 Surgery    Left mastectomy: Path CR, 0/6 LN Negative      09/09/2015 -  Anti-estrogen oral therapy    Anastrozole 1 mg daily, Lapatinib added 09/09/15      07/09/2016 Imaging    Patient presented in the emergency department after a fall at home without help for 24 hours, CT head revealed 3.9 x 3.5 cm cystic mass anterior right temporal lobe with vasogenic edema, MRI revealed 4.9 cm mass      07/09/2016 - 07/16/2016 Hospital Admission    Admitted with brain metastases after a fall at home, rhabdomyolysis       07/13/2016 Surgery    Craniotomy by Dr.Ditty: Metastatic breast cancer ER 40%, PR 0%, HER-2 positive; postcraniotomy MRI showed residual enhancement along the anterior and superior margin, stable 1.3 cm left parietal lobe met.      08/11/2016 Imaging    Interval progression of Rt temporal mets 3.2 cm, Lt parietal intra axial mets 1 cm  size       08/13/2016 - 08/24/2016 Radiation Therapy    SRS Rt Temporal and Left Parietal lobes      11/12/2016 Relapse/Recurrence    Mixed interval changes in the right temporal lobe. Partial collapse of the resection cavity with improved enhancement posteriorly but increased enhancement anteriorly, decrease in the left parietal metastases 2 subcentimeter foci of nodular extra-axial enhancement right temporal and right parietal region      01/06/2017 Imaging    Progression: Right anterior temporal pole 14 mm (was 6 mm); right anterior temporal lobe 11 mm (was 5 mm) right posterior temporal due to movement 15 mm (was 6 mm) right parietal dural based 11 mm (was 6 mm) left parietal lobe stable 7 x 8 mm 18 x 29 mm thick and irregular enhancement right temporal lobe resection cavity      02/03/2017 -  Chemotherapy    Lapatinib and anastrozole       CHIEF COMPLIANT: Metastatic breast cancer follow-up  INTERVAL HISTORY: Savannah Howard is a 71 year old with above-mentioned history of metastatic breast cancer with brain metastases who was being treated with lapatinib and anastrozole. Over the course of several months there were multiple questions raised about her compliance with medications. Initially she thought she was taking a different medication and that led to further investigation. It revealed that the patient  has not been taking any of the medications that we have been prescribing. Instead she has been hoarding all these medications in the motel room that she currently lives in. She apparently also has a house which has been completely full of "stuff' and is in unlivable condition. He received most of this information with the help of a letter from her friend. It is unclear if the patient is incapable of handling her medications or whether she has a psychiatric problem underlying this issue.   When I discussed this with the patient,  She denied any of these findings. She reports that she is  getting help from her children as well as a Education officer, museum who is coming to see her every other day.   REVIEW OF SYSTEMS:   Constitutional: Denies fevers, chills or abnormal weight loss Eyes: Denies blurriness of vision Ears, nose, mouth, throat, and face: Denies mucositis or sore throat Respiratory: Denies cough, dyspnea or wheezes Cardiovascular: Denies palpitation, chest discomfort Gastrointestinal:  Denies nausea, heartburn or change in bowel habits Skin: Denies abnormal skin rashes Lymphatics: Denies new lymphadenopathy or easy bruising Neurological: Generalized weakness Behavioral/Psych: Mood is stable, no new changes  Extremities: No lower extremity edema All other systems were reviewed with the patient and are negative.  I have reviewed the past medical history, past surgical history, social history and family history with the patient and they are unchanged from previous note.  ALLERGIES:  is allergic to penicillins and sulfonamide derivatives.  MEDICATIONS:  Current Outpatient Prescriptions  Medication Sig Dispense Refill  . acetaminophen (TYLENOL) 500 MG tablet Take 1,000 mg by mouth at bedtime as needed for mild pain or headache.     . anastrozole (ARIMIDEX) 1 MG tablet TAKE 1 TABLET (1 MG TOTAL) BY MOUTH DAILY. 90 tablet 2  . Cholecalciferol (VITAMIN D3) 2000 units capsule Take 1 capsule (2,000 Units total) by mouth daily. 100 capsule 3  . cyanocobalamin (,VITAMIN B-12,) 1000 MCG/ML injection INJECT 1 ML SUBCUTANEOUSLY EVERY 30 DAYS 1 mL 3  . dexamethasone (DECADRON) 6 MG tablet Take 1 tablet (6 mg total) by mouth 2 (two) times daily. 60 tablet 0  . lapatinib (TYKERB) 250 MG tablet Take 3 tablets (750 mg total) by mouth daily. 90 tablet 3  . levETIRAcetam (KEPPRA) 1000 MG tablet Take 1 tablet (1,000 mg total) by mouth 2 (two) times daily. 60 tablet 1  . LORazepam (ATIVAN) 0.5 MG tablet TAKE 1 TABLET BY MOUTH 3 TIMES A DAY AS NEEDED FOR ANXIETY 90 tablet 3  .  losartan-hydrochlorothiazide (HYZAAR) 100-25 MG tablet TAKE 1 TABLET BY MOUTH EVERY DAY 90 tablet 2  . magnesium gluconate (MAGONATE) 500 MG tablet Take 1 tablet (500 mg total) by mouth 2 (two) times daily. 60 tablet 11  . metoprolol succinate (TOPROL-XL) 50 MG 24 hr tablet TAKE 1/2 TABLET (25 MG TOTAL) BY MOUTH EVERY DAY 15 tablet 3  . omeprazole (PRILOSEC) 20 MG capsule Take 20 mg by mouth daily.    . ondansetron (ZOFRAN) 8 MG tablet Take 1 tablet (8 mg total) by mouth every 8 (eight) hours as needed for nausea or vomiting. 20 tablet 0  . potassium chloride (MICRO-K) 10 MEQ CR capsule TAKE 1 CAPSULE BY MOUTH EVERY DAY 90 capsule 2  . terazosin (HYTRIN) 5 MG capsule Take 5 mg by mouth at bedtime. Do not crush     No current facility-administered medications for this visit.     PHYSICAL EXAMINATION: ECOG PERFORMANCE STATUS: 1 - Symptomatic but completely  ambulatory  Vitals:   04/16/17 0834  BP: 137/65  Pulse: 73  Resp: 18  Temp: 97.7 F (36.5 C)  SpO2: 100%   Filed Weights   04/16/17 0834  Weight: 181 lb 9.6 oz (82.4 kg)    GENERAL:alert, no distress and comfortable SKIN: skin color, texture, turgor are normal, no rashes or significant lesions EYES: normal, Conjunctiva are pink and non-injected, sclera clear OROPHARYNX:no exudate, no erythema and lips, buccal mucosa, and tongue normal  NECK: supple, thyroid normal size, non-tender, without nodularity LYMPH:  no palpable lymphadenopathy in the cervical, axillary or inguinal LUNGS: clear to auscultation and percussion with normal breathing effort HEART: regular rate & rhythm and no murmurs and no lower extremity edema ABDOMEN:abdomen soft, non-tender and normal bowel sounds MUSCULOSKELETAL:no cyanosis of digits and no clubbing  NEURO: alert & oriented x 3 with fluent speech, no focal motor/sensory deficits EXTREMITIES: No lower extremity edema  LABORATORY DATA:  I have reviewed the data as listed   Chemistry      Component  Value Date/Time   NA 134 Repeated and Verified (L) 04/02/2017 1218   K 3.2 (L) 04/02/2017 1218   CL 105 02/03/2017 0942   CO2 17 (L) 04/02/2017 1218   BUN 18.7 04/02/2017 1218   CREATININE 1.1 04/02/2017 1218   GLU 115 07/20/2016      Component Value Date/Time   CALCIUM 9.9 04/02/2017 1218   ALKPHOS 73 04/02/2017 1218   AST 13 04/02/2017 1218   ALT 8 04/02/2017 1218   BILITOT 0.91 04/02/2017 1218       Lab Results  Component Value Date   WBC 10.4 (H) 04/02/2017   HGB 13.1 04/02/2017   HCT 38.2 04/02/2017   MCV 98.7 04/02/2017   PLT 427 (H) 04/02/2017   NEUTROABS 6.6 (H) 04/02/2017    ASSESSMENT & PLAN:  Breast cancer of upper-outer quadrant of left female breast ([facility]) 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 TCH-Perjeta 6 cycles started 12/31/2014 completed 04/22/2015 (decreased chemotherapy dosage with cycle 2 and discontinued Perjeta for diarrhea) 2. Left mastectomy 05/30/2015: CR to chemo, 0/6 LN  Did not need radiation based upon tumor board recommendation 3. Herceptin maintenance completed May 2017 4. Brain metastases diagnosed November 2017 5. Stereotactic radiosurgery by Dr. Tammi Klippel 08/13/16 to 08/24/16 6. Anastrozole started 09/08/2016  7. SRS to recurrent disease 01/21/2017  Current treatment: Lapatinib 1000 mg by mouth daily along with anastrozole 1 mg daily started 02/03/2017, stopped 03/10/2017 reduced dosage to 750 mg starting 03/19/2017 (This will be discontinued because of difficulty in assessing compliance with the treatment.)  We found out the patient may be a big time hoarder and that she may not be taking any of her medications as prescribed. We were informed that she had bottles and bottles of prescription medications in her motel room. She has a house but that her house is completely  filled with stuff that she had hoarded over a long period of time. Patient denies this and tells me that the house that she has is down to the studs and is not livable.  She is not a candidate for any further oral therapies. We will not be prescribing any further oral treatment with anastrozole or lapatinib.  Plan: Intravenous therapy with Herceptin along with Faslodex My plan is to start  her treatment next week. I recommended that she get IV fluids with 10 mEq of potassium chloride today. We will also get labs drawn today. Brain MRI was reviewed: The brain lesions appear to be smaller with less edema.  I spent 25 minutes talking to the patient of which more than half was spent in counseling and coordination of care.  No orders of the defined types were placed in this encounter.  The patient has a good understanding of the overall plan. she agrees with it. she will call with any problems that may develop before the next visit here.   Rulon Eisenmenger, MD 04/16/17

## 2017-04-16 NOTE — Assessment & Plan Note (Signed)
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 TCH-Perjeta 6 cycles started 12/31/2014 completed 04/22/2015 (decreased chemotherapy dosage with cycle 2 and discontinued Perjeta for diarrhea) 2. Left mastectomy 05/30/2015: CR to chemo, 0/6 LN  Did not need radiation based upon tumor board recommendation 3. Herceptin maintenance completed May 2017 4. Brain metastases diagnosed November 2017 5. Stereotactic radiosurgery by Dr. Tammi Klippel 08/13/16 to 08/24/16 6. Anastrozole started 09/08/2016  7. SRS to recurrent disease 01/21/2017  Current treatment: Lapatinib 1000 mg by mouth daily along with anastrozole 1 mg daily started 02/03/2017, stopped 03/10/2017 reduced dosage to 750 mg starting 03/19/2017   We found out the patient is a big time hoarder and that she has not been taking any of her medications as prescribed. She is in fact got bottles and bottles of prescription medications or lower her motel room. She has a house but that her house is completely filled with stuff that she had hoarded over a long period of time.  I believe that she needs to see psychiatry to help her with her problem. She is not a candidate for any further oral therapies. We will not be prescribing any further oral treatment with anastrozole or lapatinib.  Plan: Intravenous therapy with Herceptin along with Faslodex

## 2017-04-17 ENCOUNTER — Encounter (HOSPITAL_COMMUNITY): Payer: Self-pay

## 2017-04-17 ENCOUNTER — Inpatient Hospital Stay (HOSPITAL_COMMUNITY)
Admission: EM | Admit: 2017-04-17 | Discharge: 2017-04-20 | DRG: 101 | Disposition: A | Discharge status: Home / Self Care | Payer: Medicare Other | Attending: Internal Medicine | Admitting: Internal Medicine

## 2017-04-17 ENCOUNTER — Emergency Department (HOSPITAL_COMMUNITY): Payer: Medicare Other

## 2017-04-17 ENCOUNTER — Other Ambulatory Visit: Payer: Self-pay

## 2017-04-17 DIAGNOSIS — E538 Deficiency of other specified B group vitamins: Secondary | ICD-10-CM | POA: Diagnosis not present

## 2017-04-17 DIAGNOSIS — R9431 Abnormal electrocardiogram [ECG] [EKG]: Secondary | ICD-10-CM | POA: Diagnosis not present

## 2017-04-17 DIAGNOSIS — B9620 Unspecified Escherichia coli [E. coli] as the cause of diseases classified elsewhere: Secondary | ICD-10-CM | POA: Diagnosis present

## 2017-04-17 DIAGNOSIS — Z6834 Body mass index (BMI) 34.0-34.9, adult: Secondary | ICD-10-CM

## 2017-04-17 DIAGNOSIS — Z8249 Family history of ischemic heart disease and other diseases of the circulatory system: Secondary | ICD-10-CM | POA: Diagnosis not present

## 2017-04-17 DIAGNOSIS — R739 Hyperglycemia, unspecified: Secondary | ICD-10-CM | POA: Diagnosis present

## 2017-04-17 DIAGNOSIS — E669 Obesity, unspecified: Secondary | ICD-10-CM | POA: Diagnosis present

## 2017-04-17 DIAGNOSIS — Z9119 Patient's noncompliance with other medical treatment and regimen: Secondary | ICD-10-CM

## 2017-04-17 DIAGNOSIS — Z880 Allergy status to penicillin: Secondary | ICD-10-CM | POA: Diagnosis not present

## 2017-04-17 DIAGNOSIS — G4089 Other seizures: Secondary | ICD-10-CM | POA: Diagnosis not present

## 2017-04-17 DIAGNOSIS — C7931 Secondary malignant neoplasm of brain: Secondary | ICD-10-CM | POA: Diagnosis not present

## 2017-04-17 DIAGNOSIS — K521 Toxic gastroenteritis and colitis: Secondary | ICD-10-CM | POA: Diagnosis present

## 2017-04-17 DIAGNOSIS — Z882 Allergy status to sulfonamides status: Secondary | ICD-10-CM | POA: Diagnosis not present

## 2017-04-17 DIAGNOSIS — M7989 Other specified soft tissue disorders: Secondary | ICD-10-CM | POA: Diagnosis not present

## 2017-04-17 DIAGNOSIS — Z79899 Other long term (current) drug therapy: Secondary | ICD-10-CM | POA: Diagnosis not present

## 2017-04-17 DIAGNOSIS — F419 Anxiety disorder, unspecified: Secondary | ICD-10-CM | POA: Diagnosis present

## 2017-04-17 DIAGNOSIS — Z9114 Patient's other noncompliance with medication regimen: Secondary | ICD-10-CM

## 2017-04-17 DIAGNOSIS — E860 Dehydration: Secondary | ICD-10-CM | POA: Diagnosis present

## 2017-04-17 DIAGNOSIS — G40109 Localization-related (focal) (partial) symptomatic epilepsy and epileptic syndromes with simple partial seizures, not intractable, without status epilepticus: Secondary | ICD-10-CM | POA: Diagnosis not present

## 2017-04-17 DIAGNOSIS — N390 Urinary tract infection, site not specified: Secondary | ICD-10-CM | POA: Diagnosis present

## 2017-04-17 DIAGNOSIS — I10 Essential (primary) hypertension: Secondary | ICD-10-CM | POA: Diagnosis present

## 2017-04-17 DIAGNOSIS — Z825 Family history of asthma and other chronic lower respiratory diseases: Secondary | ICD-10-CM

## 2017-04-17 DIAGNOSIS — C50911 Malignant neoplasm of unspecified site of right female breast: Secondary | ICD-10-CM

## 2017-04-17 DIAGNOSIS — R29810 Facial weakness: Secondary | ICD-10-CM | POA: Diagnosis not present

## 2017-04-17 DIAGNOSIS — Z9889 Other specified postprocedural states: Secondary | ICD-10-CM | POA: Diagnosis not present

## 2017-04-17 DIAGNOSIS — R569 Unspecified convulsions: Secondary | ICD-10-CM

## 2017-04-17 DIAGNOSIS — C50919 Malignant neoplasm of unspecified site of unspecified female breast: Secondary | ICD-10-CM | POA: Diagnosis present

## 2017-04-17 DIAGNOSIS — K219 Gastro-esophageal reflux disease without esophagitis: Secondary | ICD-10-CM | POA: Diagnosis not present

## 2017-04-17 DIAGNOSIS — Z820 Family history of epilepsy and other diseases of the nervous system: Secondary | ICD-10-CM | POA: Diagnosis not present

## 2017-04-17 DIAGNOSIS — Z9049 Acquired absence of other specified parts of digestive tract: Secondary | ICD-10-CM

## 2017-04-17 DIAGNOSIS — Z9012 Acquired absence of left breast and nipple: Secondary | ICD-10-CM

## 2017-04-17 DIAGNOSIS — C50412 Malignant neoplasm of upper-outer quadrant of left female breast: Secondary | ICD-10-CM | POA: Diagnosis not present

## 2017-04-17 DIAGNOSIS — Z66 Do not resuscitate: Secondary | ICD-10-CM | POA: Diagnosis present

## 2017-04-17 DIAGNOSIS — E876 Hypokalemia: Secondary | ICD-10-CM | POA: Diagnosis present

## 2017-04-17 DIAGNOSIS — R7309 Other abnormal glucose: Secondary | ICD-10-CM | POA: Diagnosis present

## 2017-04-17 DIAGNOSIS — C50912 Malignant neoplasm of unspecified site of left female breast: Secondary | ICD-10-CM

## 2017-04-17 DIAGNOSIS — E559 Vitamin D deficiency, unspecified: Secondary | ICD-10-CM | POA: Diagnosis not present

## 2017-04-17 DIAGNOSIS — R251 Tremor, unspecified: Secondary | ICD-10-CM | POA: Diagnosis present

## 2017-04-17 DIAGNOSIS — E785 Hyperlipidemia, unspecified: Secondary | ICD-10-CM | POA: Diagnosis not present

## 2017-04-17 DIAGNOSIS — R9401 Abnormal electroencephalogram [EEG]: Secondary | ICD-10-CM | POA: Diagnosis present

## 2017-04-17 DIAGNOSIS — T451X5A Adverse effect of antineoplastic and immunosuppressive drugs, initial encounter: Secondary | ICD-10-CM | POA: Diagnosis present

## 2017-04-17 DIAGNOSIS — R471 Dysarthria and anarthria: Secondary | ICD-10-CM | POA: Diagnosis present

## 2017-04-17 DIAGNOSIS — R531 Weakness: Secondary | ICD-10-CM | POA: Diagnosis not present

## 2017-04-17 DIAGNOSIS — R29818 Other symptoms and signs involving the nervous system: Secondary | ICD-10-CM | POA: Diagnosis not present

## 2017-04-17 DIAGNOSIS — I6789 Other cerebrovascular disease: Secondary | ICD-10-CM | POA: Diagnosis not present

## 2017-04-17 DIAGNOSIS — D496 Neoplasm of unspecified behavior of brain: Secondary | ICD-10-CM | POA: Diagnosis present

## 2017-04-17 LAB — MAGNESIUM: MAGNESIUM: 1.1 mg/dL — AB (ref 1.7–2.4)

## 2017-04-17 LAB — URINALYSIS, ROUTINE W REFLEX MICROSCOPIC
Bilirubin Urine: NEGATIVE
GLUCOSE, UA: NEGATIVE mg/dL
HGB URINE DIPSTICK: NEGATIVE
KETONES UR: 5 mg/dL — AB
Nitrite: POSITIVE — AB
PROTEIN: NEGATIVE mg/dL
Specific Gravity, Urine: 1.015 (ref 1.005–1.030)
pH: 5 (ref 5.0–8.0)

## 2017-04-17 LAB — COMPREHENSIVE METABOLIC PANEL
ALBUMIN: 2.5 g/dL — AB (ref 3.5–5.0)
ALT: 10 U/L — ABNORMAL LOW (ref 14–54)
AST: 21 U/L (ref 15–41)
Alkaline Phosphatase: 49 U/L (ref 38–126)
Anion gap: 13 (ref 5–15)
BILIRUBIN TOTAL: 1 mg/dL (ref 0.3–1.2)
BUN: 15 mg/dL (ref 6–20)
CHLORIDE: 100 mmol/L — AB (ref 101–111)
CO2: 24 mmol/L (ref 22–32)
Calcium: 8.4 mg/dL — ABNORMAL LOW (ref 8.9–10.3)
Creatinine, Ser: 1.3 mg/dL — ABNORMAL HIGH (ref 0.44–1.00)
GFR calc Af Amer: 46 mL/min — ABNORMAL LOW (ref >60)
GFR calc non Af Amer: 40 mL/min — ABNORMAL LOW (ref >60)
GLUCOSE: 132 mg/dL — AB (ref 65–99)
POTASSIUM: 3.1 mmol/L — AB (ref 3.5–5.1)
Sodium: 137 mmol/L (ref 135–145)
Total Protein: 5.5 g/dL — ABNORMAL LOW (ref 6.5–8.1)

## 2017-04-17 LAB — I-STAT TROPONIN, ED: Troponin i, poc: 0.01 ng/mL (ref 0.00–0.08)

## 2017-04-17 LAB — CBC WITH DIFFERENTIAL/PLATELET
BASOS ABS: 0 10*3/uL (ref 0.0–0.1)
BASOS PCT: 0 %
EOS ABS: 0 10*3/uL (ref 0.0–0.7)
Eosinophils Relative: 0 %
HCT: 35.3 % — ABNORMAL LOW (ref 36.0–46.0)
HEMOGLOBIN: 11.8 g/dL — AB (ref 12.0–15.0)
Lymphocytes Relative: 20 %
Lymphs Abs: 1.7 10*3/uL (ref 0.7–4.0)
MCH: 34.2 pg — ABNORMAL HIGH (ref 26.0–34.0)
MCHC: 33.4 g/dL (ref 30.0–36.0)
MCV: 102.3 fL — ABNORMAL HIGH (ref 78.0–100.0)
Monocytes Absolute: 0.3 10*3/uL (ref 0.1–1.0)
Monocytes Relative: 4 %
NEUTROS ABS: 6.7 10*3/uL (ref 1.7–7.7)
NEUTROS PCT: 76 %
Platelets: 308 10*3/uL (ref 150–400)
RBC: 3.45 MIL/uL — AB (ref 3.87–5.11)
RDW: 15.6 % — ABNORMAL HIGH (ref 11.5–15.5)
WBC: 8.7 10*3/uL (ref 4.0–10.5)

## 2017-04-17 LAB — CBG MONITORING, ED: Glucose-Capillary: 134 mg/dL — ABNORMAL HIGH (ref 65–99)

## 2017-04-17 LAB — I-STAT CHEM 8, ED
BUN: 17 mg/dL (ref 6–20)
CHLORIDE: 98 mmol/L — AB (ref 101–111)
Calcium, Ion: 1.08 mmol/L — ABNORMAL LOW (ref 1.15–1.40)
Creatinine, Ser: 1.1 mg/dL — ABNORMAL HIGH (ref 0.44–1.00)
Glucose, Bld: 133 mg/dL — ABNORMAL HIGH (ref 65–99)
HEMATOCRIT: 35 % — AB (ref 36.0–46.0)
Hemoglobin: 11.9 g/dL — ABNORMAL LOW (ref 12.0–15.0)
POTASSIUM: 3 mmol/L — AB (ref 3.5–5.1)
SODIUM: 138 mmol/L (ref 135–145)
TCO2: 25 mmol/L (ref 22–32)

## 2017-04-17 LAB — MRSA PCR SCREENING: MRSA BY PCR: NEGATIVE

## 2017-04-17 LAB — PROTIME-INR
INR: 0.99
Prothrombin Time: 13 seconds (ref 11.4–15.2)

## 2017-04-17 LAB — APTT: APTT: 23 s — AB (ref 24–36)

## 2017-04-17 MED ORDER — SODIUM CHLORIDE 0.9 % IV SOLN
200.0000 mg | Freq: Once | INTRAVENOUS | Status: AC
  Administered 2017-04-17: 200 mg via INTRAVENOUS
  Filled 2017-04-17: qty 20

## 2017-04-17 MED ORDER — ENOXAPARIN SODIUM 40 MG/0.4ML ~~LOC~~ SOLN
40.0000 mg | SUBCUTANEOUS | Status: DC
  Administered 2017-04-17 – 2017-04-19 (×3): 40 mg via SUBCUTANEOUS
  Filled 2017-04-17 (×3): qty 0.4

## 2017-04-17 MED ORDER — PANTOPRAZOLE SODIUM 40 MG PO TBEC
40.0000 mg | DELAYED_RELEASE_TABLET | Freq: Every day | ORAL | Status: DC
  Administered 2017-04-18 – 2017-04-20 (×3): 40 mg via ORAL
  Filled 2017-04-17 (×3): qty 1

## 2017-04-17 MED ORDER — POTASSIUM CHLORIDE 10 MEQ/100ML IV SOLN
10.0000 meq | INTRAVENOUS | Status: AC
  Administered 2017-04-17 – 2017-04-18 (×4): 10 meq via INTRAVENOUS
  Filled 2017-04-17 (×4): qty 100

## 2017-04-17 MED ORDER — ONDANSETRON HCL 4 MG/2ML IJ SOLN: 4.0000 mg | Freq: Four times a day (QID) | INTRAMUSCULAR | Status: DC | PRN

## 2017-04-17 MED ORDER — METOPROLOL SUCCINATE ER 25 MG PO TB24
25.0000 mg | ORAL_TABLET | Freq: Every day | ORAL | Status: DC
  Administered 2017-04-17 – 2017-04-20 (×4): 25 mg via ORAL
  Filled 2017-04-17 (×4): qty 1

## 2017-04-17 MED ORDER — SENNOSIDES-DOCUSATE SODIUM 8.6-50 MG PO TABS
1.0000 | ORAL_TABLET | Freq: Every evening | ORAL | Status: DC | PRN
  Filled 2017-04-17: qty 1

## 2017-04-17 MED ORDER — LEVETIRACETAM 500 MG/5ML IV SOLN
1000.0000 mg | Freq: Once | INTRAVENOUS | Status: AC
  Administered 2017-04-17: 1000 mg via INTRAVENOUS
  Filled 2017-04-17 (×2): qty 10

## 2017-04-17 MED ORDER — ACETAMINOPHEN 325 MG PO TABS: 650.0000 mg | ORAL_TABLET | ORAL | Status: DC | PRN

## 2017-04-17 MED ORDER — SORBITOL 70 % SOLN: 30.0000 mL | Freq: Every day | Status: DC | PRN

## 2017-04-17 MED ORDER — SODIUM CHLORIDE 0.9 % IV SOLN
INTRAVENOUS | Status: DC
  Administered 2017-04-17: 18:00:00 via INTRAVENOUS
  Filled 2017-04-17 (×3): qty 1000

## 2017-04-17 MED ORDER — LORAZEPAM BOLUS VIA INFUSION
1.0000 mg | INTRAVENOUS | Status: DC | PRN
  Administered 2017-04-17: 1 mg via INTRAVENOUS
  Filled 2017-04-17: qty 1

## 2017-04-17 MED ORDER — SODIUM CHLORIDE 0.9 % IV SOLN
100.0000 mg | Freq: Two times a day (BID) | INTRAVENOUS | Status: DC
  Administered 2017-04-18 – 2017-04-19 (×3): 100 mg via INTRAVENOUS
  Filled 2017-04-17 (×3): qty 10

## 2017-04-17 MED ORDER — SENNA 8.6 MG PO TABS
1.0000 | ORAL_TABLET | Freq: Two times a day (BID) | ORAL | Status: DC
  Administered 2017-04-18 – 2017-04-20 (×3): 8.6 mg via ORAL
  Filled 2017-04-17 (×4): qty 1

## 2017-04-17 MED ORDER — IOPAMIDOL (ISOVUE-370) INJECTION 76%
INTRAVENOUS | Status: AC
  Filled 2017-04-17: qty 100

## 2017-04-17 MED ORDER — ONDANSETRON HCL 4 MG PO TABS: 4.0000 mg | ORAL_TABLET | Freq: Four times a day (QID) | ORAL | Status: DC | PRN

## 2017-04-17 MED ORDER — LORAZEPAM 2 MG/ML IJ SOLN
1.0000 mg | Freq: Once | INTRAMUSCULAR | Status: AC
  Administered 2017-04-17: 1 mg via INTRAVENOUS
  Filled 2017-04-17: qty 1

## 2017-04-17 MED ORDER — VITAMIN D 1000 UNITS PO TABS
2000.0000 [IU] | ORAL_TABLET | Freq: Every day | ORAL | Status: DC
Start: 2017-04-17 — End: 2017-04-20
  Administered 2017-04-18 – 2017-04-20 (×3): 2000 [IU] via ORAL
  Filled 2017-04-17 (×4): qty 2

## 2017-04-17 MED ORDER — ACETAMINOPHEN 650 MG RE SUPP: 650.0000 mg | RECTAL | Status: DC | PRN

## 2017-04-17 MED ORDER — SODIUM CHLORIDE 0.9 % IV SOLN
500.0000 mg | Freq: Two times a day (BID) | INTRAVENOUS | Status: DC
  Administered 2017-04-18 – 2017-04-19 (×3): 500 mg via INTRAVENOUS
  Filled 2017-04-17 (×4): qty 5

## 2017-04-17 MED ORDER — DEXAMETHASONE SODIUM PHOSPHATE 4 MG/ML IJ SOLN
4.0000 mg | Freq: Four times a day (QID) | INTRAMUSCULAR | Status: DC
  Administered 2017-04-17 – 2017-04-19 (×7): 4 mg via INTRAVENOUS
  Filled 2017-04-17 (×7): qty 1

## 2017-04-17 MED ORDER — MAGNESIUM CITRATE PO SOLN: 1.0000 | Freq: Once | ORAL | Status: DC | PRN

## 2017-04-17 MED ORDER — SODIUM CHLORIDE 0.9 % IV SOLN: 100.0000 mg | Freq: Two times a day (BID) | INTRAVENOUS | Status: DC

## 2017-04-17 MED ORDER — SODIUM CHLORIDE 0.9 % IV SOLN
75.0000 mL/h | INTRAVENOUS | Status: DC
  Administered 2017-04-17 – 2017-04-19 (×4): 75 mL/h via INTRAVENOUS

## 2017-04-17 MED ORDER — LORAZEPAM 2 MG/ML IJ SOLN
INTRAMUSCULAR | Status: AC
  Filled 2017-04-17: qty 1

## 2017-04-17 MED ORDER — MAGNESIUM GLUCONATE 500 MG PO TABS
500.0000 mg | ORAL_TABLET | Freq: Two times a day (BID) | ORAL | Status: DC
  Administered 2017-04-17 – 2017-04-20 (×6): 500 mg via ORAL
  Filled 2017-04-17 (×8): qty 1

## 2017-04-17 NOTE — ED Triage Notes (Signed)
Pt presents with L sided facial droop and slurred speech noted at approximately 1300 today by friend.

## 2017-04-17 NOTE — Code Documentation (Signed)
71 y.o. female with PMHx of seizures, obesity, breast Ca w/ mets to the brain and HTN who presented to [facility] ED for an acute onset of left-sided weakness and left-sided facial twitching that started around 1300 today. Her medical power of attorney is at bedside and states that the patient is not compliant with her medications including Keppra and Ativan. CT with no acute abnormality. ASPECTS 10. NIHSS 5. VAN (-). See EMR for NIHSS and code stroke times. On assessment, patient with mild left-sided facial droop, mild decrease in sensation to her left lower face, severe dysarthria and a rhythmic appearing twitch to her lower face. IV tPA not given d/t stroke not being suspected. Per neuro MD, seizure more likely than stroke. ED bedside handoff with ED RN Gregary Signs.

## 2017-04-17 NOTE — Progress Notes (Signed)
SLP Cancellation Note  Patient Details Name: Savannah Howard MRN: [id number] DOB: 1945/11/01   Cancelled treatment:       Reason Eval/Treat Not Completed: Patient at procedure or test/unavailable; pt remains in ED; MRI pending; failed NSSS   Elvina Sidle, M.S., CCC-SLP 04/17/2017, 4:27 PM

## 2017-04-17 NOTE — ED Provider Notes (Signed)
[facility] DEPT Provider Note   CSN: [id number] Arrival date & time: 04/17/17  1358     History   Chief Complaint No chief complaint on file.   HPI Savannah Howard is a 71 y.o. female.  Patient arrived as a potential code stroke. Does seem to bridge by neuro hospitalist and taken to head CT. Patient has a history of metastatic breast cancer to the brain. Patient's main symptoms were left spatial droop. And twitching of the left side of her face.      Past Medical History:  Diagnosis Date  . Allergy    rhinitis  . Anemia   . Anxiety   . Brain cancer ([place]) 06/2016   breast ca with mets to brain  . DDD (degenerative disc disease), lumbar dx'd in 1980's  . GERD (gastroesophageal reflux disease)   . Heart murmur   . History of blood transfusion 2016 X 3 (05/30/2015)  . Hypertension   . Kidney stone 1990's X 1   "passed it"  . Obesity   . Primary cancer of upper outer quadrant of left breast ([place]) dx'd 03/2015  . Vitamin B 12 deficiency   . Vitamin D deficiency     Patient Active Problem List   Diagnosis Date Noted  . Seizures ([place]) 04/17/2017  . Hypokalemia 01/07/2017  . Hypomagnesemia 01/07/2017  . Hyperglycemia 01/07/2017  . Seizure disorder ([place]) 01/07/2017  . Altered mental status 01/06/2017  . Palliative care by specialist   . Malignant brain tumor ([place]) 07/18/2016  . Breast cancer metastasized to brain ([place]) 07/18/2016  . Knee pain, left 07/18/2016  . Pressure injury of skin 07/11/2016  . Fall 07/10/2016  . UTI (urinary tract infection) 07/10/2016  . Rhabdomyolysis 07/10/2016  . Syncope 07/10/2016  . 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 Sepsis ([place]) 07/10/2016  . Urinary tract infection with hematuria   . Brain tumor ([place])   . Antineoplastic chemotherapy induced anemia 04/02/2015  . Weakness generalized 01/29/2015  . Dehydration 01/28/2015  . Rash 01/28/2015  . Chemotherapy induced diarrhea 01/08/2015  . Mucositis due to chemotherapy  01/08/2015  . Breast cancer of upper-outer quadrant of left female breast ([place]) 12/20/2014  . Well adult exam 10/26/2012  . Dyslipidemia 06/23/2011  . Hypertension 12/22/2010  . UPPER RESPIRATORY INFECTION, ACUTE 04/30/2010  . Anxiety 10/28/2009  . Vitamin D deficiency 04/24/2009  . HYPERGLYCEMIA 04/24/2009  . B12 deficiency 10/22/2008  . ALLERGIC RHINITIS 05/09/2007  . GERD 05/09/2007    Past Surgical History:  Procedure Laterality Date  . APPLICATION OF CRANIAL NAVIGATION N/A 07/13/2016   Procedure: APPLICATION OF CRANIAL NAVIGATION;  Surgeon: Kevan Ny Ditty, MD;  Location: [facility];  Service: Neurosurgery;  Laterality: N/A;  . BREAST BIOPSY Left ~ 12/2014  . CHOLECYSTECTOMY OPEN  ~ 1975  . ESOPHAGEAL DILATION  X 3  . MASTECTOMY COMPLETE / SIMPLE W/ SENTINEL NODE BIOPSY Left 05/30/2015  . MASTECTOMY W/ SENTINEL NODE BIOPSY Left 05/30/2015   Procedure: LEFT MASTECTOMY WITH SENTINEL LYMPH NODE BIOPSY;  Surgeon: Autumn Messing III, MD;  Location: [facility];  Service: General;  Laterality: Left;  . PORTACATH PLACEMENT Right 12/26/2014   Procedure: INSERTION PORT-A-CATH;  Surgeon: Autumn Messing III, MD;  Location: [facility];  Service: General;  Laterality: Right;  . PR DURAL GRAFT REPAIR,SPINE DEFECT N/A 07/13/2016   Procedure: Marshia Ly STERIOTACTIC BIOPSY WITH STEALTH;  Surgeon: Kevan Ny Ditty, MD;  Location: [facility];  Service: Neurosurgery;  Laterality: N/A;    OB History  No data available       Home Medications    Prior to Admission medications   Medication Sig Start Date End Date Taking? Authorizing Provider  acetaminophen (TYLENOL) 500 MG tablet Take 1,000 mg by mouth at bedtime as needed for mild pain or headache.    Yes [provider]  anastrozole (ARIMIDEX) 1 MG tablet TAKE 1 TABLET (1 MG TOTAL) BY MOUTH DAILY. 08/20/16  Yes Nicholas Lose, MD  Cholecalciferol (VITAMIN D3) 2000 units capsule Take 1 capsule (2,000 Units total) by mouth daily. 02/03/17   Yes [name], [name], MD  cyanocobalamin (,VITAMIN B-12,) 1000 MCG/ML injection INJECT 1 ML SUBCUTANEOUSLY EVERY 30 DAYS Patient taking differently: INJECT 1 ML (1000 mcg) SUBCUTANEOUSLY EVERY 30 DAYS 02/08/17  Yes [name], [name], MD  dexamethasone (DECADRON) 6 MG tablet Take 1 tablet (6 mg total) by mouth 2 (two) times daily. 01/08/17 01/08/18 Yes Thurnell Lose, MD  lapatinib (TYKERB) 250 MG tablet Take 3 tablets (750 mg total) by mouth daily. 03/24/17  Yes Nicholas Lose, MD  levETIRAcetam (KEPPRA) 1000 MG tablet Take 1 tablet (1,000 mg total) by mouth 2 (two) times daily. 01/08/17  Yes Thurnell Lose, MD  LORazepam (ATIVAN) 0.5 MG tablet TAKE 1 TABLET BY MOUTH 3 TIMES A DAY AS NEEDED FOR ANXIETY Patient taking differently: TAKE 1 TABLET (0.5mg ) BY MOUTH 3 TIMES A DAY AS NEEDED FOR ANXIETY 03/29/17  Yes [name], [name], MD  losartan-hydrochlorothiazide (HYZAAR) 100-25 MG tablet TAKE 1 TABLET BY MOUTH EVERY DAY 03/15/17  Yes [name], [name], MD  magnesium gluconate (MAGONATE) 500 MG tablet Take 1 tablet (500 mg total) by mouth 2 (two) times daily. 02/02/17  Yes [name], [name], MD  metoprolol succinate (TOPROL-XL) 50 MG 24 hr tablet TAKE 1/2 TABLET (25 MG TOTAL) BY MOUTH EVERY DAY 03/29/17  Yes [name], [name], MD  omeprazole (PRILOSEC) 20 MG capsule Take 20 mg by mouth daily.   Yes [provider]  ondansetron (ZOFRAN) 8 MG tablet Take 1 tablet (8 mg total) by mouth every 8 (eight) hours as needed for nausea or vomiting. 04/02/17  Yes Causey, Charlestine Massed, NP  potassium chloride (MICRO-K) 10 MEQ CR capsule TAKE 1 [place] DAY Patient not taking: Reported on 04/17/2017 03/15/17   [name], [name], MD    Family History Family History  Problem Relation Age of Onset  . Heart disease Mother        CAD  . COPD Mother   . Heart disease Father 72       leaky valve  . Parkinsonism Father   . Hypertension Other     Social History Social History    Substance Use Topics  . Smoking status: Never Smoker  . Smokeless tobacco: Never Used  . Alcohol use Yes     Comment: 05/30/2015 "I might take a drink of wine 1-2 times/yr"     Allergies   Penicillins and Sulfonamide derivatives   Review of Systems Review of Systems  Constitutional: Negative for fever.  HENT: Negative for congestion.   Respiratory: Negative for shortness of breath.   Cardiovascular: Negative for chest pain.  Gastrointestinal: Negative for abdominal pain.  Genitourinary: Negative for dysuria.  Musculoskeletal: Negative for back pain.  Neurological: Positive for facial asymmetry and headaches.  Hematological: Does not bruise/bleed easily.  Psychiatric/Behavioral: Negative for confusion.     Physical Exam Updated Vital Signs BP (!) 157/88 (BP Location: Right Arm)   Pulse 79   Temp  98.8 F (37.1 C) (Oral)   Resp 20   SpO2 98%   Physical Exam  Constitutional: She is oriented to person, place, and time. She appears well-developed and well-nourished. No distress.  HENT:  Head: Normocephalic and atraumatic.  Mouth/Throat: Oropharynx is clear and moist.  Eyes: Pupils are equal, round, and reactive to light. Conjunctivae and EOM are normal.  Neck: Normal range of motion. Neck supple.  Cardiovascular: Normal rate, regular rhythm and normal heart sounds.   Pulmonary/Chest: Effort normal and breath sounds normal.  Left mastectomy.  Abdominal: Soft. Bowel sounds are normal.  Musculoskeletal: Normal range of motion.  Some redness to the left forearm. No increased warmth.  Neurological: She is alert and oriented to person, place, and time. A cranial nerve deficit is present.  Patient with left facial droop and less facial twitching. But no upper extremity and lower show any motor dysfunction. Tongue works fine. Patient mentating fine.  Nursing note and vitals reviewed.    ED Treatments / Results  Labs (all labs ordered are listed, but only abnormal results  are displayed) Labs Reviewed  APTT - Abnormal; Notable for the following:       Result Value   aPTT 23 (*)    All other components within normal limits  COMPREHENSIVE METABOLIC PANEL - Abnormal; Notable for the following:    Potassium 3.1 (*)    Chloride 100 (*)    Glucose, Bld 132 (*)    Creatinine, Ser 1.30 (*)    Calcium 8.4 (*)    Total Protein 5.5 (*)    Albumin 2.5 (*)    ALT 10 (*)    GFR calc non Af Amer 40 (*)    GFR calc Af Amer 46 (*)    All other components within normal limits  CBG MONITORING, ED - Abnormal; Notable for the following:    Glucose-Capillary 134 (*)    All other components within normal limits  I-STAT CHEM 8, ED - Abnormal; Notable for the following:    Potassium 3.0 (*)    Chloride 98 (*)    Creatinine, Ser 1.10 (*)    Glucose, Bld 133 (*)    Calcium, Ion 1.08 (*)    Hemoglobin 11.9 (*)    HCT 35.0 (*)    All other components within normal limits  PROTIME-INR  CBC WITH DIFFERENTIAL/PLATELET  MAGNESIUM  I-STAT TROPONIN, ED    EKG  EKG Interpretation  Date/Time:  Saturday April 17 2017 14:25:46 EDT Ventricular Rate:  92 PR Interval:    QRS Duration: 111 QT Interval:  306 QTC Calculation: 344 R Axis:   14 Text Interpretation:  Atrial fibrillation Ventricular premature complex Probable LVH with secondary repol abnrm Artifact in lead(s) I II III aVR aVL aVF V1 V2 V3 V4 V5 V6 ? artifact Confirmed by Fredia Sorrow [phone]) on 04/17/2017 2:50:39 PM       Radiology Ct Head Code Stroke Wo Contrast  Result Date: 04/17/2017 CLINICAL DATA:  Code stroke. Altered level of consciousness. Right sided weakness and slurred speech. History of breast cancer metastatic to brain EXAM: CT HEAD WITHOUT CONTRAST TECHNIQUE: Contiguous axial images were obtained from the base of the skull through the vertex without intravenous contrast. COMPARISON:  MRI head 03/21/2017.  CT 01/06/2017 FINDINGS: Brain: Postop right temporal craniotomy for tumor resection.  Hypodensity in the right temporal lobe similar to prior studies. No significant mass-effect on the temporal horn. No shift of the midline structures. Small metastatic deposit left parietal lobe noted  on MRI and not seen on CT Negative for hemorrhage.  No acute infarct.  Ventricle size normal Vascular: Negative for hyperdense vessel Skull: Right temporal craniotomy.  No skull lesion identified Sinuses/Orbits: Negative Other: Negative ASPECTS ([place] Stroke Program Early CT Score) - Ganglionic level infarction (caudate, lentiform nuclei, internal capsule, insula, M1-M3 cortex): 7 - Supraganglionic infarction (M4-M6 cortex): 3 Total score (0-10 with 10 being normal): 10 IMPRESSION: 1. No acute abnormality. Tumor resection and treatment in the right temporal lobe appear stable. 2. ASPECTS is 10 These results were called by telephone at the time of interpretation on 04/17/2017 at 2:34 pm to Dr. Rory Percy, who verbally acknowledged these results. Electronically Signed   By: Franchot Gallo M.D.   On: 04/17/2017 14:35    Procedures Procedures (including critical care time)   CRITICAL CARE Performed by: Fredia Sorrow Total critical care time: 30 minutes Critical care time was exclusive of separately billable procedures and treating other patients. Critical care was necessary to treat or prevent imminent or life-threatening deterioration. Critical care was time spent personally by me on the following activities: development of treatment plan with patient and/or surrogate as well as nursing, discussions with consultants, evaluation of patient's response to treatment, examination of patient, obtaining history from patient or surrogate, ordering and performing treatments and interventions, ordering and review of laboratory studies, ordering and review of radiographic studies, pulse oximetry and re-evaluation of patient's condition.   Medications Ordered in ED Medications  LORazepam (ATIVAN) bolus via infusion 1 mg  (1 mg Intravenous Bolus from Bag 04/17/17 1434)  LORazepam (ATIVAN) 2 MG/ML injection (not administered)  sodium chloride 0.9 % 1,000 mL with potassium chloride 40 mEq infusion (not administered)  levETIRAcetam (KEPPRA) 1,000 mg in sodium chloride 0.9 % 100 mL IVPB (1,000 mg Intravenous New Bag/Given 04/17/17 1455)     Initial Impression / Assessment and Plan / ED Course  I have reviewed the triage vital signs and the nursing notes.  Pertinent labs & imaging results that were available during my care of the patient were reviewed by me and considered in my medical decision making (see chart for details).     Patient arrived as a code stroke. Seen by neuro hospitalist team. Patient has a history of metastatic breast cancer to the brain. Patient's symptoms included a left facial droop with twitching of the left side of face. No other real focal deficits. Arms and legs all functioning fine. Low bit of numbness to the left arm. Patient does have a little bit of redness that left arm as well. But no signs of any infectious process clinically. CBC is still pending. Patient symptoms felt to be due to the brain metastases and to be focal seizure as per neuro hospitalist. The recommended 1 g of Keppra which was given IV. And then Ativan was ordered a when necessary basis. Twitching is slowing down but has not resolved. Patient mentating fine.  Discussed with hospitalist they will admit the neurologist will follow.  Final Clinical Impressions(s) / ED Diagnoses   Final diagnoses:  Seizure [facility])  Primary malignant neoplasm of left breast with metastasis to other site [facility])    New Prescriptions New Prescriptions   No medications on file     Fredia Sorrow, MD 04/17/17 [phone]

## 2017-04-17 NOTE — Consult Note (Addendum)
Neurology Consultation  Reason for Consult: Acute code stroke Referring Physician: Dr. Bobby Rumpf  CC: Left-sided weakness and twitching  History is obtained from: Patient, EMS, chart, medical power of attorney was at bedside  HPI: Savannah Howard is a 72 y.o. 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 The patient was in her usual state of health until about 1 PM when she started having this weakness and facial twitching. Patient was at her oncologist appointment yesterday. Her chart documents that the patient has not been compliant to any of her medications as multiple bottles of her Ativan, Keppra, Zofran and other medications have been found in her living room by her healthcare proxy. She was advised to comply with her medications. Medical power of attorney things that she has not been taking her seizure medications for a while now, but she always gets her prescriptions refilled.  LKW: 1300 hrs. tpa given?: no, seizure, less likely stroke Premorbid modified Rankin scale (mRS):  0-Completely asymptomatic and back to baseline post-stroke  ROS: Unable to obtain due to altered mental status.   Past Medical History:  Diagnosis Date  . Allergy    rhinitis  . Anemia   . Anxiety   . Brain cancer ([employer]) 06/2016   breast ca with mets to brain  . DDD (degenerative disc disease), lumbar dx'd in 1980's  . GERD (gastroesophageal reflux disease)   . Heart murmur   . History of blood transfusion 2016 X 3 (05/30/2015)  . Hypertension   . Kidney stone 1990's X 1   "passed it"  . Obesity   . 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 Vitamin B 12 deficiency   . Vitamin D deficiency     Family History   Problem Relation Age of Onset  . Heart disease Mother        CAD  . COPD Mother   . Heart disease Father 4       leaky valve  . Parkinsonism Father   . Hypertension Other     Social History:   reports that she has never smoked. She has never used smokeless tobacco. She reports that she drinks alcohol. She reports that she does not use drugs.  Medications  Current Facility-Administered Medications:  .  iopamidol (ISOVUE-370) 76 % injection, , , ,  .  levETIRAcetam (KEPPRA) 1,000 mg in sodium chloride 0.9 % 100 mL IVPB, 1,000 mg, Intravenous, Once, Amie Portland, MD .  LORazepam (ATIVAN) bolus via infusion 1 mg, 1 mg, Intravenous, Q30 min PRN, Amie Portland, MD  Current Outpatient Prescriptions:  .  acetaminophen (TYLENOL) 500 MG tablet, Take 1,000 mg by mouth at bedtime as needed for mild pain or headache. , Disp: , Rfl:  .  anastrozole (ARIMIDEX) 1 MG tablet, TAKE 1 TABLET (1 MG TOTAL) BY MOUTH DAILY., Disp: 90 tablet, Rfl: 2 .  Cholecalciferol (VITAMIN D3) 2000 units capsule, Take 1 capsule (2,000 Units total) by mouth daily., Disp: 100 capsule, Rfl: 3 .  cyanocobalamin (,VITAMIN B-12,) 1000 MCG/ML injection, INJECT 1 ML SUBCUTANEOUSLY EVERY 30 DAYS, Disp: 1 mL, Rfl: 3 .  dexamethasone (DECADRON) 6 MG tablet, Take 1 tablet (6 mg total) by mouth 2 (two) times daily., Disp: 60 tablet, Rfl:  0 .  lapatinib (TYKERB) 250 MG tablet, Take 3 tablets (750 mg total) by mouth daily., Disp: 90 tablet, Rfl: 3 .  levETIRAcetam (KEPPRA) 1000 MG tablet, Take 1 tablet (1,000 mg total) by mouth 2 (two) times daily., Disp: 60 tablet, Rfl: 1 .  LORazepam (ATIVAN) 0.5 MG tablet, TAKE 1 TABLET BY MOUTH 3 TIMES A DAY AS NEEDED FOR ANXIETY, Disp: 90 tablet, Rfl: 3 .  losartan-hydrochlorothiazide (HYZAAR) 100-25 MG tablet, TAKE 1 TABLET BY MOUTH EVERY DAY, Disp: 90 tablet, Rfl: 2 .  magnesium gluconate (MAGONATE) 500 MG tablet, Take 1 tablet (500 mg total) by mouth 2 (two) times daily., Disp: 60 tablet, Rfl:  11 .  metoprolol succinate (TOPROL-XL) 50 MG 24 hr tablet, TAKE 1/2 TABLET (25 MG TOTAL) BY MOUTH EVERY DAY, Disp: 15 tablet, Rfl: 3 .  omeprazole (PRILOSEC) 20 MG capsule, Take 20 mg by mouth daily., Disp: , Rfl:  .  ondansetron (ZOFRAN) 8 MG tablet, Take 1 tablet (8 mg total) by mouth every 8 (eight) hours as needed for nausea or vomiting., Disp: 20 tablet, Rfl: 0 .  potassium chloride (MICRO-K) 10 MEQ CR capsule, TAKE 1 CAPSULE BY MOUTH EVERY DAY, Disp: 90 capsule, Rfl: 2 .  terazosin (HYTRIN) 5 MG capsule, Take 5 mg by mouth at bedtime. Do not crush, Disp: , Rfl:   Exam: Current vital signs: BP (!) 154/79 (BP Location: Right Arm)   Pulse (!) 111   Temp 98.8 F (37.1 C) (Oral)   Resp 20   SpO2 100%  Vital signs in last 24 hours: Temp:  [98.8 F (37.1 C)] 98.8 F (37.1 C) (09/08 1424) Pulse Rate:  [111] 111 (09/08 1424) Resp:  [20] 20 (09/08 1424) BP: (154)/(79) 154/79 (09/08 1424) SpO2:  [100 %] 100 % (09/08 1424)  GENERAL: Awake, alert in NAD HEENT: - Normocephalic and atraumatic, dry mm, no LN++, no Thyromegally, scalp alopecia LUNGS - Clear to auscultation bilaterally with no wheezes CV - S1S2 RRR, no m/r/g, equal pulses bilaterally. ABDOMEN - Soft, nontender, nondistended with normoactive BS Ext: warm, well perfused, intact peripheral pulses, no edema  NEURO:  Mental Status: AA&Ox3  Language: speech is severely dysarthric  Naming, repetition, fluency, and comprehension intact. Cranial Nerves: PERRL 38mm/brisk. EOMI, visual fields full, left nasolabial fold is flattened facial sensation intact, hearing intact, tongue/uvula/soft palate midline, normal sternocleidomastoid and trapezius muscle strength. No evidence of tongue atrophy or fibrillations Motor: She has a coarse tremor on outstretched hands and legs, it is not asterixis. No drift in all 4 extremities but left upper and lower extremities 4+/5 compared to the right side which is 5/5. She does have rhythmic twitching of  the left face throughout the time of the encounter. Tone: is normal and bulk is normal Sensation- Intact to light touch bilaterally Coordination: FTN intact bilaterally, no ataxia in BLE. Gait- deferred  NIHSS 1a Level of Conscious.: 0 1b LOC Questions: 0 1c LOC Commands: 0 2 Best Gaze: 0 3 Visual: 0 4 Facial Palsy: 1 5a Motor Arm - left: 0 5b Motor Arm - Right: 0 6a Motor Leg - Left: 0 6b Motor Leg - Right: 0 7 Limb Ataxia: 0 8 Sensory: 0 9 Best Language: 0 10 Dysarthria: 2 11 Extinct. and Inatten.: 0 TOTAL: 3  Labs I have reviewed labs in epic and the results pertinent to this consultation are:  CBC    Component Value Date/Time   WBC 10.2 04/16/2017 0951   WBC 9.2 01/06/2017 1553   RBC  3.55 (L) 04/16/2017 0951   RBC 4.08 01/06/2017 1553   HGB 11.9 (L) 04/17/2017 1423   HGB 12.3 04/16/2017 0951   HCT 35.0 (L) 04/17/2017 1423   HCT 35.8 04/16/2017 0951   PLT 365 04/16/2017 0951   MCV 100.8 04/16/2017 0951   MCH 34.8 (H) 04/16/2017 0951   MCH 32.8 01/06/2017 1553   MCHC 34.5 04/16/2017 0951   MCHC 33.4 01/06/2017 1553   RDW 16.0 (H) 04/16/2017 0951   LYMPHSABS 2.2 04/16/2017 0951   MONOABS 0.9 04/16/2017 0951   EOSABS 0.0 04/16/2017 0951   BASOSABS 0.1 04/16/2017 0951    CMP     Component Value Date/Time   NA 138 04/17/2017 1423   NA 137 04/16/2017 0951   K 3.0 (L) 04/17/2017 1423   K 2.7 (LL) 04/16/2017 0951   CL 98 (L) 04/17/2017 1423   CO2 22 04/16/2017 0951   GLUCOSE 133 (H) 04/17/2017 1423   GLUCOSE 99 04/16/2017 0951   GLUCOSE 112 (H) 07/15/2006 0921   BUN 17 04/17/2017 1423   BUN 22.1 04/16/2017 0951   CREATININE 1.10 (H) 04/17/2017 1423   CREATININE 1.8 (H) 04/16/2017 0951   CALCIUM 9.2 04/16/2017 0951   PROT 6.3 (L) 04/16/2017 0951   ALBUMIN 2.6 (L) 04/16/2017 0951   AST 11 04/16/2017 0951   ALT <6 04/16/2017 0951   ALKPHOS 62 04/16/2017 0951   BILITOT 0.74 04/16/2017 0951   GFRNONAA >60 01/07/2017 0758   GFRAA >60 01/07/2017 0758     Imaging I have reviewed the images obtained:  CT-scan of the brain - no acute changes. Right craniotomy and right temporoparietal area of encephalomalacia, consistent with the prior brain metastases. MRI of the brain-from August 2018 and prior reviewed. Multiple metastases to the brain, the biggest one being in the right temporal area withenhancement. Most recent MRI shows the right temporal metastases to be smaller in comparison to the prior scan, but still enhancing.  Assessment:  71 year old with a metastatic breast cancer, noncompliant to medications, history of seizures because of brain metastases presented as an acute code stroke for left-sided weakness and dysarthria. Her speech is severely dysarthric. She is subtly weak on the left side. Her presentation is more consistent with a simple partial seizure rather than a stroke. No TPA was offered for the above-stated reason. There is no evidence of large vessel occlusion on exam and start an endovascular candidate.  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 Less likely stroke Breast cancer Metastatic breast cancer to the brain Anxiety Medication noncompliance  Recommendations: Ativan 1 mg 1. Can use another dose of Ativan if needed. Loaded with Keppra 1 g IV. Continue with Keppra 1 g twice a day (IV until she passes swallow, then converted to by mouth after that) EEG MRI with and without contrast Seizure precautions Consider stroke workup if MRI is positive for stroke. We will continue to follow Palliative care should also be consulted.  ADDENDUM Patient received 1 dose of Ativan and Keppra 1 g IV. Continues to have some twitching of her mouth. I would recommend loading with Vimpat 200 mg IV and then continuing Vimpat 100 twice a day. Dexamethasone 4q6h. Consider Oncology consult as well. Neurology will reevaluate and consider obtaining  overnight video EEG.  Amie Portland, MD [facility] [phone]  If 7pm to 7am, please call on call as listed on AMION  Critical care attestation This patient is critically ill and at significant  risk of neurological worsening, death and care requires constant monitoring of vital signs, hemodynamics,respiratory and cardiac monitoring, neurological assessment, discussion with family, other specialists and medical decision making of high complexity. I spent 40  minutes of neurocritical care time  in the care of  this patient.

## 2017-04-17 NOTE — H&P (Signed)
History and Physical    Savannah Howard HCW:[id number] DOB: 09-07-1945 DOA: 04/17/2017  PCP: [name], MD  Patient coming from: Home--lives in motel.  I have personally briefly reviewed patient's old medical records in [place]  Chief Complaint: Left-sided weakness and facial twitching.  HPI: Savannah Howard is a 72 y.o. female with medical history significant of metastatic breast cancer with brain metastases, medical noncompliance, history of seizure disorder, hypertension, degenerative disc disease, gastroesophageal reflux disease  who presents to the ED via EMS for evaluation of acute onset of left-sided weakness and left facial twitch. Patient with significant dysarthric speech and a such history was also obtained from patient's health Of attorney who was at bedside. Healthcare power of attorney patient was seen at the foot of the bed after eating lunch and while talking suddenly had left facial twitching and drooling with left upper extremity numbness and left-sided weakness. Patient was also noted to have significantly slurred speech. Twitching was ongoing. EMS was called and patient was brought to the ED. Symptoms started around 1 PM on the day of admission. Patient does endorse a headache one day prior to admission which resolved with Tylenol. Patient does endorse some  left-sided weakness, strong smell to the urine. Patient denies any fever, no chills, no nausea, no emesis, no chest pain, shortness of breath, no abdominal pain, no constipation, no dysuria, no productive cough. Patient does endorse chemotherapy-induced diarrhea which has since resolved. Patient actively twitching during examination.  ED Course: Patient was seen in the ED brought in as a code stroke. Comprehensive metabolic profile obtain had a potassium of 3.1, creatinine of 1.30 otherwise was within normal limits. Point-of-care troponin was 0.01. H&H was 11.9. CT head obtained showed tumor resection and  treatment in the right temporal lobe has been stable. No acute abnormality. Patient was seen in consultation by neurology and patient was loaded with Keppra 1 g IV 1 as well as given Ativan 1 mg 1. Patient was placed on seizure precautions. It was felt patient likely had a seizure. [facility] were called to admit the patient.   Review of Systems: As per HPI otherwise 10 point review of systems negative.   Past Medical History:  Diagnosis Date  . Allergy    rhinitis  . Anemia   . Anxiety   . Brain cancer ([place]) 06/2016   breast ca with mets to brain  . DDD (degenerative disc disease), lumbar dx'd in 1980's  . GERD (gastroesophageal reflux disease)   . Heart murmur   . History of blood transfusion 2016 X 3 (05/30/2015)  . Hypertension   . Kidney stone 1990's X 1   "passed it"  . Obesity   . 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 Vitamin B 12 deficiency   . Vitamin D deficiency     Past Surgical History:  Procedure Laterality Date  . APPLICATION OF CRANIAL NAVIGATION N/A 07/13/2016   Procedure: APPLICATION OF CRANIAL NAVIGATION;  Surgeon: Kevan Ny Ditty, MD;  Location: [place];  Service: Neurosurgery;  Laterality: N/A;  . BREAST BIOPSY Left ~ 12/2014  . CHOLECYSTECTOMY OPEN  ~ 1975  . ESOPHAGEAL DILATION  X 3  . MASTECTOMY COMPLETE / SIMPLE W/ SENTINEL NODE BIOPSY Left 05/30/2015  . MASTECTOMY W/ SENTINEL NODE BIOPSY Left 05/30/2015   Procedure: LEFT MASTECTOMY WITH SENTINEL LYMPH NODE BIOPSY;  Surgeon: Autumn Messing III, MD;  Location: [place];  Service: General;  Laterality:  Left;  . PORTACATH PLACEMENT Right 12/26/2014   Procedure: INSERTION PORT-A-CATH;  Surgeon: Autumn Messing III, MD;  Location: [place];  Service: General;  Laterality: Right;  . PR DURAL GRAFT REPAIR,SPINE DEFECT N/A 07/13/2016   Procedure: Marshia Ly STERIOTACTIC BIOPSY WITH STEALTH;  Surgeon: Kevan Ny Ditty, MD;  Location: [place];  Service:  Neurosurgery;  Laterality: N/A;     reports that she has never smoked. She has never used smokeless tobacco. She reports that she drinks alcohol. She reports that she does not use drugs.  Allergies  Allergen Reactions  . Penicillins Swelling and Other (See Comments)    Genital swelling/skin peeled/yeast infection Has patient had a PCN reaction causing immediate rash, facial/tongue/throat swelling, SOB or lightheadedness with hypotension: Yes Has patient had a PCN reaction causing severe rash involving mucus membranes or skin necrosis: Unknown Has patient had a PCN reaction that required hospitalization: Unknown Has patient had a PCN reaction occurring within the last 10 years: Unknown If all of the above answers are "NO", then may proceed with   . Sulfonamide Derivatives Other (See Comments)    Convulsions      Family History  Problem Relation Age of Onset  . Heart disease Mother        CAD  . COPD Mother   . Heart disease Father 18       leaky valve  . Parkinsonism Father   . Hypertension Other    Mother deceased age 15 from coronary artery disease/CHF.  father deceased cause unknown.   Prior to Admission medications   Medication Sig Start Date End Date Taking? Authorizing Provider  acetaminophen (TYLENOL) 500 MG tablet Take 1,000 mg by mouth at bedtime as needed for mild pain or headache.    Yes [provider]  anastrozole (ARIMIDEX) 1 MG tablet TAKE 1 TABLET (1 MG TOTAL) BY MOUTH DAILY. 08/20/16  Yes Nicholas Lose, MD  Cholecalciferol (VITAMIN D3) 2000 units capsule Take 1 capsule (2,000 Units total) by mouth daily. 02/03/17  Yes [name], [name], MD  cyanocobalamin (,VITAMIN B-12,) 1000 MCG/ML injection INJECT 1 ML SUBCUTANEOUSLY EVERY 30 DAYS Patient taking differently: INJECT 1 ML (1000 mcg) SUBCUTANEOUSLY EVERY 30 DAYS 02/08/17  Yes [name], [name], MD  dexamethasone (DECADRON) 6 MG tablet Take 1 tablet (6 mg total) by mouth 2 (two) times daily. 01/08/17  01/08/18 Yes Thurnell Lose, MD  lapatinib (TYKERB) 250 MG tablet Take 3 tablets (750 mg total) by mouth daily. 03/24/17  Yes Nicholas Lose, MD  levETIRAcetam (KEPPRA) 1000 MG tablet Take 1 tablet (1,000 mg total) by mouth 2 (two) times daily. 01/08/17  Yes Thurnell Lose, MD  LORazepam (ATIVAN) 0.5 MG tablet TAKE 1 TABLET BY MOUTH 3 TIMES A DAY AS NEEDED FOR ANXIETY Patient taking differently: TAKE 1 TABLET (0.5mg ) BY MOUTH 3 TIMES A DAY AS NEEDED FOR ANXIETY 03/29/17  Yes [name], [name], MD  losartan-hydrochlorothiazide (HYZAAR) 100-25 MG tablet TAKE 1 TABLET BY MOUTH EVERY DAY 03/15/17  Yes [name], [name], MD  magnesium gluconate (MAGONATE) 500 MG tablet Take 1 tablet (500 mg total) by mouth 2 (two) times daily. 02/02/17  Yes [name], [name], MD  metoprolol succinate (TOPROL-XL) 50 MG 24 hr tablet TAKE 1/2 TABLET (25 MG TOTAL) BY MOUTH EVERY DAY 03/29/17  Yes [name], [name], MD  omeprazole (PRILOSEC) 20 MG capsule Take 20 mg by mouth daily.   Yes [provider]  ondansetron (ZOFRAN) 8 MG tablet Take 1 tablet (8  mg total) by mouth every 8 (eight) hours as needed for nausea or vomiting. 04/02/17  Yes Causey, Charlestine Massed, NP  potassium chloride (MICRO-K) 10 MEQ CR capsule TAKE 1 CAPSULE BY MOUTH EVERY DAY Patient not taking: Reported on 04/17/2017 03/15/17   [name], MD    Physical Exam: Vitals:   04/17/17 1445 04/17/17 1459 04/17/17 1545 04/17/17 1600  BP: (!) 157/88 (!) 157/88 116/84 108/71  Pulse: 80 79 66 75  Resp: (!) 21 20 20 15   Temp:      TempSrc:      SpO2: 99% 98% 99% 100%    Constitutional: [employer] face. Alopecia. Left facial twitching. Dysarthric speech. Vitals:   04/17/17 1445 04/17/17 1459 04/17/17 1545 04/17/17 1600  BP: (!) 157/88 (!) 157/88 116/84 108/71  Pulse: 80 79 66 75  Resp: (!) 21 20 20 15   Temp:      TempSrc:      SpO2: 99% 98% 99% 100%   Eyes: PERRLA,EOMI, lids and conjunctivae normal ENMT: Mucous membranes are  dry. Posterior pharynx clear of any exudate or lesions. Poor dentition. No biting of tongue.  Neck: normal, supple, no masses, no thyromegaly Respiratory: clear to auscultation bilaterally, no wheezing, no crackles. Normal respiratory effort. No accessory muscle use.  Cardiovascular: Regular rate and rhythm, no murmurs / rubs / gallops. No extremity edema. 2+ pedal pulses. No carotid bruits.  Abdomen: no tenderness, no masses palpated. No hepatosplenomegaly. Bowel sounds positive.  Musculoskeletal: no clubbing / cyanosis. No joint deformity upper and lower extremities. Good ROM, no contractures. Normal muscle tone.  Skin: no rashes, lesions, ulcers. No induration Neurologic:  Patient alert. Patient with left facial twitching. Left facial weakness. Dysarthric speech. Unable to elicit reflexes symmetrically and diffusely. 5/5 right sided strength. 3-4/5 left-sided strength.  Psychiatric: Normal judgment and insight. Alert and oriented x 3. Normal mood.   Labs on Admission: I have personally reviewed following labs and imaging studies  CBC:  Recent Labs Lab 04/16/17 0951 04/17/17 1423  WBC 10.2  --   NEUTROABS 7.0*  --   HGB 12.3 11.9*  HCT 35.8 35.0*  MCV 100.8  --   PLT 365  --    Basic Metabolic Panel:  Recent Labs Lab 04/16/17 0951 04/17/17 1412 04/17/17 1423  NA 137 137 138  K 2.7* 3.1* 3.0*  CL  --  100* 98*  CO2 22 24  --   GLUCOSE 99 132* 133*  BUN 22.1 15 17   CREATININE 1.8* 1.30* 1.10*  CALCIUM 9.2 8.4*  --    GFR: Estimated Creatinine Clearance: 47 mL/min (A) (by C-G formula based on SCr of 1.1 mg/dL (H)). Liver Function Tests:  Recent Labs Lab 04/16/17 0951 04/17/17 1412  AST 11 21  ALT <6 10*  ALKPHOS 62 49  BILITOT 0.74 1.0  PROT 6.3* 5.5*  ALBUMIN 2.6* 2.5*   No results for input(s): LIPASE, AMYLASE in the last 168 hours. No results for input(s): AMMONIA in the last 168 hours. Coagulation Profile:  Recent Labs Lab 04/17/17 1412  INR 0.99    Cardiac Enzymes: No results for input(s): CKTOTAL, CKMB, CKMBINDEX, TROPONINI in the last 168 hours. BNP (last 3 results) No results for input(s): PROBNP in the last 8760 hours. HbA1C: No results for input(s): HGBA1C in the last 72 hours. CBG:  Recent Labs Lab 04/17/17 1402  GLUCAP 134*   Lipid Profile: No results for input(s): CHOL, HDL, LDLCALC, TRIG, CHOLHDL, LDLDIRECT in the last 72 hours. Thyroid Function  Tests: No results for input(s): TSH, T4TOTAL, FREET4, T3FREE, THYROIDAB in the last 72 hours. Anemia Panel: No results for input(s): VITAMINB12, FOLATE, FERRITIN, TIBC, IRON, RETICCTPCT in the last 72 hours. Urine analysis:    Component Value Date/Time   COLORURINE YELLOW 01/06/2017 1749   APPEARANCEUR HAZY (A) 01/06/2017 1749   LABSPEC 1.025 01/06/2017 1749   PHURINE 5.0 01/06/2017 1749   GLUCOSEU NEGATIVE 01/06/2017 1749   GLUCOSEU NEGATIVE 11/05/2014 1456   [place] 01/06/2017 1749   BILIRUBINUR NEGATIVE 01/06/2017 1749   [place] 01/06/2017 1749   PROTEINUR NEGATIVE 01/06/2017 1749   UROBILINOGEN 0.2 11/05/2014 1456   NITRITE NEGATIVE 01/06/2017 1749   LEUKOCYTESUR SMALL (A) 01/06/2017 1749    Radiological Exams on Admission: Ct Head Code Stroke Wo Contrast  Result Date: 04/17/2017 CLINICAL DATA:  Code stroke. Altered level of consciousness. Right sided weakness and slurred speech. History of breast cancer metastatic to brain EXAM: CT HEAD WITHOUT CONTRAST TECHNIQUE: Contiguous axial images were obtained from the base of the skull through the vertex without intravenous contrast. COMPARISON:  MRI head 03/21/2017.  CT 01/06/2017 FINDINGS: Brain: Postop right temporal craniotomy for tumor resection. Hypodensity in the right temporal lobe similar to prior studies. No significant mass-effect on the temporal horn. No shift of the midline structures. Small metastatic deposit left parietal lobe noted on MRI and not seen on CT Negative for hemorrhage.  No  acute infarct.  Ventricle size normal Vascular: Negative for hyperdense vessel Skull: Right temporal craniotomy.  No skull lesion identified Sinuses/Orbits: Negative Other: Negative ASPECTS ([place] Stroke Program Early CT Score) - Ganglionic level infarction (caudate, lentiform nuclei, internal capsule, insula, M1-M3 cortex): 7 - Supraganglionic infarction (M4-M6 cortex): 3 Total score (0-10 with 10 being normal): 10 IMPRESSION: 1. No acute abnormality. Tumor resection and treatment in the right temporal lobe appear stable. 2. ASPECTS is 10 These results were called by telephone at the time of interpretation on 04/17/2017 at 2:34 pm to Dr. Rory Percy, who verbally acknowledged these results. Electronically Signed   By: Franchot Gallo M.D.   On: 04/17/2017 14:35    EKG: Independently reviewed. Tachycardia with artifact. Repeat EKG.   Assessment/Plan Principal Problem:   Seizures ([place]) Active Problems:   B12 deficiency   Vitamin D deficiency   Anxiety   GERD   HYPERGLYCEMIA   Hypertension   Dyslipidemia   Breast cancer of upper-outer quadrant of left female breast ([place])   Dehydration   Brain tumor ([place])   Breast cancer metastasized to brain ([place])   Hypokalemia    #1 probable seizures Patient with history of metastatic breast cancer to the brain. Patient status post right craniotomy range right temporoparietal encephalomalacia. CT head with no acute abnormalities. MRI of brain 03/21/2017 with into full decrease in size of dominant lesion involving the right superior temporal gyrus with decreased surrounding edema. Multiple other dural based lesions have decreased in size. Stable left parietal lesion measuring up to 7.5 mm. No new lesions. Patient also with medical noncompliance as noted per her oncologist note from 04/16/2017. Patient still with left facial twitching which is slowly improving. Patient with significant dysarthric speech. Will admit patient to the step down unit. Patient is in  consultation by neurology. Patient has received a loading dose of IV Keppra 1 g 1 as well as Ativan 1 mg IV 1. Patient still with twitching. Neurology recommended EEG, MRI with and without contrast, seizure precautions. Per neurology for MRI is positive for an acute CVA will need a  full stroke workup at that time. Patient is also being placed on Vimpat 200 mg IV 1 and then fainted 100 mg twice a day. Patient also been started on Decadron per neurology. Neurology following and appreciate input and recommendations.  #2 metastatic breast cancer to the brain Patient presented with seizures likely secondary to metastatic breast cancer to the brain. Will inform patient's oncologist via Epic of patient's admission.  #3 dehydration IV fluids.  #4 hypokalemia Check a magnesium level. Replete.  #5 dysarthria Likely secondary to problems #1 and 2. MRI head pending. Speech therapy evaluation. Keep patient nothing by mouth until patient has been assessed by speech therapy.  #6 gastroesophageal reflux disease PPI.  #7 anxiety Patient on Ativan when necessary.   DVT prophylaxis: Lovenox Code Status: DO NOT RESUSCITATE Family Communication: Updated health care power of attorney at bedside. Disposition Plan: Pending workup. Consults called: Neurology: Dr Rory Percy Admission status: Admit to inpatient/ SDU.   [facility] MD [facility] Pager [phone]  If 7PM-7AM, please contact night-coverage [email or web address] Password [facility]  04/17/2017, 4:46 PM

## 2017-04-17 NOTE — ED Notes (Signed)
Attempted report x 1 - no answer on unit

## 2017-04-17 NOTE — ED Notes (Signed)
Got patient undress on the monitor did ekg shown to er doctor patient is resting with call bell in reach with family at bedside

## 2017-04-18 ENCOUNTER — Inpatient Hospital Stay (HOSPITAL_COMMUNITY): Payer: Medicare Other

## 2017-04-18 DIAGNOSIS — G40109 Localization-related (focal) (partial) symptomatic epilepsy and epileptic syndromes with simple partial seizures, not intractable, without status epilepticus: Secondary | ICD-10-CM

## 2017-04-18 DIAGNOSIS — E860 Dehydration: Secondary | ICD-10-CM

## 2017-04-18 DIAGNOSIS — E559 Vitamin D deficiency, unspecified: Secondary | ICD-10-CM

## 2017-04-18 LAB — RAPID URINE DRUG SCREEN, HOSP PERFORMED
AMPHETAMINES: NOT DETECTED
BARBITURATES: NOT DETECTED
BENZODIAZEPINES: POSITIVE — AB
COCAINE: NOT DETECTED
Opiates: NOT DETECTED
Tetrahydrocannabinol: NOT DETECTED

## 2017-04-18 LAB — COMPREHENSIVE METABOLIC PANEL
ALK PHOS: 45 U/L (ref 38–126)
ALT: 9 U/L — ABNORMAL LOW (ref 14–54)
ANION GAP: 9 (ref 5–15)
AST: 11 U/L — ABNORMAL LOW (ref 15–41)
Albumin: 2.3 g/dL — ABNORMAL LOW (ref 3.5–5.0)
BUN: 13 mg/dL (ref 6–20)
CALCIUM: 8 mg/dL — AB (ref 8.9–10.3)
CHLORIDE: 104 mmol/L (ref 101–111)
CO2: 24 mmol/L (ref 22–32)
Creatinine, Ser: 0.95 mg/dL (ref 0.44–1.00)
GFR calc non Af Amer: 58 mL/min — ABNORMAL LOW (ref >60)
Glucose, Bld: 142 mg/dL — ABNORMAL HIGH (ref 65–99)
POTASSIUM: 4.2 mmol/L (ref 3.5–5.1)
Sodium: 137 mmol/L (ref 135–145)
Total Bilirubin: 0.7 mg/dL (ref 0.3–1.2)
Total Protein: 5.3 g/dL — ABNORMAL LOW (ref 6.5–8.1)

## 2017-04-18 LAB — CBC
HCT: 31.6 % — ABNORMAL LOW (ref 36.0–46.0)
HEMOGLOBIN: 10.5 g/dL — AB (ref 12.0–15.0)
MCH: 34 pg (ref 26.0–34.0)
MCHC: 33.2 g/dL (ref 30.0–36.0)
MCV: 102.3 fL — ABNORMAL HIGH (ref 78.0–100.0)
Platelets: 308 10*3/uL (ref 150–400)
RBC: 3.09 MIL/uL — AB (ref 3.87–5.11)
RDW: 15.1 % (ref 11.5–15.5)
WBC: 4.7 10*3/uL (ref 4.0–10.5)

## 2017-04-18 LAB — MAGNESIUM: MAGNESIUM: 1 mg/dL — AB (ref 1.7–2.4)

## 2017-04-18 MED ORDER — LOSARTAN POTASSIUM 50 MG PO TABS
50.0000 mg | ORAL_TABLET | Freq: Every day | ORAL | Status: DC
  Administered 2017-04-18 – 2017-04-19 (×2): 50 mg via ORAL
  Filled 2017-04-18 (×2): qty 1

## 2017-04-18 MED ORDER — GADOBENATE DIMEGLUMINE 529 MG/ML IV SOLN
20.0000 mL | Freq: Once | INTRAVENOUS | Status: AC
  Administered 2017-04-18: 20 mL via INTRAVENOUS

## 2017-04-18 MED ORDER — LORAZEPAM 0.5 MG PO TABS: 0.5000 mg | ORAL_TABLET | Freq: Three times a day (TID) | ORAL | Status: DC | PRN

## 2017-04-18 MED ORDER — SODIUM CHLORIDE 0.9% FLUSH
10.0000 mL | INTRAVENOUS | Status: DC | PRN
  Administered 2017-04-20 (×2): 10 mL
  Filled 2017-04-18 (×2): qty 40

## 2017-04-18 MED ORDER — CHLORHEXIDINE GLUCONATE CLOTH 2 % EX PADS
6.0000 | MEDICATED_PAD | Freq: Every day | CUTANEOUS | Status: DC
  Administered 2017-04-18: 6 via TOPICAL

## 2017-04-18 MED ORDER — MAGNESIUM SULFATE 4 GM/100ML IV SOLN
4.0000 g | Freq: Once | INTRAVENOUS | Status: AC
  Administered 2017-04-18: 4 g via INTRAVENOUS
  Filled 2017-04-18: qty 100

## 2017-04-18 NOTE — Plan of Care (Signed)
Problem: Education: Goal: Knowledge of [facility] Education information/materials will improve Outcome: Progressing Patient oriented to unit and hospital-updated on plan of care for tonight and going forward. Patient denying pain and states she feels back to normal for the most part. Patient discussed DNR status with me. Patient became tearful and might need some teaching regarding options.   Problem: Safety: Goal: Ability to remain free from injury will improve Outcome: Progressing Patient demonstrates no impulsive behavior. Bed alarm in use, call light within reach, and room uncluttered. Patient calls for assistance.   Problem: Physical Regulation: Goal: Ability to maintain clinical measurements within normal limits will improve Outcome: Progressing Patient no longer showing symptoms that brought her into hospital. Patient has received her anti-seizure medications. Continue to monitor- but patient states she knows she has improved.   Problem: Skin Integrity: Goal: Risk for impaired skin integrity will decrease Outcome: Progressing Patient able to turn self- encourage turns. Foam placed to bottom.

## 2017-04-18 NOTE — Evaluation (Signed)
Clinical/Bedside Swallow Evaluation Patient Details  Name: Savannah Howard MRN: [id number] Date of Birth: 1946/05/22  Today's Date: 04/18/2017 Time: SLP Start Time (ACUTE ONLY): 0825 SLP Stop Time (ACUTE ONLY): 0840 SLP Time Calculation (min) (ACUTE ONLY): 15 min  Past Medical History:  Past Medical History:  Diagnosis Date  . Allergy    rhinitis  . Anemia   . Anxiety   . Brain cancer ([place]) 06/2016   breast ca with mets to brain  . DDD (degenerative disc disease), lumbar dx'd in 1980's  . GERD (gastroesophageal reflux disease)   . Heart murmur   . History of blood transfusion 2016 X 3 (05/30/2015)  . Hypertension   . Kidney stone 1990's X 1   "passed it"  . Obesity   . 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 Vitamin B 12 deficiency   . Vitamin D deficiency    Past Surgical History:  Past Surgical History:  Procedure Laterality Date  . APPLICATION OF CRANIAL NAVIGATION N/A 07/13/2016   Procedure: APPLICATION OF CRANIAL NAVIGATION;  Surgeon: Kevan Ny Ditty, MD;  Location: [place];  Service: Neurosurgery;  Laterality: N/A;  . BREAST BIOPSY Left ~ 12/2014  . CHOLECYSTECTOMY OPEN  ~ 1975  . ESOPHAGEAL DILATION  X 3  . MASTECTOMY COMPLETE / SIMPLE W/ SENTINEL NODE BIOPSY Left 05/30/2015  . MASTECTOMY W/ SENTINEL NODE BIOPSY Left 05/30/2015   Procedure: LEFT MASTECTOMY WITH SENTINEL LYMPH NODE BIOPSY;  Surgeon: Autumn Messing III, MD;  Location: [place];  Service: General;  Laterality: Left;  . PORTACATH PLACEMENT Right 12/26/2014   Procedure: INSERTION PORT-A-CATH;  Surgeon: Autumn Messing III, MD;  Location: [place];  Service: General;  Laterality: Right;  . PR DURAL GRAFT REPAIR,SPINE DEFECT N/A 07/13/2016   Procedure: Marshia Ly STERIOTACTIC BIOPSY WITH STEALTH;  Surgeon: Kevan Ny Ditty, MD;  Location: [place];  Service: Neurosurgery;  Laterality: N/A;   HPI:  Savannah Northrup Edwardsis a 70 y.o.femalewith medical history significant of  metastatic breast cancer with brain metastases s/p resection, radiation, medical noncompliance, history of seizure disorder, hypertension, degenerative disc disease, gastroesophageal reflux disease, large hiatal hernia, EGD s/p dilations (2005, 1997) who presented with left-sided weakness and left facial twitch, dysarthric speech. MRI 04/18/17 showed no acute findings, stable number, size and appearance of multiple predominately RIGHT temporal parenchymal and dural-based metastasis in a background of treatment related changes. Pt had bedside sawllow evaluation 01/07/17 recommended regular diet with thin liquids, no further ST intervention.   Assessment / Plan / Recommendation Clinical Impression  Patient presents with oropharyngeal swallow which appears grossly within functional limits with adequate airway protection at bedside. No overt signs of aspiration noted despite challenging with consecutive straw sips of thin liquids in excess of 3 oz. Oral preparation, control and clearance appears adequate. Oral motor examination unremarkable; pt's dysarthria has resolved, though she does have hoarse vocal quality, consistent with her prior evaluation. She reports this is baseline due to reflux. Pt does display delayed coughing after PO which does not appear attributable to aspiration. Suspect this is more indicative of primary esophageal dysphagia, given belching and pt's history of esophageal stricture requiring dilation, hiatal hernia, GERD. At this time recommend regular diet with thin liquids, esophageal precautions, no further skilled ST intervention at this time. Should pt endorse difficulties with pills, solid foods, or display coughing after meals, she may benefit from GI consult. SLP will s/o.  SLP Visit Diagnosis: Dysphagia, unspecified (R13.10)  Aspiration Risk  Mild aspiration risk    Diet Recommendation Regular;Thin liquid   Liquid Administration via: Cup;Straw Medication Administration: Whole  meds with liquid Supervision: Patient able to self feed Compensations: Slow rate;Small sips/bites;Follow solids with liquid Postural Changes: Seated upright at 90 degrees;Remain upright for at least 30 minutes after po intake    Other  Recommendations Recommended Consults: Consider GI evaluation;Consider esophageal assessment Oral Care Recommendations: Oral care BID Other Recommendations: Clarify dietary restrictions   Follow up Recommendations None      Frequency and Duration            Prognosis Prognosis for Safe Diet Advancement: Good      Swallow Study   General Date of Onset: 04/18/17 HPI: RAEVIN Howard a 71 y.o.femalewith medical history significant of metastatic breast cancer with brain metastases s/p resection, radiation, medical noncompliance, history of seizure disorder, hypertension, degenerative disc disease, gastroesophageal reflux disease, large hiatal hernia, EGD s/p dilations (2005, 1997) who presented with left-sided weakness and left facial twitch, dysarthric speech. MRI 04/18/17 showed no acute findings, stable number, size and appearance of multiple predominately RIGHT temporal parenchymal and dural-based metastasis in a background of treatment related changes. Pt had bedside sawllow evaluation 01/07/17 recommended regular diet with thin liquids, no further ST intervention. Type of Study: Bedside Swallow Evaluation Previous Swallow Assessment: see HPI Diet Prior to this Study: NPO Temperature Spikes Noted: No Respiratory Status: Room air History of Recent Intubation: No Behavior/Cognition: Alert;Cooperative;Pleasant mood Oral Cavity Assessment: Within Functional Limits Oral Care Completed by SLP: No Oral Cavity - Dentition: Adequate natural dentition Vision: Functional for self-feeding Self-Feeding Abilities: Able to feed self Patient Positioning: Upright in bed Baseline Vocal Quality: Hoarse Volitional Cough: Strong Volitional Swallow: Able to elicit     Oral/Motor/Sensory Function Overall Oral Motor/Sensory Function: Within functional limits   Ice Chips Ice chips: Within functional limits Presentation: Spoon   Thin Liquid Thin Liquid: Within functional limits Presentation: Cup;Self Fed;Straw    Nectar Thick Nectar Thick Liquid: Not tested   Honey Thick Honey Thick Liquid: Not tested   Puree Puree: Within functional limits Presentation: Self Fed;Spoon   Solid   GO   Solid: Within functional limits Presentation: Self Fed    Functional Assessment Tool Used: skilled clinical judgment Functional Limitations: Swallowing Swallow Current Status ([id number]): At least 1 percent but less than 20 percent impaired, limited or restricted Swallow Goal Status [phone]): At least 1 percent but less than 20 percent impaired, limited or restricted Swallow Discharge Status [phone]): At least 1 percent but less than 20 percent impaired, limited or restricted   Aliene Altes 04/18/2017,8:55 AM  Deneise Lever, [place], [place] Speech-Language Pathologist [phone]

## 2017-04-18 NOTE — Procedures (Signed)
Date of recording 04/18/2017  Referring physician Ashish Aurora  Technical Digital EEG recording using 10-20 international electrode system  Reason for the study 71 year old female with the history of cancer and metastatic disease of the brain presented with seizures.  Description of the recording When awake posterior dominant rhythm is 5-7 Hz which is reactive. EEG comprised of focal delta and theta slowing involving the right frontotemporal region Epileptiform features were not seen during this recording Sleep architecture was not observed  Impression  The EEG is abnormal and findings are suggestive of Nonspecific generalized cerebral dysfunction Right frontotemporal focal cerebral dysfunction suggesting underlying structural defect

## 2017-04-18 NOTE — Progress Notes (Signed)
EEG completed; results pending.    

## 2017-04-18 NOTE — Progress Notes (Addendum)
Neurology progress note  Subjective Reports feeling better. No more twitching.  Objective Vitals:   04/18/17 0700 04/18/17 0704  BP: (!) 132/98   Pulse: (!) 50   Resp: 16   Temp:  97.7 F (36.5 C)  SpO2: 98%   Gen.: Well-developed well-nourished no acute distress, cushingoid appearance HEENT: Alopecia, normocephalic, atraumatic, moist oral mucous membranes CVS: S1-S2 heard, regular rate rhythm Respiratory: Scattered rales Abdomen: Nontender, obese Neurological exam Patient is awake alert and oriented 2. Speech is mildly dysarthric and the voice is pretty raspy. Naming, comprehension and repetition intact. Fair attention and concentration. Cranial nerves: Pupils equal round reactive to light, visual fields full, extraocular movements intact, face is grossly symmetric, facial sensation intact, auditory acuity grossly decreased, palate elevates midline, shoulder shrug intact, tongue midline. Motor exam: Nearly symmetric 5/5 strength in all 4 extremities. A coarse tremor is present in both upper extremities, which patient says his baseline. Sensory exam: Intact to light touch all over with no extinction on double simultaneous stimulus. Coordination: Intact finger-nose-finger bilaterally Gait testing was deferred at this time.  Diagnostic tests Imaging -MRI scan reviewed by me. Unchanged appearance of the right temporal and dural based metastases especially in the right temporal area.  EEG pending at this time  BMP significant for mild hyperglycemia, decreased GFR of 58 CBC significant for normal white count, anemia with hemoglobin of 10.5 hematocrit 31.6, MCV 102.3, normal platelet count.  Assessment 71 year old woman with metastatic breast cancer to the brain, noncompliant with the medications and history of seizures because of the brain metastases presented as an acute code stroke for left-sided weakness and dysarthria. Her dysarthria is improving and her weakness is nearly  resolved. She was having a partial seizure on admission, which has been treated by Ativan 1, Keppra 1 g IV load 1 followed by Keppra 1000mg  twice a day IV, and Vimpat load of 200 mg 1 followed by Vimpat 100 mg twice a day. I also started Dexamethasone 4mg  q6h.  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 MRI does not reveal an acute stroke Metastatic breast cancer to the brain Anxiety Noncompliance to medications  Recommendations Awaiting EEG Continue with Keppra 1000 mg twice a day (home dose) and Vimpat 100 mg twice a day (added on this admission) - IV for now nothing by mouth when she passes a swallow evaluation. Continue with Dexamethasone. Please have oncology on board for recs with her chemotherapy and if they have any input on steroids for her. Seizure precautions  Neurology will follow with you  -- Amie Portland, MD [facility] [phone]  If 7pm to 7am, please call on call as listed on AMION.  Addendum- EEG report reviewed - pasted below. Impression  The EEG is abnormal and findings are suggestive of Nonspecific generalized cerebral dysfunction Right frontotemporal focal cerebral dysfunction suggesting underlying structural defect  UPDATED RECS: Patient is not in status. Continue with the recommendations above for steroids and antiepileptic medications.  We will follow.  -- Amie Portland, MD [facility] [phone]  If 7pm to 7am, please call on call as listed on AMION.

## 2017-04-18 NOTE — Progress Notes (Signed)
PROGRESS NOTE    Savannah Howard  NWG:[id number] DOB: 1945-12-18 DOA: 04/17/2017 PCP: [name], MD   Brief Narrative:  Patient is a 71 year old female history of metastatic breast cancer to the brain, medical noncompliance, history of seizures, hypertension, gastroesophageal reflux disease presented to the ED with acute onset of left-sided weakness and left facial twitch. Patient seen by neurology and patient admitted for seizures. Patient placed on IV Keppra as well as Vimpat.   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 Patient with no further seizure activity/facial twitching. MRI of the brain negative for any acute CVA however does show stable metastatic disease. EEG pending. Replete electrolytes. Patient was loaded with IV Keppra on presentation. Continue Keppra 1000 mg twice daily as well as Vimpat 100 mg twice daily per neurology recommendations. Patient has been seen by speech therapy and will be placed on a diet.  #2 metastatic breast cancer to the brain MRI of the brain negative for any acute infarcts however does show chronic stable metastatic disease. Patient had presented with simple partial seizures likely etiology of seizures. Continue Decadron and Keppra. Will discuss/formally consult patient's oncologist tomorrow for further recommendations in terms of her chemotherapy and input on steroids. Supportive care.  #3 gastroesophageal reflux disease PPI.  #4 dehydration IV fluids.  #5 hypokalemia Replete.  #6 hypertension Continue  Toprol. Resume home regimen of losartan.  #7 dysarthria Likely secondary to problem #1 and 2. Resolved.  #8 anxiety Ativan when necessary.   DVT prophylaxis: Lovenox Code Status: DO NOT RESUSCITATE Family Communication: Updated patient and healthcare power of attorney at bedside. Disposition Plan: Remain in SDU.    Consultants:   Neurology: Dr.Arora 04/17/2017  Procedures:  EEG 04/18/2017  CT head 04/17/2017  MRI brain with and without contrast 04/18/2017  Antimicrobials:   None   Subjective: Laying in bed. Feeling better. Patient with no further facial twitching noted. No shortness of breath. No chest pain. No nausea or emesis.  Objective: Vitals:   04/18/17 0600 04/18/17 0700 04/18/17 0704 04/18/17 1056  BP: 136/63 (!) 132/98  (!) 151/71  Pulse:  (!) 50  63  Resp: 14 16    Temp:   97.7 F (36.5 C)   TempSrc:   Oral   SpO2:  98%    Weight:      Height:        Intake/Output Summary (Last 24 hours) at 04/18/17 1112 Last data filed at 04/18/17 0700  Gross per 24 hour  Intake          1896.67 ml  Output              375 ml  Net          1521.67 ml   Filed Weights   04/17/17 2004 04/18/17 0349  Weight: 84.1 kg (185 lb 6.5 oz) 86 kg (189 lb 9.5 oz)    Examination:  General exam: Appears calm and comfortable  Respiratory system: Clear to auscultation. Respiratory effort normal. Cardiovascular system: S1 & S2 heard,  RRR. No JVD, murmurs, rubs, gallops or clicks. No pedal edema. Gastrointestinal system: Abdomen is nondistended, soft and nontender. No organomegaly or masses felt. Normal bowel sounds heard. Central nervous system: Alert and oriented. No focal neurological deficits. Extremities: Symmetric 5 x 5 power. Skin: No rashes, lesions or ulcers Psychiatry: Judgement and insight appear normal. Mood & affect appropriate.     Data Reviewed: I have personally reviewed following labs and imaging studies  CBC:  Recent Labs Lab 04/16/17 0951  04/17/17 1423 04/17/17 2038 04/18/17 0536  WBC 10.2  --  8.7 4.7  NEUTROABS 7.0*  --  6.7  --   HGB 12.3 11.9* 11.8* 10.5*  HCT 35.8 35.0* 35.3* 31.6*  MCV 100.8  --  102.3* 102.3*  PLT 365  --  308 950   Basic Metabolic Panel:  Recent Labs Lab 04/16/17 0951 04/17/17 1412 04/17/17 1423 04/17/17 2038 04/18/17 0536 04/18/17 0845  NA 137 137 138  --  137  --   K 2.7* 3.1* 3.0*  --  4.2  --   CL  --  100* 98*  --  104  --   CO2 22 24  --   --  24  --   GLUCOSE 99 132* 133*  --  142*  --   BUN 22.1 15 17   --  13  --   CREATININE 1.8* 1.30* 1.10*  --  0.95  --   CALCIUM 9.2 8.4*  --   --  8.0*  --   MG  --   --   --  1.1*  --  1.0*   GFR: Estimated Creatinine Clearance: 55.6 mL/min (by C-G formula based on SCr of 0.95 mg/dL). Liver Function Tests:  Recent Labs Lab 04/16/17 0951 04/17/17 1412 04/18/17 0536  AST 11 21 11*  ALT <6 10* 9*  ALKPHOS 62 49 45  BILITOT 0.74 1.0 0.7  PROT 6.3* 5.5* 5.3*  ALBUMIN 2.6* 2.5* 2.3*   No results for input(s): LIPASE, AMYLASE in the last 168 hours. No results for input(s): AMMONIA in the last 168 hours. Coagulation Profile:  Recent Labs Lab 04/17/17 1412  INR 0.99   Cardiac Enzymes: No results for input(s): CKTOTAL, CKMB, CKMBINDEX, TROPONINI in the last 168 hours. BNP (last 3 results) No results for input(s): PROBNP in the last 8760 hours. HbA1C: No results for input(s): HGBA1C in the last 72 hours. CBG:  Recent Labs Lab 04/17/17 1402  GLUCAP 134*   Lipid Profile: No results for input(s): CHOL, HDL, LDLCALC, TRIG, CHOLHDL, LDLDIRECT in the last 72 hours. Thyroid Function Tests: No results for input(s): TSH, T4TOTAL, FREET4, T3FREE, THYROIDAB in the last 72 hours. Anemia Panel: No results for input(s): VITAMINB12, FOLATE, FERRITIN, TIBC, IRON, RETICCTPCT in the last 72 hours. Sepsis Labs: No results for input(s): PROCALCITON, LATICACIDVEN in the last 168 hours.  Recent Results (from the past 240 hour(s))  MRSA  PCR Screening     Status: None   Collection Time: 04/17/17  8:12 PM  Result Value Ref Range Status   MRSA by PCR NEGATIVE NEGATIVE Final    Comment:        The GeneXpert MRSA Assay (FDA approved for NASAL specimens only), is one component of a comprehensive MRSA colonization surveillance program. It is not intended to diagnose MRSA infection nor to guide or monitor treatment for MRSA infections.          Radiology Studies: Mr Jeri Cos DT Contrast  Result Date: 04/18/2017 CLINICAL DATA:  New  onset LEFT-sided weakness and LEFT facial twitch. Follow-up brain metastasis. History of metastatic breast cancer with brain metastasis, seizure disorder, hypertension. EXAM: MRI HEAD WITHOUT AND WITH CONTRAST TECHNIQUE: Multiplanar, multiecho pulse sequences of the brain and surrounding structures were obtained without and with intravenous contrast. CONTRAST:  65mL MULTIHANCE GADOBENATE DIMEGLUMINE 529 MG/ML IV SOLN COMPARISON:  CT HEAD April 17, 2017 and MRI of the head March 21, 2017 and Jan 06, 2017 FINDINGS: INTRACRANIAL CONTENTS: No reduced diffusion to suggest acute ischemia or hypercellular tumor. Stable appearance of for cortical and dural based metastasis, preponderance and RIGHT temporal lobe and dura, largest measuring 14 x 2.6 cm. Irregular RIGHT frontotemporal dural thickening subjacent to craniotomy. Stable cyst septa bright only be artifact RIGHT temporal resection cavity. Subcentimeter LEFT parietal metastasis is stable. RIGHT frontal developmental venous anomaly. Stable vasogenic edema and gliosis RIGHT temporal lobe. Ventricles and sulci are overall normal for patient's age. Patchy supratentorial white matter FLAIR T2 hyperintensities compatible with mild to moderate chronic small vessel ischemic disease. No abnormal extra-axial fluid collections. Extensive fall seen dural calcification. VASCULAR: Normal major intracranial vascular flow voids present at skull base. SKULL AND UPPER  CERVICAL SPINE: No abnormal sellar expansion. RIGHT craniotomy. Craniocervical junction maintained. SINUSES/ORBITS: Trace RIGHT mastoid effusion. Paranasal sinuses are well-aerated. The included ocular globes and orbital contents are non-suspicious. OTHER: None. IMPRESSION: 1. No acute intracranial process. 2. Stable number, size and appearance of multiple predominately RIGHT temporal parenchymal and dural-based metastasis in a background of treatment related changes. Electronically Signed   By: Elon Alas M.D.   On: 04/18/2017 05:40   Ct Head Code Stroke Wo Contrast  Result Date: 04/17/2017 CLINICAL DATA:  Code stroke. Altered level of consciousness. Right sided weakness and slurred speech. History of breast cancer metastatic to brain EXAM: CT HEAD WITHOUT CONTRAST TECHNIQUE: Contiguous axial images were obtained from the base of the skull through the vertex without intravenous contrast. COMPARISON:  MRI head 03/21/2017.  CT 01/06/2017 FINDINGS: Brain: Postop right temporal craniotomy for tumor resection. Hypodensity in the right temporal lobe similar to prior studies. No significant mass-effect on the temporal horn. No shift of the midline structures. Small metastatic deposit left parietal lobe noted on MRI and not seen on CT Negative for hemorrhage.  No acute infarct.  Ventricle size normal Vascular: Negative for hyperdense vessel Skull: Right temporal craniotomy.  No skull lesion identified Sinuses/Orbits: Negative Other: Negative ASPECTS ([place] Stroke Program Early CT Score) - Ganglionic level infarction (caudate, lentiform nuclei, internal capsule, insula, M1-M3 cortex): 7 - Supraganglionic infarction (M4-M6 cortex): 3 Total score (0-10 with 10 being normal): 10 IMPRESSION: 1. No acute abnormality. Tumor resection and treatment in the right temporal lobe appear stable. 2. ASPECTS is 10 These results were called by telephone at the time of interpretation on 04/17/2017 at 2:34 pm to Dr. Rory Percy, who  verbally acknowledged these results. Electronically Signed   By: Franchot Gallo M.D.   On: 04/17/2017 14:35        Scheduled Meds: . Chlorhexidine Gluconate Cloth  6 each Topical Daily  . cholecalciferol  2,000 Units Oral Daily  . dexamethasone  4 mg Intravenous Q6H  . enoxaparin (LOVENOX) injection  40 mg Subcutaneous Q24H  . magnesium gluconate  500 mg Oral BID  . metoprolol succinate  25 mg Oral Daily  . pantoprazole  40 mg Oral Daily  . senna  1 tablet Oral BID   Continuous Infusions: . sodium chloride 75 mL/hr (04/18/17 0814)  . lacosamide (VIMPAT) IV 100  mg (04/18/17 1100)  . levETIRAcetam Stopped (04/18/17 0328)     LOS: 1 day    Time spent: 37 minutes    Ajayla Iglesias, MD [facility] Pager [phone]  If 7PM-7AM, please contact night-coverage [email or web address] Password [email or web address] 04/18/2017, 11:12 AM

## 2017-04-18 NOTE — Plan of Care (Signed)
Problem: Education: Goal: Knowledge of [facility] Education information/materials will improve Outcome: Progressing Pt. Educated on how to order food.

## 2017-04-18 NOTE — Evaluation (Signed)
Occupational Therapy Evaluation Patient Details Name: Savannah Howard MRN: [id number] DOB: September 24, 1945 Today's Date: 04/18/2017    History of Present Illness 71 y.o. female with medical history significant of metastatic breast cancer with brain metastases, medical noncompliance, history of seizure disorder, hypertension, degenerative disc disease, gastroesophageal reflux disease  who presents to the ED via EMS for evaluation of acute onset of left-sided weakness and left facial twitch. MRI on 9/9 negative for acute infarct. EEG on 9/9 + for seizure activity in the right frontotemporal region.   Clinical Impression   Pt reports she was managing BADL with mod I PTA; family/friends assisting with IADL. Currently pt requires mod assist for functional mobility and LB ADL. Pt presenting with generalized weakness, deconditioning, impaired balance, and decreased activity tolerance impacting her independence and safety with ADL and functional mobility. At this time, recommending SNF for follow up to maximize independence and safety with ADL and functional mobility prior to return home alone. Pt would benefit from continued skilled OT to address established goals.    Follow Up Recommendations  SNF;Supervision/Assistance - 24 hour    Equipment Recommendations  3 in 1 bedside commode    Recommendations for Other Services PT consult     Precautions / Restrictions Precautions Precautions: Fall Precaution Comments: Seizure Restrictions Weight Bearing Restrictions: No      Mobility Bed Mobility Overal bed mobility: Needs Assistance Bed Mobility: Supine to Sit;Sit to Supine     Supine to sit: Supervision Sit to supine: Min assist   General bed mobility comments: Assist for LEs back to bed.  Transfers Overall transfer level: Needs assistance Equipment used: 1 person hand held assist Transfers: Sit to/from Stand Sit to Stand: Min assist         General transfer comment: for balance in  standing. mod assist for dynamic standing activities    Balance Overall balance assessment: Needs assistance Sitting-balance support: Feet supported;No upper extremity supported Sitting balance-Leahy Scale: Good     Standing balance support: Single extremity supported Standing balance-Leahy Scale: Poor                             ADL either performed or assessed with clinical judgement   ADL Overall ADL's : Needs assistance/impaired Eating/Feeding: Set up;Sitting   Grooming: Set up;Supervision/safety;Sitting   Upper Body Bathing: Min guard;Sitting   Lower Body Bathing: Moderate assistance;Sit to/from stand   Upper Body Dressing : Min guard;Sitting   Lower Body Dressing: Moderate assistance;Sit to/from stand   Toilet Transfer: Moderate assistance;Ambulation (HHA) Toilet Transfer Details (indicate cue type and reason): Simualted by sit to stand from EOB with short distance functional mobility in room         Functional mobility during ADLs: Moderate assistance (HHA) General ADL Comments: Pt fatigues easily. VSS throughout     Vision         Perception     Praxis      Pertinent Vitals/Pain Pain Assessment: No/denies pain     Hand Dominance Right   Extremity/Trunk Assessment Upper Extremity Assessment Upper Extremity Assessment: Generalized weakness   Lower Extremity Assessment Lower Extremity Assessment: Defer to PT evaluation   Cervical / Trunk Assessment Cervical / Trunk Assessment: Kyphotic   Communication Communication Communication: No difficulties   Cognition Arousal/Alertness: Awake/alert Behavior During Therapy: WFL for tasks assessed/performed Overall Cognitive Status: Within Functional Limits for tasks assessed  General Comments       Exercises     Shoulder Instructions      Home Living Family/patient expects to be discharged to:: Private residence (Pt is a "long term  resident" at [facility]) Living Arrangements: Alone Available Help at Discharge: Family;Friend(s);Available PRN/intermittently Type of Home: Other(Comment) ([employer]) Home Access: Level entry     Home Layout: One level     Bathroom Shower/Tub: Occupational psychologist: Standard     Home Equipment: Clinical cytogeneticist - 2 wheels;Cane - single point;Adaptive equipment Adaptive Equipment: Reacher Additional Comments: Sounds like pt was receiving HH services PTA; most likely PT      Prior Functioning/Environment Level of Independence: Needs assistance  Gait / Transfers Assistance Needed: RW for mobility ADL's / Homemaking Assistance Needed: Friends/family assisting with IADL. Pt reports mod I with BADL.            OT Problem List: Decreased strength;Decreased activity tolerance;Impaired balance (sitting and/or standing);Decreased knowledge of use of DME or AE;Obesity      OT Treatment/Interventions: Self-care/ADL training;Therapeutic exercise;Energy conservation;DME and/or AE instruction;Therapeutic activities;Patient/family education;Balance training    OT Goals(Current goals can be found in the care plan section) Acute Rehab OT Goals Patient Stated Goal: get better OT Goal Formulation: With patient Time For Goal Achievement: 05/02/17 Potential to Achieve Goals: Good ADL Goals Pt Will Perform Grooming: with supervision;standing Pt Will Perform Upper Body Bathing: with set-up;sitting Pt Will Perform Lower Body Bathing: with supervision;sit to/from stand Pt Will Transfer to Toilet: with supervision;ambulating;bedside commode Pt Will Perform Toileting - Clothing Manipulation and hygiene: with supervision;sit to/from stand Pt Will Perform Tub/Shower Transfer: Shower transfer;with supervision;ambulating;shower seat;rolling walker  OT Frequency: Min 2X/week   Barriers to D/C: Decreased caregiver support  pt lives alone       Co-evaluation              AM-PAC PT  "6 Clicks" Daily Activity     Outcome Measure Help from another person eating meals?: None Help from another person taking care of personal grooming?: A Little Help from another person toileting, which includes using toliet, bedpan, or urinal?: A Lot Help from another person bathing (including washing, rinsing, drying)?: A Lot Help from another person to put on and taking off regular upper body clothing?: A Little Help from another person to put on and taking off regular lower body clothing?: A Lot 6 Click Score: 16   End of Session Equipment Utilized During Treatment: Gait belt Nurse Communication: Mobility status  Activity Tolerance: Patient tolerated treatment well Patient left: in bed;with call bell/phone within reach;with bed alarm set  OT Visit Diagnosis: Unsteadiness on feet (R26.81);Other abnormalities of gait and mobility (R26.89);Muscle weakness (generalized) (M62.81)                Time: 4431-5400 OT Time Calculation (min): 29 min Charges:  OT General Charges $OT Visit: 1 Visit OT Evaluation $OT Eval Moderate Complexity: 1 Mod OT Treatments $Self Care/Home Management : 8-22 mins G-Codes:     Raju Coppolino A. Ulice Brilliant, M.S., OTR/L Pager: [place] 04/18/2017, 4:44 PM

## 2017-04-19 DIAGNOSIS — N390 Urinary tract infection, site not specified: Secondary | ICD-10-CM

## 2017-04-19 DIAGNOSIS — C7931 Secondary malignant neoplasm of brain: Secondary | ICD-10-CM

## 2017-04-19 DIAGNOSIS — E538 Deficiency of other specified B group vitamins: Secondary | ICD-10-CM

## 2017-04-19 DIAGNOSIS — R569 Unspecified convulsions: Secondary | ICD-10-CM

## 2017-04-19 DIAGNOSIS — C50912 Malignant neoplasm of unspecified site of left female breast: Secondary | ICD-10-CM

## 2017-04-19 DIAGNOSIS — B9620 Unspecified Escherichia coli [E. coli] as the cause of diseases classified elsewhere: Secondary | ICD-10-CM | POA: Diagnosis present

## 2017-04-19 LAB — BASIC METABOLIC PANEL
ANION GAP: 7 (ref 5–15)
BUN: 15 mg/dL (ref 6–20)
CALCIUM: 8.2 mg/dL — AB (ref 8.9–10.3)
CO2: 24 mmol/L (ref 22–32)
Chloride: 106 mmol/L (ref 101–111)
Creatinine, Ser: 1.09 mg/dL — ABNORMAL HIGH (ref 0.44–1.00)
GFR calc Af Amer: 57 mL/min — ABNORMAL LOW (ref >60)
GFR calc non Af Amer: 49 mL/min — ABNORMAL LOW (ref >60)
GLUCOSE: 175 mg/dL — AB (ref 65–99)
Potassium: 3.9 mmol/L (ref 3.5–5.1)
Sodium: 137 mmol/L (ref 135–145)

## 2017-04-19 LAB — URINE CULTURE: Culture: >=100000 — AB

## 2017-04-19 LAB — CBC
HEMATOCRIT: 31.7 % — AB (ref 36.0–46.0)
Hemoglobin: 10.1 g/dL — ABNORMAL LOW (ref 12.0–15.0)
MCH: 32.8 pg (ref 26.0–34.0)
MCHC: 31.9 g/dL (ref 30.0–36.0)
MCV: 102.9 fL — AB (ref 78.0–100.0)
Platelets: 345 10*3/uL (ref 150–400)
RBC: 3.08 MIL/uL — ABNORMAL LOW (ref 3.87–5.11)
RDW: 15.2 % (ref 11.5–15.5)
WBC: 8.3 10*3/uL (ref 4.0–10.5)

## 2017-04-19 LAB — MAGNESIUM: Magnesium: 2 mg/dL (ref 1.7–2.4)

## 2017-04-19 MED ORDER — FOSFOMYCIN TROMETHAMINE 3 G PO PACK
3.0000 g | PACK | Freq: Once | ORAL | Status: AC
  Administered 2017-04-19: 3 g via ORAL
  Filled 2017-04-19: qty 3

## 2017-04-19 MED ORDER — LACOSAMIDE 50 MG PO TABS
100.0000 mg | ORAL_TABLET | Freq: Two times a day (BID) | ORAL | Status: DC
  Administered 2017-04-19 – 2017-04-20 (×2): 100 mg via ORAL
  Filled 2017-04-19 (×3): qty 2

## 2017-04-19 MED ORDER — DEXAMETHASONE 4 MG PO TABS
4.0000 mg | ORAL_TABLET | Freq: Two times a day (BID) | ORAL | Status: DC
  Administered 2017-04-19 – 2017-04-20 (×2): 4 mg via ORAL
  Filled 2017-04-19 (×3): qty 1

## 2017-04-19 MED ORDER — LEVETIRACETAM 500 MG PO TABS
500.0000 mg | ORAL_TABLET | Freq: Two times a day (BID) | ORAL | Status: DC
  Administered 2017-04-19 – 2017-04-20 (×3): 500 mg via ORAL
  Filled 2017-04-19 (×3): qty 1

## 2017-04-19 NOTE — Progress Notes (Addendum)
PROGRESS NOTE    Savannah Howard  GHW:[id number] DOB: 08/28/1945 DOA: 04/17/2017 PCP: [name], MD   Brief Narrative:  Patient is a 71 year old female history of metastatic breast cancer to the brain, medical noncompliance, history of seizures, hypertension, gastroesophageal reflux disease presented to the ED with acute onset of left-sided weakness and left facial twitch. Patient seen by neurology and patient admitted for seizures. Patient placed on IV Keppra as well as Vimpat.   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 Patient with no further seizure activity/facial twitching. MRI of the brain negative for any acute CVA however does show stable metastatic disease. EEG was abnormal with findings suggestive of nonspecific generalized cerebral dysfunction, right frontal temporal focal cerebral dysfunction suggesting underlying structural defect. Electrolytes repleted. Patient was loaded with IV Keppra on presentation. Continue Keppra 1000 mg twice daily as well as Vimpat 100 mg twice daily per neurology recommendations. Will change IV Keppra and IV Vimpat to oral formulations. Patient has been seen by speech therapy and will be placed on a diet.  #2 metastatic breast cancer to the brain MRI of the brain negative for any acute infarcts however does show chronic stable metastatic disease. Patient had presented with simple partial seizures likely etiology of seizures. Continue Keppra and Vimpat and change to oral. Change Decadron to 4 mg by mouth twice a day.  Discussed with patient's oncologist Dr.Gudena. PT/OT. Supportive care.  #3 gastroesophageal reflux disease PPI.  #4 dehydration Gentle hydration.   #5 hypokalemia Repleted.  #6 hypertension Continue Toprol. D/c losartan secondary to borderline BP.  #7 dysarthria Likely secondary to problem #1 and 2. Resolved.  #8 anxiety Ativan when necessary.  #9 E Coli UTI Fosfomycin 3 gm x 1.   DVT prophylaxis: Lovenox Code Status: DO NOT RESUSCITATE Family Communication: Updated patient. No family at bedside. Disposition Plan: Transfer to telemetry.    Consultants:   Neurology: Dr.Arora 04/17/2017  Procedures:  EEG 04/18/2017  CT head 04/17/2017  MRI brain with and without contrast 04/18/2017  Antimicrobials:   None   Subjective: Laying in bed. No further seizures noted overnight. No shortness of breath. No chest pain. No nausea or emesis.  Objective: Vitals:   04/19/17 0500 04/19/17 0600 04/19/17 0700 04/19/17 0824  BP: (!) 92/51 125/66 (!) 142/73 137/73  Pulse: (!) 56 60 (!) 56 62  Resp: 19 19 14 17   Temp:    97.9 F (36.6 C)  TempSrc:    Oral  SpO2: 99% 97% 96% 98%  Weight: 88.1 kg (194 lb 3.6 oz)     Height:        Intake/Output Summary (Last 24 hours) at 04/19/17 1128 Last data filed at 04/19/17 0930  Gross per 24 hour  Intake             2905 ml  Output              695 ml  Net             2210 ml  Filed Weights   04/17/17 2004 04/18/17 0349 04/19/17 0500  Weight: 84.1 kg (185 lb 6.5 oz) 86 kg (189 lb 9.5 oz) 88.1 kg (194 lb 3.6 oz)    Examination:  General exam: Appears calm and comfortable. Moon pie face Respiratory system: Clear to auscultation anterior lung fields. Respiratory effort normal. Cardiovascular system: S1 & S2 heard, RRR. No JVD, murmurs, rubs, gallops or clicks. No pedal edema. Gastrointestinal system: Abdomen is nondistended, soft and nontender. No organomegaly or masses felt. Normal bowel sounds heard. Central nervous  system: Alert and oriented. No focal neurological deficits. Extremities: Symmetric 5 x 5 power. Skin: No rashes, lesions or ulcers Psychiatry: Judgement and insight appear normal. Mood & affect appropriate.     Data Reviewed: I have personally reviewed following labs and imaging studies  CBC:  Recent Labs Lab 04/16/17 0951 04/17/17 1423 04/17/17 2038 04/18/17 0536 04/19/17 0340  WBC 10.2  --  8.7 4.7 8.3  NEUTROABS 7.0*  --  6.7  --   --   HGB 12.3 11.9* 11.8* 10.5* 10.1*  HCT 35.8 35.0* 35.3* 31.6* 31.7*  MCV 100.8  --  102.3* 102.3* 102.9*  PLT 365  --  308 308 376   Basic Metabolic Panel:  Recent Labs Lab 04/16/17 0951 04/17/17 1412 04/17/17 1423 04/17/17 2038 04/18/17 0536 04/18/17 0845 04/19/17 0340  NA 137 137 138  --  137  --  137  K 2.7* 3.1* 3.0*  --  4.2  --  3.9  CL  --  100* 98*  --  104  --  106  CO2 22 24  --   --  24  --  24  GLUCOSE 99 132* 133*  --  142*  --  175*  BUN 22.1 15 17   --  13  --  15  CREATININE 1.8* 1.30* 1.10*  --  0.95  --  1.09*  CALCIUM 9.2 8.4*  --   --  8.0*  --  8.2*  MG  --   --   --  1.1*  --  1.0* 2.0   GFR: Estimated Creatinine Clearance: 49.1 mL/min (A) (by C-G formula based on SCr of 1.09 mg/dL (H)). Liver Function Tests:  Recent Labs Lab 04/16/17 0951 04/17/17 1412 04/18/17 0536  AST 11 21 11*  ALT <6 10* 9*  ALKPHOS 62 49 45  BILITOT 0.74 1.0 0.7  PROT 6.3* 5.5* 5.3*  ALBUMIN 2.6* 2.5* 2.3*   No results for input(s): LIPASE, AMYLASE in the last 168 hours. No results for input(s): AMMONIA in the last 168 hours. Coagulation Profile:  Recent Labs Lab 04/17/17 1412  INR 0.99   Cardiac Enzymes: No results for input(s): CKTOTAL, CKMB, CKMBINDEX, TROPONINI in the last 168 hours. BNP (last 3 results) No results for input(s): PROBNP in the last 8760 hours. HbA1C: No results for input(s): HGBA1C in the last 72 hours. CBG:  Recent Labs Lab 04/17/17 1402  GLUCAP 134*   Lipid Profile: No results for  input(s): CHOL, HDL, LDLCALC, TRIG, CHOLHDL, LDLDIRECT in the last 72 hours. Thyroid Function Tests: No results for input(s): TSH, T4TOTAL, FREET4, T3FREE, THYROIDAB in the last 72 hours. Anemia Panel: No results for input(s): VITAMINB12, FOLATE, FERRITIN, TIBC, IRON, RETICCTPCT in the last 72 hours. Sepsis Labs: No results for input(s): PROCALCITON, LATICACIDVEN in the last 168 hours.  Recent Results (from the past 240 hour(s))  Urine Culture     Status: Abnormal   Collection Time: 04/17/17  7:51 PM  Result Value  Ref Range Status   Specimen Description URINE, RANDOM  Final   Special Requests NONE  Final   Culture >=100,000 COLONIES/mL ESCHERICHIA COLI (A)  Final   Report Status 04/19/2017 FINAL  Final   Organism ID, Bacteria ESCHERICHIA COLI (A)  Final      Susceptibility   Escherichia coli - MIC*    AMPICILLIN <=2 SENSITIVE Sensitive     CEFAZOLIN <=4 SENSITIVE Sensitive     CEFTRIAXONE <=1 SENSITIVE Sensitive     CIPROFLOXACIN <=0.25 SENSITIVE Sensitive     GENTAMICIN <=1 SENSITIVE Sensitive     IMIPENEM <=0.25 SENSITIVE Sensitive     NITROFURANTOIN <=16 SENSITIVE Sensitive     TRIMETH/SULFA <=20 SENSITIVE Sensitive     AMPICILLIN/SULBACTAM <=2 SENSITIVE Sensitive     PIP/TAZO <=4 SENSITIVE Sensitive     Extended ESBL NEGATIVE Sensitive     * >=100,000 COLONIES/mL ESCHERICHIA COLI  MRSA PCR Screening     Status: None   Collection Time: 04/17/17  8:12 PM  Result Value Ref Range Status   MRSA by PCR NEGATIVE NEGATIVE Final    Comment:        The GeneXpert MRSA Assay (FDA approved for NASAL specimens only), is one component of a comprehensive MRSA colonization surveillance program. It is not intended to diagnose MRSA infection nor to guide or monitor treatment for MRSA infections.          Radiology Studies: Mr Jeri Cos WU Contrast  Result Date: 04/18/2017 CLINICAL DATA:  New onset LEFT-sided weakness and LEFT facial twitch. Follow-up brain metastasis. History of  metastatic breast cancer with brain metastasis, seizure disorder, hypertension. EXAM: MRI HEAD WITHOUT AND WITH CONTRAST TECHNIQUE: Multiplanar, multiecho pulse sequences of the brain and surrounding structures were obtained without and with intravenous contrast. CONTRAST:  11mL MULTIHANCE GADOBENATE DIMEGLUMINE 529 MG/ML IV SOLN COMPARISON:  CT HEAD April 17, 2017 and MRI of the head March 21, 2017 and Jan 06, 2017 FINDINGS: INTRACRANIAL CONTENTS: No reduced diffusion to suggest acute ischemia or hypercellular tumor. Stable appearance of for cortical and dural based metastasis, preponderance and RIGHT temporal lobe and dura, largest measuring 14 x 2.6 cm. Irregular RIGHT frontotemporal dural thickening subjacent to craniotomy. Stable cyst septa bright only be artifact RIGHT temporal resection cavity. Subcentimeter LEFT parietal metastasis is stable. RIGHT frontal developmental venous anomaly. Stable vasogenic edema and gliosis RIGHT temporal lobe. Ventricles and sulci are overall normal for patient's age. Patchy supratentorial white matter FLAIR T2 hyperintensities compatible with mild to moderate chronic small vessel ischemic disease. No abnormal extra-axial fluid collections. Extensive fall seen dural calcification. VASCULAR: Normal major intracranial vascular flow voids present at skull base. SKULL AND UPPER CERVICAL SPINE: No abnormal sellar expansion. RIGHT craniotomy. Craniocervical junction maintained. SINUSES/ORBITS: Trace RIGHT mastoid effusion. Paranasal sinuses are well-aerated. The included ocular globes and orbital contents are non-suspicious. OTHER: None. IMPRESSION: 1. No acute intracranial process. 2. Stable number, size and appearance of multiple predominately RIGHT temporal parenchymal and dural-based metastasis in a background of treatment related changes. Electronically Signed   By: Elon Alas M.D.   On: 04/18/2017 05:40   Ct Head Code Stroke Wo Contrast  Result Date:  04/17/2017 CLINICAL DATA:  Code stroke. Altered level of consciousness. Right sided weakness and slurred speech. History of breast cancer metastatic to brain EXAM: CT HEAD WITHOUT CONTRAST TECHNIQUE: Contiguous axial images were obtained from the base of the skull through the vertex without intravenous contrast. COMPARISON:  MRI head 03/21/2017.  CT 01/06/2017 FINDINGS: Brain: Postop right  temporal craniotomy for tumor resection. Hypodensity in the right temporal lobe similar to prior studies. No significant mass-effect on the temporal horn. No shift of the midline structures. Small metastatic deposit left parietal lobe noted on MRI and not seen on CT Negative for hemorrhage.  No acute infarct.  Ventricle size normal Vascular: Negative for hyperdense vessel Skull: Right temporal craniotomy.  No skull lesion identified Sinuses/Orbits: Negative Other: Negative ASPECTS ([place] Stroke Program Early CT Score) - Ganglionic level infarction (caudate, lentiform nuclei, internal capsule, insula, M1-M3 cortex): 7 - Supraganglionic infarction (M4-M6 cortex): 3 Total score (0-10 with 10 being normal): 10 IMPRESSION: 1. No acute abnormality. Tumor resection and treatment in the right temporal lobe appear stable. 2. ASPECTS is 10 These results were called by telephone at the time of interpretation on 04/17/2017 at 2:34 pm to Dr. Rory Percy, who verbally acknowledged these results. Electronically Signed   By: Franchot Gallo M.D.   On: 04/17/2017 14:35        Scheduled Meds: . Chlorhexidine Gluconate Cloth  6 each Topical Daily  . cholecalciferol  2,000 Units Oral Daily  . dexamethasone  4 mg Oral Q12H  . enoxaparin (LOVENOX) injection  40 mg Subcutaneous Q24H  . losartan  50 mg Oral Daily  . magnesium gluconate  500 mg Oral BID  . metoprolol succinate  25 mg Oral Daily  . pantoprazole  40 mg Oral Daily  . senna  1 tablet Oral BID   Continuous Infusions: . sodium chloride 75 mL/hr (04/19/17 0101)  . lacosamide (VIMPAT)  IV 100 mg (04/19/17 1006)  . levETIRAcetam Stopped (04/19/17 0328)     LOS: 2 days    Time spent: 38 minutes    Kanyah Matsushima, MD [facility] Pager [phone]  If 7PM-7AM, please contact night-coverage [email or web address] Password [email or web address] 04/19/2017, 11:28 AM

## 2017-04-19 NOTE — Progress Notes (Signed)
Called report to 30 M spoke to [employer].

## 2017-04-19 NOTE — Progress Notes (Signed)
Pt transferred to 17 M bed 9 per bed accompanied by RN

## 2017-04-19 NOTE — Evaluation (Signed)
Physical Therapy Evaluation Patient Details Name: Savannah Howard MRN: [id number] DOB: Oct 21, 1945 Today's Date: 04/19/2017   History of Present Illness  71 y.o. female with medical history significant of metastatic breast cancer with brain metastases, medical noncompliance, history of seizure disorder, hypertension, degenerative disc disease, gastroesophageal reflux disease  who presents to the ED via EMS for evaluation of acute onset of left-sided weakness and left facial twitch. MRI on 9/9 negative for acute infarct. EEG on 9/9 + for seizure activity in the right frontotemporal region.  Clinical Impression  Patient presents with left sided weakness (premorbid), impaired balance and impaired mobility s/p above. Tolerated gait training with Min guard assist for safety. Pt lives in [place] [address] and has HHPT PTA per her report. Not sure of accuracy of pt's understanding of her deficits/condition. Will need further cognitive assessment. Will follow acutely to maximize independence and mobility prior to return home. Recommend continuing HHPT.    Follow Up Recommendations Home health PT;Supervision for mobility/OOB (continue HHPT)    Equipment Recommendations  None recommended by PT    Recommendations for Other Services       Precautions / Restrictions Precautions Precautions: Fall Precaution Comments: Seizure Restrictions Weight Bearing Restrictions: No      Mobility  Bed Mobility Overal bed mobility: Needs Assistance Bed Mobility: Supine to Sit     Supine to sit: Supervision;HOB elevated     General bed mobility comments: Use of rails for support; no dizziness.  Transfers Overall transfer level: Needs assistance Equipment used: Rolling walker (2 wheeled) Transfers: Sit to/from Stand Sit to Stand: Min guard         General transfer comment: Min guard for safety. Stood from EOB x2 no assist needed. Transferred to chair post ambulation.  Ambulation/Gait Ambulation/Gait  assistance: Min guard Ambulation Distance (Feet): 150 Feet Assistive device: Rolling walker (2 wheeled) Gait Pattern/deviations: Step-through pattern;Decreased stride length;Decreased dorsiflexion - left;Trunk flexed Gait velocity: decreased   General Gait Details: Slow, mildly unsteady gait with RW. 2-3/4 DOE. VSS. Decreased foot clearance LLE. No overt LOB but fatigues.   Stairs            Wheelchair Mobility    Modified Rankin (Stroke Patients Only)       Balance Overall balance assessment: Needs assistance Sitting-balance support: Feet supported;No upper extremity supported Sitting balance-Leahy Scale: Good     Standing balance support: During functional activity Standing balance-Leahy Scale: Poor Standing balance comment: Able to stand statically without UE support but requires UE support for dynamic standing.                             Pertinent Vitals/Pain Pain Assessment: No/denies pain    Home Living Family/patient expects to be discharged to:: Private residence (long term resident at [address]) Living Arrangements: Alone Available Help at Discharge: Family;Friend(s);Available PRN/intermittently Type of Home: Other(Comment) (hotel) Home Access: Level entry     Home Layout: One level Home Equipment: Clinical cytogeneticist - 2 wheels;Cane - single point;Adaptive equipment Additional Comments: Sounds like pt was receiving [place] services PTA; most likely PT    Prior Function Level of Independence: Needs assistance   Gait / Transfers Assistance Needed: RW for mobility for community otherwise furniture walker at baseline  ADL's / Homemaking Assistance Needed: Friends/family assisting with IADL. Pt reports mod I with BADL.  Comments: doesn't use AD in apartment, uses RW in community or shopping cart at store, does not drive  Hand Dominance   Dominant Hand: Right    Extremity/Trunk Assessment   Upper Extremity Assessment Upper Extremity  Assessment: Defer to OT evaluation    Lower Extremity Assessment Lower Extremity Assessment: LLE deficits/detail;RLE deficits/detail RLE Sensation: history of peripheral neuropathy LLE Deficits / Details: Grossly ~4/5 throughout-premorbid LLE Sensation: history of peripheral neuropathy    Cervical / Trunk Assessment Cervical / Trunk Assessment: Kyphotic  Communication   Communication: No difficulties  Cognition Arousal/Alertness: Awake/alert Behavior During Therapy: WFL for tasks assessed/performed Overall Cognitive Status: No family/caregiver present to determine baseline cognitive functioning                                 General Comments: Seems WFL for basic tasks but goes on tangents and overly stating safety at current living situation- per RN, estranged from children??      General Comments General comments (skin integrity, edema, etc.): VSS.    Exercises     Assessment/Plan    PT Assessment Patient needs continued PT services  PT Problem List Decreased strength;Decreased mobility;Decreased safety awareness;Decreased balance;Decreased cognition;Decreased activity tolerance;Cardiopulmonary status limiting activity;Impaired sensation       PT Treatment Interventions Therapeutic activities;Gait training;Therapeutic exercise;Patient/family education;Balance training;Functional mobility training;Cognitive remediation    PT Goals (Current goals can be found in the Care Plan section)  Acute Rehab PT Goals Patient Stated Goal: get better PT Goal Formulation: With patient Time For Goal Achievement: 05/03/17 Potential to Achieve Goals: Good    Frequency Min 3X/week   Barriers to discharge Decreased caregiver support lives alone but does have [place] services    Co-evaluation               AM-PAC PT "6 Clicks" Daily Activity  Outcome Measure Difficulty turning over in bed (including adjusting bedclothes, sheets and blankets)?: None Difficulty moving  from lying on back to sitting on the side of the bed? : None Difficulty sitting down on and standing up from a chair with arms (e.g., wheelchair, bedside commode, etc,.)?: None Help needed moving to and from a bed to chair (including a wheelchair)?: None Help needed walking in hospital room?: A Little Help needed climbing 3-5 steps with a railing? : A Little 6 Click Score: 22    End of Session Equipment Utilized During Treatment: Gait belt Activity Tolerance: Patient tolerated treatment well Patient left: in chair;with call bell/phone within reach;with chair alarm set Nurse Communication: Mobility status PT Visit Diagnosis: Unsteadiness on feet (R26.81);Muscle weakness (generalized) (M62.81);Difficulty in walking, not elsewhere classified (R26.2)    Time: 3300-7622 PT Time Calculation (min) (ACUTE ONLY): 27 min   Charges:   PT Evaluation $PT Eval Moderate Complexity: 1 Mod PT Treatments $Gait Training: 8-22 mins   PT G Codes:        Wray Kearns, PT, DPT [phone]    Savannah Howard 04/19/2017, 12:49 PM

## 2017-04-19 NOTE — Progress Notes (Signed)
HEMATOLOGY-ONCOLOGY PROGRESS NOTE  SUBJECTIVE: patient is admitted for seizures. She has metastatic breast cancer with brain metastases. She was on Keppraand had a previous episode of relapse of seizure in the clinic. She recovered from that very quickly. This time she was at home and had a seizure. She was brought in through the EMS and admitted to the hospital.I will strongly with her previously about compliance with her medications. Because of these issues, we elected to change her treatment to intravenous therapy.She was supposed to start with the Herceptin intravenously on 04/26/2017. She presented with sudden onset of unilateral weakness and numbness and slurred speech along with jerks.  OBJECTIVE: REVIEW OF SYSTEMS:   Constitutional: Denies fevers, chills or abnormal weight loss, facial puffiness due to prior steroids Eyes: Denies blurriness of vision Ears, nose, mouth, throat, and face: Denies mucositis or sore throat Respiratory: Denies cough, dyspnea or wheezes Cardiovascular: Denies palpitation, chest discomfort Gastrointestinal:  Denies nausea, heartburn or change in bowel habits Skin: Denies abnormal skin rashes Lymphatics: Denies new lymphadenopathy or easy bruising Neurological seizures and fatigue Behavioral/Psych: Mood is stable, no new changes  Extremities: No lower extremity edema All other systems were reviewed with the patient and are negative.  I have reviewed the past medical history, past surgical history, social history and family history with the patient and they are unchanged from previous note.   PHYSICAL EXAMINATION: ECOG PERFORMANCE STATUS: 1 - Symptomatic but completely ambulatory  Vitals:   04/19/17 1500 04/19/17 1600  BP: 92/78 124/79  Pulse: 63 66  Resp: 17 11  Temp:  98.9 F (37.2 C)  SpO2: 100% 100%   Filed Weights   04/17/17 2004 04/18/17 0349 04/19/17 0500  Weight: 185 lb 6.5 oz (84.1 kg) 189 lb 9.5 oz (86 kg) 194 lb 3.6 oz (88.1 kg)     GENERAL:alert, no distress and comfortable SKIN: skin color, texture, turgor are normal, no rashes or significant lesions EYES: normal, Conjunctiva are pink and non-injected, sclera clear OROPHARYNX:no exudate, no erythema and lips, buccal mucosa, and tongue normal  NECK: supple, thyroid normal size, non-tender, without nodularity LYMPH:  no palpable lymphadenopathy in the cervical, axillary or inguinal LUNGS: clear to auscultation and percussion with normal breathing effort HEART: regular rate & rhythm and no murmurs and no lower extremity edema ABDOMEN:abdomen soft, non-tender and normal bowel sounds Musculoskeletal:no cyanosis of digits and no clubbing  NEURO: alert & oriented x 3 with fluent speech, no focal motor/sensory deficits  LABORATORY DATA:  I have reviewed the data as listed CMP Latest Ref Rng & Units 04/19/2017 04/18/2017 04/17/2017  Glucose 65 - 99 mg/dL 175(H) 142(H) 133(H)  BUN 6 - 20 mg/dL 15 13 17   Creatinine 0.44 - 1.00 mg/dL 1.09(H) 0.95 1.10(H)  Sodium 135 - 145 mmol/L 137 137 138  Potassium 3.5 - 5.1 mmol/L 3.9 4.2 3.0(L)  Chloride 101 - 111 mmol/L 106 104 98(L)  CO2 22 - 32 mmol/L 24 24 -  Calcium 8.9 - 10.3 mg/dL 8.2(L) 8.0(L) -  Total Protein 6.5 - 8.1 g/dL - 5.3(L) -  Total Bilirubin 0.3 - 1.2 mg/dL - 0.7 -  Alkaline Phos 38 - 126 U/L - 45 -  AST 15 - 41 U/L - 11(L) -  ALT 14 - 54 U/L - 9(L) -    Lab Results  Component Value Date   WBC 8.3 04/19/2017   HGB 10.1 (L) 04/19/2017   HCT 31.7 (L) 04/19/2017   MCV 102.9 (H) 04/19/2017   PLT 345 04/19/2017  NEUTROABS 6.7 04/17/2017    ASSESSMENT AND PLAN: 1. Metastatic breast cancer with brain metastases: Patient is supposed to start with Herceptin along with Faslodex injections starting on 04/26/2017. 2. Seizures: On Keppra intravenously. She was also resumed on steroids. I reduced the dosage to 4 mg by mouth twice a day. 3. Compliance issues:Patient will need home health to follow her very closely  with her medication management.  I will follow the patient my clinic in one week

## 2017-04-19 NOTE — Care Management Note (Signed)
Case Management Note  Patient Details  Name: Savannah Howard MRN: [id number] Date of Birth: 01-06-1946  Subjective/Objective:   Patient lives at the [address] at address [facility]. [place] [phone].  Karis Juba at [phone] is her friend that helps her out and will be the one who transport her to motel at discharge.  Patient has insurance and medication coverage.  She has a rollator at [employer] and a cane.  She is active with [name] for [facility] and HHPT, she states she would like to continue with them for her [facility] services.  NCM spoke with Malachy Mood with [name] to confirm and she states yes patient is active.  Will need Resume HHRN/PT orders.   HHRN will need to assist with med management.                Action/Plan: NCM will follow for dc needs.   Expected Discharge Date:  04/20/17               Expected Discharge Plan:  [place]  In-House Referral:     Discharge planning Services  CM Consult  Post Acute Care Choice:  Home Health, Resumption of Svcs/PTA Provider Choice offered to:  Patient  DME Arranged:    DME Agency:     HH Arranged:  RN, PT [place] Agency:  [place]  Status of Service:  In process, will continue to follow  If discussed at Long Length of Stay Meetings, dates discussed:    Additional Comments:  Zenon Mayo, RN 04/19/2017, 3:16 PM

## 2017-04-19 NOTE — Progress Notes (Addendum)
Subjective: No further seizure activity noted. Has a resting tremor which is baseline.   Objective: Current vital signs: BP 137/73   Pulse 62   Temp 97.9 F (36.6 C) (Oral)   Resp 17   Ht 5\' 3"  (1.6 m)   Wt 88.1 kg (194 lb 3.6 oz)   SpO2 98%   BMI 34.41 kg/m  Vital signs in last 24 hours: Temp:  [97.5 F (36.4 C)-98 F (36.7 C)] 97.9 F (36.6 C) (09/10 0824) Pulse Rate:  [52-124] 62 (09/10 0824) Resp:  [11-26] 17 (09/10 0824) BP: (92-151)/(44-101) 137/73 (09/10 0824) SpO2:  [94 %-100 %] 98 % (09/10 0824) Weight:  [88.1 kg (194 lb 3.6 oz)] 88.1 kg (194 lb 3.6 oz) (09/10 0500)  Intake/Output from previous day: 09/09 0701 - 09/10 0700 In: 2405 [P.O.:60; I.V.:2065; IV Piggyback:280] Out: 425 [Urine:425] Intake/Output this shift: Total I/O In: 500 [P.O.:350; I.V.:150] Out: 150 [Urine:150] Nutritional status: Diet Heart Room service appropriate? Yes; Fluid consistency: Thin  ROS:                                                                                                                                       History obtained from the patient  General ROS: negative for - chills, fatigue, fever, night sweats, weight gain or weight loss Psychological ROS: negative for - behavioral disorder, hallucinations, memory difficulties, mood swings or suicidal ideation Ophthalmic ROS: negative for - blurry vision, double vision, eye pain or loss of vision ENT ROS: negative for - epistaxis, nasal discharge, oral lesions, sore throat, tinnitus or vertigo Allergy and Immunology ROS: negative for - hives or itchy/watery eyes Hematological and Lymphatic ROS: negative for - bleeding problems, bruising or swollen lymph nodes Endocrine ROS: negative for - galactorrhea, hair pattern changes, polydipsia/polyuria or temperature intolerance Respiratory ROS: negative for - cough, hemoptysis, shortness of breath or wheezing Cardiovascular ROS: negative for - chest pain, dyspnea on exertion, edema or  irregular heartbeat Gastrointestinal ROS: negative for - abdominal pain, diarrhea, hematemesis, nausea/vomiting or stool incontinence Genito-Urinary ROS: negative for - dysuria, hematuria, incontinence or urinary frequency/urgency Musculoskeletal ROS: negative for - joint swelling or muscular weakness Neurological ROS: as noted in HPI Dermatological ROS: negative for rash and skin lesion changes    Neurologic Exam: General: NAD Mental Status: Alert, oriented, thought content appropriate.  Speech tremulous due to baseline tremor without evidence of aphasia.  Able to follow 3 step commands without difficulty. Cranial Nerves: II:  Visual fields grossly normal, pupils equal, round, reactive to light and accommodation III,IV, VI: ptosis not present, extra-ocular motions intact bilaterally V,VII: smile symmetric, facial light touch sensation normal bilaterally VIII: hearing normal bilaterally IX,X: uvula rises symmetrically XI: bilateral shoulder shrug XII: midline tongue extension without atrophy or fasciculations  Motor: Right : Upper extremity   5/5    Left:     Upper extremity   5/5  Lower extremity  5/5     Lower extremity   4/5--due to knee pain A coarse tremor is present in both upper extremities, which patient says his baseline. Sensory: Pinprick and light touch intact throughout, bilaterally Deep Tendon Reflexes:  Right: Upper Extremity   Left: Upper extremity   biceps (C-5 to C-6) 2/4   biceps (C-5 to C-6) 2/4 tricep (C7) 2/4    triceps (C7) 2/4 Brachioradialis (C6) 2/4  Brachioradialis (C6) 2/4  Lower Extremity Lower Extremity  quadriceps (L-2 to L-4) 2/4   quadriceps (L-2 to L-4) 2/4-difficult to assess due to pain Achilles (S1) 2/4   Achilles (S1) 2/4  Plantars: Right: downgoing   Left: downgoing   Lab Results: Basic Metabolic Panel:  Recent Labs Lab 04/16/17 0951 04/17/17 1412 04/17/17 1423 04/17/17 2038 04/18/17 0536 04/18/17 0845 04/19/17 0340  NA 137  137 138  --  137  --  137  K 2.7* 3.1* 3.0*  --  4.2  --  3.9  CL  --  100* 98*  --  104  --  106  CO2 22 24  --   --  24  --  24  GLUCOSE 99 132* 133*  --  142*  --  175*  BUN 22.1 15 17   --  13  --  15  CREATININE 1.8* 1.30* 1.10*  --  0.95  --  1.09*  CALCIUM 9.2 8.4*  --   --  8.0*  --  8.2*  MG  --   --   --  1.1*  --  1.0* 2.0    Liver Function Tests:  Recent Labs Lab 04/16/17 0951 04/17/17 1412 04/18/17 0536  AST 11 21 11*  ALT <6 10* 9*  ALKPHOS 62 49 45  BILITOT 0.74 1.0 0.7  PROT 6.3* 5.5* 5.3*  ALBUMIN 2.6* 2.5* 2.3*   No results for input(s): LIPASE, AMYLASE in the last 168 hours. No results for input(s): AMMONIA in the last 168 hours.  CBC:  Recent Labs Lab 04/16/17 0951 04/17/17 1423 04/17/17 2038 04/18/17 0536 04/19/17 0340  WBC 10.2  --  8.7 4.7 8.3  NEUTROABS 7.0*  --  6.7  --   --   HGB 12.3 11.9* 11.8* 10.5* 10.1*  HCT 35.8 35.0* 35.3* 31.6* 31.7*  MCV 100.8  --  102.3* 102.3* 102.9*  PLT 365  --  308 308 345    Cardiac Enzymes: No results for input(s): CKTOTAL, CKMB, CKMBINDEX, TROPONINI in the last 168 hours.  Lipid Panel: No results for input(s): CHOL, TRIG, HDL, CHOLHDL, VLDL, LDLCALC in the last 168 hours.  CBG:  Recent Labs Lab 04/17/17 1402  GLUCAP 134*    Microbiology: Results for orders placed or performed during the hospital encounter of 04/17/17  Urine Culture     Status: Abnormal   Collection Time: 04/17/17  7:51 PM  Result Value Ref Range Status   Specimen Description URINE, RANDOM  Final   Special Requests NONE  Final   Culture >=100,000 COLONIES/mL ESCHERICHIA COLI (A)  Final   Report Status 04/19/2017 FINAL  Final   Organism ID, Bacteria ESCHERICHIA COLI (A)  Final      Susceptibility   Escherichia coli - MIC*    AMPICILLIN <=2 SENSITIVE Sensitive     CEFAZOLIN <=4 SENSITIVE Sensitive     CEFTRIAXONE <=1 SENSITIVE Sensitive     CIPROFLOXACIN <=0.25 SENSITIVE Sensitive     GENTAMICIN <=1 SENSITIVE Sensitive      IMIPENEM <=0.25 SENSITIVE Sensitive  NITROFURANTOIN <=16 SENSITIVE Sensitive     TRIMETH/SULFA <=20 SENSITIVE Sensitive     AMPICILLIN/SULBACTAM <=2 SENSITIVE Sensitive     PIP/TAZO <=4 SENSITIVE Sensitive     Extended ESBL NEGATIVE Sensitive     * >=100,000 COLONIES/mL ESCHERICHIA COLI  MRSA PCR Screening     Status: None   Collection Time: 04/17/17  8:12 PM  Result Value Ref Range Status   MRSA by PCR NEGATIVE NEGATIVE Final    Comment:        The GeneXpert MRSA Assay (FDA approved for NASAL specimens only), is one component of a comprehensive MRSA colonization surveillance program. It is not intended to diagnose MRSA infection nor to guide or monitor treatment for MRSA infections.     Coagulation Studies:  Recent Labs  04/17/17 1412  LABPROT 13.0  INR 0.99    Imaging: Mr Jeri Cos GU Contrast  Result Date: 04/18/2017 CLINICAL DATA:  New onset LEFT-sided weakness and LEFT facial twitch. Follow-up brain metastasis. History of metastatic breast cancer with brain metastasis, seizure disorder, hypertension. EXAM: MRI HEAD WITHOUT AND WITH CONTRAST TECHNIQUE: Multiplanar, multiecho pulse sequences of the brain and surrounding structures were obtained without and with intravenous contrast. CONTRAST:  38mL MULTIHANCE GADOBENATE DIMEGLUMINE 529 MG/ML IV SOLN COMPARISON:  CT HEAD April 17, 2017 and MRI of the head March 21, 2017 and Jan 06, 2017 FINDINGS: INTRACRANIAL CONTENTS: No reduced diffusion to suggest acute ischemia or hypercellular tumor. Stable appearance of for cortical and dural based metastasis, preponderance and RIGHT temporal lobe and dura, largest measuring 14 x 2.6 cm. Irregular RIGHT frontotemporal dural thickening subjacent to craniotomy. Stable cyst septa bright only be artifact RIGHT temporal resection cavity. Subcentimeter LEFT parietal metastasis is stable. RIGHT frontal developmental venous anomaly. Stable vasogenic edema and gliosis RIGHT temporal lobe.  Ventricles and sulci are overall normal for patient's age. Patchy supratentorial white matter FLAIR T2 hyperintensities compatible with mild to moderate chronic small vessel ischemic disease. No abnormal extra-axial fluid collections. Extensive fall seen dural calcification. VASCULAR: Normal major intracranial vascular flow voids present at skull base. SKULL AND UPPER CERVICAL SPINE: No abnormal sellar expansion. RIGHT craniotomy. Craniocervical junction maintained. SINUSES/ORBITS: Trace RIGHT mastoid effusion. Paranasal sinuses are well-aerated. The included ocular globes and orbital contents are non-suspicious. OTHER: None. IMPRESSION: 1. No acute intracranial process. 2. Stable number, size and appearance of multiple predominately RIGHT temporal parenchymal and dural-based metastasis in a background of treatment related changes. Electronically Signed   By: Elon Alas M.D.   On: 04/18/2017 05:40   Ct Head Code Stroke Wo Contrast  Result Date: 04/17/2017 CLINICAL DATA:  Code stroke. Altered level of consciousness. Right sided weakness and slurred speech. History of breast cancer metastatic to brain EXAM: CT HEAD WITHOUT CONTRAST TECHNIQUE: Contiguous axial images were obtained from the base of the skull through the vertex without intravenous contrast. COMPARISON:  MRI head 03/21/2017.  CT 01/06/2017 FINDINGS: Brain: Postop right temporal craniotomy for tumor resection. Hypodensity in the right temporal lobe similar to prior studies. No significant mass-effect on the temporal horn. No shift of the midline structures. Small metastatic deposit left parietal lobe noted on MRI and not seen on CT Negative for hemorrhage.  No acute infarct.  Ventricle size normal Vascular: Negative for hyperdense vessel Skull: Right temporal craniotomy.  No skull lesion identified Sinuses/Orbits: Negative Other: Negative ASPECTS ([place] Stroke Program Early CT Score) - Ganglionic level infarction (caudate, lentiform nuclei,  internal capsule, insula, M1-M3 cortex): 7 - Supraganglionic infarction (M4-M6 cortex): 3 Total  score (0-10 with 10 being normal): 10 IMPRESSION: 1. No acute abnormality. Tumor resection and treatment in the right temporal lobe appear stable. 2. ASPECTS is 10 These results were called by telephone at the time of interpretation on 04/17/2017 at 2:34 pm to Dr. Rory Percy, who verbally acknowledged these results. Electronically Signed   By: Franchot Gallo M.D.   On: 04/17/2017 14:35    EEG: Impression  The EEG is abnormal and findings are suggestive of Nonspecific generalized cerebral dysfunction Right frontotemporal focal cerebral dysfunction suggesting underlying structural defect   Medications:  Scheduled: . Chlorhexidine Gluconate Cloth  6 each Topical Daily  . cholecalciferol  2,000 Units Oral Daily  . dexamethasone  4 mg Oral Q12H  . enoxaparin (LOVENOX) injection  40 mg Subcutaneous Q24H  . losartan  50 mg Oral Daily  . magnesium gluconate  500 mg Oral BID  . metoprolol succinate  25 mg Oral Daily  . pantoprazole  40 mg Oral Daily  . senna  1 tablet Oral BID    Assessment/Plan: 71 year old woman with metastatic breast cancer to the brain, noncompliant with the medications and history of seizures because of the brain metastases presented as an acute code stroke for left-sided weakness and dysarthria. Her dysarthria resolved.  Recommend: -continue Current AED regime  Keppra 500mg  twice a day IV, and Vimpat 100 mg twice a day. -Continue with Dexamethasone -Please have oncology on board for recs with her chemotherapy and if they have any input on steroids for her. Seizure precautions    ----neurology will S/O--please call with questions.   Etta Quill PA-C [facility] [phone]  04/19/2017, 9:39 AM

## 2017-04-19 NOTE — Progress Notes (Signed)
Patient arrived to unit, denies pain at this time. Will continue to monitor.

## 2017-04-20 ENCOUNTER — Inpatient Hospital Stay (HOSPITAL_COMMUNITY): Payer: Medicare Other

## 2017-04-20 DIAGNOSIS — M7989 Other specified soft tissue disorders: Secondary | ICD-10-CM

## 2017-04-20 DIAGNOSIS — C50412 Malignant neoplasm of upper-outer quadrant of left female breast: Secondary | ICD-10-CM

## 2017-04-20 DIAGNOSIS — Z170 Estrogen receptor positive status [ER+]: Secondary | ICD-10-CM

## 2017-04-20 DIAGNOSIS — N390 Urinary tract infection, site not specified: Secondary | ICD-10-CM

## 2017-04-20 DIAGNOSIS — B9620 Unspecified Escherichia coli [E. coli] as the cause of diseases classified elsewhere: Secondary | ICD-10-CM

## 2017-04-20 LAB — BASIC METABOLIC PANEL
Anion gap: 8 (ref 5–15)
BUN: 19 mg/dL (ref 6–20)
CALCIUM: 7.8 mg/dL — AB (ref 8.9–10.3)
CO2: 22 mmol/L (ref 22–32)
CREATININE: 1.15 mg/dL — AB (ref 0.44–1.00)
Chloride: 107 mmol/L (ref 101–111)
GFR calc non Af Amer: 46 mL/min — ABNORMAL LOW (ref >60)
GFR, EST AFRICAN AMERICAN: 54 mL/min — AB (ref >60)
Glucose, Bld: 143 mg/dL — ABNORMAL HIGH (ref 65–99)
Potassium: 3.9 mmol/L (ref 3.5–5.1)
SODIUM: 137 mmol/L (ref 135–145)

## 2017-04-20 LAB — MAGNESIUM: MAGNESIUM: 1.5 mg/dL — AB (ref 1.7–2.4)

## 2017-04-20 MED ORDER — LACOSAMIDE 100 MG PO TABS: 100.0000 mg | ORAL_TABLET | Freq: Two times a day (BID) | ORAL | 1 refills | Status: AC

## 2017-04-20 MED ORDER — SENNA 8.6 MG PO TABS: 1.0000 | ORAL_TABLET | Freq: Two times a day (BID) | ORAL | 0 refills | Status: DC

## 2017-04-20 MED ORDER — HEPARIN SOD (PORK) LOCK FLUSH 100 UNIT/ML IV SOLN
500.0000 [IU] | INTRAVENOUS | Status: AC | PRN
  Administered 2017-04-20: 500 [IU]

## 2017-04-20 MED ORDER — OMEPRAZOLE 40 MG PO CPDR: 40.0000 mg | DELAYED_RELEASE_CAPSULE | Freq: Every day | ORAL | 1 refills | Status: AC

## 2017-04-20 MED ORDER — MAGNESIUM SULFATE 4 GM/100ML IV SOLN
4.0000 g | Freq: Once | INTRAVENOUS | Status: AC
  Administered 2017-04-20: 4 g via INTRAVENOUS
  Filled 2017-04-20: qty 100

## 2017-04-20 MED ORDER — LEVETIRACETAM 500 MG PO TABS: 500.0000 mg | ORAL_TABLET | Freq: Two times a day (BID) | ORAL | 1 refills | Status: AC

## 2017-04-20 MED ORDER — DEXAMETHASONE 4 MG PO TABS: 4.0000 mg | ORAL_TABLET | Freq: Two times a day (BID) | ORAL | 0 refills | Status: DC

## 2017-04-20 NOTE — Progress Notes (Signed)
Occupational therapist requested elevated commode for patient. DME-elevated commode Science writer will be notified as patient is leaving without the commode and it's 6:24pm. PT hopes that DME can be sent to her at the motel Patient's POA states that patient "is a horder" and had "absolutely NO SPACE" to put "that thing". She is encouraged that she can put in over her toilet seat. Patient is encouraged to adhere to home meds and follow up appointment. Her POA states that patient has not been taking any of her meds, she said that she found hundreds of meds at the motel where patient stays. When asked if they had thought about assited leaving, the POA(Jeannie) states that patient has a house, 2 cars and a beach house and would not "qualify" for assisted leaving.  D/c reviewed with patient and POA(Jeannie). No further questions

## 2017-04-20 NOTE — Progress Notes (Signed)
Physical Therapy Treatment Patient Details Name: Savannah Howard MRN: [id number] DOB: [birth year] Today's Date: 04/20/2017    History of Present Illness 71 y.o. female with medical history significant of metastatic breast cancer with brain metastases, medical noncompliance, history of seizure disorder, hypertension, degenerative disc disease, gastroesophageal reflux disease  who presents to the ED via EMS for evaluation of acute onset of left-sided weakness and left facial twitch. MRI on 9/9 negative for acute infarct. EEG on 9/9 + for seizure activity in the right frontotemporal region.    PT Comments    Pt progressing well.  Emphasis today on mobilizing in/out of bed, transfers and gait with RW, using only with assistance her home setup may provide.   Follow Up Recommendations  Home health PT;Supervision for mobility/OOB     Equipment Recommendations  None recommended by PT    Recommendations for Other Services       Precautions / Restrictions Precautions Precautions: Fall    Mobility  Bed Mobility Overal bed mobility: Needs Assistance Bed Mobility: Rolling;Sidelying to Sit;Sit to Supine Rolling: Modified independent (Device/Increase time) Sidelying to sit: Supervision   Sit to supine: Min guard   General bed mobility comments: Simulated headboard and took away rails, pt came up without assist otherwise.  Transfers Overall transfer level: Needs assistance Equipment used: Rolling walker (2 wheeled) Transfers: Sit to/from Stand Sit to Stand: Supervision            Ambulation/Gait Ambulation/Gait assistance: Min guard Ambulation Distance (Feet): 200 Feet Assistive device: Rolling walker (2 wheeled) Gait Pattern/deviations: Step-through pattern Gait velocity: decreased Gait velocity interpretation: Below normal speed for age/gender General Gait Details: generally safe with RW, mild list/drift R with mild scissor to maintain middle of the RW   Stairs             Wheelchair Mobility    Modified Rankin (Stroke Patients Only)       Balance Overall balance assessment: Needs assistance   Sitting balance-Leahy Scale: Good     Standing balance support: During functional activity Standing balance-Leahy Scale: Fair Standing balance comment: Can stand statically without use of the RW                            Cognition Arousal/Alertness: Awake/alert Behavior During Therapy: WFL for tasks assessed/performed Overall Cognitive Status: Within Functional Limits for tasks assessed                                        Exercises      General Comments        Pertinent Vitals/Pain Pain Assessment: No/denies pain    Home Living                      Prior Function            PT Goals (current goals can now be found in the care plan section) Acute Rehab PT Goals Patient Stated Goal: get better PT Goal Formulation: With patient Time For Goal Achievement: 05/03/17 Potential to Achieve Goals: Good Progress towards PT goals: Progressing toward goals    Frequency    Min 3X/week      PT Plan Current plan remains appropriate    Co-evaluation              AM-PAC PT "6 Clicks" Daily Activity  Outcome  Measure  Difficulty turning over in bed (including adjusting bedclothes, sheets and blankets)?: None Difficulty moving from lying on back to sitting on the side of the bed? : None Difficulty sitting down on and standing up from a chair with arms (e.g., wheelchair, bedside commode, etc,.)?: None Help needed moving to and from a bed to chair (including a wheelchair)?: A Little Help needed walking in hospital room?: A Little Help needed climbing 3-5 steps with a railing? : A Little 6 Click Score: 21    End of Session   Activity Tolerance: Patient tolerated treatment well Patient left: in bed;with call bell/phone within reach;with bed alarm set Nurse Communication: Mobility status PT  Visit Diagnosis: Unsteadiness on feet (R26.81);Muscle weakness (generalized) (M62.81);Difficulty in walking, not elsewhere classified (R26.2)     Time: 8466-5993 PT Time Calculation (min) (ACUTE ONLY): 36 min  Charges:  $Gait Training: 8-22 mins $Therapeutic Activity: 8-22 mins                    G Codes:       May 20, 2017  Donnella Sham, PT [phone]  (pager)   Tessie Fass Viha Kriegel 05/20/17, 4:07 PM

## 2017-04-20 NOTE — Care Management Note (Signed)
Case Management Note  Patient Details  Name: Savannah Howard MRN: [id number] Date of Birth: 05-Mar-1946  Subjective/Objective:                    Action/Plan: Pt is returning to the extended stay motel with [facility] services. CM spoke with [name] with [facility] and notified her of resumption of services and d/c today.  OT recommending 3 in 1. Pt states she currently does not need this equipment.  Patients friend [name] is going to provide transportation home today. Bedside RN updated.  Expected Discharge Date:  04/20/17               Expected Discharge Plan:  [place]  In-House Referral:     Discharge planning Services  CM Consult  Post Acute Care Choice:  Home Health, Resumption of Svcs/PTA Provider Choice offered to:  Patient  DME Arranged:    DME Agency:     HH Arranged:  RN, PT, OT, Nurse's Aide, Social Work [employer] Agency:  [employer]  Status of Service:  Completed, signed off  If discussed at [employer], dates discussed:    Additional Comments:  Pollie Friar, RN 04/20/2017, 4:45 PM

## 2017-04-20 NOTE — Progress Notes (Signed)
Patient's called to report redness of her left hand. Left hand appears to be bigger that right hand and it appears to look red. MD notified via [facility]

## 2017-04-20 NOTE — Progress Notes (Signed)
Occupational Therapy Treatment Patient Details Name: Savannah Howard MRN: [id number] DOB: April 29, 1946 Today's Date: 04/20/2017    History of present illness 71 y.o. female with medical history significant of metastatic breast cancer with brain metastases, medical noncompliance, history of seizure disorder, hypertension, degenerative disc disease, gastroesophageal reflux disease  who presents to the ED via EMS for evaluation of acute onset of left-sided weakness and left facial twitch. MRI on 9/9 negative for acute infarct. EEG on 9/9 + for seizure activity in the right frontotemporal region.   OT comments  Pt progressing towards established OT goals. Pt performed ADLs and functional mobility with Min Guard A for safety. Discussed with pt having supervision for initial showers at home for safety. Pt would benefit from 3N1 to optimize safety at home during toileting and bathing; pt agreed. Pt planning to dc later today and update recommended dc with HHOT to optimize safety in ADLs. Answered all pt questions and provided education for fall prevention.    Follow Up Recommendations  Home health OT;Supervision/Assistance - 24 hour    Equipment Recommendations  3 in 1 bedside commode    Recommendations for Other Services PT consult    Precautions / Restrictions Precautions Precautions: Fall Precaution Comments: Seizure Restrictions Weight Bearing Restrictions: No       Mobility Bed Mobility Overal bed mobility: Needs Assistance Bed Mobility: Supine to Sit Rolling: Modified independent (Device/Increase time) Sidelying to sit: Supervision Supine to sit: Supervision;HOB elevated Sit to supine: Min guard   General bed mobility comments: Set up to simulate home set up with [facility] and bed rails lowered  Transfers Overall transfer level: Needs assistance Equipment used: Rolling walker (2 wheeled) Transfers: Sit to/from Stand Sit to Stand: Supervision         General transfer comment:  Min guard for safety. Stood from EOB x2 no assist needed. Transferred to chair post ambulation.    Balance Overall balance assessment: Needs assistance Sitting-balance support: Feet supported;No upper extremity supported Sitting balance-Leahy Scale: Good     Standing balance support: During functional activity Standing balance-Leahy Scale: Fair Standing balance comment: Can stand statically without use of the RW                           ADL either performed or assessed with clinical judgement   ADL Overall ADL's : Needs assistance/impaired     Grooming: Min guard;Standing                   Toilet Transfer: Ambulation;Min guard;BSC   Toileting- Clothing Manipulation and Hygiene: Min guard;Sitting/lateral lean       Functional mobility during ADLs: Min guard;Rolling walker General ADL Comments: Pt fatigues easily. VSS throughout. Pt with lots of questions about safety at home. Discussed that pt should shower inititally with supervision due to decreased balance.     Vision       Perception     Praxis      Cognition Arousal/Alertness: Awake/alert Behavior During Therapy: WFL for tasks assessed/performed Overall Cognitive Status: Within Functional Limits for tasks assessed                                          Exercises Exercises:  (hip/knee flexion/ext ROM exercise with graded resistance)   Shoulder Instructions       General Comments pt with questions about medication;  notified RN    Pertinent Vitals/ Pain       Pain Assessment: No/denies pain  Home Living                                          Prior Functioning/Environment              Frequency  Min 2X/week        Progress Toward Goals  OT Goals(current goals can now be found in the care plan section)  Progress towards OT goals: Progressing toward goals  Acute Rehab OT Goals Patient Stated Goal: get better OT Goal Formulation:  With patient Time For Goal Achievement: 05/02/17 Potential to Achieve Goals: Good ADL Goals Pt Will Perform Grooming: with supervision;standing Pt Will Perform Upper Body Bathing: with set-up;sitting Pt Will Perform Lower Body Bathing: with supervision;sit to/from stand Pt Will Transfer to Toilet: with supervision;ambulating;bedside commode Pt Will Perform Toileting - Clothing Manipulation and hygiene: with supervision;sit to/from stand Pt Will Perform Tub/Shower Transfer: Shower transfer;with supervision;ambulating;shower seat;rolling walker  Plan Equipment recommendations need to be updated;Discharge plan needs to be updated    Co-evaluation                 AM-PAC PT "6 Clicks" Daily Activity     Outcome Measure   Help from another person eating meals?: None Help from another person taking care of personal grooming?: A Little Help from another person toileting, which includes using toliet, bedpan, or urinal?: A Lot Help from another person bathing (including washing, rinsing, drying)?: A Lot Help from another person to put on and taking off regular upper body clothing?: A Little Help from another person to put on and taking off regular lower body clothing?: A Lot 6 Click Score: 16    End of Session Equipment Utilized During Treatment: Gait belt;Rolling walker  OT Visit Diagnosis: Unsteadiness on feet (R26.81);Other abnormalities of gait and mobility (R26.89);Muscle weakness (generalized) (M62.81)   Activity Tolerance Patient tolerated treatment well   Patient Left with call bell/phone within reach;in chair;with chair alarm set   Nurse Communication Mobility status        Time: 1710-1731 OT Time Calculation (min): 21 min  Charges: OT General Charges $OT Visit: 1 Visit OT Treatments $Self Care/Home Management : 8-22 mins  [place], OTR/L Acute Rehab Pager: [phone] Office: [place] 04/20/2017, 5:56 PM

## 2017-04-20 NOTE — Progress Notes (Signed)
D/c reviewed with patient and caregiver(over the phone). Caregiver states that she that is on the way to transport patient back patient back to her extended stay-residence. Will continue to monitor

## 2017-04-20 NOTE — Progress Notes (Signed)
Patient states she wishes to reverse her code status to Full Code. Paged [facility] and informed them of this.

## 2017-04-20 NOTE — Progress Notes (Signed)
During rounds Chaplain noticed patient sitting up in the chair by herself.  She was looking out of the window at the birds sunbathing and the rays bouncing off of the side of the building.  Savannah Howard was just starting to put a cookie in her mouth and said I caught her snacking.  I laughed a bit and asked if it was okay to sit down and talk with her.  Savannah Howard was receptive and hopeful.  The theme of our conversation was hope.  She talked about saving young lives from the ways of the world.  She even broke down in tears when I asked why she was in the hospital.  Savannah Howard was purposeful in her discussion because she realized why God had her in the place where she is.  She is to be a testimony to others, her ability to be recovered and share with others is her strength No follow up requested

## 2017-04-20 NOTE — Progress Notes (Signed)
Preliminary results by tech - Left Upper Ext. Venous Duplex Completed. Negative for deep and superficial vein thrombosis. Kayliee Atienza, BS, RDMS, RVT  

## 2017-04-20 NOTE — Discharge Summary (Signed)
Physician Discharge Summary  Savannah Howard:[id number] DOB: Dec 12, 1945 DOA: 04/17/2017  PCP: Savannah Anger, MD  Admit date: 04/17/2017 Discharge date: 04/20/2017  Time spent: 65 minutes  Recommendations for Outpatient Follow-up:  1. Follow-up with [name], Savannah Lacks, MD in 2 weeks. On follow-up patient will need a basic metabolic profile done to follow-up on electrolytes and renal function. Patient also need a magnesium level checked. Patient will benefit from referral to neurology for further management of her seizures. 2. Follow-up with Dr. Lindi Howard, hematology/oncology as scheduled.   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 coli UTI   Discharge Condition: Stable and improved  Diet recommendation: Regular  Filed Weights   04/17/17 2004 04/18/17 0349 04/19/17 0500  Weight: 84.1 kg (185 lb 6.5 oz) 86 kg (189 lb 9.5 oz) 88.1 kg (194 lb 3.6 oz)    History of present illness:  Savannah Howard is a 71 y.o. female with medical history significant of metastatic breast cancer with brain metastases, medical noncompliance, history of seizure disorder, hypertension, degenerative disc disease, gastroesophageal reflux disease  who presented to the ED via EMS for evaluation of acute onset of left-sided weakness and left facial twitch. Patient with significant dysarthric speech and a such history was also obtained from patient's health Of attorney who was at bedside. Healthcare power of attorney patient was seen at the foot of the bed after eating lunch and while talking suddenly had left facial twitching and drooling with left upper extremity numbness  and left-sided weakness. Patient was also noted to have significantly slurred speech. Twitching was ongoing. EMS was called and patient was brought to the ED. Symptoms started around 1 PM on the day of admission. Patient did endorse a headache one day prior to admission which resolved with Tylenol. Patient did endorse some  left-sided weakness, strong smell to the urine. Patient denies any fever, no chills, no nausea, no emesis, no chest pain, shortness of breath, no abdominal pain, no constipation, no dysuria, no productive cough. Patient does endorse chemotherapy-induced diarrhea which has since resolved. Patient actively twitching during examination.  ED Course: Patient was seen in the ED brought in as a code stroke. Comprehensive metabolic profile obtain had a potassium of 3.1, creatinine of 1.30 otherwise was within normal limits. Point-of-care troponin was 0.01. H&H was 11.9. CT head obtained showed tumor resection and treatment in the right temporal lobe has been stable. No acute abnormality. Patient was seen in consultation by neurology and patient was loaded with Keppra 1 g IV 1 as well as given Ativan 1 mg 1. Patient was placed on seizure precautions. It was felt patient likely had a seizure. [facility] hospitalists were called to admit the patient.    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 Patient was admitted placed on IV Keppra loading dose and then maintenance Keppra as well as IV Vimpat. Neurology was consulted and followed the patient throughout the hospitalization. Patient was also placed on Ativan as needed. On admission patient was noted to have continued facial twitching and subsequently placed in  the step down unit.  MRI of the brain negative for any acute CVA however did show stable metastatic disease. EEG was abnormal with findings suggestive of nonspecific generalized cerebral dysfunction, right frontal temporal  focal cerebral dysfunction suggesting underlying structural defect. Electrolytes repleted. Patient was loaded with IV Keppra on presentation. Patient improved clinically did not have any further facial twitching and a such IV Keppra was transitioned to oral Keppra. IV Vimpat was transitioned to oral Vimpat per neurology recommendations. Patient will be discharged home in stable and improved condition. Compliance was stressed patient. Outpatient follow-up with PCP with possible neurology referral.   #2 metastatic breast cancer to the brain MRI of the brain negative for any acute infarcts however does show chronic stable metastatic disease. Patient had presented with simple partial seizures likely etiology of seizures. Patient initially placed on IV Keppra and Vimpat and IV Decadron. Keppra of the implant were transitioned to oral versions. Decadron was changed to oral Decadron 4 mg by mouth twice a day. Patient was also seen by oncology during the hospitalization patient will follow-up in the outpatient setting as previously scheduled.   #3 gastroesophageal reflux disease Patient was maintained on PPI throughout the hospitalization. Patient's omeprazole dose was increased to 40 mg daily. Outpatient follow-up.  #4 dehydration Patient hydrated with IV fluids and was euvolemic by day of discharge.   #5 hypokalemia Repleted.  #6 hypertension Patient maintained on home regimen of Toprol. Patient's losartan HCTZ was not resumed and the hospitalization due to borderline blood pressure. Outpatient follow-up.   #7 dysarthria Likely secondary to problem #1 and 2. Resolved.  #8 anxiety Ativan when necessary.  #9 E Coli UTI Fosfomycin 3 gm x 1.  #10 left upper extremity swelling Patient was noted to have some left upper extremity swelling on the same side as her breast cancer. Venous Dopplers were obtained which were negative for DVT. Patient was asymptomatic. Outpatient  follow-up.  Procedures:  EEG 04/18/2017  CT head 04/17/2017  MRI brain with and without contrast 04/18/2017  Bilateral upper extremity Dopplers 04/20/2017  Consultations:  Neurology: Dr.Arora 04/17/2017   Discharge Exam: Vitals:   04/20/17 0915 04/20/17 1332  BP: 131/80 (!) 126/55  Pulse: 66 60  Resp: 20 20  Temp: 97.6 F (36.4 C) 97.9 F (36.6 C)  SpO2: 100% 100%    General: NAD Cardiovascular: RRR Respiratory: CTAB  Discharge Instructions   Discharge Instructions    Diet general    Complete by:  As directed    Increase activity slowly    Complete by:  As directed      Current Discharge Medication List    START taking these medications   Details  lacosamide 100 MG TABS Take 1 tablet (100 mg total) by mouth 2 (two) times daily. Qty: 60 tablet, Refills: 1    senna (SENOKOT) 8.6 MG TABS tablet Take 1 tablet (8.6 mg total) by mouth 2 (two) times daily. Qty: 120 each, Refills: 0      CONTINUE these medications which have CHANGED   Details  dexamethasone (DECADRON) 4 MG tablet Take 1 tablet (4 mg total) by mouth every 12 (twelve) hours. Qty: 60 tablet, Refills: 0    levETIRAcetam (KEPPRA) 500 MG tablet Take 1 tablet (500 mg total) by mouth 2 (two) times daily. Qty: 60 tablet, Refills: 1    omeprazole (PRILOSEC) 40 MG capsule Take 1 capsule (40 mg total) by mouth daily. Qty: 30 capsule, Refills: 1      CONTINUE these medications which  have NOT CHANGED   Details  acetaminophen (TYLENOL) 500 MG tablet Take 1,000 mg by mouth at bedtime as needed for mild pain or headache.     Cholecalciferol (VITAMIN D3) 2000 units capsule Take 1 capsule (2,000 Units total) by mouth daily. Qty: 100 capsule, Refills: 3    cyanocobalamin (,VITAMIN B-12,) 1000 MCG/ML injection INJECT 1 ML SUBCUTANEOUSLY EVERY 30 DAYS Qty: 1 mL, Refills: 3    LORazepam (ATIVAN) 0.5 MG tablet TAKE 1 TABLET BY MOUTH 3 TIMES A DAY AS NEEDED FOR ANXIETY Qty: 90 tablet, Refills: 3     magnesium gluconate (MAGONATE) 500 MG tablet Take 1 tablet (500 mg total) by mouth 2 (two) times daily. Qty: 60 tablet, Refills: 11    metoprolol succinate (TOPROL-XL) 50 MG 24 hr tablet TAKE 1/2 TABLET (25 MG TOTAL) BY MOUTH EVERY DAY Qty: 15 tablet, Refills: 3    ondansetron (ZOFRAN) 8 MG tablet Take 1 tablet (8 mg total) by mouth every 8 (eight) hours as needed for nausea or vomiting. 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 Penicillins Swelling and Other (See Comments)    Genital swelling/skin peeled/yeast infection Has patient had a PCN reaction causing immediate rash, facial/tongue/throat swelling, SOB or lightheadedness with hypotension: Yes Has patient had a PCN reaction causing severe rash involving mucus membranes or skin necrosis: Unknown Has patient had a PCN reaction that required hospitalization: Unknown Has patient had a PCN reaction occurring within the last 10 years: Unknown If all of the above answers are "NO", then may proceed with   . Sulfonamide Derivatives Other (See Comments)    Convulsions     Follow-up Information    [name], Savannah Lacks, MD. Schedule an appointment as soon as possible for a visit in 2 week(s).   Specialty:  Internal Medicine Contact information: [place] [address] [phone]        Nicholas Lose, MD Follow up.   Specialty:  Hematology and Oncology Why:  f/u as  scheduled. Contact information: [place] [address] [phone]            The results of significant diagnostics from this hospitalization (including imaging, microbiology, ancillary and laboratory) are listed below for reference.    Significant Diagnostic Studies: Mr Jeri Cos QQ Contrast  Result Date: 04/18/2017 CLINICAL DATA:  New onset LEFT-sided weakness and LEFT facial twitch. Follow-up brain metastasis. History of metastatic breast cancer with brain metastasis, seizure disorder, hypertension. EXAM: MRI HEAD WITHOUT AND WITH CONTRAST TECHNIQUE: Multiplanar, multiecho pulse sequences of the brain and surrounding structures were obtained without and with intravenous contrast. CONTRAST:  42mL MULTIHANCE GADOBENATE DIMEGLUMINE 529 MG/ML IV SOLN COMPARISON:  CT HEAD April 17, 2017 and MRI of the head March 21, 2017 and Jan 06, 2017 FINDINGS: INTRACRANIAL CONTENTS: No reduced diffusion to suggest acute ischemia or hypercellular tumor. Stable appearance of for cortical and dural based  metastasis, preponderance and RIGHT temporal lobe and dura, largest measuring 14 x 2.6 cm. Irregular RIGHT frontotemporal dural thickening subjacent to craniotomy. Stable cyst septa bright only be artifact RIGHT temporal resection cavity. Subcentimeter LEFT parietal metastasis is stable. RIGHT frontal developmental venous anomaly. Stable vasogenic edema and gliosis RIGHT temporal lobe. Ventricles and sulci are overall normal for patient's age. Patchy supratentorial white matter FLAIR T2 hyperintensities compatible with mild to moderate chronic small vessel ischemic disease. No abnormal extra-axial fluid collections. Extensive fall seen dural calcification. VASCULAR: Normal major intracranial vascular flow voids present at skull base. SKULL AND UPPER CERVICAL SPINE: No abnormal sellar expansion. RIGHT craniotomy. Craniocervical junction maintained. SINUSES/ORBITS: Trace RIGHT mastoid  effusion. Paranasal sinuses are well-aerated. The included ocular globes and orbital contents are non-suspicious. OTHER: None. IMPRESSION: 1. No acute intracranial process. 2. Stable number, size and appearance of multiple predominately RIGHT temporal parenchymal and dural-based metastasis in a background of treatment related changes. Electronically Signed   By: Elon Alas M.D.   On: 04/18/2017 05:40   Ct Head Code Stroke Wo Contrast  Result Date: 04/17/2017 CLINICAL DATA:  Code stroke. Altered level of consciousness. Right sided weakness and slurred speech. History of breast cancer metastatic to brain EXAM: CT HEAD WITHOUT CONTRAST TECHNIQUE: Contiguous axial images were obtained from the base of the skull through the vertex without intravenous contrast. COMPARISON:  MRI head 03/21/2017.  CT 01/06/2017 FINDINGS: Brain: Postop right temporal craniotomy for tumor resection. Hypodensity in the right temporal lobe similar to prior studies. No significant mass-effect on the temporal horn. No shift of the midline structures. Small metastatic deposit left parietal lobe noted on MRI and not seen on CT Negative for hemorrhage.  No acute infarct.  Ventricle size normal Vascular: Negative for hyperdense vessel Skull: Right temporal craniotomy.  No skull lesion identified Sinuses/Orbits: Negative Other: Negative ASPECTS ([place] Stroke Program Early CT Score) - Ganglionic level infarction (caudate, lentiform nuclei, internal capsule, insula, M1-M3 cortex): 7 - Supraganglionic infarction (M4-M6 cortex): 3 Total score (0-10 with 10 being normal): 10 IMPRESSION: 1. No acute abnormality. Tumor resection and treatment in the right temporal lobe appear stable. 2. ASPECTS is 10 These results were called by telephone at the time of interpretation on 04/17/2017 at 2:34 pm to Dr. Rory Percy, who verbally acknowledged these results. Electronically Signed   By: Franchot Gallo M.D.   On: 04/17/2017 14:35    Microbiology: Recent  Results (from the past 240 hour(s))  Urine Culture     Status: Abnormal   Collection Time: 04/17/17  7:51 PM  Result Value Ref Range Status   Specimen Description URINE, RANDOM  Final   Special Requests NONE  Final   Culture >=100,000 COLONIES/mL ESCHERICHIA COLI (A)  Final   Report Status 04/19/2017 FINAL  Final   Organism ID, Bacteria ESCHERICHIA COLI (A)  Final      Susceptibility   Escherichia coli - MIC*    AMPICILLIN <=2 SENSITIVE Sensitive     CEFAZOLIN <=4 SENSITIVE Sensitive     CEFTRIAXONE <=1 SENSITIVE Sensitive     CIPROFLOXACIN <=0.25 SENSITIVE Sensitive     GENTAMICIN <=1 SENSITIVE Sensitive     IMIPENEM <=0.25 SENSITIVE Sensitive     NITROFURANTOIN <=16 SENSITIVE Sensitive     TRIMETH/SULFA <=20 SENSITIVE Sensitive     AMPICILLIN/SULBACTAM <=2 SENSITIVE Sensitive     PIP/TAZO <=4 SENSITIVE Sensitive     Extended ESBL NEGATIVE Sensitive     * >=100,000 COLONIES/mL ESCHERICHIA COLI  MRSA PCR Screening  Status: None   Collection Time: 04/17/17  8:12 PM  Result Value Ref Range Status   MRSA by PCR NEGATIVE NEGATIVE Final    Comment:        The GeneXpert MRSA Assay (FDA approved for NASAL specimens only), is one component of a comprehensive MRSA colonization surveillance program. It is not intended to diagnose MRSA infection nor to guide or monitor treatment for MRSA infections.      Labs: Basic Metabolic Panel:  Recent Labs Lab 04/16/17 0951 04/17/17 1412 04/17/17 1423 04/17/17 2038 04/18/17 0536 04/18/17 0845 04/19/17 0340 04/20/17 0507  NA 137 137 138  --  137  --  137 137  K 2.7* 3.1* 3.0*  --  4.2  --  3.9 3.9  CL  --  100* 98*  --  104  --  106 107  CO2 22 24  --   --  24  --  24 22  GLUCOSE 99 132* 133*  --  142*  --  175* 143*  BUN 22.1 15 17   --  13  --  15 19  CREATININE 1.8* 1.30* 1.10*  --  0.95  --  1.09* 1.15*  CALCIUM 9.2 8.4*  --   --  8.0*  --  8.2* 7.8*  MG  --   --   --  1.1*  --  1.0* 2.0 1.5*   Liver Function  Tests:  Recent Labs Lab 04/16/17 0951 04/17/17 1412 04/18/17 0536  AST 11 21 11*  ALT <6 10* 9*  ALKPHOS 62 49 45  BILITOT 0.74 1.0 0.7  PROT 6.3* 5.5* 5.3*  ALBUMIN 2.6* 2.5* 2.3*   No results for input(s): LIPASE, AMYLASE in the last 168 hours. No results for input(s): AMMONIA in the last 168 hours. CBC:  Recent Labs Lab 04/16/17 0951 04/17/17 1423 04/17/17 2038 04/18/17 0536 04/19/17 0340  WBC 10.2  --  8.7 4.7 8.3  NEUTROABS 7.0*  --  6.7  --   --   HGB 12.3 11.9* 11.8* 10.5* 10.1*  HCT 35.8 35.0* 35.3* 31.6* 31.7*  MCV 100.8  --  102.3* 102.3* 102.9*  PLT 365  --  308 308 345   Cardiac Enzymes: No results for input(s): CKTOTAL, CKMB, CKMBINDEX, TROPONINI in the last 168 hours. BNP: BNP (last 3 results)  Recent Labs  07/10/16 0420  BNP 222.2*    ProBNP (last 3 results) No results for input(s): PROBNP in the last 8760 hours.  CBG:  Recent Labs Lab 04/17/17 1402  GLUCAP 134*       Signed:  THOMPSON,DANIEL MD.  [facility] 04/20/2017, 4:50 PM

## 2017-04-22 ENCOUNTER — Other Ambulatory Visit: Payer: Self-pay | Admitting: Hematology and Oncology

## 2017-04-22 ENCOUNTER — Other Ambulatory Visit: Payer: Self-pay

## 2017-04-22 ENCOUNTER — Telehealth: Payer: Self-pay | Admitting: *Deleted

## 2017-04-22 DIAGNOSIS — E876 Hypokalemia: Secondary | ICD-10-CM | POA: Diagnosis not present

## 2017-04-22 DIAGNOSIS — K219 Gastro-esophageal reflux disease without esophagitis: Secondary | ICD-10-CM | POA: Diagnosis not present

## 2017-04-22 DIAGNOSIS — C7931 Secondary malignant neoplasm of brain: Secondary | ICD-10-CM | POA: Diagnosis not present

## 2017-04-22 DIAGNOSIS — R5383 Other fatigue: Secondary | ICD-10-CM | POA: Diagnosis not present

## 2017-04-22 DIAGNOSIS — N182 Chronic kidney disease, stage 2 (mild): Secondary | ICD-10-CM | POA: Diagnosis not present

## 2017-04-22 DIAGNOSIS — E538 Deficiency of other specified B group vitamins: Secondary | ICD-10-CM | POA: Diagnosis not present

## 2017-04-22 DIAGNOSIS — R2681 Unsteadiness on feet: Secondary | ICD-10-CM | POA: Diagnosis not present

## 2017-04-22 DIAGNOSIS — C50912 Malignant neoplasm of unspecified site of left female breast: Secondary | ICD-10-CM

## 2017-04-22 DIAGNOSIS — C50412 Malignant neoplasm of upper-outer quadrant of left female breast: Secondary | ICD-10-CM | POA: Diagnosis not present

## 2017-04-22 DIAGNOSIS — N183 Chronic kidney disease, stage 3 (moderate): Secondary | ICD-10-CM | POA: Diagnosis not present

## 2017-04-22 DIAGNOSIS — B9620 Unspecified Escherichia coli [E. coli] as the cause of diseases classified elsewhere: Secondary | ICD-10-CM | POA: Diagnosis not present

## 2017-04-22 DIAGNOSIS — F419 Anxiety disorder, unspecified: Secondary | ICD-10-CM | POA: Diagnosis not present

## 2017-04-22 DIAGNOSIS — G40909 Epilepsy, unspecified, not intractable, without status epilepticus: Secondary | ICD-10-CM | POA: Diagnosis not present

## 2017-04-22 DIAGNOSIS — E559 Vitamin D deficiency, unspecified: Secondary | ICD-10-CM | POA: Diagnosis not present

## 2017-04-22 DIAGNOSIS — E785 Hyperlipidemia, unspecified: Secondary | ICD-10-CM | POA: Diagnosis not present

## 2017-04-22 DIAGNOSIS — D649 Anemia, unspecified: Secondary | ICD-10-CM | POA: Diagnosis not present

## 2017-04-22 DIAGNOSIS — I10 Essential (primary) hypertension: Secondary | ICD-10-CM | POA: Diagnosis not present

## 2017-04-22 DIAGNOSIS — C50911 Malignant neoplasm of unspecified site of right female breast: Secondary | ICD-10-CM | POA: Diagnosis not present

## 2017-04-22 DIAGNOSIS — R262 Difficulty in walking, not elsewhere classified: Secondary | ICD-10-CM | POA: Diagnosis not present

## 2017-04-22 DIAGNOSIS — N390 Urinary tract infection, site not specified: Secondary | ICD-10-CM | POA: Diagnosis not present

## 2017-04-22 DIAGNOSIS — G40109 Localization-related (focal) (partial) symptomatic epilepsy and epileptic syndromes with simple partial seizures, not intractable, without status epilepticus: Secondary | ICD-10-CM | POA: Diagnosis not present

## 2017-04-22 DIAGNOSIS — I129 Hypertensive chronic kidney disease with stage 1 through stage 4 chronic kidney disease, or unspecified chronic kidney disease: Secondary | ICD-10-CM | POA: Diagnosis not present

## 2017-04-22 DIAGNOSIS — Z170 Estrogen receptor positive status [ER+]: Secondary | ICD-10-CM | POA: Diagnosis not present

## 2017-04-22 DIAGNOSIS — Z79811 Long term (current) use of aromatase inhibitors: Secondary | ICD-10-CM | POA: Diagnosis not present

## 2017-04-22 DIAGNOSIS — M6281 Muscle weakness (generalized): Secondary | ICD-10-CM | POA: Diagnosis not present

## 2017-04-22 DIAGNOSIS — E860 Dehydration: Secondary | ICD-10-CM | POA: Diagnosis not present

## 2017-04-22 DIAGNOSIS — Z5112 Encounter for antineoplastic immunotherapy: Secondary | ICD-10-CM | POA: Diagnosis not present

## 2017-04-22 DIAGNOSIS — R488 Other symbolic dysfunctions: Secondary | ICD-10-CM | POA: Diagnosis not present

## 2017-04-22 DIAGNOSIS — Z5111 Encounter for antineoplastic chemotherapy: Secondary | ICD-10-CM | POA: Diagnosis not present

## 2017-04-22 MED ORDER — LIDOCAINE-PRILOCAINE 2.5-2.5 % EX CREA: TOPICAL_CREAM | CUTANEOUS | 3 refills | Status: DC

## 2017-04-22 NOTE — Telephone Encounter (Signed)
called pt/friend ([name]) to set pt up for hosp f/u. No answer LMOM RTC...Savannah Howard

## 2017-04-23 ENCOUNTER — Non-Acute Institutional Stay (SKILLED_NURSING_FACILITY): Payer: Medicare Other | Admitting: Internal Medicine

## 2017-04-23 DIAGNOSIS — F419 Anxiety disorder, unspecified: Secondary | ICD-10-CM

## 2017-04-23 DIAGNOSIS — C50412 Malignant neoplasm of upper-outer quadrant of left female breast: Secondary | ICD-10-CM | POA: Diagnosis not present

## 2017-04-23 DIAGNOSIS — E559 Vitamin D deficiency, unspecified: Secondary | ICD-10-CM | POA: Diagnosis not present

## 2017-04-23 DIAGNOSIS — I129 Hypertensive chronic kidney disease with stage 1 through stage 4 chronic kidney disease, or unspecified chronic kidney disease: Secondary | ICD-10-CM | POA: Diagnosis not present

## 2017-04-23 DIAGNOSIS — C7931 Secondary malignant neoplasm of brain: Secondary | ICD-10-CM | POA: Diagnosis not present

## 2017-04-23 DIAGNOSIS — E876 Hypokalemia: Secondary | ICD-10-CM | POA: Diagnosis not present

## 2017-04-23 DIAGNOSIS — B9620 Unspecified Escherichia coli [E. coli] as the cause of diseases classified elsewhere: Secondary | ICD-10-CM | POA: Diagnosis not present

## 2017-04-23 DIAGNOSIS — I10 Essential (primary) hypertension: Secondary | ICD-10-CM | POA: Diagnosis not present

## 2017-04-23 DIAGNOSIS — K219 Gastro-esophageal reflux disease without esophagitis: Secondary | ICD-10-CM

## 2017-04-23 DIAGNOSIS — E860 Dehydration: Secondary | ICD-10-CM

## 2017-04-23 DIAGNOSIS — C50911 Malignant neoplasm of unspecified site of right female breast: Secondary | ICD-10-CM

## 2017-04-23 DIAGNOSIS — G40909 Epilepsy, unspecified, not intractable, without status epilepticus: Secondary | ICD-10-CM | POA: Diagnosis not present

## 2017-04-23 DIAGNOSIS — N182 Chronic kidney disease, stage 2 (mild): Secondary | ICD-10-CM | POA: Diagnosis not present

## 2017-04-23 DIAGNOSIS — N390 Urinary tract infection, site not specified: Secondary | ICD-10-CM

## 2017-04-23 DIAGNOSIS — G40109 Localization-related (focal) (partial) symptomatic epilepsy and epileptic syndromes with simple partial seizures, not intractable, without status epilepticus: Secondary | ICD-10-CM | POA: Diagnosis not present

## 2017-04-23 NOTE — Progress Notes (Signed)
: Provider:  Noah Delaine. Sheppard Coil, MD Location:  [place] Room Number: 110 Place of Service:  SNF (31)  PCP: [name], [name], MD Patient Care Team: [name], [name], MD as PCP - General  Extended Emergency Contact Information Primary Emergency Contact: Moore,Jeannie Address: [place], [place] Mobile Phone: [phone] Relation: Friend     Allergies: Penicillins and Sulfonamide derivatives  Chief Complaint  Patient presents with  . New Admit To SNF    following hospitalization 04/17/17 to 04/20/17 simple partial seizures    HPI: Patient is 71 y.o. female with metastatic breast cancer with brain metastases, medical noncompliance, history of seizure disorder, hypertension, degenerative disc disease, GERD, who presented to [facility] emergency Department via EMS for acute onset of left-sided weakness and left facial twitch. Power of attorney was seen at the foot of patient's bed after eating lunch and while talking suddenly had left facial twitching and drooling with left upper extremity numbness and left-sided weakness. Patient was also noted to have significantly  slurred speech with ongoing twitching. Patient did admit to headache 1 day prior to admission which resolved with Tylenol patient denied any fever chills nausea or emesis chest pain shortness of breath abdominal pain constipation dysuria or productive cough. Patient does admit to chemotherapy-induced diarrhea which has since resolved. Patient was initially seen as a code stroke, but after neurology was consulted it was felt that this hemiparesis represented a seizure and patient was loaded with Keppra 1 g IV as well as given Ativan 1 mg IV. Patient was then admitted to Hospital from 9/8-11. Patient was treated with IV Keppra and Vimpat as well as IV Decadron which are both converted to orals. Her course was complicated by dehydration and hypokalemia  which improved with hydration. Patient was also found to have an Escherichia coli UTI which was treated with fosfomycin 3 g 1 dose. Patient is admitted to skilled nursing facility with generalized weakness for OT/PT. While at skilled nursing facility patient will be followed for hypertension treated with Toprol, GERD treated with omeprazole and an anxiety treated with Ativan.  Past Medical History:  Diagnosis Date  . 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 Allergy    rhinitis  . Anemia   . Antineoplastic chemotherapy induced anemia 04/02/2015  . Anxiety   . B12 deficiency 10/22/2008   Chronic    . Brain cancer ([place]) 06/2016   breast ca with mets to brain  . Brain tumor ([place])   . Breast cancer metastasized to brain [facility]) 07/18/2016   Dr Tammi Klippel - XRT Dr Lindi Adie Dr Ditty S/p craniotomy 12/17  . Breast cancer of upper-outer quadrant of left female breast ([place]) 12/20/2014  . Chemotherapy induced diarrhea 01/08/2015   x5 weeks   Questran Lomotil Consider a probiotic C diff stool test   . DDD (degenerative disc disease), lumbar dx'd in 1980's  . GERD 05/09/2007   Chronic     . GERD (gastroesophageal reflux disease)   . Heart murmur   . History of blood transfusion 2016 X 3 (05/30/2015)  . Hypertension   . Hypomagnesemia 01/07/2017  . Kidney stone 1990's X 1   "passed it"  . Knee pain, left 07/18/2016  . Malignant brain tumor ([place]) 07/18/2016  . Mucositis due to chemotherapy 01/08/2015  . Obesity   . Primary cancer of upper outer quadrant of left breast ([place]) dx'd  03/2015  . Primary malignant neoplasm of left breast with metastasis to other site [facility])   . Rhabdomyolysis 07/10/2016  . Seizure ([place])   . Seizure disorder ([place]) 01/07/2017   Due to brain mets On Keppra  . Syncope 07/10/2016  . Vitamin B 12 deficiency   . Vitamin D deficiency   . Vitamin D deficiency 04/24/2009   Chronic       Past Surgical History:  Procedure Laterality Date  . APPLICATION OF CRANIAL NAVIGATION N/A  07/13/2016   Procedure: APPLICATION OF CRANIAL NAVIGATION;  Surgeon: Kevan Ny Ditty, MD;  Location: [place];  Service: Neurosurgery;  Laterality: N/A;  . BREAST BIOPSY Left ~ 12/2014  . CHOLECYSTECTOMY OPEN  ~ 1975  . ESOPHAGEAL DILATION  X 3  . MASTECTOMY COMPLETE / SIMPLE W/ SENTINEL NODE BIOPSY Left 05/30/2015  . MASTECTOMY W/ SENTINEL NODE BIOPSY Left 05/30/2015   Procedure: LEFT MASTECTOMY WITH SENTINEL LYMPH NODE BIOPSY;  Surgeon: Autumn Messing III, MD;  Location: [place];  Service: General;  Laterality: Left;  . PORTACATH PLACEMENT Right 12/26/2014   Procedure: INSERTION PORT-A-CATH;  Surgeon: Autumn Messing III, MD;  Location: [place];  Service: General;  Laterality: Right;  . PR DURAL GRAFT REPAIR,SPINE DEFECT N/A 07/13/2016   Procedure: Marshia Ly STERIOTACTIC BIOPSY WITH STEALTH;  Surgeon: Kevan Ny Ditty, MD;  Location: [place];  Service: Neurosurgery;  Laterality: N/A;    Allergies as of 04/23/2017      Reactions   Penicillins Swelling, Other (See Comments)   Genital swelling/skin peeled/yeast infection Has patient had a PCN reaction causing immediate rash, facial/tongue/throat swelling, SOB or lightheadedness with hypotension: Yes Has patient had a PCN reaction causing severe rash involving mucus membranes or skin necrosis: Unknown Has patient had a PCN reaction that required hospitalization: Unknown Has patient had a PCN reaction occurring within the last 10 years: Unknown If all of the above answers are "NO", then may proceed with    Sulfonamide Derivatives Other (See Comments)   Convulsions      Medication List       Accurate as of 04/23/17 10:47 AM. Always use your most recent med list.          acetaminophen 500 MG tablet Commonly known as:  TYLENOL Take 1,000 mg by mouth at bedtime as needed for mild pain or headache.   cyanocobalamin 1000 MCG/ML injection Commonly known as:  (VITAMIN B-12) INJECT 1 ML SUBCUTANEOUSLY EVERY 30 DAYS   dexamethasone 4  MG tablet Commonly known as:  DECADRON Take 1 tablet (4 mg total) by mouth every 12 (twelve) hours.   Lacosamide 100 MG Tabs Take 1 tablet (100 mg total) by mouth 2 (two) times daily.   levETIRAcetam 500 MG tablet Commonly known as:  KEPPRA Take 1 tablet (500 mg total) by mouth 2 (two) times daily.   LORazepam 0.5 MG tablet Commonly known as:  ATIVAN TAKE 1 TABLET BY MOUTH 3 TIMES A DAY AS NEEDED FOR ANXIETY   magnesium gluconate 500 MG tablet Commonly known as:  MAGONATE Take 1 tablet (500 mg total) by mouth 2 (two) times daily.   metoprolol succinate 50 MG 24 hr tablet Commonly known as:  TOPROL-XL TAKE 1/2 TABLET (25 MG TOTAL) BY MOUTH EVERY DAY   omeprazole 40 MG capsule Commonly known as:  PRILOSEC Take 1 capsule (40 mg total) by mouth daily.   ondansetron 8 MG tablet Commonly known as:  ZOFRAN Take 1 tablet (8 mg total) by mouth every 8 (  eight) hours as needed for nausea or vomiting.   senna 8.6 MG Tabs tablet Commonly known as:  SENOKOT Take 1 tablet (8.6 mg total) by mouth 2 (two) times daily.   Vitamin D3 2000 units capsule Take 1 capsule (2,000 Units total) by mouth daily.       No orders of the defined types were placed in this encounter.   Immunization History  Administered Date(s) Administered  . PPD Test 07/16/2016, 04/22/2017  . Pneumococcal Polysaccharide-23 01/22/2017  . Tdap 11/05/2014    Social History  Substance Use Topics  . Smoking status: Never Smoker  . Smokeless tobacco: Never Used  . Alcohol use Yes     Comment: 05/30/2015 "I might take a drink of wine 1-2 times/yr"    Family history is   Family History  Problem Relation Age of Onset  . Heart disease Mother        CAD  . COPD Mother   . Heart disease Father 18       leaky valve  . Parkinsonism Father   . Hypertension Other       Review of Systems  DATA OBTAINED: from patient, nurse GENERAL:  no fevers, fatigue, appetite changes SKIN: No itching, or rash EYES: No  eye pain, redness, discharge EARS: No earache, tinnitus, change in hearing NOSE: No congestion, drainage or bleeding  MOUTH/THROAT: No mouth or tooth pain, No sore throat RESPIRATORY: No cough, wheezing, SOB CARDIAC: No chest pain, palpitations, lower extremity edema  GI: No abdominal pain, No N/V/D or constipation, No heartburn or reflux  GU: No dysuria, frequency or urgency, or incontinence  MUSCULOSKELETAL: No unrelieved bone/joint pain NEUROLOGIC: A little headache not bed, same as in hospital, no dizziness or focal weakness PSYCHIATRIC: No c/o anxiety or sadness   Vitals:   04/23/17 1032  BP: 116/76  Pulse: 64  Resp: 18  Temp: 98.1 F (36.7 C)  SpO2: 97%    SpO2 Readings from Last 1 Encounters:  04/23/17 97%   Body mass index is 35.48 kg/m.     Physical Exam  GENERAL APPEARANCE: Alert, conversant,  No acute distress.  SKIN: No diaphoresis rash HEAD: Normocephalic, atraumatic  EYES: Conjunctiva/lids clear. Pupils round, reactive. EOMs intact.  EARS: External exam WNL, canals clear. Hearing grossly normal.  NOSE: No deformity or discharge.  MOUTH/THROAT: Lips w/o lesions  RESPIRATORY: Breathing is even, unlabored. Lung sounds are clear   CARDIOVASCULAR: Heart RRR no murmurs, rubs or gallops. LUE peripheral edema.   GASTROINTESTINAL: Abdomen is soft, non-tender, not distended w/ normal bowel sounds. GENITOURINARY: Bladder non tender, not distended  MUSCULOSKELETAL: No abnormal joints or musculature NEUROLOGIC:  Cranial nerves 2-12 grossly intact. Moves all extremities  PSYCHIATRIC: Mood and affect appropriate to situation, no behavioral issues  Patient Active Problem List   Diagnosis Date Noted  . E. coli UTI 04/19/2017  . Simple partial seizures ([place]) 04/18/2017  . Seizures ([place]) 04/17/2017  . Primary malignant neoplasm of left breast with metastasis to other site [facility])   . Seizure ([place])   . Hypokalemia 01/07/2017  . Hypomagnesemia 01/07/2017  .  Hyperglycemia 01/07/2017  . Seizure disorder ([place]) 01/07/2017  . Altered mental status 01/06/2017  . Palliative care by specialist   . Malignant brain tumor ([place]) 07/18/2016  . Breast cancer metastasized to brain ([place]) 07/18/2016  . Knee pain, left 07/18/2016  . Pressure injury of skin 07/11/2016  . Fall 07/10/2016  . UTI (urinary tract infection) 07/10/2016  . Rhabdomyolysis 07/10/2016  . Syncope  07/10/2016  . 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 Sepsis ([place]) 07/10/2016  . Urinary tract infection with hematuria   . Brain tumor ([place])   . Antineoplastic chemotherapy induced anemia 04/02/2015  . Weakness generalized 01/29/2015  . Dehydration 01/28/2015  . Rash 01/28/2015  . Chemotherapy induced diarrhea 01/08/2015  . Mucositis due to chemotherapy 01/08/2015  . Breast cancer of upper-outer quadrant of left female breast ([place]) 12/20/2014  . Well adult exam 10/26/2012  . Dyslipidemia 06/23/2011  . Hypertension 12/22/2010  . UPPER RESPIRATORY INFECTION, ACUTE 04/30/2010  . Anxiety 10/28/2009  . Vitamin D deficiency 04/24/2009  . HYPERGLYCEMIA 04/24/2009  . B12 deficiency 10/22/2008  . ALLERGIC RHINITIS 05/09/2007  . GERD 05/09/2007      Labs reviewed: Basic Metabolic Panel:    Component Value Date/Time   NA 137 04/20/2017 0507   NA 137 04/16/2017 0951   K 3.9 04/20/2017 0507   K 2.7 (LL) 04/16/2017 0951   CL 107 04/20/2017 0507   CO2 22 04/20/2017 0507   CO2 22 04/16/2017 0951   GLUCOSE 143 (H) 04/20/2017 0507   GLUCOSE 99 04/16/2017 0951   GLUCOSE 112 (H) 07/15/2006 0921   BUN 19 04/20/2017 0507   BUN 22.1 04/16/2017 0951   CREATININE 1.15 (H) 04/20/2017 0507   CREATININE 1.8 (H) 04/16/2017 0951   CALCIUM 7.8 (L) 04/20/2017 0507   CALCIUM 9.2 04/16/2017 0951   PROT 5.3 (L) 04/18/2017 0536   PROT 6.3 (L) 04/16/2017 0951   ALBUMIN 2.3 (L) 04/18/2017 0536   ALBUMIN 2.6 (L) 04/16/2017 0951   AST 11 (L) 04/18/2017 0536   AST 11 04/16/2017 0951   ALT 9  (L) 04/18/2017 0536   ALT <6 04/16/2017 0951   ALKPHOS 45 04/18/2017 0536   ALKPHOS 62 04/16/2017 0951   BILITOT 0.7 04/18/2017 0536   BILITOT 0.74 04/16/2017 0951   GFRNONAA 46 (L) 04/20/2017 0507   GFRAA 54 (L) 04/20/2017 0507     Recent Labs  04/18/17 0536 04/18/17 0845 04/19/17 0340 04/20/17 0507  NA 137  --  137 137  K 4.2  --  3.9 3.9  CL 104  --  106 107  CO2 24  --  24 22  GLUCOSE 142*  --  175* 143*  BUN 13  --  15 19  CREATININE 0.95  --  1.09* 1.15*  CALCIUM 8.0*  --  8.2* 7.8*  MG  --  1.0* 2.0 1.5*   Liver Function Tests:  Recent Labs  04/16/17 0951 04/17/17 1412 04/18/17 0536  AST 11 21 11*  ALT <6 10* 9*  ALKPHOS 62 49 45  BILITOT 0.74 1.0 0.7  PROT 6.3* 5.5* 5.3*  ALBUMIN 2.6* 2.5* 2.3*   No results for input(s): LIPASE, AMYLASE in the last 8760 hours. No results for input(s): AMMONIA in the last 8760 hours. CBC:  Recent Labs  04/02/17 1218 04/16/17 0951  04/17/17 2038 04/18/17 0536 04/19/17 0340  WBC 10.4* 10.2  --  8.7 4.7 8.3  NEUTROABS 6.6* 7.0*  --  6.7  --   --   HGB 13.1 12.3  < > 11.8* 10.5* 10.1*  HCT 38.2 35.8  < > 35.3* 31.6* 31.7*  MCV 98.7 100.8  --  102.3* 102.3* 102.9*  PLT 427* 365  --  308 308 345  < > = values in this interval not displayed. Lipid No results for input(s): CHOL, HDL, LDLCALC, TRIG in the last 8760 hours.  Cardiac Enzymes:  Recent Labs  07/10/16 0420 07/11/16 0625 07/13/16 0528  CKTOTAL 9,810* 3,599* 2,244*  TROPONINI 0.78*  --   --    BNP:  Recent Labs  07/10/16 0420  BNP 222.2*   No results found for: [facility] Lab Results  Component Value Date   HGBA1C 6.3 02/03/2017   Lab Results  Component Value Date   TSH 0.35 02/03/2017   Lab Results  Component Value Date   VITAMINB12 1,161 (H) 02/03/2017   No results found for: FOLATE No results found for: IRON, TIBC, FERRITIN  Imaging and Procedures obtained prior to SNF admission: Mr Jeri Cos Wo Contrast  Result Date:  04/18/2017 CLINICAL DATA:  New onset LEFT-sided weakness and LEFT facial twitch. Follow-up brain metastasis. History of metastatic breast cancer with brain metastasis, seizure disorder, hypertension. EXAM: MRI HEAD WITHOUT AND WITH CONTRAST TECHNIQUE: Multiplanar, multiecho pulse sequences of the brain and surrounding structures were obtained without and with intravenous contrast. CONTRAST:  78mL MULTIHANCE GADOBENATE DIMEGLUMINE 529 MG/ML IV SOLN COMPARISON:  CT HEAD April 17, 2017 and MRI of the head March 21, 2017 and Jan 06, 2017 FINDINGS: INTRACRANIAL CONTENTS: No reduced diffusion to suggest acute ischemia or hypercellular tumor. Stable appearance of for cortical and dural based metastasis, preponderance and RIGHT temporal lobe and dura, largest measuring 14 x 2.6 cm. Irregular RIGHT frontotemporal dural thickening subjacent to craniotomy. Stable cyst septa bright only be artifact RIGHT temporal resection cavity. Subcentimeter LEFT parietal metastasis is stable. RIGHT frontal developmental venous anomaly. Stable vasogenic edema and gliosis RIGHT temporal lobe. Ventricles and sulci are overall normal for patient's age. Patchy supratentorial white matter FLAIR T2 hyperintensities compatible with mild to moderate chronic small vessel ischemic disease. No abnormal extra-axial fluid collections. Extensive fall seen dural calcification. VASCULAR: Normal major intracranial vascular flow voids present at skull base. SKULL AND UPPER CERVICAL SPINE: No abnormal sellar expansion. RIGHT craniotomy. Craniocervical junction maintained. SINUSES/ORBITS: Trace RIGHT mastoid effusion. Paranasal sinuses are well-aerated. The included ocular globes and orbital contents are non-suspicious. OTHER: None. IMPRESSION: 1. No acute intracranial process. 2. Stable number, size and appearance of multiple predominately RIGHT temporal parenchymal and dural-based metastasis in a background of treatment related changes. Electronically  Signed   By: Elon Alas M.D.   On: 04/18/2017 05:40   Ct Head Code Stroke Wo Contrast  Result Date: 04/17/2017 CLINICAL DATA:  Code stroke. Altered level of consciousness. Right sided weakness and slurred speech. History of breast cancer metastatic to brain EXAM: CT HEAD WITHOUT CONTRAST TECHNIQUE: Contiguous axial images were obtained from the base of the skull through the vertex without intravenous contrast. COMPARISON:  MRI head 03/21/2017.  CT 01/06/2017 FINDINGS: Brain: Postop right temporal craniotomy for tumor resection. Hypodensity in the right temporal lobe similar to prior studies. No significant mass-effect on the temporal horn. No shift of the midline structures. Small metastatic deposit left parietal lobe noted on MRI and not seen on CT Negative for hemorrhage.  No acute infarct.  Ventricle size normal Vascular: Negative for hyperdense vessel Skull: Right temporal craniotomy.  No skull lesion identified Sinuses/Orbits: Negative Other: Negative ASPECTS ([place] Stroke Program Early CT Score) - Ganglionic level infarction (caudate, lentiform nuclei, internal capsule, insula, M1-M3 cortex): 7 - Supraganglionic infarction (M4-M6 cortex): 3 Total score (0-10 with 10 being normal): 10 IMPRESSION: 1. No acute abnormality. Tumor resection and treatment in the right temporal lobe appear stable. 2. ASPECTS is 10 These results were called by telephone at the time of interpretation on 04/17/2017 at 2:34 pm to Dr. Rory Percy, who  verbally acknowledged these results. Electronically Signed   By: Franchot Gallo M.D.   On: 04/17/2017 14:35     Not all labs, radiology exams or other studies done during hospitalization come through on my EPIC note; however they are reviewed by me.    Assessment and Plan  SIMPLE PARTIAL SEIZURE-patient was admitted to hospital and loaded on IV Troponin maintain on Keppra as well as Vimpat; MRI of brain negative for acute CVA, did show stable metastatic disease. Patient is to  be discharged with by mouth) by mouth Vimpat SNF - admitted with generalized weakness for OT/PT; plan to continue Vimpat 100 mg by mouth twice a day and Keppra 500 mg by mouth twice a day  BREAST CANCER METASTATIC TO THE BRAIN-etiology of seizure; hospital patient was also placed on IV Decadron and then transition to oral Decadron SNF - continue oral Decadron 4 mg twice a day; patient to follow-up with oncology as previously scheduled  GERD-patien on the Prozac dose was increased from 20 mg to 40 mg SNF - continue Prilosec 40 mg by mouth daily  DEHYDRATION/HYPOKALEMIA-with IV fluids and repletion of potassium SNF - will follow-up BMP for potassium level and kidney function  E COLI UTI-treated with one dose of fosfomycin 3 g  HYPERTENSION-patient's losartan-hydrochlorothiazide was stopped during hospitalization due to borderline blood pressure SNF - will monitor his blood pressure every shift; will continue Toprol-XL 25 mg by mouth daily  ANXIETY SNF - continue Ativan 0.5 mg 3 times a day when necessary  VITAMIN D DEFICIENCY SNF - continue vitamin D replacement 2000 units by mouth daily    Time spent greater than 45 minutes;> 50% of time with patient was spent reviewing records, labs, tests and studies, counseling and developing plan of care  Webb Silversmith D. Sheppard Coil, MD

## 2017-04-25 ENCOUNTER — Encounter: Payer: Self-pay | Admitting: Internal Medicine

## 2017-04-26 ENCOUNTER — Telehealth: Payer: Self-pay | Admitting: Hematology and Oncology

## 2017-04-26 ENCOUNTER — Ambulatory Visit (HOSPITAL_BASED_OUTPATIENT_CLINIC_OR_DEPARTMENT_OTHER): Payer: Medicare Other

## 2017-04-26 ENCOUNTER — Other Ambulatory Visit (HOSPITAL_BASED_OUTPATIENT_CLINIC_OR_DEPARTMENT_OTHER): Payer: Medicare Other

## 2017-04-26 ENCOUNTER — Ambulatory Visit: Payer: Medicare Other

## 2017-04-26 ENCOUNTER — Ambulatory Visit (HOSPITAL_BASED_OUTPATIENT_CLINIC_OR_DEPARTMENT_OTHER): Payer: Medicare Other | Admitting: Hematology and Oncology

## 2017-04-26 VITALS — BP 149/74 | HR 53 | Temp 98.9°F | Resp 20 | Ht 62.0 in | Wt 189.9 lb

## 2017-04-26 DIAGNOSIS — C50912 Malignant neoplasm of unspecified site of left female breast: Secondary | ICD-10-CM

## 2017-04-26 DIAGNOSIS — Z170 Estrogen receptor positive status [ER+]: Principal | ICD-10-CM

## 2017-04-26 DIAGNOSIS — Z5111 Encounter for antineoplastic chemotherapy: Secondary | ICD-10-CM

## 2017-04-26 DIAGNOSIS — Z5112 Encounter for antineoplastic immunotherapy: Secondary | ICD-10-CM | POA: Diagnosis not present

## 2017-04-26 DIAGNOSIS — Z79811 Long term (current) use of aromatase inhibitors: Secondary | ICD-10-CM | POA: Diagnosis not present

## 2017-04-26 DIAGNOSIS — E860 Dehydration: Secondary | ICD-10-CM

## 2017-04-26 DIAGNOSIS — T451X5A Adverse effect of antineoplastic and immunosuppressive drugs, initial encounter: Secondary | ICD-10-CM

## 2017-04-26 DIAGNOSIS — C50412 Malignant neoplasm of upper-outer quadrant of left female breast: Secondary | ICD-10-CM

## 2017-04-26 DIAGNOSIS — K521 Toxic gastroenteritis and colitis: Secondary | ICD-10-CM

## 2017-04-26 DIAGNOSIS — M6281 Muscle weakness (generalized): Secondary | ICD-10-CM | POA: Diagnosis not present

## 2017-04-26 DIAGNOSIS — C7931 Secondary malignant neoplasm of brain: Secondary | ICD-10-CM

## 2017-04-26 DIAGNOSIS — R5383 Other fatigue: Secondary | ICD-10-CM | POA: Diagnosis not present

## 2017-04-26 LAB — CBC WITH DIFFERENTIAL/PLATELET
BASO%: 1.4 % (ref 0.0–2.0)
BASOS ABS: 0.2 10*3/uL — AB (ref 0.0–0.1)
EOS ABS: 0 10*3/uL (ref 0.0–0.5)
EOS%: 0 % (ref 0.0–7.0)
HCT: 36 % (ref 34.8–46.6)
HGB: 12.4 g/dL (ref 11.6–15.9)
LYMPH%: 22 % (ref 14.0–49.7)
MCH: 34.5 pg — AB (ref 25.1–34.0)
MCHC: 34.4 g/dL (ref 31.5–36.0)
MCV: 100.3 fL (ref 79.5–101.0)
MONO#: 1.3 10*3/uL — ABNORMAL HIGH (ref 0.1–0.9)
MONO%: 10.1 % (ref 0.0–14.0)
NEUT#: 8.3 10*3/uL — ABNORMAL HIGH (ref 1.5–6.5)
NEUT%: 66.5 % (ref 38.4–76.8)
Platelets: 317 10*3/uL (ref 145–400)
RBC: 3.59 10*6/uL — AB (ref 3.70–5.45)
RDW: 15 % — ABNORMAL HIGH (ref 11.2–14.5)
WBC: 12.5 10*3/uL — ABNORMAL HIGH (ref 3.9–10.3)
lymph#: 2.8 10*3/uL (ref 0.9–3.3)

## 2017-04-26 LAB — COMPREHENSIVE METABOLIC PANEL
ALK PHOS: 53 U/L (ref 40–150)
ALT: 19 U/L (ref 0–55)
AST: 11 U/L (ref 5–34)
Albumin: 3.2 g/dL — ABNORMAL LOW (ref 3.5–5.0)
Anion Gap: 17 mEq/L — ABNORMAL HIGH (ref 3–11)
BUN: 33.5 mg/dL — ABNORMAL HIGH (ref 7.0–26.0)
CHLORIDE: 101 meq/L (ref 98–109)
CO2: 23 meq/L (ref 22–29)
Calcium: 9.3 mg/dL (ref 8.4–10.4)
Creatinine: 0.9 mg/dL (ref 0.6–1.1)
EGFR: 61 mL/min/{1.73_m2} — AB (ref >90)
GLUCOSE: 114 mg/dL (ref 70–140)
POTASSIUM: 3 meq/L — AB (ref 3.5–5.1)
SODIUM: 141 meq/L (ref 136–145)
Total Bilirubin: 0.44 mg/dL (ref 0.20–1.20)
Total Protein: 6.7 g/dL (ref 6.4–8.3)

## 2017-04-26 MED ORDER — ACETAMINOPHEN 325 MG PO TABS
ORAL_TABLET | ORAL | Status: AC
  Filled 2017-04-26: qty 2

## 2017-04-26 MED ORDER — POTASSIUM CHLORIDE 10 MEQ/100ML IV SOLN
10.0000 meq | Freq: Once | INTRAVENOUS | Status: DC
  Administered 2017-04-26: 10 meq via INTRAVENOUS
  Filled 2017-04-26: qty 100

## 2017-04-26 MED ORDER — DIPHENHYDRAMINE HCL 25 MG PO CAPS
50.0000 mg | ORAL_CAPSULE | Freq: Once | ORAL | Status: AC
  Administered 2017-04-26: 50 mg via ORAL

## 2017-04-26 MED ORDER — HEPARIN SOD (PORK) LOCK FLUSH 100 UNIT/ML IV SOLN
500.0000 [IU] | Freq: Once | INTRAVENOUS | Status: AC | PRN
  Administered 2017-04-26: 500 [IU]
  Filled 2017-04-26: qty 5

## 2017-04-26 MED ORDER — TRASTUZUMAB CHEMO 150 MG IV SOLR
750.0000 mg | Freq: Once | INTRAVENOUS | Status: AC
  Administered 2017-04-26: 750 mg via INTRAVENOUS
  Filled 2017-04-26: qty 35.72

## 2017-04-26 MED ORDER — SODIUM CHLORIDE 0.9% FLUSH
10.0000 mL | INTRAVENOUS | Status: DC | PRN
  Administered 2017-04-26: 10 mL
  Filled 2017-04-26: qty 10

## 2017-04-26 MED ORDER — ACETAMINOPHEN 325 MG PO TABS
650.0000 mg | ORAL_TABLET | Freq: Once | ORAL | Status: AC
  Administered 2017-04-26: 650 mg via ORAL

## 2017-04-26 MED ORDER — DIPHENHYDRAMINE HCL 25 MG PO CAPS
ORAL_CAPSULE | ORAL | Status: AC
  Filled 2017-04-26: qty 2

## 2017-04-26 MED ORDER — FULVESTRANT 250 MG/5ML IM SOLN
500.0000 mg | Freq: Once | INTRAMUSCULAR | Status: AC
Start: 2017-04-26 — End: 2017-04-26
  Administered 2017-04-26: 500 mg via INTRAMUSCULAR
  Filled 2017-04-26: qty 10

## 2017-04-26 MED ORDER — SODIUM CHLORIDE 0.9 % IV SOLN
Freq: Once | INTRAVENOUS | Status: AC
  Administered 2017-04-26: 13:00:00 via INTRAVENOUS

## 2017-04-26 NOTE — Telephone Encounter (Signed)
Scheduled appts per 9/17 los. Gave patient avs and calendar with appts.

## 2017-04-26 NOTE — Telephone Encounter (Signed)
Called pt/friend again still no answer LMOM pt is needing to make hosp f/u appt w/PCP. Closing encounter...Johny Chess

## 2017-04-26 NOTE — Progress Notes (Signed)
Patient Care Team: [name], [name], MD as PCP - General  DIAGNOSIS:  Encounter Diagnosis  Name Primary?  . Malignant neoplasm of upper-outer quadrant of left breast in female, estrogen receptor positive ([place] [facility]) Yes    SUMMARY OF ONCOLOGIC HISTORY:   Breast cancer of upper-outer quadrant of left female breast ([place])   12/07/2014 Initial Diagnosis    Left breast 2:00 position: Invasive ductal carcinoma grade 2, ER 5%, PR 0%, Ki-67 80%, HER-2 positive ratio 6.96      12/07/2014 Mammogram    Large left breast mass 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, 2 small benign nodules in the right breast stable since 2002, other consistent with normal intramammary lymph node left axilla ultrasound normal-sized lymph nodes      12/31/2014 - 04/22/2015 Neo-Adjuvant Chemotherapy    TCH Perjeta neoadjuvant chemotherapy 6 (decreased chemotherapy dosage with cycle 2 and discontinued Perjeta for diarrhea)       04/29/2015 Breast MRI    Significant interval decrease in size of the mass, continued skin retraction, 4.3 x 2 x 1.8 cm (previously 4.9 x 3.9 x 3.9 cm)      05/30/2015 Surgery    Left mastectomy: Path CR, 0/6 LN Negative      09/09/2015 -  Anti-estrogen oral therapy    Anastrozole 1 mg daily, Lapatinib added 09/09/15      07/09/2016 Imaging    Patient presented in the emergency department after a fall at home without help for 24 hours, CT head revealed 3.9 x 3.5 cm cystic mass anterior right temporal lobe with vasogenic edema, MRI revealed 4.9 cm mass      07/09/2016 - 07/16/2016 Hospital Admission    Admitted with brain metastases after a fall at home, rhabdomyolysis       07/13/2016 Surgery    Craniotomy by Dr.Ditty: Metastatic breast cancer ER 40%, PR 0%, HER-2 positive; postcraniotomy MRI showed residual enhancement along the anterior and superior margin, stable 1.3 cm left parietal lobe met.      08/11/2016 Imaging    Interval progression of Rt temporal mets 3.2 cm, Lt parietal intra axial mets 1 cm  size       08/13/2016 - 08/24/2016 Radiation Therapy    SRS Rt Temporal and Left Parietal lobes      11/12/2016 Relapse/Recurrence    Mixed interval changes in the right temporal lobe. Partial collapse of the resection cavity with improved enhancement posteriorly but increased enhancement anteriorly, decrease in the left parietal metastases 2 subcentimeter foci of nodular extra-axial enhancement right temporal and right parietal region      01/06/2017 Imaging    Progression: Right anterior temporal pole 14 mm (was 6 mm); right anterior temporal lobe 11 mm (was 5 mm) right posterior temporal due to movement 15 mm (was 6 mm) right parietal dural based 11 mm (was 6 mm) left parietal lobe stable 7 x 8 mm 18 x 29 mm thick and irregular enhancement right temporal lobe resection cavity      02/03/2017 - 04/26/2017 Chemotherapy    Lapatinib and anastrozole (stopped for Poor compliance)      04/26/2017 -  Chemotherapy    Herceptin Faslodex        CHIEF COMPLIANT: Follow-up on Herceptin and Faslodex  INTERVAL HISTORY: Savannah Howard is a 71 year old with metastatic breast cancer with brain metastases who was on lapatinib and anastrozole and it was stopped because of compliance issues. We were not sure whether she was taking her medications and how many  were she taking. This is because of multiple psychosocial issues with the patient's family. She was recently in the hospital and was discharged to [facility] where she is undergoing this therapy and rehabilitation. She is still weak and cannot stand or function by herself. Continues to have weakness of the left extremity. She had a brain MRI on 04/18/2017 which showed stable disease.  REVIEW OF SYSTEMS:   Constitutional: Denies fevers, chills or abnormal weight loss Eyes: Denies blurriness of vision Ears, nose, mouth, throat, and face: Denies mucositis or sore throat Respiratory: Denies cough, dyspnea or wheezes Cardiovascular: Denies palpitation,  chest discomfort Gastrointestinal:  Denies nausea, heartburn or change in bowel habits Skin: Denies abnormal skin rashes Lymphatics: Denies new lymphadenopathy or easy bruising Neurological:Denies numbness, tingling or new weaknesses Behavioral/Psych: Mood is stable, no new changes  Extremities: No lower extremity edema All other systems were reviewed with the patient and are negative.  I have reviewed the past medical history, past surgical history, social history and family history with the patient and they are unchanged from previous note.  ALLERGIES:  is allergic to penicillins and sulfonamide derivatives.  MEDICATIONS:  Current Outpatient Prescriptions  Medication Sig Dispense Refill  . acetaminophen (TYLENOL) 500 MG tablet Take 1,000 mg by mouth at bedtime as needed for mild pain or headache.     . Cholecalciferol (VITAMIN D3) 2000 units capsule Take 1 capsule (2,000 Units total) by mouth daily. 100 capsule 3  . cyanocobalamin (,VITAMIN B-12,) 1000 MCG/ML injection INJECT 1 ML SUBCUTANEOUSLY EVERY 30 DAYS 1 mL 3  . dexamethasone (DECADRON) 4 MG tablet Take 1 tablet (4 mg total) by mouth every 12 (twelve) hours. 60 tablet 0  . lacosamide 100 MG TABS Take 1 tablet (100 mg total) by mouth 2 (two) times daily. 60 tablet 1  . levETIRAcetam (KEPPRA) 500 MG tablet Take 1 tablet (500 mg total) by mouth 2 (two) times daily. 60 tablet 1  . LORazepam (ATIVAN) 0.5 MG tablet TAKE 1 TABLET BY MOUTH 3 TIMES A DAY AS NEEDED FOR ANXIETY 90 tablet 3  . magnesium gluconate (MAGONATE) 500 MG tablet Take 1 tablet (500 mg total) by mouth 2 (two) times daily. 60 tablet 11  . metoprolol succinate (TOPROL-XL) 50 MG 24 hr tablet TAKE 1/2 TABLET (25 MG TOTAL) BY MOUTH EVERY DAY 15 tablet 3  . omeprazole (PRILOSEC) 40 MG capsule Take 1 capsule (40 mg total) by mouth daily. 30 capsule 1  . ondansetron (ZOFRAN) 8 MG tablet Take 1 tablet (8 mg total) by mouth every 8 (eight) hours as needed for nausea or vomiting.  20 tablet 0  . senna (SENOKOT) 8.6 MG TABS tablet Take 1 tablet (8.6 mg total) by mouth 2 (two) times daily. 120 each 0   Current Facility-Administered Medications  Medication Dose Route Frequency Provider Last Rate Last Dose  . potassium chloride 10 mEq in 100 mL IVPB  10 mEq Intravenous Once Nicholas Lose, MD        PHYSICAL EXAMINATION: ECOG PERFORMANCE STATUS: 1 - Symptomatic but completely ambulatory  Vitals:   04/26/17 1101  BP: (!) 149/74  Pulse: (!) 53  Resp: 20  Temp: 98.9 F (37.2 C)  SpO2: 100%   Filed Weights   04/26/17 1101  Weight: 189 lb 14.4 oz (86.1 kg)    GENERAL:alert, no distress and comfortable SKIN: skin color, texture, turgor are normal, no rashes or significant lesions EYES: normal, Conjunctiva are pink and non-injected, sclera clear OROPHARYNX:no exudate, no erythema and lips,  buccal mucosa, and tongue normal  NECK: supple, thyroid normal size, non-tender, without nodularity LYMPH:  no palpable lymphadenopathy in the cervical, axillary or inguinal LUNGS: clear to auscultation and percussion with normal breathing effort HEART: regular rate & rhythm and no murmurs and no lower extremity edema ABDOMEN:abdomen soft, non-tender and normal bowel sounds MUSCULOSKELETAL:no cyanosis of digits and no clubbing  NEURO: Left sided weakness EXTREMITIES: No lower extremity edema  LABORATORY DATA:  I have reviewed the data as listed   Chemistry      Component Value Date/Time   NA 141 04/26/2017 1001   K 3.0 (LL) 04/26/2017 1001   CL 107 04/20/2017 0507   CO2 23 04/26/2017 1001   BUN 33.5 (H) 04/26/2017 1001   CREATININE 0.9 04/26/2017 1001   GLU 115 07/20/2016      Component Value Date/Time   CALCIUM 9.3 04/26/2017 1001   ALKPHOS 53 04/26/2017 1001   AST 11 04/26/2017 1001   ALT 19 04/26/2017 1001   BILITOT 0.44 04/26/2017 1001       Lab Results  Component Value Date   WBC 8.3 04/19/2017   HGB 10.1 (L) 04/19/2017   HCT 31.7 (L) 04/19/2017    MCV 102.9 (H) 04/19/2017   PLT 345 04/19/2017   NEUTROABS 6.7 04/17/2017    ASSESSMENT & PLAN:  Breast cancer of upper-outer quadrant of left female breast ([place]) 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 TCH-Perjeta 6 cycles started 12/31/2014 completed 04/22/2015 (decreased chemotherapy dosage with cycle 2 and discontinued Perjeta for diarrhea) 2. Left mastectomy 05/30/2015: CR to chemo, 0/6 LN  Did not need radiation based upon tumor board recommendation 3. Herceptin maintenance completed May 2017 4. Brain metastases diagnosed November 2017 5. Stereotactic radiosurgery by Dr. Tammi Klippel 08/13/16 to 08/24/16 6. Anastrozole started 09/08/2016  7. SRS to recurrent disease 01/21/2017 8. Lapatinib 1000 mg by mouth daily along with anastrozole 1 mg daily started 02/03/2017, stopped 03/10/2017 reduced dosage to 750 mg starting 03/19/2017 stopped 04/26/17  Current treatment:  Herceptin with Faslodex started 04/26/2017 general Denyse Amass wants to see her she'll be in the inferior 10 of potassium treatment IV We have serious concerns about compliance with her oral medication intake.  Patient will come back in 2 weeks for the loading dose of Faslodex. Return to clinic in one month for cycle 2.  I spent 25 minutes talking to the patient of which more than half was spent in counseling and coordination of care.  No orders of the defined types were placed in this encounter.  The patient has a good understanding of the overall plan. she agrees with it. she will call with any problems that may develop before the next visit here.   Rulon Eisenmenger, MD 04/26/17

## 2017-04-26 NOTE — Patient Instructions (Addendum)
[facility] Discharge Instructions for Patients Receiving Chemotherapy  Today you received the following chemotherapy agents Herceptin and Faslodex.  To help prevent nausea and vomiting after your treatment, we encourage you to take your nausea medication as directed.   If you develop nausea and vomiting that is not controlled by your nausea medication, call the clinic.   BELOW ARE SYMPTOMS THAT SHOULD BE REPORTED IMMEDIATELY:  *FEVER GREATER THAN 100.5 F  *CHILLS WITH OR WITHOUT FEVER  NAUSEA AND VOMITING THAT IS NOT CONTROLLED WITH YOUR NAUSEA MEDICATION  *UNUSUAL SHORTNESS OF BREATH  *UNUSUAL BRUISING OR BLEEDING  TENDERNESS IN MOUTH AND THROAT WITH OR WITHOUT PRESENCE OF ULCERS  *URINARY PROBLEMS  *BOWEL PROBLEMS  UNUSUAL RASH Items with * indicate a potential emergency and should be followed up as soon as possible.  Feel free to call the clinic you have any questions or concerns. The clinic phone number is [phone].  Please show the [facility] at check-in to the Emergency Department and triage nurse.   Potassium Content of Foods Potassium is a mineral found in many foods and drinks. It helps keep fluids and minerals balanced in your body and affects how steadily your heart beats. Potassium also helps control your blood pressure and keep your muscles and nervous system healthy. Certain health conditions and medicines may change the balance of potassium in your body. When this happens, you can help balance your level of potassium through the foods that you do or do not eat. Your health care provider or dietitian may recommend an amount of potassium that you should have each day. The following lists of foods provide the amount of potassium (in parentheses) per serving in each item. High in potassium The following foods and beverages have 200 mg or more of potassium per serving:  Apricots, 2 raw or 5 dry (200 mg).  Artichoke, 1 medium (345  mg).  Avocado, raw,  each (245 mg).  Banana, 1 medium (425 mg).  Beans, lima, or baked beans, canned,  cup (280 mg).  Beans, white, canned,  cup (595 mg).  Beef roast, 3 oz (320 mg).  Beef, ground, 3 oz (270 mg).  Beets, raw or cooked,  cup (260 mg).  Bran muffin, 2 oz (300 mg).  Broccoli,  cup (230 mg).  Brussels sprouts,  cup (250 mg).  Cantaloupe,  cup (215 mg).  Cereal, 100% bran,  cup (200-400 mg).  Cheeseburger, single, fast food, 1 each (225-400 mg).  Chicken, 3 oz (220 mg).  Clams, canned, 3 oz (535 mg).  Crab, 3 oz (225 mg).  Dates, 5 each (270 mg).  Dried beans and peas,  cup (300-475 mg).  Figs, dried, 2 each (260 mg).  Fish: halibut, tuna, cod, snapper, 3 oz (480 mg).  Fish: salmon, haddock, swordfish, perch, 3 oz (300 mg).  Fish, tuna, canned 3 oz (200 mg).  [place] fries, fast food, 3 oz (470 mg).  Granola with fruit and nuts,  cup (200 mg).  Grapefruit juice,  cup (200 mg).  Greens, beet,  cup (655 mg).  Honeydew melon,  cup (200 mg).  Kale, raw, 1 cup (300 mg).  Kiwi, 1 medium (240 mg).  Kohlrabi, rutabaga, parsnips,  cup (280 mg).  Lentils,  cup (365 mg).  Mango, 1 each (325 mg).  Milk, chocolate, 1 cup (420 mg).  Milk: nonfat, low-fat, whole, buttermilk, 1 cup (350-380 mg).  Molasses, 1 Tbsp (295 mg).  Mushrooms,  cup (280) mg.  Nectarine, 1  each (275 mg).  Nuts: almonds, peanuts, hazelnuts, [place], cashew, mixed, 1 oz (200 mg).  Nuts, pistachios, 1 oz (295 mg).  Orange, 1 each (240 mg).  Orange juice,  cup (235 mg).  Papaya, medium,  fruit (390 mg).  Peanut butter, chunky, 2 Tbsp (240 mg).  Peanut butter, smooth, 2 Tbsp (210 mg).  Pear, 1 medium (200 mg).  Pomegranate, 1 whole (400 mg).  Pomegranate juice,  cup (215 mg).  Pork, 3 oz (350 mg).  Potato chips, salted, 1 oz (465 mg).  Potato, baked with skin, 1 medium (925 mg).  Potatoes, boiled,  cup (255 mg).  Potatoes, mashed,   cup (330 mg).  Prune juice,  cup (370 mg).  Prunes, 5 each (305 mg).  Pudding, chocolate,  cup (230 mg).  Pumpkin, canned,  cup (250 mg).  Raisins, seedless,  cup (270 mg).  Seeds, sunflower or pumpkin, 1 oz (240 mg).  Soy milk, 1 cup (300 mg).  Spinach,  cup (420 mg).  Spinach, canned,  cup (370 mg).  Sweet potato, baked with skin, 1 medium (450 mg).  Swiss chard,  cup (480 mg).  Tomato or vegetable juice,  cup (275 mg).  Tomato sauce or puree,  cup (400-550 mg).  Tomato, raw, 1 medium (290 mg).  Tomatoes, canned,  cup (200-300 mg).  [place], 3 oz (250 mg).  Wheat germ, 1 oz (250 mg).  Winter squash,  cup (250 mg).  Yogurt, plain or fruited, 6 oz (260-435 mg).  Zucchini,  cup (220 mg).  Moderate in potassium The following foods and beverages have 50-200 mg of potassium per serving:  Apple, 1 each (150 mg).  Apple juice,  cup (150 mg).  Applesauce,  cup (90 mg).  Apricot nectar,  cup (140 mg).  Asparagus, small spears,  cup or 6 spears (155 mg).  Bagel, cinnamon raisin, 1 each (130 mg).  Bagel, egg or plain, 4 in., 1 each (70 mg).  Beans, green,  cup (90 mg).  Beans, yellow,  cup (190 mg).  Beer, regular, 12 oz (100 mg).  Beets, canned,  cup (125 mg).  Blackberries,  cup (115 mg).  Blueberries,  cup (60 mg).  Bread, whole wheat, 1 slice (70 mg).  Broccoli, raw,  cup (145 mg).  Cabbage,  cup (150 mg).  Carrots, cooked or raw,  cup (180 mg).  Cauliflower, raw,  cup (150 mg).  Celery, raw,  cup (155 mg).  Cereal, bran flakes, cup (120-150 mg).  Cheese, cottage,  cup (110 mg).  Cherries, 10 each (150 mg).  Chocolate, 1 oz bar (165 mg).  Coffee, brewed 6 oz (90 mg).  Corn,  cup or 1 ear (195 mg).  Cucumbers,  cup (80 mg).  Egg, large, 1 each (60 mg).  Eggplant,  cup (60 mg).  Endive, raw, cup (80 mg).  English muffin, 1 each (65 mg).  Fish, orange roughy, 3 oz (150  mg).  Frankfurter, beef or pork, 1 each (75 mg).  Fruit cocktail,  cup (115 mg).  Grape juice,  cup (170 mg).  Grapefruit,  fruit (175 mg).  Grapes,  cup (155 mg).  Greens: kale, turnip, collard,  cup (110-150 mg).  Ice cream or frozen yogurt, chocolate,  cup (175 mg).  Ice cream or frozen yogurt, vanilla,  cup (120-150 mg).  Lemons, limes, 1 each (80 mg).  Lettuce, all types, 1 cup (100 mg).  Mixed vegetables,  cup (150 mg).  Mushrooms, raw,  cup (110 mg).  Nuts: walnuts,  pecans, or macadamia, 1 oz (125 mg).  Oatmeal,  cup (80 mg).  Okra,  cup (110 mg).  Onions, raw,  cup (120 mg).  Peach, 1 each (185 mg).  Peaches, canned,  cup (120 mg).  Pears, canned,  cup (120 mg).  Peas, green, frozen,  cup (90 mg).  Peppers, green,  cup (130 mg).  Peppers, red,  cup (160 mg).  Pineapple juice,  cup (165 mg).  Pineapple, fresh or canned,  cup (100 mg).  Plums, 1 each (105 mg).  Pudding, vanilla,  cup (150 mg).  Raspberries,  cup (90 mg).  Rhubarb,  cup (115 mg).  Rice, wild,  cup (80 mg).  Shrimp, 3 oz (155 mg).  Spinach, raw, 1 cup (170 mg).  Strawberries,  cup (125 mg).  Summer squash  cup (175-200 mg).  Swiss chard, raw, 1 cup (135 mg).  Tangerines, 1 each (140 mg).  Tea, brewed, 6 oz (65 mg).  Turnips,  cup (140 mg).  Watermelon,  cup (85 mg).  Wine, red, table, 5 oz (180 mg).  Wine, white, table, 5 oz (100 mg).  Low in potassium The following foods and beverages have less than 50 mg of potassium per serving.  Bread, white, 1 slice (30 mg).  Carbonated beverages, 12 oz (less than 5 mg).  Cheese, 1 oz (20-30 mg).  Cranberries,  cup (45 mg).  Cranberry juice cocktail,  cup (20 mg).  Fats and oils, 1 Tbsp (less than 5 mg).  Hummus, 1 Tbsp (32 mg).  Nectar: papaya, mango, or pear,  cup (35 mg).  Rice, white or brown,  cup (50 mg).  Spaghetti or macaroni,  cup cooked (30 mg).  Tortilla, flour  or corn, 1 each (50 mg).  Waffle, 4 in., 1 each (50 mg).  Water chestnuts,  cup (40 mg).  This information is not intended to replace advice given to you by your health care provider. Make sure you discuss any questions you have with your health care provider. Document Released: 03/10/2005 Document Revised: 01/02/2016 Document Reviewed: 06/23/2013 Elsevier Interactive Patient Education  [employer].

## 2017-04-26 NOTE — Assessment & Plan Note (Signed)
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 TCH-Perjeta 6 cycles started 12/31/2014 completed 04/22/2015 (decreased chemotherapy dosage with cycle 2 and discontinued Perjeta for diarrhea) 2. Left mastectomy 05/30/2015: CR to chemo, 0/6 LN  Did not need radiation based upon tumor board recommendation 3. Herceptin maintenance completed May 2017 4. Brain metastases diagnosed November 2017 5. Stereotactic radiosurgery by Dr. Tammi Klippel 08/13/16 to 08/24/16 6. Anastrozole started 09/08/2016  7. SRS to recurrent disease 01/21/2017 8. Lapatinib 1000 mg by mouth daily along with anastrozole 1 mg daily started 02/03/2017, stopped 03/10/2017 reduced dosage to 750 mg starting 03/19/2017 stopped 04/26/17  Current treatment:  Herceptin with Faslodex started 04/26/2017 general Denyse Amass wants to see her she'll be in the inferior 10 of potassium treatment IV We have serious concerns about compliance with her oral medication intake.

## 2017-04-27 LAB — BASIC METABOLIC PANEL
BUN: 34 — AB (ref 4–21)
CREATININE: 0.9 (ref 0.5–1.1)
Glucose: 129
Potassium: 3.4 (ref 3.4–5.3)
Sodium: 140 (ref 137–147)

## 2017-04-27 LAB — HEPATIC FUNCTION PANEL
ALK PHOS: 58 (ref 25–125)
ALT: 24 (ref 7–35)
AST: 17 (ref 13–35)
BILIRUBIN, TOTAL: 0.3

## 2017-04-27 LAB — CBC AND DIFFERENTIAL
HCT: 34 — AB (ref 36–46)
Hemoglobin: 11.4 — AB (ref 12.0–16.0)
Platelets: 264 (ref 150–399)
WBC: 10

## 2017-04-30 ENCOUNTER — Encounter: Payer: Self-pay | Admitting: Hematology and Oncology

## 2017-04-30 NOTE — Progress Notes (Signed)
Received PA request from [employer] for Lidocaine-Prilocaine cream.  Submitted via [employer].  Larina Bras (Key: [id number]) [phone]  Need help? Call us at [phone]   Status  Sent to [employer]  Next Steps  The plan will fax you a determination, typically within 1 to 5 business days.

## 2017-05-03 ENCOUNTER — Encounter: Payer: Self-pay | Admitting: Hematology and Oncology

## 2017-05-03 NOTE — Progress Notes (Signed)
Followed up on PA determination for lidocaine-prilocaine cream after receiving several forms to complete over the weekend when office was closed.  Called [employer](Jennifer) who advised PA was denied today. She connected me to the appeals department. I explained this would be for numbness of the port site. Pharmacist(Mallory) called to confirm days supply. She stated it would be approved and I would receive confirmation.  Appealed PA determination of denial received first.  Appeal PA approval received via fax from [facility] through 08/02/17.  Medication appeared to have been discontinued by provider but PA approval is on file if needed.

## 2017-05-05 ENCOUNTER — Other Ambulatory Visit: Payer: Self-pay | Admitting: *Deleted

## 2017-05-05 NOTE — Patient Outreach (Signed)
[facility] ([facility]) Care Management  05/04/17  Savannah Howard 05/04/1946 [id number]  Met with patient at facility 05/04/17.  Patient reports she lives alone in an efficiency. She has support from a friend who is POA and another paid caregiver. They provide transportation and assist with medications. Paid caregiver assists with ADLs and IADLs.  Patient still remains under Cancer center care for ongoing cancer treatment. Patient states she realizes she has to give up some things due to her condition such as driving and also getting help at home.   RNCM reviewed [facility] care management services, went in depth on care coordination with patient, patient voices understanding and accepted information. She feels that she has a good plan in place and will keep [facility] information for future reference.   RNCM let Kristy, SW at facility know that patient is on [facility] list but did not sign up for services.   Plan to sign off. Mary E. Niemczura, RN, BSN, CCM  Post Acute Care Coordinator [facility] ([phone]) Business Cell  [phone]) Toll Free Office   

## 2017-05-11 ENCOUNTER — Ambulatory Visit (HOSPITAL_BASED_OUTPATIENT_CLINIC_OR_DEPARTMENT_OTHER): Payer: Medicare Other

## 2017-05-11 ENCOUNTER — Encounter: Payer: Self-pay | Admitting: Internal Medicine

## 2017-05-11 ENCOUNTER — Non-Acute Institutional Stay (SKILLED_NURSING_FACILITY): Payer: Medicare Other | Admitting: Internal Medicine

## 2017-05-11 DIAGNOSIS — E860 Dehydration: Secondary | ICD-10-CM

## 2017-05-11 DIAGNOSIS — C7931 Secondary malignant neoplasm of brain: Secondary | ICD-10-CM

## 2017-05-11 DIAGNOSIS — E538 Deficiency of other specified B group vitamins: Secondary | ICD-10-CM

## 2017-05-11 DIAGNOSIS — F419 Anxiety disorder, unspecified: Secondary | ICD-10-CM | POA: Diagnosis not present

## 2017-05-11 DIAGNOSIS — Z5111 Encounter for antineoplastic chemotherapy: Secondary | ICD-10-CM

## 2017-05-11 DIAGNOSIS — I10 Essential (primary) hypertension: Secondary | ICD-10-CM | POA: Diagnosis not present

## 2017-05-11 DIAGNOSIS — B9620 Unspecified Escherichia coli [E. coli] as the cause of diseases classified elsewhere: Secondary | ICD-10-CM

## 2017-05-11 DIAGNOSIS — E559 Vitamin D deficiency, unspecified: Secondary | ICD-10-CM

## 2017-05-11 DIAGNOSIS — K219 Gastro-esophageal reflux disease without esophagitis: Secondary | ICD-10-CM

## 2017-05-11 DIAGNOSIS — K521 Toxic gastroenteritis and colitis: Secondary | ICD-10-CM

## 2017-05-11 DIAGNOSIS — C50412 Malignant neoplasm of upper-outer quadrant of left female breast: Secondary | ICD-10-CM | POA: Diagnosis not present

## 2017-05-11 DIAGNOSIS — G40109 Localization-related (focal) (partial) symptomatic epilepsy and epileptic syndromes with simple partial seizures, not intractable, without status epilepticus: Secondary | ICD-10-CM | POA: Diagnosis not present

## 2017-05-11 DIAGNOSIS — E876 Hypokalemia: Secondary | ICD-10-CM

## 2017-05-11 DIAGNOSIS — T451X5A Adverse effect of antineoplastic and immunosuppressive drugs, initial encounter: Secondary | ICD-10-CM

## 2017-05-11 DIAGNOSIS — C50911 Malignant neoplasm of unspecified site of right female breast: Secondary | ICD-10-CM | POA: Diagnosis not present

## 2017-05-11 DIAGNOSIS — N390 Urinary tract infection, site not specified: Secondary | ICD-10-CM

## 2017-05-11 MED ORDER — FULVESTRANT 250 MG/5ML IM SOLN
500.0000 mg | Freq: Once | INTRAMUSCULAR | Status: AC
  Administered 2017-05-11: 500 mg via INTRAMUSCULAR
  Filled 2017-05-11: qty 10

## 2017-05-11 NOTE — Progress Notes (Signed)
Location:  [place] Room Number: 110 Place of Service:  SNF [phone])  Provider: Noah Delaine. Sheppard Coil, MD  PCP: [name], MD Patient Care Team: [name], MD as PCP - General  Extended Emergency Contact Information Primary Emergency Contact: Moore,Jeannie Address: [place], [place] Mobile Phone: [phone] Relation: Friend  Allergies  Allergen Reactions  . Penicillins Swelling and Other (See Comments)    Genital swelling/skin peeled/yeast infection Has patient had a PCN reaction causing immediate rash, facial/tongue/throat swelling, SOB or lightheadedness with hypotension: Yes Has patient had a PCN reaction causing severe rash involving mucus membranes or skin necrosis: Unknown Has patient had a PCN reaction that required hospitalization: Unknown Has patient had a PCN reaction occurring within the last 10 years: Unknown If all of the above answers are "NO", then may proceed with   . Sulfonamide Derivatives Other (See Comments)    Convulsions      Chief Complaint  Patient presents with  . Discharge Note    discharge from SNF to home    HPI:  71 y.o. female  with metastatic breast cancer with brain metastases, medical noncompliance, history of seizure disorder, hypertension, degenerative disc disease, GERD, who was admitted was admitted to [facility] from 9/8-11 for a partial seizure presenting his hemiparesis. Patient was treated IV Drug and IV Ativan then switched over to by mouth Keppra and Vimpat. Hospital course was, K by dehydration and hypokalemia which improved with hydration and by an Escherichia coli UTI which was treated with fosfomycin 3 g 1 dose. Patient was admitted to skilled nursing facility for generalized weakness for OT/PT and is now ready to be discharged to home.    Past Medical History:  Diagnosis Date  . Acute on chronic kidney failure-II 07/10/2016    . Allergy    rhinitis  . Anemia   . Antineoplastic chemotherapy induced anemia 04/02/2015  . Anxiety   . B12 deficiency 10/22/2008   Chronic    . Brain cancer ([place]) 06/2016   breast ca with mets to brain  . Brain tumor ([place])   . Breast cancer metastasized to brain [facility]) 07/18/2016   Dr Tammi Klippel - XRT Dr Lindi Adie Dr Ditty S/p craniotomy 12/17  . Breast cancer of upper-outer quadrant of left female breast ([place]) 12/20/2014  . Chemotherapy induced diarrhea 01/08/2015   x5 weeks   Questran Lomotil Consider a probiotic C diff stool test   . DDD (degenerative disc disease), lumbar dx'd in 1980's  . GERD 05/09/2007   Chronic     . GERD (gastroesophageal reflux disease)   . Heart murmur   . History of blood transfusion 2016 X 3 (05/30/2015)  . Hypertension   . Hypomagnesemia 01/07/2017  . Kidney stone 1990's X 1   "passed it"  . Knee pain, left 07/18/2016  . Malignant brain tumor ([place]) 07/18/2016  . Mucositis due to chemotherapy 01/08/2015  . Obesity   . Primary cancer of upper outer quadrant of left breast ([place]) dx'd 03/2015  . Primary malignant neoplasm of left breast with metastasis to other site [facility])   . Rhabdomyolysis 07/10/2016  . Seizure ([place])   . Seizure disorder ([place]) 01/07/2017   Due to brain mets On Keppra  . Syncope 07/10/2016  . Vitamin B 12 deficiency   . Vitamin D deficiency   . Vitamin D deficiency 04/24/2009   Chronic  Past Surgical History:  Procedure Laterality Date  . APPLICATION OF CRANIAL NAVIGATION N/A 07/13/2016   Procedure: APPLICATION OF CRANIAL NAVIGATION;  Surgeon: Kevan Ny Ditty, MD;  Location: [place];  Service: Neurosurgery;  Laterality: N/A;  . BREAST BIOPSY Left ~ 12/2014  . CHOLECYSTECTOMY OPEN  ~ 1975  . ESOPHAGEAL DILATION  X 3  . MASTECTOMY COMPLETE / SIMPLE W/ SENTINEL NODE BIOPSY Left 05/30/2015  . MASTECTOMY W/ SENTINEL NODE BIOPSY Left 05/30/2015   Procedure: LEFT MASTECTOMY WITH SENTINEL LYMPH NODE BIOPSY;  Surgeon: Autumn Messing III, MD;   Location: [place];  Service: General;  Laterality: Left;  . PORTACATH PLACEMENT Right 12/26/2014   Procedure: INSERTION PORT-A-CATH;  Surgeon: Autumn Messing III, MD;  Location: [place];  Service: General;  Laterality: Right;  . PR DURAL GRAFT REPAIR,SPINE DEFECT N/A 07/13/2016   Procedure: Marshia Ly STERIOTACTIC BIOPSY WITH STEALTH;  Surgeon: Kevan Ny Ditty, MD;  Location: [place];  Service: Neurosurgery;  Laterality: N/A;     reports that she has never smoked. She has never used smokeless tobacco. She reports that she drinks alcohol. She reports that she does not use drugs. Social History   Social History  . Marital status: Single    Spouse name: N/A  . Number of children: N/A  . Years of education: N/A   Occupational History  . retired Energy manager   Social History Main Topics  . Smoking status: Never Smoker  . Smokeless tobacco: Never Used  . Alcohol use Yes     Comment: 05/30/2015 "I might take a drink of wine 1-2 times/yr"  . Drug use: No  . Sexual activity: Not Currently   Other Topics Concern  . Not on file   Social History Narrative   Admitted to [facility] 04/22/17   Never married   Never smoked   Alcohol  Occasional wine   Full Code    Pertinent  Health Maintenance Due  Topic Date Due  . COLONOSCOPY  02/04/1995  . INFLUENZA VACCINE  03/10/2017  . PNA vac Low Risk Adult (2 of 2 - PCV13) 01/22/2018  . MAMMOGRAM  05/01/2018  . DEXA SCAN  Completed    Medications: Allergies as of 05/11/2017      Reactions   Penicillins Swelling, Other (See Comments)   Genital swelling/skin peeled/yeast infection Has patient had a PCN reaction causing immediate rash, facial/tongue/throat swelling, SOB or lightheadedness with hypotension: Yes Has patient had a PCN reaction causing severe rash involving mucus membranes or skin necrosis: Unknown Has patient had a PCN reaction that required hospitalization: Unknown Has patient had a PCN  reaction occurring within the last 10 years: Unknown If all of the above answers are "NO", then may proceed with    Sulfonamide Derivatives Other (See Comments)   Convulsions      Medication List       Accurate as of 05/11/17 11:59 PM. Always use your most recent med list.          acetaminophen 500 MG tablet Commonly known as:  TYLENOL Take 1,000 mg by mouth at bedtime as needed for mild pain or headache.   cyanocobalamin 1000 MCG/ML injection Commonly known as:  (VITAMIN B-12) INJECT 1 ML SUBCUTANEOUSLY EVERY 30 DAYS   dexamethasone 4 MG tablet Commonly known as:  DECADRON Take 1 tablet (4 mg total) by mouth every 12 (twelve) hours.   Lacosamide 100 MG Tabs Take 1 tablet (100 mg total) by mouth 2 (  two) times daily.   levETIRAcetam 500 MG tablet Commonly known as:  KEPPRA Take 1 tablet (500 mg total) by mouth 2 (two) times daily.   LORazepam 0.5 MG tablet Commonly known as:  ATIVAN TAKE 1 TABLET BY MOUTH 3 TIMES A DAY AS NEEDED FOR ANXIETY   magnesium gluconate 500 MG tablet Commonly known as:  MAGONATE Take 1 tablet (500 mg total) by mouth 2 (two) times daily.   metoprolol succinate 50 MG 24 hr tablet Commonly known as:  TOPROL-XL TAKE 1/2 TABLET (25 MG TOTAL) BY MOUTH EVERY DAY   omeprazole 40 MG capsule Commonly known as:  PRILOSEC Take 1 capsule (40 mg total) by mouth daily.   ondansetron 8 MG tablet Commonly known as:  ZOFRAN Take 1 tablet (8 mg total) by mouth every 8 (eight) hours as needed for nausea or vomiting.   senna 8.6 MG Tabs tablet Commonly known as:  SENOKOT Take 1 tablet (8.6 mg total) by mouth 2 (two) times daily.   Vitamin D3 2000 units capsule Take 1 capsule (2,000 Units total) by mouth daily.        Vitals:   05/11/17 1403  BP: (!) 147/62  Pulse: 62  Resp: 18  Temp: (!) 97.3 F (36.3 C)  Weight: 196 lb (88.9 kg)  Height: 5\' 2"  (1.575 m)   Body mass index is 35.85 kg/m.  Physical Exam  GENERAL APPEARANCE: Alert,  conversant. No acute distress.  HEENT: Unremarkable. RESPIRATORY: Breathing is even, unlabored. Lung sounds are clear   CARDIOVASCULAR: Heart RRR no murmurs, rubs or gallops. No peripheral edema.  GASTROINTESTINAL: Abdomen is soft, non-tender, not distended w/ normal bowel sounds.  NEUROLOGIC: Cranial nerves 2-12 grossly intact. Moves all extremities   Labs reviewed: Basic Metabolic Panel:  Recent Labs  04/18/17 0536 04/18/17 0845 04/19/17 0340 04/20/17 0507 04/26/17 1001 04/27/17  NA 137  --  137 137 141 140  K 4.2  --  3.9 3.9 3.0* 3.4  CL 104  --  106 107  --   --   CO2 24  --  24 22 23   --   GLUCOSE 142*  --  175* 143* 114  --   BUN 13  --  15 19 33.5* 34*  CREATININE 0.95  --  1.09* 1.15* 0.9 0.9  CALCIUM 8.0*  --  8.2* 7.8* 9.3  --   MG  --  1.0* 2.0 1.5*  --   --    No results found for: [facility] Liver Function Tests:  Recent Labs  04/17/17 1412 04/18/17 0536 04/26/17 1001 04/27/17  AST 21 11* 11 17  ALT 10* 9* 19 24  ALKPHOS 49 45 53 58  BILITOT 1.0 0.7 0.44  --   PROT 5.5* 5.3* 6.7  --   ALBUMIN 2.5* 2.3* 3.2*  --    No results for input(s): LIPASE, AMYLASE in the last 8760 hours. No results for input(s): AMMONIA in the last 8760 hours. CBC:  Recent Labs  04/16/17 0951  04/17/17 2038 04/18/17 0536 04/19/17 0340 04/26/17 1001 04/27/17  WBC 10.2  --  8.7 4.7 8.3 12.5* 10.0  NEUTROABS 7.0*  --  6.7  --   --  8.3*  --   HGB 12.3  < > 11.8* 10.5* 10.1* 12.4 11.4*  HCT 35.8  < > 35.3* 31.6* 31.7* 36.0 34*  MCV 100.8  --  102.3* 102.3* 102.9* 100.3  --   PLT 365  --  308 308 345 317 264  < > =  values in this interval not displayed. Lipid No results for input(s): CHOL, HDL, LDLCALC, TRIG in the last 8760 hours. Cardiac Enzymes:  Recent Labs  07/10/16 0420 07/11/16 0625 07/13/16 0528  CKTOTAL 9,810* 3,599* 2,244*  TROPONINI 0.78*  --   --    BNP:  Recent Labs  07/10/16 0420  BNP 222.2*   CBG:  Recent Labs  07/16/16 0645  01/06/17 1553 04/17/17 1402  GLUCAP 107* 134* 134*    Procedures and Imaging Studies During Stay: Mr Jeri Cos Wo Contrast  Addendum Date: 04/26/2017   ADDENDUM REPORT: 04/26/2017 08:11 ADDENDUM: Case discussed in tumor board this morning. Patient has treated right temporal lobe metastasis with regional dural thickening and nodularity. Two dural nodules (9x 8 mm superior to the right transverse sigmoid sinus junction and 11 x 37mm on the floor of the right middle cranial fossa) have increased in size compared 03/21/2017, but are stable to improved compared to 01/06/2017. Electronically Signed   By: Monte Fantasia M.D.   On: 04/26/2017 08:11   Result Date: 04/26/2017 CLINICAL DATA:  New onset LEFT-sided weakness and LEFT facial twitch. Follow-up brain metastasis. History of metastatic breast cancer with brain metastasis, seizure disorder, hypertension. EXAM: MRI HEAD WITHOUT AND WITH CONTRAST TECHNIQUE: Multiplanar, multiecho pulse sequences of the brain and surrounding structures were obtained without and with intravenous contrast. CONTRAST:  8mL MULTIHANCE GADOBENATE DIMEGLUMINE 529 MG/ML IV SOLN COMPARISON:  CT HEAD April 17, 2017 and MRI of the head March 21, 2017 and Jan 06, 2017 FINDINGS: INTRACRANIAL CONTENTS: No reduced diffusion to suggest acute ischemia or hypercellular tumor. Stable appearance of for cortical and dural based metastasis, preponderance and RIGHT temporal lobe and dura, largest measuring 14 x 2.6 cm. Irregular RIGHT frontotemporal dural thickening subjacent to craniotomy. Stable cyst septa bright only be artifact RIGHT temporal resection cavity. Subcentimeter LEFT parietal metastasis is stable. RIGHT frontal developmental venous anomaly. Stable vasogenic edema and gliosis RIGHT temporal lobe. Ventricles and sulci are overall normal for patient's age. Patchy supratentorial white matter FLAIR T2 hyperintensities compatible with mild to moderate chronic small vessel ischemic  disease. No abnormal extra-axial fluid collections. Extensive fall seen dural calcification. VASCULAR: Normal major intracranial vascular flow voids present at skull base. SKULL AND UPPER CERVICAL SPINE: No abnormal sellar expansion. RIGHT craniotomy. Craniocervical junction maintained. SINUSES/ORBITS: Trace RIGHT mastoid effusion. Paranasal sinuses are well-aerated. The included ocular globes and orbital contents are non-suspicious. OTHER: None. IMPRESSION: 1. No acute intracranial process. 2. Stable number, size and appearance of multiple predominately RIGHT temporal parenchymal and dural-based metastasis in a background of treatment related changes. Electronically Signed: By: Elon Alas M.D. On: 04/18/2017 05:40   Ct Head Code Stroke Wo Contrast  Result Date: 04/17/2017 CLINICAL DATA:  Code stroke. Altered level of consciousness. Right sided weakness and slurred speech. History of breast cancer metastatic to brain EXAM: CT HEAD WITHOUT CONTRAST TECHNIQUE: Contiguous axial images were obtained from the base of the skull through the vertex without intravenous contrast. COMPARISON:  MRI head 03/21/2017.  CT 01/06/2017 FINDINGS: Brain: Postop right temporal craniotomy for tumor resection. Hypodensity in the right temporal lobe similar to prior studies. No significant mass-effect on the temporal horn. No shift of the midline structures. Small metastatic deposit left parietal lobe noted on MRI and not seen on CT Negative for hemorrhage.  No acute infarct.  Ventricle size normal Vascular: Negative for hyperdense vessel Skull: Right temporal craniotomy.  No skull lesion identified Sinuses/Orbits: Negative Other: Negative ASPECTS ([place] Stroke Program Early CT  Score) - Ganglionic level infarction (caudate, lentiform nuclei, internal capsule, insula, M1-M3 cortex): 7 - Supraganglionic infarction (M4-M6 cortex): 3 Total score (0-10 with 10 being normal): 10 IMPRESSION: 1. No acute abnormality. Tumor resection  and treatment in the right temporal lobe appear stable. 2. ASPECTS is 10 These results were called by telephone at the time of interpretation on 04/17/2017 at 2:34 pm to Dr. Rory Percy, who verbally acknowledged these results. Electronically Signed   By: Franchot Gallo M.D.   On: 04/17/2017 14:35    Assessment/Plan:   Simple partial seizures (HCC)  Malignant neoplasm of right breast metastatic to brain (HCC)  E. coli UTI  Dehydration  Gastroesophageal reflux disease without esophagitis  Essential hypertension  Anxiety  B12 deficiency  Hypokalemia  Vitamin D deficiency   Patient is being discharged with the following home health services:  OT/PT/nursing  Patient is being discharged with the following durable medical equipment:  None  Patient has been advised to f/u with their PCP in 1-2 weeks to bring them up to date on their rehab stay.  Social services at facility was responsible for arranging this appointment.  Pt was provided with a 30 day supply of prescriptions for medications and refills must be obtained from their PCP.  For controlled substances, a more limited supply may be provided adequate until PCP appointment only  Medications have been reconciled.  Time spent greater than 30 minutes;> 50% of time with patient was spent reviewing records, labs, tests and studies, counseling and developing plan of care  Noah Delaine. Sheppard Coil, MD

## 2017-05-12 ENCOUNTER — Encounter: Payer: Self-pay | Admitting: Internal Medicine

## 2017-05-20 ENCOUNTER — Other Ambulatory Visit: Payer: Self-pay

## 2017-05-20 ENCOUNTER — Other Ambulatory Visit (HOSPITAL_COMMUNITY): Payer: Self-pay

## 2017-05-20 ENCOUNTER — Encounter (HOSPITAL_COMMUNITY): Payer: Self-pay

## 2017-05-20 ENCOUNTER — Emergency Department (HOSPITAL_COMMUNITY): Payer: Medicare Other

## 2017-05-20 ENCOUNTER — Inpatient Hospital Stay (HOSPITAL_COMMUNITY)
Admission: EM | Admit: 2017-05-20 | Discharge: 2017-05-26 | DRG: 281 | Disposition: A | Discharge status: Home / Self Care | Payer: Medicare Other | Attending: Internal Medicine | Admitting: Internal Medicine

## 2017-05-20 DIAGNOSIS — N189 Chronic kidney disease, unspecified: Secondary | ICD-10-CM | POA: Diagnosis present

## 2017-05-20 DIAGNOSIS — B9620 Unspecified Escherichia coli [E. coli] as the cause of diseases classified elsewhere: Secondary | ICD-10-CM | POA: Diagnosis present

## 2017-05-20 DIAGNOSIS — Z79899 Other long term (current) drug therapy: Secondary | ICD-10-CM

## 2017-05-20 DIAGNOSIS — C50919 Malignant neoplasm of unspecified site of unspecified female breast: Secondary | ICD-10-CM | POA: Diagnosis present

## 2017-05-20 DIAGNOSIS — C7931 Secondary malignant neoplasm of brain: Secondary | ICD-10-CM | POA: Diagnosis not present

## 2017-05-20 DIAGNOSIS — S199XXA Unspecified injury of neck, initial encounter: Secondary | ICD-10-CM | POA: Diagnosis not present

## 2017-05-20 DIAGNOSIS — I5020 Unspecified systolic (congestive) heart failure: Secondary | ICD-10-CM | POA: Diagnosis present

## 2017-05-20 DIAGNOSIS — I130 Hypertensive heart and chronic kidney disease with heart failure and stage 1 through stage 4 chronic kidney disease, or unspecified chronic kidney disease: Secondary | ICD-10-CM | POA: Diagnosis not present

## 2017-05-20 DIAGNOSIS — R748 Abnormal levels of other serum enzymes: Secondary | ICD-10-CM

## 2017-05-20 DIAGNOSIS — Z882 Allergy status to sulfonamides status: Secondary | ICD-10-CM

## 2017-05-20 DIAGNOSIS — R7989 Other specified abnormal findings of blood chemistry: Secondary | ICD-10-CM | POA: Diagnosis not present

## 2017-05-20 DIAGNOSIS — K219 Gastro-esophageal reflux disease without esophagitis: Secondary | ICD-10-CM | POA: Diagnosis present

## 2017-05-20 DIAGNOSIS — Y9259 Other trade areas as the place of occurrence of the external cause: Secondary | ICD-10-CM

## 2017-05-20 DIAGNOSIS — C50911 Malignant neoplasm of unspecified site of right female breast: Secondary | ICD-10-CM | POA: Diagnosis not present

## 2017-05-20 DIAGNOSIS — Z66 Do not resuscitate: Secondary | ICD-10-CM | POA: Diagnosis not present

## 2017-05-20 DIAGNOSIS — Z853 Personal history of malignant neoplasm of breast: Secondary | ICD-10-CM

## 2017-05-20 DIAGNOSIS — S299XXA Unspecified injury of thorax, initial encounter: Secondary | ICD-10-CM | POA: Diagnosis not present

## 2017-05-20 DIAGNOSIS — R110 Nausea: Secondary | ICD-10-CM | POA: Diagnosis present

## 2017-05-20 DIAGNOSIS — N390 Urinary tract infection, site not specified: Secondary | ICD-10-CM | POA: Diagnosis not present

## 2017-05-20 DIAGNOSIS — Z9114 Patient's other noncompliance with medication regimen: Secondary | ICD-10-CM

## 2017-05-20 DIAGNOSIS — R4182 Altered mental status, unspecified: Secondary | ICD-10-CM | POA: Diagnosis present

## 2017-05-20 DIAGNOSIS — S8011XA Contusion of right lower leg, initial encounter: Secondary | ICD-10-CM | POA: Diagnosis present

## 2017-05-20 DIAGNOSIS — I214 Non-ST elevation (NSTEMI) myocardial infarction: Principal | ICD-10-CM | POA: Diagnosis present

## 2017-05-20 DIAGNOSIS — R55 Syncope and collapse: Secondary | ICD-10-CM

## 2017-05-20 DIAGNOSIS — R410 Disorientation, unspecified: Secondary | ICD-10-CM | POA: Diagnosis present

## 2017-05-20 DIAGNOSIS — Z6836 Body mass index (BMI) 36.0-36.9, adult: Secondary | ICD-10-CM

## 2017-05-20 DIAGNOSIS — I255 Ischemic cardiomyopathy: Secondary | ICD-10-CM | POA: Diagnosis present

## 2017-05-20 DIAGNOSIS — Z9012 Acquired absence of left breast and nipple: Secondary | ICD-10-CM

## 2017-05-20 DIAGNOSIS — E669 Obesity, unspecified: Secondary | ICD-10-CM | POA: Diagnosis present

## 2017-05-20 DIAGNOSIS — N179 Acute kidney failure, unspecified: Secondary | ICD-10-CM | POA: Diagnosis not present

## 2017-05-20 DIAGNOSIS — Z923 Personal history of irradiation: Secondary | ICD-10-CM

## 2017-05-20 DIAGNOSIS — Z9221 Personal history of antineoplastic chemotherapy: Secondary | ICD-10-CM

## 2017-05-20 DIAGNOSIS — S0003XA Contusion of scalp, initial encounter: Secondary | ICD-10-CM | POA: Diagnosis not present

## 2017-05-20 DIAGNOSIS — W010XXA Fall on same level from slipping, tripping and stumbling without subsequent striking against object, initial encounter: Secondary | ICD-10-CM | POA: Diagnosis present

## 2017-05-20 DIAGNOSIS — Y9389 Activity, other specified: Secondary | ICD-10-CM

## 2017-05-20 DIAGNOSIS — W19XXXA Unspecified fall, initial encounter: Secondary | ICD-10-CM

## 2017-05-20 DIAGNOSIS — Z880 Allergy status to penicillin: Secondary | ICD-10-CM

## 2017-05-20 DIAGNOSIS — G40909 Epilepsy, unspecified, not intractable, without status epilepticus: Secondary | ICD-10-CM | POA: Diagnosis present

## 2017-05-20 DIAGNOSIS — S064X0A Epidural hemorrhage without loss of consciousness, initial encounter: Secondary | ICD-10-CM | POA: Diagnosis not present

## 2017-05-20 DIAGNOSIS — S098XXA Other specified injuries of head, initial encounter: Secondary | ICD-10-CM | POA: Diagnosis not present

## 2017-05-20 DIAGNOSIS — R778 Other specified abnormalities of plasma proteins: Secondary | ICD-10-CM

## 2017-05-20 DIAGNOSIS — S3993XA Unspecified injury of pelvis, initial encounter: Secondary | ICD-10-CM | POA: Diagnosis not present

## 2017-05-20 LAB — CBC WITH DIFFERENTIAL/PLATELET
BASOS ABS: 0 10*3/uL (ref 0.0–0.1)
Basophils Relative: 0 %
Eosinophils Absolute: 0 10*3/uL (ref 0.0–0.7)
Eosinophils Relative: 0 %
HEMATOCRIT: 40.3 % (ref 36.0–46.0)
HEMOGLOBIN: 13.7 g/dL (ref 12.0–15.0)
LYMPHS ABS: 1.8 10*3/uL (ref 0.7–4.0)
LYMPHS PCT: 13 %
MCH: 34.1 pg — AB (ref 26.0–34.0)
MCHC: 34 g/dL (ref 30.0–36.0)
MCV: 100.2 fL — AB (ref 78.0–100.0)
Monocytes Absolute: 1.3 10*3/uL — ABNORMAL HIGH (ref 0.1–1.0)
Monocytes Relative: 10 %
NEUTROS ABS: 10.7 10*3/uL — AB (ref 1.7–7.7)
NEUTROS PCT: 77 %
PLATELETS: 224 10*3/uL (ref 150–400)
RBC: 4.02 MIL/uL (ref 3.87–5.11)
RDW: 13.1 % (ref 11.5–15.5)
WBC: 13.8 10*3/uL — AB (ref 4.0–10.5)

## 2017-05-20 LAB — I-STAT TROPONIN, ED
TROPONIN I, POC: 1.75 ng/mL — AB (ref 0.00–0.08)
TROPONIN I, POC: 1.75 ng/mL — AB (ref 0.00–0.08)

## 2017-05-20 LAB — COMPREHENSIVE METABOLIC PANEL
ALK PHOS: 66 U/L (ref 38–126)
ALT: 23 U/L (ref 14–54)
AST: 26 U/L (ref 15–41)
Albumin: 3.1 g/dL — ABNORMAL LOW (ref 3.5–5.0)
Anion gap: 12 (ref 5–15)
BILIRUBIN TOTAL: 1 mg/dL (ref 0.3–1.2)
BUN: 25 mg/dL — AB (ref 6–20)
CALCIUM: 8.7 mg/dL — AB (ref 8.9–10.3)
CHLORIDE: 99 mmol/L — AB (ref 101–111)
CO2: 26 mmol/L (ref 22–32)
CREATININE: 1.13 mg/dL — AB (ref 0.44–1.00)
GFR calc Af Amer: 55 mL/min — ABNORMAL LOW (ref >60)
GFR, EST NON AFRICAN AMERICAN: 47 mL/min — AB (ref >60)
Glucose, Bld: 127 mg/dL — ABNORMAL HIGH (ref 65–99)
Potassium: 3.5 mmol/L (ref 3.5–5.1)
Sodium: 137 mmol/L (ref 135–145)
Total Protein: 5.8 g/dL — ABNORMAL LOW (ref 6.5–8.1)

## 2017-05-20 LAB — CK: Total CK: 81 U/L (ref 38–234)

## 2017-05-20 MED ORDER — SODIUM CHLORIDE 0.9 % IV BOLUS (SEPSIS)
1000.0000 mL | Freq: Once | INTRAVENOUS | Status: AC
  Administered 2017-05-20: 1000 mL via INTRAVENOUS

## 2017-05-20 MED ORDER — ASPIRIN 325 MG PO TABS: 325.0000 mg | ORAL_TABLET | Freq: Once | ORAL | Status: DC

## 2017-05-20 MED ORDER — MORPHINE SULFATE (PF) 4 MG/ML IV SOLN
4.0000 mg | Freq: Once | INTRAVENOUS | Status: AC
  Administered 2017-05-20: 4 mg via INTRAVENOUS
  Filled 2017-05-20: qty 1

## 2017-05-20 NOTE — ED Notes (Signed)
Pt. Attempted to use bed pan. She thought she was voiding, but she did not.

## 2017-05-20 NOTE — ED Provider Notes (Signed)
[facility] DEPT Provider Note   CSN: [id number] Arrival date & time: 05/20/17  1803     History   Chief Complaint Chief Complaint  Patient presents with  . Fall    HPI Savannah Howard is a 71 y.o. 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 Patient currently lives at a motel and family went to visit her and noticed that she was on the floor. Patient states that she was putting something wet in the trash can and slipped and fell and hit her head. Patient was not clear if she passed out or not. Family at bedside noticed that she has been very confused today and they visit her daily to give her her meds. She also complains of right leg pain.  The history is provided by the patient and a relative. The history is limited by the condition of the patient.    Past Medical History:  Diagnosis Date  . 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 Allergy    rhinitis  . Anemia   . Antineoplastic chemotherapy induced anemia 04/02/2015  . Anxiety   . B12 deficiency 10/22/2008   Chronic    . Brain cancer ([place]) 06/2016   breast ca with mets to brain  . Brain tumor ([place])   . Breast cancer metastasized to brain [facility]) 07/18/2016   Dr Tammi Klippel - XRT Dr Lindi Adie Dr Ditty S/p craniotomy 12/17  . Breast cancer of upper-outer quadrant of left female breast ([place]) 12/20/2014  . Chemotherapy induced diarrhea 01/08/2015   x5 weeks   Questran Lomotil Consider a probiotic C diff stool test   . DDD (degenerative disc disease), lumbar dx'd in 1980's  . GERD 05/09/2007   Chronic     . GERD (gastroesophageal reflux disease)   . Heart murmur   . History of blood transfusion 2016 X 3 (05/30/2015)  . Hypertension   . Hypomagnesemia 01/07/2017  . Kidney stone 1990's X 1   "passed it"  . Knee pain, left 07/18/2016  . Malignant brain tumor ([place]) 07/18/2016  . Mucositis due to chemotherapy 01/08/2015  . Obesity   . Primary cancer of upper outer quadrant of left breast ([place]) dx'd  03/2015  . Primary malignant neoplasm of left breast with metastasis to other site [facility])   . Rhabdomyolysis 07/10/2016  . Seizure ([place])   . Seizure disorder ([place]) 01/07/2017   Due to brain mets On Keppra  . Syncope 07/10/2016  . Vitamin B 12 deficiency   . Vitamin D deficiency   . Vitamin D deficiency 04/24/2009   Chronic       Patient Active Problem List   Diagnosis Date Noted  . E. coli UTI 04/19/2017  . 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 Seizures ([place]) 04/17/2017  . Primary malignant neoplasm of left breast with metastasis to other site [facility])   . Seizure ([place])   . Hypokalemia 01/07/2017  . Hypomagnesemia 01/07/2017  . Hyperglycemia 01/07/2017  . Seizure disorder ([place]) 01/07/2017  . Altered mental status 01/06/2017  . Palliative care by specialist   . Malignant brain tumor ([place]) 07/18/2016  . Breast cancer metastasized to brain ([place]) 07/18/2016  . Knee pain, left 07/18/2016  . Pressure injury of skin 07/11/2016  . Fall 07/10/2016  . UTI (urinary tract infection) 07/10/2016  . Rhabdomyolysis 07/10/2016  . Syncope 07/10/2016  . 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 Sepsis ([place]) 07/10/2016  . Urinary tract infection with hematuria   .  Brain tumor ([place])   . Antineoplastic chemotherapy induced anemia 04/02/2015  . Weakness generalized 01/29/2015  . Dehydration 01/28/2015  . Rash 01/28/2015  . Chemotherapy induced diarrhea 01/08/2015  . Mucositis due to chemotherapy 01/08/2015  . Breast cancer of upper-outer quadrant of left female breast ([place]) 12/20/2014  . Well adult exam 10/26/2012  . Dyslipidemia 06/23/2011  . Hypertension 12/22/2010  . UPPER RESPIRATORY INFECTION, ACUTE 04/30/2010  . Anxiety 10/28/2009  . Vitamin D deficiency 04/24/2009  . HYPERGLYCEMIA 04/24/2009  . B12 deficiency 10/22/2008  . ALLERGIC RHINITIS 05/09/2007  . GERD 05/09/2007    Past Surgical History:  Procedure Laterality Date  . APPLICATION OF CRANIAL NAVIGATION N/A  07/13/2016   Procedure: APPLICATION OF CRANIAL NAVIGATION;  Surgeon: Kevan Ny Ditty, MD;  Location: [facility];  Service: Neurosurgery;  Laterality: N/A;  . BREAST BIOPSY Left ~ 12/2014  . CHOLECYSTECTOMY OPEN  ~ 1975  . ESOPHAGEAL DILATION  X 3  . MASTECTOMY COMPLETE / SIMPLE W/ SENTINEL NODE BIOPSY Left 05/30/2015  . MASTECTOMY W/ SENTINEL NODE BIOPSY Left 05/30/2015   Procedure: LEFT MASTECTOMY WITH SENTINEL LYMPH NODE BIOPSY;  Surgeon: Autumn Messing III, MD;  Location: [facility];  Service: General;  Laterality: Left;  . PORTACATH PLACEMENT Right 12/26/2014   Procedure: INSERTION PORT-A-CATH;  Surgeon: Autumn Messing III, MD;  Location: [facility];  Service: General;  Laterality: Right;  . PR DURAL GRAFT REPAIR,SPINE DEFECT N/A 07/13/2016   Procedure: Marshia Ly STERIOTACTIC BIOPSY WITH STEALTH;  Surgeon: Kevan Ny Ditty, MD;  Location: [facility];  Service: Neurosurgery;  Laterality: N/A;    OB History    No data available       Home Medications    Prior to Admission medications   Medication Sig Start Date End Date Taking? Authorizing Provider  acetaminophen (TYLENOL) 500 MG tablet Take 1,000 mg by mouth at bedtime as needed for mild pain or headache.     [provider]  Cholecalciferol (VITAMIN D3) 2000 units capsule Take 1 capsule (2,000 Units total) by mouth daily. 02/03/17   [name], [name], MD  cyanocobalamin (,VITAMIN B-12,) 1000 MCG/ML injection INJECT 1 ML SUBCUTANEOUSLY EVERY 30 DAYS 02/08/17   [name], [name], MD  dexamethasone (DECADRON) 4 MG tablet Take 1 tablet (4 mg total) by mouth every 12 (twelve) hours. 04/20/17   Eugenie Filler, MD  lacosamide 100 MG TABS Take 1 tablet (100 mg total) by mouth 2 (two) times daily. 04/20/17   Eugenie Filler, MD  levETIRAcetam (KEPPRA) 500 MG tablet Take 1 tablet (500 mg total) by mouth 2 (two) times daily. 04/20/17   Eugenie Filler, MD  LORazepam (ATIVAN) 0.5 MG tablet TAKE 1 TABLET BY MOUTH 3 TIMES A DAY AS  NEEDED FOR ANXIETY 03/29/17   [name], [name], MD  magnesium gluconate (MAGONATE) 500 MG tablet Take 1 tablet (500 mg total) by mouth 2 (two) times daily. 02/02/17   [name], [name], MD  metoprolol succinate (TOPROL-XL) 50 MG 24 hr tablet TAKE 1/2 TABLET (25 MG TOTAL) BY MOUTH EVERY DAY 03/29/17   [name], [name], MD  omeprazole (PRILOSEC) 40 MG capsule Take 1 capsule (40 mg total) by mouth daily. 04/20/17   Eugenie Filler, MD  ondansetron (ZOFRAN) 8 MG tablet Take 1 tablet (8 mg total) by mouth every 8 (eight) hours as needed for nausea or vomiting. 04/02/17   Gardenia Phlegm, NP  senna (SENOKOT) 8.6 MG TABS tablet Take 1 tablet (8.6 mg total) by mouth  2 (two) times daily. 04/20/17   Eugenie Filler, MD    Family History Family History  Problem Relation Age of Onset  . Heart disease Mother        CAD  . COPD Mother   . Heart disease Father 14       leaky valve  . Parkinsonism Father   . Hypertension Other     Social History Social History  Substance Use Topics  . Smoking status: Never Smoker  . Smokeless tobacco: Never Used  . Alcohol use Yes     Comment: 05/30/2015 "I might take a drink of wine 1-2 times/yr"     Allergies   Penicillins and Sulfonamide derivatives   Review of Systems Review of Systems  Musculoskeletal:       R leg pain   Psychiatric/Behavioral: Positive for confusion.  All other systems reviewed and are negative.    Physical Exam Updated Vital Signs BP (!) 144/96   Pulse 67   Temp 97.9 F (36.6 C) (Oral)   Resp 15   SpO2 94%   Physical Exam  Constitutional:  Chronically ill   HENT:  Mouth/Throat: Oropharynx is clear and moist.  Large R posterior scalp hematoma around the craniotomy site, no obvious laceration  Eyes: Pupils are equal, round, and reactive to light. Conjunctivae and EOM are normal.  Neck: Neck supple.  Cardiovascular: Normal rate, regular rhythm and normal heart sounds.   Pulmonary/Chest: Effort  normal and breath sounds normal. No respiratory distress. She has no wheezes.  Abdominal: Soft. Bowel sounds are normal. She exhibits no distension. There is no tenderness.  Musculoskeletal:  No obvious spinal tenderness. Nl ROM bilateral hips. Bruising R lower leg   Neurological:  Confused, A & O x 2. Nl strength throughout   Skin: Skin is warm.  Psychiatric: She has a normal mood and affect.  Nursing note and vitals reviewed.    ED Treatments / Results  Labs (all labs ordered are listed, but only abnormal results are displayed) Labs Reviewed  CBC WITH DIFFERENTIAL/PLATELET  COMPREHENSIVE METABOLIC PANEL  I-STAT TROPONIN, ED    EKG  EKG Interpretation None       Radiology No results found.  Procedures Procedures (including critical care time)  Medications Ordered in ED Medications  morphine 4 MG/ML injection 4 mg (not administered)  sodium chloride 0.9 % bolus 1,000 mL (not administered)     Initial Impression / Assessment and Plan / ED Course  I have reviewed the triage vital signs and the nursing notes.  Pertinent labs & imaging results that were available during my care of the patient were reviewed by me and considered in my medical decision making (see chart for details).     Savannah Howard is a 71 y.o. female here with fall. Found on floor by family. Patient claims that she had mechanical fall but I am concerned for syncope and she appears confused. Has brain mets from breast cancer so at risk for bleeding. Will get labs, EKG, CT head/neck, xrays.   9:58 PM Trop elevated at 1.7, no STEMI on EKG. I repeated it and it is still the same. CT head/neck and xrays and labs otherwise unremarkable. Still confused per family. Called Dr. Raiford Simmonds from cardiology, who recommend trend troponin for now. No heparin or ASA given scalp hematoma. Cardiology to consult in AM. Hospitalist to admit for elevated troponin, possible syncope.   Final Clinical Impressions(s) / ED  Diagnoses   Final diagnoses:  None  New Prescriptions New Prescriptions   No medications on file     Drenda Freeze, MD 05/20/17 2159

## 2017-05-20 NOTE — ED Triage Notes (Signed)
Patient is from home for an evaluation post fall with hematoma noted to right posterior skull.  Unknown LOC and patient is not on thinners.  Hx of brain cancer and tumors.  Mastectomy  to left side. Patient is A&Ox4 to baseline per POA.

## 2017-05-20 NOTE — ED Notes (Signed)
I stat troponin results given to Dr. Darl Householder by B. Yolanda Bonine, EMT

## 2017-05-20 NOTE — H&P (Signed)
History and Physical    Savannah Howard ZOX:[id number] DOB: 1946-07-30 DOA: 05/20/2017  PCP: [name], MD  Patient coming from:  home  Chief Complaint:   fell  HPI: Savannah Howard is a 71 y.o. 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  She slid and fell so came to ED and she hit her head but she says she did not pass out.  Family earlier however reported she was confused.  She has a big bump on her head from the fall.  She denies any pain.  Denies any recent illnesses.  Can move her arms/legs without pain.  A trop was checked for possible syncope and it was noted to be 1.75.  Cardiology was consulted and pt referred for admission for elevated trop.  She has no chest pain or sob.  Review of Systems: As per HPI otherwise 10 point review of systems negative.   Past Medical History:  Diagnosis Date  . 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 Allergy    rhinitis  . Anemia   . Antineoplastic chemotherapy induced anemia 04/02/2015  . Anxiety   . B12 deficiency 10/22/2008   Chronic    . Brain cancer ([place]) 06/2016   breast ca with mets to brain  . Brain tumor ([place])   . Breast cancer metastasized to brain [facility]) 07/18/2016   Dr Tammi Klippel - XRT Dr Lindi Adie Dr Ditty S/p craniotomy 12/17  . Breast cancer of upper-outer quadrant of left female breast ([place]) 12/20/2014  . Chemotherapy induced diarrhea 01/08/2015   x5 weeks   Questran Lomotil Consider a probiotic C diff stool test   . DDD (degenerative disc disease), lumbar dx'd in 1980's  . GERD 05/09/2007   Chronic     . GERD (gastroesophageal reflux disease)   . Heart murmur   . History of blood transfusion 2016 X 3 (05/30/2015)  . Hypertension   . Hypomagnesemia 01/07/2017  . Kidney stone 1990's X 1   "passed it"  . Knee pain, left 07/18/2016  . Malignant brain tumor ([place]) 07/18/2016  . Mucositis due to chemotherapy  01/08/2015  . Obesity   . Primary cancer of upper outer quadrant of left breast ([place]) dx'd 03/2015  . Primary malignant neoplasm of left breast with metastasis to other site [facility])   . Rhabdomyolysis 07/10/2016  . Seizure ([place])   . Seizure disorder ([place]) 01/07/2017   Due to brain mets On Keppra  . Syncope 07/10/2016  . Vitamin B 12 deficiency   . Vitamin D deficiency   . Vitamin D deficiency 04/24/2009   Chronic       Past Surgical History:  Procedure Laterality Date  . APPLICATION OF CRANIAL NAVIGATION N/A 07/13/2016   Procedure: APPLICATION OF CRANIAL NAVIGATION;  Surgeon: Kevan Ny Ditty, MD;  Location: [place];  Service: Neurosurgery;  Laterality: N/A;  . BREAST BIOPSY Left ~ 12/2014  . CHOLECYSTECTOMY OPEN  ~ 1975  . ESOPHAGEAL DILATION  X 3  . MASTECTOMY COMPLETE / SIMPLE W/ SENTINEL NODE BIOPSY Left 05/30/2015  . MASTECTOMY W/ SENTINEL NODE BIOPSY Left 05/30/2015   Procedure: LEFT MASTECTOMY WITH SENTINEL LYMPH NODE BIOPSY;  Surgeon: Autumn Messing III, MD;  Location: [place];  Service: General;  Laterality: Left;  . PORTACATH PLACEMENT Right 12/26/2014   Procedure: INSERTION PORT-A-CATH;  Surgeon: Autumn Messing III, MD;  Location: [place];  Service: General;  Laterality: Right;  . PR DURAL GRAFT REPAIR,SPINE DEFECT N/A 07/13/2016   Procedure: Marshia Ly STERIOTACTIC BIOPSY WITH STEALTH;  Surgeon: Kevan Ny Ditty, MD;  Location: [place];  Service: Neurosurgery;  Laterality: N/A;     reports that she has never smoked. She has never used smokeless tobacco. She reports that she drinks alcohol. She reports that she does not use drugs.  Allergies  Allergen Reactions  . Penicillins Swelling and Other (See Comments)    Genital swelling/skin peeled/yeast infection Has patient had a PCN reaction causing immediate rash, facial/tongue/throat swelling, SOB or lightheadedness with hypotension: Yes Has patient had a PCN reaction causing severe rash involving mucus membranes or skin  necrosis: Unknown Has patient had a PCN reaction that required hospitalization: Unknown Has patient had a PCN reaction occurring within the last 10 years: Unknown If all of the above answers are "NO", then may proceed with   . Sulfonamide Derivatives Other (See Comments)    Convulsions      Family History  Problem Relation Age of Onset  . Heart disease Mother        CAD  . COPD Mother   . Heart disease Father 54       leaky valve  . Parkinsonism Father   . Hypertension Other     Prior to Admission medications   Medication Sig Start Date End Date Taking? Authorizing Provider  acetaminophen (TYLENOL) 500 MG tablet Take 1,000 mg by mouth at bedtime as needed for mild pain or headache.    Yes [provider]  Cholecalciferol (VITAMIN D3) 2000 units capsule Take 1 capsule (2,000 Units total) by mouth daily. 02/03/17  Yes [name], [name], MD  cyanocobalamin (,VITAMIN B-12,) 1000 MCG/ML injection INJECT 1 ML SUBCUTANEOUSLY EVERY 30 DAYS 02/08/17  Yes [name], [name], MD  dexamethasone (DECADRON) 4 MG tablet Take 1 tablet (4 mg total) by mouth every 12 (twelve) hours. 04/20/17  Yes Eugenie Filler, MD  lacosamide 100 MG TABS Take 1 tablet (100 mg total) by mouth 2 (two) times daily. 04/20/17  Yes Eugenie Filler, MD  levETIRAcetam (KEPPRA) 500 MG tablet Take 1 tablet (500 mg total) by mouth 2 (two) times daily. 04/20/17  Yes Eugenie Filler, MD  LORazepam (ATIVAN) 0.5 MG tablet TAKE 1 TABLET BY MOUTH 3 TIMES A DAY AS NEEDED FOR ANXIETY 03/29/17  Yes [name], [name], MD  magnesium gluconate (MAGONATE) 500 MG tablet Take 1 tablet (500 mg total) by mouth 2 (two) times daily. 02/02/17  Yes [name], [name], MD  metoprolol succinate (TOPROL-XL) 50 MG 24 hr tablet TAKE 1/2 TABLET (25 MG TOTAL) BY MOUTH EVERY DAY 03/29/17  Yes [name], [name], MD  omeprazole (PRILOSEC) 40 MG capsule Take 1 capsule (40 mg total) by mouth daily. 04/20/17  Yes Eugenie Filler, MD    ondansetron (ZOFRAN) 8 MG tablet Take 1 tablet (8 mg total) by mouth every 8 (eight) hours as needed for nausea or vomiting. 04/02/17  Yes Causey, Charlestine Massed, NP  senna (SENOKOT) 8.6 MG TABS tablet Take 1 tablet (8.6 mg total) by mouth 2 (two) times daily. Patient not taking: Reported on 05/20/2017 04/20/17   Eugenie Filler, MD    Physical Exam: Vitals:   05/20/17 1807 05/20/17 1808 05/20/17 1815 05/20/17 2012  BP:   (!) 167/91 132/74  Pulse:   73 69  Resp: 15  (!) 21 14  Temp:  97.9 F (36.6 C)    TempSrc:  Oral    SpO2:   98% 97%      Constitutional: NAD, calm, comfortable , cushionoid appearance Vitals:   05/20/17 1807 05/20/17 1808 05/20/17 1815 05/20/17 2012  BP:   (!) 167/91 132/74  Pulse:   73 69  Resp: 15  (!) 21 14  Temp:  97.9 F (36.6 C)    TempSrc:  Oral    SpO2:   98% 97%   Eyes: PERRL, lids and conjunctivae normal, hematoma to scalp ENMT: Mucous membranes are moist. Posterior pharynx clear of any exudate or lesions.Normal dentition.  Neck: normal, supple, no masses, no thyromegaly Respiratory: clear to auscultation bilaterally, no wheezing, no crackles. Normal respiratory effort. No accessory muscle use.  Cardiovascular: Regular rate and rhythm, no murmurs / rubs / gallops. No extremity edema. 2+ pedal pulses. No carotid bruits.  Abdomen: no tenderness, no masses palpated. No hepatosplenomegaly. Bowel sounds positive.  Musculoskeletal: no clubbing / cyanosis. No joint deformity upper and lower extremities. Good ROM, no contractures. Normal muscle tone.  Skin: no rashes, lesions, ulcers. No induration Neurologic: CN 2-12 grossly intact. Sensation intact, DTR normal. Strength 5/5 in all 4.  Psychiatric: Normal judgment and insight. Alert and oriented x 3. Normal mood.    Labs on Admission: I have personally reviewed following labs and imaging studies  CBC:  Recent Labs Lab 05/20/17 1912  WBC 13.8*  NEUTROABS 10.7*  HGB 13.7  HCT 40.3  MCV  100.2*  PLT 976   Basic Metabolic Panel:  Recent Labs Lab 05/20/17 1912  NA 137  K 3.5  CL 99*  CO2 26  GLUCOSE 127*  BUN 25*  CREATININE 1.13*  CALCIUM 8.7*   GFR: Estimated Creatinine Clearance: 46.6 mL/min (A) (by C-G formula based on SCr of 1.13 mg/dL (H)). Liver Function Tests:  Recent Labs Lab 05/20/17 1912  AST 26  ALT 23  ALKPHOS 66  BILITOT 1.0  PROT 5.8*  ALBUMIN 3.1*   Cardiac Enzymes:  Recent Labs Lab 05/20/17 1912  CKTOTAL 81    Radiological Exams on Admission: Ct Head Wo Contrast  Result Date: 05/20/2017 CLINICAL DATA:  71 year old female status post fall with right posterior scalp hematoma. Treated parenchymal and dural brain metastases from breast cancer. EXAM: CT HEAD WITHOUT CONTRAST CT CERVICAL SPINE WITHOUT CONTRAST TECHNIQUE: Multidetector CT imaging of the head and cervical spine was performed following the standard protocol without intravenous contrast. Multiplanar CT image reconstructions of the cervical spine were also generated. COMPARISON:  Brain MRI 04/18/2017 and earlier. Head CT without contrast 04/17/2017. CT cervical spine 07/10/2016. FINDINGS: CT HEAD FINDINGS Brain: Right temporal lobe resection cavity and treatment site appears stable. No intracranial mass effect. Stable and normal gray-white matter differentiation elsewhere. Incidental bulky dural calcification. No midline shift, ventriculomegaly, mass effect, intracranial hemorrhage or evidence of cortically based acute infarction. Vascular: Calcified atherosclerosis at the skull base. Skull: Previous right frontotemporal craniotomy. Chronic hyperostosis of the calvarium, normal variant. No acute osseous abnormality identified. Sinuses/Orbits: Stable. Other: Broad-based right posterior scalp contusion or hematoma (33). No underlying skull fracture. Stable scalp and orbits soft tissues otherwise. CT CERVICAL SPINE FINDINGS Alignment: Stable cervical lordosis. Stable subtle anterolisthesis  of C4 on C5. Cervicothoracic junction alignment is within normal limits. Bilateral posterior element alignment is within normal limits. Skull base and vertebrae: Visualized skull base is intact. No atlanto-occipital dissociation. No cervical spine fracture identified. Possible degenerative developing posterior element ankylosis at several levels. Soft tissues and spinal canal: No prevertebral fluid or swelling. No visible canal  hematoma. Right IJ approach porta cath partially visible. Stable neck soft tissues. Disc levels: Mild for age cervical spine degeneration. Chronic disc and endplate degeneration at C5-C6 with left eccentric disc osteophyte complex in possible mild spinal stenosis. Upper chest: Visible upper thoracic levels appear intact. Negative lung apices. Mediastinal lipomatosis. IMPRESSION: 1. Right scalp hematoma without underlying fracture. 2.  Stable non contrast CT appearance of the brain since September. 3.  No acute fracture or listhesis in the cervical spine. Electronically Signed   By: Genevie Ann M.D.   On: 05/20/2017 21:08   Ct Cervical Spine Wo Contrast  Result Date: 05/20/2017 CLINICAL DATA:  71 year old female status post fall with right posterior scalp hematoma. Treated parenchymal and dural brain metastases from breast cancer. EXAM: CT HEAD WITHOUT CONTRAST CT CERVICAL SPINE WITHOUT CONTRAST TECHNIQUE: Multidetector CT imaging of the head and cervical spine was performed following the standard protocol without intravenous contrast. Multiplanar CT image reconstructions of the cervical spine were also generated. COMPARISON:  Brain MRI 04/18/2017 and earlier. Head CT without contrast 04/17/2017. CT cervical spine 07/10/2016. FINDINGS: CT HEAD FINDINGS Brain: Right temporal lobe resection cavity and treatment site appears stable. No intracranial mass effect. Stable and normal gray-white matter differentiation elsewhere. Incidental bulky dural calcification. No midline shift, ventriculomegaly,  mass effect, intracranial hemorrhage or evidence of cortically based acute infarction. Vascular: Calcified atherosclerosis at the skull base. Skull: Previous right frontotemporal craniotomy. Chronic hyperostosis of the calvarium, normal variant. No acute osseous abnormality identified. Sinuses/Orbits: Stable. Other: Broad-based right posterior scalp contusion or hematoma (33). No underlying skull fracture. Stable scalp and orbits soft tissues otherwise. CT CERVICAL SPINE FINDINGS Alignment: Stable cervical lordosis. Stable subtle anterolisthesis of C4 on C5. Cervicothoracic junction alignment is within normal limits. Bilateral posterior element alignment is within normal limits. Skull base and vertebrae: Visualized skull base is intact. No atlanto-occipital dissociation. No cervical spine fracture identified. Possible degenerative developing posterior element ankylosis at several levels. Soft tissues and spinal canal: No prevertebral fluid or swelling. No visible canal hematoma. Right IJ approach porta cath partially visible. Stable neck soft tissues. Disc levels: Mild for age cervical spine degeneration. Chronic disc and endplate degeneration at C5-C6 with left eccentric disc osteophyte complex in possible mild spinal stenosis. Upper chest: Visible upper thoracic levels appear intact. Negative lung apices. Mediastinal lipomatosis. IMPRESSION: 1. Right scalp hematoma without underlying fracture. 2.  Stable non contrast CT appearance of the brain since September. 3.  No acute fracture or listhesis in the cervical spine. Electronically Signed   By: Genevie Ann M.D.   On: 05/20/2017 21:08   Dg Pelvis Portable  Result Date: 05/20/2017 CLINICAL DATA:  71 year old female status post fall. EXAM: PORTABLE PELVIS 1-2 VIEWS COMPARISON:  07/09/2016. FINDINGS: Portable AP view at 1942 hours. Chronic dystrophic calcification about the greater trochanters, more so the left. Femoral heads are normally located. Hip joint spaces  appear stable. Proximal femurs appear grossly intact. Pelvis appears stable and intact. SI joints appear within normal limits. Negative visible bowel gas pattern. IMPRESSION: No acute fracture or dislocation identified about the pelvis. If there is lateralizing hip pain, then a dedicated hip series is recommended. Electronically Signed   By: Genevie Ann M.D.   On: 05/20/2017 20:09   Dg Chest Portable 1 View  Result Date: 05/20/2017 CLINICAL DATA:  Post fall. EXAM: PORTABLE CHEST 1 VIEW COMPARISON:  07/12/2016 FINDINGS: Right IJ Port-A-Cath unchanged. Lungs are adequately inflated and otherwise clear. Borderline stable cardiomegaly. Calcified plaque over the thoracic aorta. Diffuse  osteopenia. Mild degenerate change of the spine. Surgical clips of the left axilla. IMPRESSION: No acute cardiopulmonary disease. Aortic Atherosclerosis (ICD10-I70.0). Electronically Signed   By: Marin Olp M.D.   On: 05/20/2017 20:08   Dg Tibia/fibula Right Port  Result Date: 05/20/2017 CLINICAL DATA:  71 year old female status post fall with midshaft right tibia bruise. EXAM: PORTABLE RIGHT TIBIA AND FIBULA - 2 VIEW COMPARISON:  None. FINDINGS: There is no evidence of fracture or other focal bone lesions. Degenerative changes at the right knee and ankle. Calcified peripheral vascular disease. Mild soft tissue swelling anterior to the tibia. IMPRESSION: No acute fracture or dislocation identified about the right tib-fib. Electronically Signed   By: Genevie Ann M.D.   On: 05/20/2017 20:08     Assessment/Plan 71 yo female with fall found to have elevated troponin  Principal Problem:   Elevated troponin- serial overnight.  Cardiology consult dr hilty who advised no aspirin or heparin at this time due to head injury.  Further management per cards team.  No cp.  ekg nonischemic.  Active Problems:   Fall- noted, no injury   Acute on chronic kidney failure-II- stable   Breast cancer metastasized to brain ([place])- noted, cont  outpt oncology follow up   Altered mental status- seems to be normal now, ct head shows scalp hematoma no other issues   Seizure disorder ([place])- noted   Home med list pending   DVT prophylaxis:  scds Code Status:  full Family Communication:  none Disposition Plan:  Per day team Consults called:  cardiology Admission status:  observation   DAVID,RACHAL A MD [facility]  If 7PM-7AM, please contact night-coverage [email or web address] Password [email or web address]  05/20/2017, 11:07 PM

## 2017-05-21 ENCOUNTER — Observation Stay (HOSPITAL_BASED_OUTPATIENT_CLINIC_OR_DEPARTMENT_OTHER): Payer: Medicare Other

## 2017-05-21 DIAGNOSIS — E669 Obesity, unspecified: Secondary | ICD-10-CM | POA: Diagnosis present

## 2017-05-21 DIAGNOSIS — I130 Hypertensive heart and chronic kidney disease with heart failure and stage 1 through stage 4 chronic kidney disease, or unspecified chronic kidney disease: Secondary | ICD-10-CM | POA: Diagnosis present

## 2017-05-21 DIAGNOSIS — R404 Transient alteration of awareness: Secondary | ICD-10-CM

## 2017-05-21 DIAGNOSIS — R931 Abnormal findings on diagnostic imaging of heart and coronary circulation: Secondary | ICD-10-CM

## 2017-05-21 DIAGNOSIS — Z880 Allergy status to penicillin: Secondary | ICD-10-CM | POA: Diagnosis not present

## 2017-05-21 DIAGNOSIS — N170 Acute kidney failure with tubular necrosis: Secondary | ICD-10-CM | POA: Diagnosis not present

## 2017-05-21 DIAGNOSIS — B9620 Unspecified Escherichia coli [E. coli] as the cause of diseases classified elsewhere: Secondary | ICD-10-CM | POA: Diagnosis present

## 2017-05-21 DIAGNOSIS — W19XXXA Unspecified fall, initial encounter: Secondary | ICD-10-CM | POA: Diagnosis not present

## 2017-05-21 DIAGNOSIS — Z923 Personal history of irradiation: Secondary | ICD-10-CM | POA: Diagnosis not present

## 2017-05-21 DIAGNOSIS — N179 Acute kidney failure, unspecified: Secondary | ICD-10-CM | POA: Diagnosis present

## 2017-05-21 DIAGNOSIS — R9431 Abnormal electrocardiogram [ECG] [EKG]: Secondary | ICD-10-CM

## 2017-05-21 DIAGNOSIS — I255 Ischemic cardiomyopathy: Secondary | ICD-10-CM | POA: Diagnosis not present

## 2017-05-21 DIAGNOSIS — I214 Non-ST elevation (NSTEMI) myocardial infarction: Secondary | ICD-10-CM | POA: Diagnosis not present

## 2017-05-21 DIAGNOSIS — Y9389 Activity, other specified: Secondary | ICD-10-CM | POA: Diagnosis not present

## 2017-05-21 DIAGNOSIS — Z9221 Personal history of antineoplastic chemotherapy: Secondary | ICD-10-CM | POA: Diagnosis not present

## 2017-05-21 DIAGNOSIS — N390 Urinary tract infection, site not specified: Secondary | ICD-10-CM | POA: Diagnosis present

## 2017-05-21 DIAGNOSIS — R410 Disorientation, unspecified: Secondary | ICD-10-CM | POA: Diagnosis not present

## 2017-05-21 DIAGNOSIS — Y9259 Other trade areas as the place of occurrence of the external cause: Secondary | ICD-10-CM | POA: Diagnosis not present

## 2017-05-21 DIAGNOSIS — W010XXA Fall on same level from slipping, tripping and stumbling without subsequent striking against object, initial encounter: Secondary | ICD-10-CM | POA: Diagnosis present

## 2017-05-21 DIAGNOSIS — C50412 Malignant neoplasm of upper-outer quadrant of left female breast: Secondary | ICD-10-CM | POA: Diagnosis not present

## 2017-05-21 DIAGNOSIS — F4320 Adjustment disorder, unspecified: Secondary | ICD-10-CM | POA: Diagnosis not present

## 2017-05-21 DIAGNOSIS — R748 Abnormal levels of other serum enzymes: Secondary | ICD-10-CM | POA: Diagnosis not present

## 2017-05-21 DIAGNOSIS — C50912 Malignant neoplasm of unspecified site of left female breast: Secondary | ICD-10-CM | POA: Diagnosis not present

## 2017-05-21 DIAGNOSIS — Z882 Allergy status to sulfonamides status: Secondary | ICD-10-CM | POA: Diagnosis not present

## 2017-05-21 DIAGNOSIS — Z853 Personal history of malignant neoplasm of breast: Secondary | ICD-10-CM | POA: Diagnosis not present

## 2017-05-21 DIAGNOSIS — C7931 Secondary malignant neoplasm of brain: Secondary | ICD-10-CM | POA: Diagnosis not present

## 2017-05-21 DIAGNOSIS — R55 Syncope and collapse: Secondary | ICD-10-CM | POA: Diagnosis present

## 2017-05-21 DIAGNOSIS — Z79899 Other long term (current) drug therapy: Secondary | ICD-10-CM | POA: Diagnosis not present

## 2017-05-21 DIAGNOSIS — N183 Chronic kidney disease, stage 3 (moderate): Secondary | ICD-10-CM | POA: Diagnosis not present

## 2017-05-21 DIAGNOSIS — S8011XA Contusion of right lower leg, initial encounter: Secondary | ICD-10-CM | POA: Diagnosis present

## 2017-05-21 DIAGNOSIS — R110 Nausea: Secondary | ICD-10-CM | POA: Diagnosis present

## 2017-05-21 DIAGNOSIS — Z85841 Personal history of malignant neoplasm of brain: Secondary | ICD-10-CM | POA: Diagnosis not present

## 2017-05-21 DIAGNOSIS — G40909 Epilepsy, unspecified, not intractable, without status epilepticus: Secondary | ICD-10-CM | POA: Diagnosis not present

## 2017-05-21 DIAGNOSIS — Z515 Encounter for palliative care: Secondary | ICD-10-CM | POA: Diagnosis not present

## 2017-05-21 DIAGNOSIS — Z6836 Body mass index (BMI) 36.0-36.9, adult: Secondary | ICD-10-CM | POA: Diagnosis not present

## 2017-05-21 DIAGNOSIS — K219 Gastro-esophageal reflux disease without esophagitis: Secondary | ICD-10-CM | POA: Diagnosis present

## 2017-05-21 DIAGNOSIS — I5020 Unspecified systolic (congestive) heart failure: Secondary | ICD-10-CM | POA: Diagnosis present

## 2017-05-21 DIAGNOSIS — N189 Chronic kidney disease, unspecified: Secondary | ICD-10-CM | POA: Diagnosis present

## 2017-05-21 DIAGNOSIS — S0003XA Contusion of scalp, initial encounter: Secondary | ICD-10-CM | POA: Diagnosis present

## 2017-05-21 LAB — URINALYSIS, ROUTINE W REFLEX MICROSCOPIC
Bilirubin Urine: NEGATIVE
Glucose, UA: NEGATIVE mg/dL
HGB URINE DIPSTICK: NEGATIVE
Ketones, ur: NEGATIVE mg/dL
LEUKOCYTES UA: NEGATIVE
NITRITE: NEGATIVE
PH: 6 (ref 5.0–8.0)
Protein, ur: NEGATIVE mg/dL
SPECIFIC GRAVITY, URINE: 1.015 (ref 1.005–1.030)

## 2017-05-21 LAB — ECHOCARDIOGRAM COMPLETE
Height: 62 in
Weight: 3182.4 oz

## 2017-05-21 LAB — TROPONIN I
TROPONIN I: 2.29 ng/mL — AB (ref <0.03)
Troponin I: 2.13 ng/mL (ref <0.03)

## 2017-05-21 MED ORDER — CARVEDILOL 3.125 MG PO TABS
3.1250 mg | ORAL_TABLET | Freq: Two times a day (BID) | ORAL | Status: DC
  Administered 2017-05-22 – 2017-05-26 (×10): 3.125 mg via ORAL
  Filled 2017-05-21 (×10): qty 1

## 2017-05-21 MED ORDER — ACETAMINOPHEN 325 MG PO TABS
650.0000 mg | ORAL_TABLET | ORAL | Status: DC | PRN
  Administered 2017-05-21 – 2017-05-24 (×6): 650 mg via ORAL
  Filled 2017-05-21 (×7): qty 2

## 2017-05-21 MED ORDER — ATORVASTATIN CALCIUM 20 MG PO TABS
20.0000 mg | ORAL_TABLET | Freq: Every day | ORAL | Status: DC
Start: 2017-05-22 — End: 2017-05-26
  Administered 2017-05-22 – 2017-05-26 (×5): 20 mg via ORAL
  Filled 2017-05-21 (×5): qty 1

## 2017-05-21 MED ORDER — METOPROLOL SUCCINATE ER 25 MG PO TB24
25.0000 mg | ORAL_TABLET | Freq: Every day | ORAL | Status: DC
  Administered 2017-05-21: 25 mg via ORAL
  Filled 2017-05-21: qty 1

## 2017-05-21 MED ORDER — LEVETIRACETAM 500 MG PO TABS
500.0000 mg | ORAL_TABLET | Freq: Two times a day (BID) | ORAL | Status: DC
  Administered 2017-05-21 – 2017-05-26 (×13): 500 mg via ORAL
  Filled 2017-05-21 (×13): qty 1

## 2017-05-21 MED ORDER — LACOSAMIDE 50 MG PO TABS
100.0000 mg | ORAL_TABLET | Freq: Two times a day (BID) | ORAL | Status: DC
  Administered 2017-05-21 – 2017-05-26 (×13): 100 mg via ORAL
  Filled 2017-05-21 (×13): qty 2

## 2017-05-21 MED ORDER — SPIRONOLACTONE 25 MG PO TABS
12.5000 mg | ORAL_TABLET | Freq: Every day | ORAL | Status: DC
  Administered 2017-05-21 – 2017-05-26 (×6): 12.5 mg via ORAL
  Filled 2017-05-21 (×6): qty 1

## 2017-05-21 MED ORDER — ONDANSETRON HCL 4 MG/2ML IJ SOLN
4.0000 mg | Freq: Four times a day (QID) | INTRAMUSCULAR | Status: DC | PRN
  Administered 2017-05-23 – 2017-05-24 (×2): 4 mg via INTRAVENOUS
  Filled 2017-05-21 (×2): qty 2

## 2017-05-21 MED ORDER — ASPIRIN EC 81 MG PO TBEC
81.0000 mg | DELAYED_RELEASE_TABLET | Freq: Every day | ORAL | Status: DC
  Administered 2017-05-21 – 2017-05-26 (×6): 81 mg via ORAL
  Filled 2017-05-21 (×6): qty 1

## 2017-05-21 NOTE — Progress Notes (Signed)
Patient is alert and able to recall her incident to me and the doctors.  She is calm and obeys commands.

## 2017-05-21 NOTE — Consult Note (Signed)
Cardiology Consultation:   Patient ID: Savannah Howard; [id number]; Aug 15, 1945   Admit date: 05/20/2017 Date of Consult: 05/21/2017  Primary Care Provider: [name], MD Primary Cardiologist: Dr. Haroldine Laws (Cardio-oncology clinic)   Patient Profile:   Savannah Howard is a 71 y.o. female with a hx of HTN, obesity and left breast cancer s/p [facility] chemotherapy and left mastectoy who is being seen today for the evaluation of elevated troponin at the request of Dr. Maryland Pink.  History of Present Illness:   Ms. Felling is followed in the cardio-oncology clinic by Dr. Mahalia Longest. She initially completed chemo and Herceptin in 2016. Unfortunately found to have relapse with brain mets in 12/17. Underwent craniotomy for resection and XRT. Started on lapatanib but recently stopped due to diarrhea. Gets around at home with walker. Remains very weak.  She presented to the ED yesterday after falling due to tripping over her trash bag of wet depends in her hotel. She hit her head but denied passing out. The patient's family apparently thought she was confused earlier. A troponin was checked for possible syncope and was elevated at 1.75. Subsequent troponin levels were 2.29 and 2.13.   An echo done today shows a newly decreased EF 30-35% and new wall motion abnormalities.   Upon exam the patient is alert and oriented. She denies any chest pain/pressure prior to, during or after her fall which she feels was mechanical.  She denies shortness of breath, palpitations or dizziness. Her only complaint currently is mild nausea.   Pt was hospitalized in early September for seizures and had UTI at the time. MRI was negative for acute CVA at the time and showed stable metastatic disease.   SCr is 1.13,   K+ 3.5 Hgb 13.7,  WBC 13.8 Urine with 6-30 WBC, rare bacteria CXR: No acute cardiopulmonary disease.  CT head: Right scalp hematoma without underlying fracture. Stable non contrast CT appearance of the  brain since September. No acute fracture or listhesis in the cervical spine.  Past Medical History:  Diagnosis Date  . 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 Allergy    rhinitis  . Anemia   . Antineoplastic chemotherapy induced anemia 04/02/2015  . Anxiety   . B12 deficiency 10/22/2008   Chronic    . Brain cancer ([place]) 06/2016   breast ca with mets to brain  . Brain tumor ([place])   . Breast cancer metastasized to brain [facility]) 07/18/2016   Dr Tammi Klippel - XRT Dr Lindi Adie Dr Ditty S/p craniotomy 12/17  . Breast cancer of upper-outer quadrant of left female breast ([place]) 12/20/2014  . Chemotherapy induced diarrhea 01/08/2015   x5 weeks   Questran Lomotil Consider a probiotic C diff stool test   . DDD (degenerative disc disease), lumbar dx'd in 1980's  . GERD 05/09/2007   Chronic     . GERD (gastroesophageal reflux disease)   . Heart murmur   . History of blood transfusion 2016 X 3 (05/30/2015)  . Hypertension   . Hypomagnesemia 01/07/2017  . Kidney stone 1990's X 1   "passed it"  . Knee pain, left 07/18/2016  . Malignant brain tumor ([place]) 07/18/2016  . Mucositis due to chemotherapy 01/08/2015  . Obesity   . Primary cancer of upper outer quadrant of left breast ([place]) dx'd 03/2015  . Primary malignant neoplasm of left breast with metastasis to other site [facility])   . Rhabdomyolysis 07/10/2016  . Seizure ([place])   . Seizure disorder ([place]) 01/07/2017   Due  to brain mets On Keppra  . Syncope 07/10/2016  . Vitamin B 12 deficiency   . Vitamin D deficiency   . Vitamin D deficiency 04/24/2009   Chronic       Past Surgical History:  Procedure Laterality Date  . APPLICATION OF CRANIAL NAVIGATION N/A 07/13/2016   Procedure: APPLICATION OF CRANIAL NAVIGATION;  Surgeon: Kevan Ny Ditty, MD;  Location: [place];  Service: Neurosurgery;  Laterality: N/A;  . BREAST BIOPSY Left ~ 12/2014  . CHOLECYSTECTOMY OPEN  ~ 1975  . ESOPHAGEAL DILATION  X 3  . MASTECTOMY COMPLETE / SIMPLE W/ SENTINEL NODE BIOPSY  Left 05/30/2015  . MASTECTOMY W/ SENTINEL NODE BIOPSY Left 05/30/2015   Procedure: LEFT MASTECTOMY WITH SENTINEL LYMPH NODE BIOPSY;  Surgeon: Autumn Messing III, MD;  Location: [place];  Service: General;  Laterality: Left;  . PORTACATH PLACEMENT Right 12/26/2014   Procedure: INSERTION PORT-A-CATH;  Surgeon: Autumn Messing III, MD;  Location: [place];  Service: General;  Laterality: Right;  . PR DURAL GRAFT REPAIR,SPINE DEFECT N/A 07/13/2016   Procedure: Marshia Ly STERIOTACTIC BIOPSY WITH STEALTH;  Surgeon: Kevan Ny Ditty, MD;  Location: [place];  Service: Neurosurgery;  Laterality: N/A;     Home Medications:  Prior to Admission medications   Medication Sig Start Date End Date Taking? Authorizing Provider  acetaminophen (TYLENOL) 500 MG tablet Take 1,000 mg by mouth at bedtime as needed for mild pain or headache.    Yes [provider]  Cholecalciferol (VITAMIN D3) 2000 units capsule Take 1 capsule (2,000 Units total) by mouth daily. 02/03/17  Yes [name], [name], MD  cyanocobalamin (,VITAMIN B-12,) 1000 MCG/ML injection INJECT 1 ML SUBCUTANEOUSLY EVERY 30 DAYS 02/08/17  Yes [name], [name], MD  dexamethasone (DECADRON) 4 MG tablet Take 1 tablet (4 mg total) by mouth every 12 (twelve) hours. 04/20/17  Yes Eugenie Filler, MD  lacosamide 100 MG TABS Take 1 tablet (100 mg total) by mouth 2 (two) times daily. 04/20/17  Yes Eugenie Filler, MD  levETIRAcetam (KEPPRA) 500 MG tablet Take 1 tablet (500 mg total) by mouth 2 (two) times daily. 04/20/17  Yes Eugenie Filler, MD  LORazepam (ATIVAN) 0.5 MG tablet TAKE 1 TABLET BY MOUTH 3 TIMES A DAY AS NEEDED FOR ANXIETY 03/29/17  Yes [name], [name], MD  magnesium gluconate (MAGONATE) 500 MG tablet Take 1 tablet (500 mg total) by mouth 2 (two) times daily. 02/02/17  Yes [name], [name], MD  metoprolol succinate (TOPROL-XL) 50 MG 24 hr tablet TAKE 1/2 TABLET (25 MG TOTAL) BY MOUTH EVERY DAY 03/29/17  Yes [name], [name], MD  omeprazole (PRILOSEC) 40 MG capsule Take 1 capsule (40 mg total) by mouth daily. 04/20/17  Yes Eugenie Filler, MD  ondansetron (ZOFRAN) 8 MG tablet Take 1 tablet (8 mg total) by mouth every 8 (eight) hours as needed for nausea or vomiting. 04/02/17  Yes Causey, Charlestine Massed, NP  senna (SENOKOT) 8.6 MG TABS tablet Take 1 tablet (8.6 mg total) by mouth 2 (two) times daily. Patient not taking: Reported on 05/20/2017 04/20/17   Eugenie Filler, MD    Inpatient Medications: Scheduled Meds: . lacosamide  100 mg Oral BID  . levETIRAcetam  500 mg Oral BID  . metoprolol succinate  25 mg Oral Daily   Continuous Infusions:  PRN Meds: acetaminophen, ondansetron (ZOFRAN) IV  Allergies:    Allergies  Allergen Reactions  . Penicillins Swelling and Other (See Comments)    Genital  swelling/skin peeled/yeast infection Has patient had a PCN reaction causing immediate rash, facial/tongue/throat swelling, SOB or lightheadedness with hypotension: Yes Has patient had a PCN reaction causing severe rash involving mucus membranes or skin necrosis: Unknown Has patient had a PCN reaction that required hospitalization: Unknown Has patient had a PCN reaction occurring within the last 10 years: Unknown If all of the above answers are "NO", then may proceed with   . Sulfonamide Derivatives Other (See Comments)    Convulsions      Social History:   Social History   Social History  . Marital status: Single    Spouse name: N/A  . Number of children: N/A  . Years of education: N/A   Occupational History  . retired Energy manager   Social History Main Topics  . Smoking status: Never Smoker  . Smokeless tobacco: Never Used  . Alcohol use Yes     Comment: 05/30/2015 "I might take a drink of wine 1-2 times/yr"  . Drug use: No  . Sexual activity: Not Currently   Other Topics Concern  . Not on file   Social History Narrative   Admitted to [facility] 04/22/17    Never married   Never smoked   Alcohol  Occasional wine   Full Code    Family History:    Family History  Problem Relation Age of Onset  . Heart disease Mother        CAD  . COPD Mother   . Heart disease Father 74       leaky valve  . Parkinsonism Father   . Hypertension Other      ROS:  Please see the history of present illness.  ROS  All other ROS reviewed and negative.     Physical Exam/Data:   Vitals:   05/21/17 0027 05/21/17 0442 05/21/17 1054 05/21/17 1534  BP: 137/72 (!) 119/58 120/83 124/71  Pulse: 74 68 79 74  Resp: 18 14  18   Temp: 98 F (36.7 C) 98.1 F (36.7 C)  98 F (36.7 C)  TempSrc:  Rectal  Oral  SpO2: 100% 97%  99%  Weight:  198 lb 14.4 oz (90.2 kg)    Height:        Intake/Output Summary (Last 24 hours) at 05/21/17 1613 Last data filed at 05/21/17 1428  Gross per 24 hour  Intake             1780 ml  Output             1550 ml  Net              230 ml   Filed Weights   05/20/17 2300 05/21/17 0442  Weight: 186 lb 3.2 oz (84.5 kg) 198 lb 14.4 oz (90.2 kg)   Body mass index is 36.38 kg/m.  General:  Chronically ill -appearing plethoric facies , in no acute distress HEENT: normal except hematoma on posterior scalp Lymph: no adenopathy Neck: no JVD Endocrine:  No thryomegaly Vascular: No carotid bruits; FA pulses 2+ bilaterally without bruits  Cardiac:  normal S1, S2; RRR, occ irreg beat; 2/6 SEM LUSB + port-a-cath  Lungs:  clear to auscultation bilaterally, no wheezing, rhonchi or rales  Abd: obese soft, nontender, rounded Ext: warm. LUE edema with lymphedema sleeve. No LE edema  Musculoskeletal:  No deformities, BUE and BLE strength normal and equal Skin: warm and dry  Neuro:  CNs 2-12 intact, no focal abnormalities noted +  tremor LUE  Psych:  Normal affect   EKG:  The EKG was personally reviewed and demonstrates:  Sinus rhythm at 72 bpm (poor tracing, not afib as machine read) no ischemic changes Telemetry:  Telemetry was personally  reviewed and demonstrates:  SR with PAC's  Relevant CV Studies:  Echocardiogram 05/21/17 Study Conclusions - Left ventricle: The cavity size was normal. There was moderate   concentric hypertrophy. Systolic function was moderately to   severely reduced. The estimated ejection fraction was in the   range of 30% to 35%. Severe hypokinesis of the   mid-apicalanteroseptal, anterior, inferior, and apical   myocardium; consistent with ischemia in the distribution of the   left anterior descending coronary artery. There was fusion of   early and atrial contributions to ventricular filling. Doppler   parameters are consistent with restrictive physiology, indicative   of decreased left ventricular diastolic compliance and/or   increased left atrial pressure. - Mitral valve: Calcified annulus. - Left atrium: The atrium was mildly dilated. - Right ventricle: The cavity size was normal. Wall thickness was   increased. Systolic function was mildly reduced. - Atrial septum: No defect or patent foramen ovale was identified.  Impressions: - There are new wall motion abnormalities and marked worsening of   overall LV systolic function since August 2018.   Echo 12/16: EF 60-65% Lat s' 10.3 GLS - 21.1% + aortic sclerosis (no stenosis) Echo 3/17: EF 60% Lat s' 10.7 GLS - 22.2%  Echo 03/15/17: EF 55-60% Lat s' 10.0 cm/s GLS -19.4%  Laboratory Data:  Chemistry Recent Labs Lab 05/20/17 1912  NA 137  K 3.5  CL 99*  CO2 26  GLUCOSE 127*  BUN 25*  CREATININE 1.13*  CALCIUM 8.7*  GFRNONAA 47*  GFRAA 55*  ANIONGAP 12     Recent Labs Lab 05/20/17 1912  PROT 5.8*  ALBUMIN 3.1*  AST 26  ALT 23  ALKPHOS 66  BILITOT 1.0   Hematology Recent Labs Lab 05/20/17 1912  WBC 13.8*  RBC 4.02  HGB 13.7  HCT 40.3  MCV 100.2*  MCH 34.1*  MCHC 34.0  RDW 13.1  PLT 224   Cardiac Enzymes Recent Labs Lab 05/21/17 0348 05/21/17 0629  TROPONINI 2.29* 2.13*    Recent Labs Lab  05/20/17 1837 05/20/17 2119  TROPIPOC 1.75* 1.75*    BNPNo results for input(s): BNP, PROBNP in the last 168 hours.  DDimer No results for input(s): DDIMER in the last 168 hours.  Radiology/Studies:  Ct Head Wo Contrast  Result Date: 05/20/2017 CLINICAL DATA:  71 year old female status post fall with right posterior scalp hematoma. Treated parenchymal and dural brain metastases from breast cancer. EXAM: CT HEAD WITHOUT CONTRAST CT CERVICAL SPINE WITHOUT CONTRAST TECHNIQUE: Multidetector CT imaging of the head and cervical spine was performed following the standard protocol without intravenous contrast. Multiplanar CT image reconstructions of the cervical spine were also generated. COMPARISON:  Brain MRI 04/18/2017 and earlier. Head CT without contrast 04/17/2017. CT cervical spine 07/10/2016. FINDINGS: CT HEAD FINDINGS Brain: Right temporal lobe resection cavity and treatment site appears stable. No intracranial mass effect. Stable and normal gray-white matter differentiation elsewhere. Incidental bulky dural calcification. No midline shift, ventriculomegaly, mass effect, intracranial hemorrhage or evidence of cortically based acute infarction. Vascular: Calcified atherosclerosis at the skull base. Skull: Previous right frontotemporal craniotomy. Chronic hyperostosis of the calvarium, normal variant. No acute osseous abnormality identified. Sinuses/Orbits: Stable. Other: Broad-based right posterior scalp contusion or hematoma (33). No underlying skull fracture. Stable scalp  and orbits soft tissues otherwise. CT CERVICAL SPINE FINDINGS Alignment: Stable cervical lordosis. Stable subtle anterolisthesis of C4 on C5. Cervicothoracic junction alignment is within normal limits. Bilateral posterior element alignment is within normal limits. Skull base and vertebrae: Visualized skull base is intact. No atlanto-occipital dissociation. No cervical spine fracture identified. Possible degenerative developing  posterior element ankylosis at several levels. Soft tissues and spinal canal: No prevertebral fluid or swelling. No visible canal hematoma. Right IJ approach porta cath partially visible. Stable neck soft tissues. Disc levels: Mild for age cervical spine degeneration. Chronic disc and endplate degeneration at C5-C6 with left eccentric disc osteophyte complex in possible mild spinal stenosis. Upper chest: Visible upper thoracic levels appear intact. Negative lung apices. Mediastinal lipomatosis. IMPRESSION: 1. Right scalp hematoma without underlying fracture. 2.  Stable non contrast CT appearance of the brain since September. 3.  No acute fracture or listhesis in the cervical spine. Electronically Signed   By: Genevie Ann M.D.   On: 05/20/2017 21:08   Ct Cervical Spine Wo Contrast  Result Date: 05/20/2017 CLINICAL DATA:  71 year old female status post fall with right posterior scalp hematoma. Treated parenchymal and dural brain metastases from breast cancer. EXAM: CT HEAD WITHOUT CONTRAST CT CERVICAL SPINE WITHOUT CONTRAST TECHNIQUE: Multidetector CT imaging of the head and cervical spine was performed following the standard protocol without intravenous contrast. Multiplanar CT image reconstructions of the cervical spine were also generated. COMPARISON:  Brain MRI 04/18/2017 and earlier. Head CT without contrast 04/17/2017. CT cervical spine 07/10/2016. FINDINGS: CT HEAD FINDINGS Brain: Right temporal lobe resection cavity and treatment site appears stable. No intracranial mass effect. Stable and normal gray-white matter differentiation elsewhere. Incidental bulky dural calcification. No midline shift, ventriculomegaly, mass effect, intracranial hemorrhage or evidence of cortically based acute infarction. Vascular: Calcified atherosclerosis at the skull base. Skull: Previous right frontotemporal craniotomy. Chronic hyperostosis of the calvarium, normal variant. No acute osseous abnormality identified.  Sinuses/Orbits: Stable. Other: Broad-based right posterior scalp contusion or hematoma (33). No underlying skull fracture. Stable scalp and orbits soft tissues otherwise. CT CERVICAL SPINE FINDINGS Alignment: Stable cervical lordosis. Stable subtle anterolisthesis of C4 on C5. Cervicothoracic junction alignment is within normal limits. Bilateral posterior element alignment is within normal limits. Skull base and vertebrae: Visualized skull base is intact. No atlanto-occipital dissociation. No cervical spine fracture identified. Possible degenerative developing posterior element ankylosis at several levels. Soft tissues and spinal canal: No prevertebral fluid or swelling. No visible canal hematoma. Right IJ approach porta cath partially visible. Stable neck soft tissues. Disc levels: Mild for age cervical spine degeneration. Chronic disc and endplate degeneration at C5-C6 with left eccentric disc osteophyte complex in possible mild spinal stenosis. Upper chest: Visible upper thoracic levels appear intact. Negative lung apices. Mediastinal lipomatosis. IMPRESSION: 1. Right scalp hematoma without underlying fracture. 2.  Stable non contrast CT appearance of the brain since September. 3.  No acute fracture or listhesis in the cervical spine. Electronically Signed   By: Genevie Ann M.D.   On: 05/20/2017 21:08   Dg Pelvis Portable  Result Date: 05/20/2017 CLINICAL DATA:  72 year old female status post fall. EXAM: PORTABLE PELVIS 1-2 VIEWS COMPARISON:  07/09/2016. FINDINGS: Portable AP view at 1942 hours. Chronic dystrophic calcification about the greater trochanters, more so the left. Femoral heads are normally located. Hip joint spaces appear stable. Proximal femurs appear grossly intact. Pelvis appears stable and intact. SI joints appear within normal limits. Negative visible bowel gas pattern. IMPRESSION: No acute fracture or dislocation identified about the  pelvis. If there is lateralizing hip pain, then a dedicated  hip series is recommended. Electronically Signed   By: Genevie Ann M.D.   On: 05/20/2017 20:09   Dg Chest Portable 1 View  Result Date: 05/20/2017 CLINICAL DATA:  Post fall. EXAM: PORTABLE CHEST 1 VIEW COMPARISON:  07/12/2016 FINDINGS: Right IJ Port-A-Cath unchanged. Lungs are adequately inflated and otherwise clear. Borderline stable cardiomegaly. Calcified plaque over the thoracic aorta. Diffuse osteopenia. Mild degenerate change of the spine. Surgical clips of the left axilla. IMPRESSION: No acute cardiopulmonary disease. Aortic Atherosclerosis (ICD10-I70.0). Electronically Signed   By: Marin Olp M.D.   On: 05/20/2017 20:08   Dg Tibia/fibula Right Port  Result Date: 05/20/2017 CLINICAL DATA:  71 year old female status post fall with midshaft right tibia bruise. EXAM: PORTABLE RIGHT TIBIA AND FIBULA - 2 VIEW COMPARISON:  None. FINDINGS: There is no evidence of fracture or other focal bone lesions. Degenerative changes at the right knee and ankle. Calcified peripheral vascular disease. Mild soft tissue swelling anterior to the tibia. IMPRESSION: No acute fracture or dislocation identified about the right tib-fib. Electronically Signed   By: Genevie Ann M.D.   On: 05/20/2017 20:08    Assessment and Plan:    1. NSTEMI: Pt presented after a fall. Denies chest pain or shortness of breath or loss of consciousness. Troponins elevated with peak of 2.29 then 2.13. Newly decreased EF to 30-35% with new wall motion abnormalities. Not on aspirin or heparin at present due to head injury and brain mets. Possibly stress induced cardiomyopathy in setting of a fall that caused her pretty significant emotional distress. Ideally would like to get cardiac cath to evaluate, but pt not symptomatic.  I will discuss need for further cardiac evaluation with Dr. Haroldine Laws.  2. Breast cancer with Mets to the brain. Followed in cardio-oncology clinic by Dr. Mahalia Longest. She initially completed chemo and Herceptin in 2016.  Unfortunately found to have relapse with brain mets in 12/17. Underwent craniotomy for resection and XRT. Started on lapatanib but recently stopped due to diarrhea.  3. HTN: BP stable 4. Leukocytosis: WBCs 13.8, could be reactive to the stress of her fall or decadron (has been on this for awhile without increased WBC). U/A shows 6-30 WBC, rare bacteria. Pt had extreme urgency to void while I was in the room, denies dysuria. UTI could have contributed to her fall and family's report of confusion. Urine culture pending. Pt had UTI in early September with e. coli on culture.  5. Acute kidney injury: Mild with SCr 1.13 likely related to poor oral intake. Creatinine baseline 0.9-1.1 6. Seizure disorder: pt on decadron for brain mets. On Keppra and vimpat. Pt is adamant that this fall was not related to a seizure.    For questions or updates, please contact [place] Please consult [email or web address] for contact info under Cardiology/STEMI.   Signed, Daune Perch, NP  05/21/2017 4:13 PM   Patient seen and examined with the above-signed Advanced Practice Provider and/or Housestaff. I personally reviewed laboratory data, imaging studies and relevant notes. I independently examined the patient and formulated the important aspects of the plan. I have edited the note to reflect any of my changes or salient points. I have personally discussed the plan with the patient and/or family.  Savannah Howard is known to me from Cardi-oncology Clinic. 71 y/o with recurrent breast CA with brain mets. I saw her in the Clinic in 8/18 and Echo at that time was normal. Recent discharged from rehab and  now living in a motel. She states she was trying to take the garbage out when she fell and hit her head. She is adamant that she didn't lose consciousness. However her HCPOA, [name], who is at the bedside with her says it is unclear how long she was on the floor before she was found.   Brought to ER and troponin elevated at 2.3  (follow-up 2.1). ECG poor quality but appears to have new Q waves in v2-v3. Echo EF 30-35% with antero-apical WMAs suggestive of anterior MI. She denies any symptoms of angina or abdominal upset in last few weeks,.  Currently BP stable. No evidence of acute HF.   Differential currently is silent MI vs possible stress-induced CM (Tako-Tsubo). Flat low-level troponins support possible Tako-tsubo or more remote MI but ECG very concerning for ischemic event and I actually favor the fact that she had an out-of-hospital MI. Her fall could be related to arrhythmia but no evidence of VT currently on monitor.   Given her comorbidities (particualrly brian mets), I do not think she is a candidate for cath and have recommended supportive care with HF meds and ASA. She is in agreement. I also had long talk about her prognosis and need for more help and she has agreed to a [place] consult with Palliative care which I will place.    Glori Bickers, MD  6:40 PM

## 2017-05-21 NOTE — Progress Notes (Signed)
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  Savannah Howard stated she was in her normal state of health when she tripped over a pile of her adult depends on the floor and fell.  She denied any loss of consciousness or preceding chest pain, palpitations, dizziness, changes in vision.  Family reported they found Savannah Howard on floor for unspecified amount of time Of note Savannah Howard was last admitted in September for new partial seizures in setting of brain metastasis.  She denied any similar symptoms on this presentation ( which she specified as no numbness in extremities or facial twitching) .  This morning she states that she is doing well and continues to deny any chest pain or palpitations. Her appetite is normal. No abdominal pain or shortness of breath  Assessment/Plan:   NSTEMI On admission troponin of 1.75 in asymptomatic Savannah Howard with no ischemic changes; however, TTE showed severely reduced EF with concomitant severe hypokinesis consistent with ischemia in the distribution of the left anterior descending coronary artery.  Savannah Howard has not received any anticoagulation given right scalp hematoma in context of her fall.  Savannah Howard does have metastatic brain lesions however these are reported as stable based off MRI imaging in August. Given concerns from oncologist about medication adherence I would be hesitant to consider cath that may require stent and necessity of dual antiplatelet agents.  - f/u cardiology recommendations to determine noninvasive vs invasive treatment  Unwitnessed Mechanical ground level Fall, unknown  etiology Altered Mental Status, resolved Right Scalp hematoma Unclear etiology for fall though long differential including pharmacologic (ativan), orthostatic hypotension ( no orthostatic vitals obtained though BP on admission has been stable on metoprolol), functional fall ( generalized weakness as Savannah Howard has required physical therapy before and recently discharged from SNF), seizure ( unwitnessed fall so unclear if any seizure-like activity, additionally has been nonadherent to keppra at home), or related to NSTEMI.  Speaking with friend [name] states that she has been confused even prior to this recent fall - Social Work consult given living conditions - f/u w/ Cardiology as mentioned above - hold home ativan  Left ER/HER + Breast cancer metastasized to brain s/p neoadjuvant chemotherapy, stable Metastatic brain lesions (right temporal dx'd 06/2016) stable on recent MRI imaging in 04/2017. Currently receiving treatment with Herceptin with Faslodex - continue to monitor and assure follow up on outpatient   Seizure disorder, stable Related to metastatic brain lesions.  Reports of medication non-adherence per friend who manages her pillbox -continue on home keppra   Code Status: FULL  Family Communication: Spoke with friend/emergency contact Karis Juba.  Savannah Howard chooses to live in a motel for 15 years.  She found her down for unknown amount of time at home.  She was updated on the plan for today  Disposition Plan: pending cardiology recs given new wall motion abnormalities on echo   Consultants:  Cardiology  Procedures:  TTE 05/21/17 EF 30-35% (markedly worsened from LV systolic function in August 2018) Severe hypokinesis of mid-apicalanteroseptal, anterior, inferior, and apical myocardium; consistent with ischemia in the distribution of the left anterior descending coronary artery  Antimicrobials:  None  DVT prophylaxis:  Mechanical ( SCDs)  Objective: Vitals:    05/21/17 0027 05/21/17 0442 05/21/17 1054 05/21/17 1534  BP: 137/72 (!) 119/58 120/83 124/71  Pulse: 74 68 79 74  Resp: 18 14  18   Temp: 98 F (36.7 C) 98.1 F (36.7 C)  98 F (36.7 C)  TempSrc:  Rectal  Oral  SpO2: 100% 97%  99%  Weight:  90.2 kg (198 lb 14.4 oz)    Height:        Intake/Output Summary (Last 24 hours) at 05/21/17 1548 Last data filed at 05/21/17 1428  Gross per 24 hour  Intake             1780 ml  Output             1550 ml  Net              230 ml   Filed Weights   05/20/17 2300 05/21/17 0442  Weight: 84.5 kg (186 lb 3.2 oz) 90.2 kg (198 lb 14.4 oz)    Exam:   General:  Sitting in bed comfortably in no distress  Cardiovascular: regular rate and rhythm with no murmurs rubs or gallops. No appreciable JVD or peripheral edema   Respiratory: clear to auscultation no anterior chest fields, on room air  Abdomen: soft, non-distended, non-tender   Neurologic: Oriented to person, place, situation. 3/5 strength in bilateral lower extremities. 4/5 in bilateral upper extremities. Symmetric facies   Skin: normal skin turgor  Psychiatry: normal affect and mood.     Data Reviewed: CBC:  Recent Labs Lab 05/20/17 1912  WBC 13.8*  NEUTROABS 10.7*  HGB 13.7  HCT 40.3  MCV 100.2*  PLT 737   Basic Metabolic Panel:  Recent Labs Lab 05/20/17 1912  NA 137  K 3.5  CL 99*  CO2 26  GLUCOSE 127*  BUN 25*  CREATININE 1.13*  CALCIUM 8.7*   GFR: Estimated Creatinine Clearance: 47 mL/min (A) (by C-G formula based on SCr of 1.13 mg/dL (H)). Liver Function Tests:  Recent Labs Lab 05/20/17 1912  AST 26  ALT 23  ALKPHOS 66  BILITOT 1.0  PROT 5.8*  ALBUMIN 3.1*   No results for input(s): LIPASE, AMYLASE in the last 168 hours. No results for input(s): AMMONIA in the last 168 hours. Coagulation Profile: No results for input(s): INR, PROTIME in the last 168 hours. Cardiac Enzymes:  Recent Labs Lab 05/20/17 1912 05/21/17 0348 05/21/17 0629   CKTOTAL 81  --   --   TROPONINI  --  2.29* 2.13*   BNP (last 3 results) No results for input(s): PROBNP in the last 8760 hours. HbA1C: No results for input(s): HGBA1C in the last 72 hours. CBG: No results for input(s): GLUCAP in the last 168 hours. Lipid Profile: No results for input(s): CHOL, HDL, LDLCALC, TRIG, CHOLHDL, LDLDIRECT in the last 72 hours. Thyroid Function Tests: No results for input(s): TSH, T4TOTAL, FREET4, T3FREE, THYROIDAB in the last 72 hours. Anemia Panel: No results for input(s): VITAMINB12, FOLATE, FERRITIN, TIBC, IRON, RETICCTPCT in the last 72 hours. Urine analysis:    Component Value Date/Time   COLORURINE YELLOW 05/21/2017 0136   APPEARANCEUR HAZY (A) 05/21/2017 0136   LABSPEC 1.015 05/21/2017 0136   PHURINE 6.0 05/21/2017 0136   GLUCOSEU NEGATIVE 05/21/2017 0136   GLUCOSEU NEGATIVE 11/05/2014 1456   HGBUR NEGATIVE 05/21/2017 0136   BILIRUBINUR NEGATIVE 05/21/2017 0136   KETONESUR NEGATIVE 05/21/2017 0136   PROTEINUR NEGATIVE 05/21/2017 0136   UROBILINOGEN 0.2 11/05/2014 1456   NITRITE NEGATIVE  05/21/2017 0136   LEUKOCYTESUR NEGATIVE 05/21/2017 0136   Sepsis Labs: @LABRCNTIP (procalcitonin:4,lacticidven:4)  )No results found for this or any previous visit (from the past 240 hour(s)).    Studies: Ct Head Wo Contrast  Result Date: 05/20/2017 CLINICAL DATA:  71 year old female status post fall with right posterior scalp hematoma. Treated parenchymal and dural brain metastases from breast cancer. EXAM: CT HEAD WITHOUT CONTRAST CT CERVICAL SPINE WITHOUT CONTRAST TECHNIQUE: Multidetector CT imaging of the head and cervical spine was performed following the standard protocol without intravenous contrast. Multiplanar CT image reconstructions of the cervical spine were also generated. COMPARISON:  Brain MRI 04/18/2017 and earlier. Head CT without contrast 04/17/2017. CT cervical spine 07/10/2016. FINDINGS: CT HEAD FINDINGS Brain: Right temporal lobe  resection cavity and treatment site appears stable. No intracranial mass effect. Stable and normal gray-white matter differentiation elsewhere. Incidental bulky dural calcification. No midline shift, ventriculomegaly, mass effect, intracranial hemorrhage or evidence of cortically based acute infarction. Vascular: Calcified atherosclerosis at the skull base. Skull: Previous right frontotemporal craniotomy. Chronic hyperostosis of the calvarium, normal variant. No acute osseous abnormality identified. Sinuses/Orbits: Stable. Other: Broad-based right posterior scalp contusion or hematoma (33). No underlying skull fracture. Stable scalp and orbits soft tissues otherwise. CT CERVICAL SPINE FINDINGS Alignment: Stable cervical lordosis. Stable subtle anterolisthesis of C4 on C5. Cervicothoracic junction alignment is within normal limits. Bilateral posterior element alignment is within normal limits. Skull base and vertebrae: Visualized skull base is intact. No atlanto-occipital dissociation. No cervical spine fracture identified. Possible degenerative developing posterior element ankylosis at several levels. Soft tissues and spinal canal: No prevertebral fluid or swelling. No visible canal hematoma. Right IJ approach porta cath partially visible. Stable neck soft tissues. Disc levels: Mild for age cervical spine degeneration. Chronic disc and endplate degeneration at C5-C6 with left eccentric disc osteophyte complex in possible mild spinal stenosis. Upper chest: Visible upper thoracic levels appear intact. Negative lung apices. Mediastinal lipomatosis. IMPRESSION: 1. Right scalp hematoma without underlying fracture. 2.  Stable non contrast CT appearance of the brain since September. 3.  No acute fracture or listhesis in the cervical spine. Electronically Signed   By: Genevie Ann M.D.   On: 05/20/2017 21:08   Ct Cervical Spine Wo Contrast  Result Date: 05/20/2017 CLINICAL DATA:  71 year old female status post fall with  right posterior scalp hematoma. Treated parenchymal and dural brain metastases from breast cancer. EXAM: CT HEAD WITHOUT CONTRAST CT CERVICAL SPINE WITHOUT CONTRAST TECHNIQUE: Multidetector CT imaging of the head and cervical spine was performed following the standard protocol without intravenous contrast. Multiplanar CT image reconstructions of the cervical spine were also generated. COMPARISON:  Brain MRI 04/18/2017 and earlier. Head CT without contrast 04/17/2017. CT cervical spine 07/10/2016. FINDINGS: CT HEAD FINDINGS Brain: Right temporal lobe resection cavity and treatment site appears stable. No intracranial mass effect. Stable and normal gray-white matter differentiation elsewhere. Incidental bulky dural calcification. No midline shift, ventriculomegaly, mass effect, intracranial hemorrhage or evidence of cortically based acute infarction. Vascular: Calcified atherosclerosis at the skull base. Skull: Previous right frontotemporal craniotomy. Chronic hyperostosis of the calvarium, normal variant. No acute osseous abnormality identified. Sinuses/Orbits: Stable. Other: Broad-based right posterior scalp contusion or hematoma (33). No underlying skull fracture. Stable scalp and orbits soft tissues otherwise. CT CERVICAL SPINE FINDINGS Alignment: Stable cervical lordosis. Stable subtle anterolisthesis of C4 on C5. Cervicothoracic junction alignment is within normal limits. Bilateral posterior element alignment is within normal limits. Skull base and vertebrae: Visualized skull base is intact. No atlanto-occipital dissociation. No cervical  spine fracture identified. Possible degenerative developing posterior element ankylosis at several levels. Soft tissues and spinal canal: No prevertebral fluid or swelling. No visible canal hematoma. Right IJ approach porta cath partially visible. Stable neck soft tissues. Disc levels: Mild for age cervical spine degeneration. Chronic disc and endplate degeneration at C5-C6 with  left eccentric disc osteophyte complex in possible mild spinal stenosis. Upper chest: Visible upper thoracic levels appear intact. Negative lung apices. Mediastinal lipomatosis. IMPRESSION: 1. Right scalp hematoma without underlying fracture. 2.  Stable non contrast CT appearance of the brain since September. 3.  No acute fracture or listhesis in the cervical spine. Electronically Signed   By: Genevie Ann M.D.   On: 05/20/2017 21:08   Dg Pelvis Portable  Result Date: 05/20/2017 CLINICAL DATA:  71 year old female status post fall. EXAM: PORTABLE PELVIS 1-2 VIEWS COMPARISON:  07/09/2016. FINDINGS: Portable AP view at 1942 hours. Chronic dystrophic calcification about the greater trochanters, more so the left. Femoral heads are normally located. Hip joint spaces appear stable. Proximal femurs appear grossly intact. Pelvis appears stable and intact. SI joints appear within normal limits. Negative visible bowel gas pattern. IMPRESSION: No acute fracture or dislocation identified about the pelvis. If there is lateralizing hip pain, then a dedicated hip series is recommended. Electronically Signed   By: Genevie Ann M.D.   On: 05/20/2017 20:09   Dg Chest Portable 1 View  Result Date: 05/20/2017 CLINICAL DATA:  Post fall. EXAM: PORTABLE CHEST 1 VIEW COMPARISON:  07/12/2016 FINDINGS: Right IJ Port-A-Cath unchanged. Lungs are adequately inflated and otherwise clear. Borderline stable cardiomegaly. Calcified plaque over the thoracic aorta. Diffuse osteopenia. Mild degenerate change of the spine. Surgical clips of the left axilla. IMPRESSION: No acute cardiopulmonary disease. Aortic Atherosclerosis (ICD10-I70.0). Electronically Signed   By: Marin Olp M.D.   On: 05/20/2017 20:08   Dg Tibia/fibula Right Port  Result Date: 05/20/2017 CLINICAL DATA:  72 year old female status post fall with midshaft right tibia bruise. EXAM: PORTABLE RIGHT TIBIA AND FIBULA - 2 VIEW COMPARISON:  None. FINDINGS: There is no evidence of  fracture or other focal bone lesions. Degenerative changes at the right knee and ankle. Calcified peripheral vascular disease. Mild soft tissue swelling anterior to the tibia. IMPRESSION: No acute fracture or dislocation identified about the right tib-fib. Electronically Signed   By: Genevie Ann M.D.   On: 05/20/2017 20:08    Scheduled Meds: . lacosamide  100 mg Oral BID  . levETIRAcetam  500 mg Oral BID  . metoprolol succinate  25 mg Oral Daily    Continuous Infusions:   LOS: 0 days     Desiree Hane, MD [facility]   If 7PM-7AM, please contact night-coverage [email or web address] Password [facility] 05/21/2017, 3:48 PM

## 2017-05-21 NOTE — Care Management Note (Signed)
Case Management Note  Patient Details  Name: ABBEGAYLE DENAULT MRN: [id number] Date of Birth: Nov 19, 1945  Subjective/Objective:                 Patient from home, lives alone. In obs for fall. Patient states she has a caregiver that comes most nights to visit, who does laundry, gets groceries, makes some meals, does some cleaning. Patient has friend who picks up her medications. Patient has a cane, walker, would like a shower seat at DC. Patient states that she was at [facility] about a year ago, and had [employer] after that but cannot remember name of company.    Action/Plan:  CM will continue to follow for DC planning.  Expected Discharge Date:  05/23/17               Expected Discharge Plan:  [place]  In-House Referral:     Discharge planning Services  CM Consult  Post Acute Care Choice:    Choice offered to:     DME Arranged:    DME Agency:     HH Arranged:    [place] Agency:     Status of Service:  In process, will continue to follow  If discussed at Long Length of Stay Meetings, dates discussed:    Additional Comments:  Carles Collet, RN 05/21/2017, 9:11 AM

## 2017-05-21 NOTE — Progress Notes (Signed)
  Echocardiogram 2D Echocardiogram has been performed.  Darlina Sicilian M 05/21/2017, 11:56 AM

## 2017-05-21 NOTE — Care Management Obs Status (Signed)
[facility] NOTIFICATION   Patient Details  Name: Savannah Howard MRN: [id number] Date of Birth: 1945-12-14   Medicare Observation Status Notification Given:  Yes    Carles Collet, RN 05/21/2017, 9:11 AM

## 2017-05-22 DIAGNOSIS — C50912 Malignant neoplasm of unspecified site of left female breast: Secondary | ICD-10-CM

## 2017-05-22 DIAGNOSIS — Z515 Encounter for palliative care: Secondary | ICD-10-CM

## 2017-05-22 DIAGNOSIS — G40909 Epilepsy, unspecified, not intractable, without status epilepticus: Secondary | ICD-10-CM

## 2017-05-22 MED ORDER — LORAZEPAM 0.5 MG PO TABS
0.5000 mg | ORAL_TABLET | Freq: Three times a day (TID) | ORAL | Status: DC | PRN
  Administered 2017-05-25: 0.5 mg via ORAL
  Filled 2017-05-22: qty 1

## 2017-05-22 MED ORDER — DEXTROSE 5 % IV SOLN
1.0000 g | INTRAVENOUS | Status: DC
  Administered 2017-05-22 – 2017-05-24 (×3): 1 g via INTRAVENOUS
  Filled 2017-05-22 (×3): qty 10

## 2017-05-22 NOTE — Progress Notes (Addendum)
PROGRESS NOTE  Savannah Howard DXI:[id number] DOB: [birth year] DOA: 05/20/2017 PCP: [name], MD  HPI/Recap of past 24 hours: 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  Patient stated she was in her normal state of health when she tripped over a pile of her adult depends on the floor and fell.  She denied any loss of consciousness or preceding chest pain, palpitations, dizziness, changes in vision.  Family reported they found patient on floor for unspecified amount of time Of note patient was last admitted in September for new partial seizures in setting of brain metastasis.  She denied any similar symptoms on this presentation ( which she specified as no numbness in extremities or facial twitching) .  This morning she states that she is doing well and continues to deny any chest pain or palpitations. Her appetite is normal. No abdominal pain or shortness of breath  Assessment/Plan:   NSTEMI -On admission troponin of 1.75 in asymptomatic patient with no ischemic changes; however, TTE showed severely reduced EF with concomitant severe hypokinesis consistent with ischemia in the distribution of the left anterior descending coronary artery.  - Patient has not received any anticoagulation given right scalp hematoma in context of her fall.  Patient does have metastatic brain lesions however these are reported as stable based off MRI imaging in August. -Given concerns from oncologist about medication adherence I would be hesitant to consider cath that may require stent and necessity of dual antiplatelet agents.  - f/u cardiology recommendations to determine noninvasive vs invasive treatment -ECHO with lower EF than previous, EF now at 35 %.  -will  follow cardiology recommendations.    Unwitnessed Mechanical ground level Fall, unknown etiology Altered Mental Status, resolved Right Scalp hematoma Unclear etiology, could be related to  NSTEMI vs medications related. . - Social Work consult given living conditions. Needs more long term placement.  -resume home dose ativan, PRN.  PT consult.   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 Currently receiving treatment with Herceptin with Faslodex - continue to monitor and assure follow up on outpatient   Seizure disorder, stable Related to metastatic brain lesions.  Reports of medication non-adherence per friend who manages her pillbox -continue on home keppra  -per POA, Patient has not been taking medications as she should.   UTI; UA growing E coli. Start ceftriaxone.   Code Status: FULL  Family Communication: Spoke with friend/emergency contact Karis Juba. POA  Disposition Plan: pending cardiology recs given new wall motion abnormalities on echo   Consultants:  Cardiology  Procedures:  TTE 05/21/17 EF 30-35% (markedly worsened from LV systolic function in August 2018) Severe hypokinesis of mid-apicalanteroseptal, anterior, inferior, and apical myocardium; consistent with ischemia in the distribution of the left anterior descending coronary artery  Antimicrobials:  None  DVT prophylaxis:  Mechanical ( SCDs)   Objective: Vitals:   05/22/17 0747 05/22/17 0818 05/22/17 1321 05/22/17 1657  BP: (!) 142/90 (!) 142/90 (!) 148/77 130/70  Pulse: 69 69 76 89  Resp: 18  16   Temp: 97.9 F (36.6 C)  97.7 F (36.5 C)   TempSrc: Oral  Oral   SpO2:   100%  Weight:      Height:        Intake/Output Summary (Last 24 hours) at 05/22/17 1710 Last data filed at 05/22/17 1335  Gross per 24 hour  Intake             1200 ml  Output              1000 ml  Net              200 ml   Filed Weights   05/20/17 2300 05/21/17 0442 05/22/17 0418  Weight: 84.5 kg (186 lb 3.2 oz) 90.2 kg (198 lb 14.4 oz) 89.5 kg (197 lb 4.8 oz)    Exam:   General: NAD  Cardiovascular: S 1, S 2 RRR  Respiratory: CTA  Abdomen: Soft, nt, nd  Neurologic: alert, oriented to place.   Skin: normal skin turgor    Data Reviewed: CBC:  Recent Labs Lab 05/20/17 1912  WBC 13.8*  NEUTROABS 10.7*  HGB 13.7  HCT 40.3  MCV 100.2*  PLT 701   Basic Metabolic Panel:  Recent Labs Lab 05/20/17 1912  NA 137  K 3.5  CL 99*  CO2 26  GLUCOSE 127*  BUN 25*  CREATININE 1.13*  CALCIUM 8.7*   GFR: Estimated Creatinine Clearance: 46.8 mL/min (A) (by C-G formula based on SCr of 1.13 mg/dL (H)). Liver Function Tests:  Recent Labs Lab 05/20/17 1912  AST 26  ALT 23  ALKPHOS 66  BILITOT 1.0  PROT 5.8*  ALBUMIN 3.1*   No results for input(s): LIPASE, AMYLASE in the last 168 hours. No results for input(s): AMMONIA in the last 168 hours. Coagulation Profile: No results for input(s): INR, PROTIME in the last 168 hours. Cardiac Enzymes:  Recent Labs Lab 05/20/17 1912 05/21/17 0348 05/21/17 0629  CKTOTAL 81  --   --   TROPONINI  --  2.29* 2.13*   BNP (last 3 results) No results for input(s): PROBNP in the last 8760 hours. HbA1C: No results for input(s): HGBA1C in the last 72 hours. CBG: No results for input(s): GLUCAP in the last 168 hours. Lipid Profile: No results for input(s): CHOL, HDL, LDLCALC, TRIG, CHOLHDL, LDLDIRECT in the last 72 hours. Thyroid Function Tests: No results for input(s): TSH, T4TOTAL, FREET4, T3FREE, THYROIDAB in the last 72 hours. Anemia Panel: No results for input(s): VITAMINB12, FOLATE, FERRITIN, TIBC, IRON, RETICCTPCT in the last 72 hours. Urine analysis:    Component Value Date/Time   COLORURINE YELLOW 05/21/2017 0136   APPEARANCEUR HAZY (A) 05/21/2017 0136   LABSPEC 1.015 05/21/2017 0136    PHURINE 6.0 05/21/2017 0136   GLUCOSEU NEGATIVE 05/21/2017 0136   GLUCOSEU NEGATIVE 11/05/2014 1456   HGBUR NEGATIVE 05/21/2017 0136   BILIRUBINUR NEGATIVE 05/21/2017 0136   KETONESUR NEGATIVE 05/21/2017 0136   PROTEINUR NEGATIVE 05/21/2017 0136   UROBILINOGEN 0.2 11/05/2014 1456   NITRITE NEGATIVE 05/21/2017 0136   LEUKOCYTESUR NEGATIVE 05/21/2017 0136   Sepsis Labs: @LABRCNTIP (procalcitonin:4,lacticidven:4)  ) Recent Results (from the past 240 hour(s))  Culture, Urine     Status: Abnormal (Preliminary result)   Collection Time: 05/21/17  1:36 AM  Result Value Ref Range Status   Specimen Description URINE, RANDOM  Final   Special Requests NONE  Final   Culture >=100,000 COLONIES/mL ESCHERICHIA COLI (A)  Final   Report Status PENDING  Incomplete      Studies: No results found.  Scheduled Meds: . aspirin EC  81 mg Oral Daily  . atorvastatin  20 mg Oral  q1800  . carvedilol  3.125 mg Oral BID WC  . lacosamide  100 mg Oral BID  . levETIRAcetam  500 mg Oral BID  . spironolactone  12.5 mg Oral Daily    Continuous Infusions: . cefTRIAXone (ROCEPHIN)  IV 1 g (05/22/17 1327)     LOS: 1 day     Elmarie Shiley, MD [facility] [phone]   If 7PM-7AM, please contact night-coverage [email or web address] Password [email or web address] 05/22/2017, 5:10 PM

## 2017-05-22 NOTE — Progress Notes (Signed)
Pharmacy Antibiotic Note  Savannah Howard is a 71 y.o. female admitted on 05/20/2017 with UTI.  Pharmacy has been consulted for ceftriaxone dosing.   PMH significant for breast cancer with brain mets s/p neoadjuvant chemotherapy and radiation. Per IM notes patient has recently been experiencing altered mental status with confusion. Patient is currently afebrile with mildly elevated WBC of 13.8 and urine culture positive for >100K gram negative rods.   Of note, patient has documented allergy to penicillin ("Genital swelling/skin peeled/yeast infection"). Upon questioning patient does not recall allergic reaction to any antibiotic other than sulfa and denies experiencing symptoms of anaphylaxis with any antibiotic. She does not know if she has ever taken a cephalosporin. Per chart review cefazolin and ceftriaxone have been ordered in the past but were not given. Ceftriaxone to be cautiously initiated.  Plan: Ceftriaxone 1 gram IV q 24 hrs Monitor for s/sx hypersensitivity reaction Follow up clinical status, culture results, and de-escalation plan   Height: 5\' 2"  (157.5 cm) Weight: 197 lb 4.8 oz (89.5 kg) IBW/kg (Calculated) : 50.1  Temp (24hrs), Avg:97.9 F (36.6 C), Min:97.7 F (36.5 C), Max:98 F (36.7 C)   Recent Labs Lab 05/20/17 1912  WBC 13.8*  CREATININE 1.13*    Estimated Creatinine Clearance: 46.8 mL/min (A) (by C-G formula based on SCr of 1.13 mg/dL (H)).    Allergies  Allergen Reactions  . Penicillins Swelling and Other (See Comments)    Genital swelling/skin peeled/yeast infection Has patient had a PCN reaction causing immediate rash, facial/tongue/throat swelling, SOB or lightheadedness with hypotension: Yes Has patient had a PCN reaction causing severe rash involving mucus membranes or skin necrosis: Unknown Has patient had a PCN reaction that required hospitalization: Unknown Has patient had a PCN reaction occurring within the last 10 years: Unknown If all of the  above answers are "NO", then may proceed with   . Sulfonamide Derivatives Other (See Comments)    Convulsions      Antimicrobials this admission: none  Microbiology results: 10/12 Urine cx: >100K gram negative rods   Thank you for allowing pharmacy to be a part of this patient's care.   Charlene Brooke, PharmD PGY1 Pharmacy Resident Phone: [phone] After 3:30PM please call [place] [phone] 05/22/2017 11:58 AM

## 2017-05-22 NOTE — Plan of Care (Signed)
Problem: Safety: Goal: Ability to remain free from injury will improve Outcome: Progressing Patient with call bell within reach, bed in low and locked position, bed alarm on throughout evening. Patient with slight confusion upon waking abruptly from sleep to use the restroom. Required reeducation on use of call bell. Patient agreeable to hospital system. Will continue to utilize fall prevention precautions.   Problem: Health Behavior/Discharge Planning: Goal: Ability to manage health-related needs will improve Outcome: Progressing patient distraught throughout evening about receiving news of palliative consult and "heart problems". Patient comforted by this nurse throughout evening with active listening and conversation.
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
## 2017-05-23 DIAGNOSIS — R410 Disorientation, unspecified: Secondary | ICD-10-CM

## 2017-05-23 DIAGNOSIS — C7931 Secondary malignant neoplasm of brain: Secondary | ICD-10-CM

## 2017-05-23 DIAGNOSIS — N183 Chronic kidney disease, stage 3 (moderate): Secondary | ICD-10-CM

## 2017-05-23 DIAGNOSIS — N170 Acute kidney failure with tubular necrosis: Secondary | ICD-10-CM

## 2017-05-23 DIAGNOSIS — C50412 Malignant neoplasm of upper-outer quadrant of left female breast: Secondary | ICD-10-CM

## 2017-05-23 LAB — CBC
HEMATOCRIT: 37.2 % (ref 36.0–46.0)
HEMOGLOBIN: 12 g/dL (ref 12.0–15.0)
MCH: 33.1 pg (ref 26.0–34.0)
MCHC: 32.3 g/dL (ref 30.0–36.0)
MCV: 102.5 fL — ABNORMAL HIGH (ref 78.0–100.0)
Platelets: 140 10*3/uL — ABNORMAL LOW (ref 150–400)
RBC: 3.63 MIL/uL — ABNORMAL LOW (ref 3.87–5.11)
RDW: 13.7 % (ref 11.5–15.5)
WBC: 8.1 10*3/uL (ref 4.0–10.5)

## 2017-05-23 LAB — BASIC METABOLIC PANEL
Anion gap: 9 (ref 5–15)
BUN: 21 mg/dL — AB (ref 6–20)
CALCIUM: 8.1 mg/dL — AB (ref 8.9–10.3)
CHLORIDE: 102 mmol/L (ref 101–111)
CO2: 25 mmol/L (ref 22–32)
CREATININE: 0.91 mg/dL (ref 0.44–1.00)
GFR calc Af Amer: >60 mL/min (ref >60)
GFR calc non Af Amer: >60 mL/min (ref >60)
GLUCOSE: 124 mg/dL — AB (ref 65–99)
Potassium: 3.4 mmol/L — ABNORMAL LOW (ref 3.5–5.1)
Sodium: 136 mmol/L (ref 135–145)

## 2017-05-23 LAB — URINE CULTURE

## 2017-05-23 MED ORDER — POTASSIUM CHLORIDE CRYS ER 20 MEQ PO TBCR
40.0000 meq | EXTENDED_RELEASE_TABLET | Freq: Once | ORAL | Status: AC
  Administered 2017-05-23: 40 meq via ORAL
  Filled 2017-05-23: qty 2

## 2017-05-23 NOTE — Evaluation (Signed)
Occupational Therapy Evaluation Patient Details Name: Savannah Howard MRN: [id number] DOB: [birth year] Today's Date: 05/23/2017    History of Present Illness Mrs. 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  Patient stated she was in her normal state of health when she tripped over a pile of her adult depends on the floor and fell. Troponins elevated, and pt diagnosed with NSTEMI   Clinical Impression   Pt reports she was managing BADL at mod I level PTA. Currently pt requires mod assist for functional mobility, mod assist for LB ADL, and min assist for UB ADL. Pt presenting with generalized weakness, impaired cognition, hx of falls, poor safety awareness, and poor balance impacting her independence and safety with ADL and functional mobility. Highly recommending SNF at this time to maximize independence and safety with ADL and functional mobility. Pt would benefit from continued skilled OT to address established goals.    Follow Up Recommendations  SNF;Supervision/Assistance - 24 hour    Equipment Recommendations  None recommended by OT    Recommendations for Other Services PT consult     Precautions / Restrictions Precautions Precautions: Fall Restrictions Weight Bearing Restrictions: No      Mobility Bed Mobility               General bed mobility comments: Pt OOB in chair upon arrival  Transfers Overall transfer level: Needs assistance Equipment used: Rolling walker (2 wheeled) Transfers: Sit to/from Stand Sit to Stand: Mod assist         General transfer comment: cues for hand placement. Mod assist to boost up from chair x1    Balance Overall balance assessment: Needs assistance;History of Falls Sitting-balance support:  Feet supported;No upper extremity supported Sitting balance-Leahy Scale: Fair     Standing balance support: Bilateral upper extremity supported Standing balance-Leahy Scale: Poor Standing balance comment: RW for support                           ADL either performed or assessed with clinical judgement   ADL Overall ADL's : Needs assistance/impaired Eating/Feeding: Set up;Sitting   Grooming: Minimal assistance;Sitting   Upper Body Bathing: Minimal assistance;Sitting   Lower Body Bathing: Moderate assistance;Sit to/from stand   Upper Body Dressing : Minimal assistance;Sitting   Lower Body Dressing: Moderate assistance;Sit to/from stand   Toilet Transfer: Moderate assistance;Ambulation;RW Toilet Transfer Details (indicate cue type and reason): Simulated by sit to stand from chair with short distance functional mobility in room         Functional mobility during ADLs: Moderate assistance;Rolling walker       Vision         Perception     Praxis      Pertinent Vitals/Pain Pain Assessment: Faces Faces Pain Scale: Hurts a little bit Pain Location: head, generalized Pain Descriptors / Indicators: Discomfort Pain Intervention(s): Monitored during session     Hand Dominance Right   Extremity/Trunk Assessment Upper Extremity Assessment Upper Extremity Assessment: Generalized weakness   Lower Extremity Assessment Lower Extremity Assessment: Defer to PT evaluation   Cervical / Trunk Assessment Cervical / Trunk Assessment: Kyphotic   Communication Communication Communication: No difficulties   Cognition Arousal/Alertness: Awake/alert Behavior During Therapy: Impulsive;WFL for tasks assessed/performed Overall Cognitive Status: No family/caregiver present  to determine baseline cognitive functioning Area of Impairment: Orientation;Attention;Memory;Following commands;Safety/judgement;Awareness;Problem solving                 Orientation Level:  Disoriented to;Place;Time Current Attention Level: Focused Memory: Decreased short-term memory Following Commands: Follows one step commands inconsistently Safety/Judgement: Decreased awareness of deficits;Decreased awareness of safety Awareness: Intellectual Problem Solving: Slow processing;Difficulty sequencing;Requires verbal cues;Requires tactile cues     General Comments       Exercises     Shoulder Instructions      Home Living Family/patient expects to be discharged to:: Private residence (long term resident at [facility]) Living Arrangements: Alone Available Help at Discharge: Family;Friend(s);Available PRN/intermittently Type of Home: Other(Comment) ([employer]) Home Access: Level entry     Home Layout: One level     Bathroom Shower/Tub: Occupational psychologist: Standard     Home Equipment: Clinical cytogeneticist - 2 wheels;Cane - single point;Adaptive equipment Adaptive Equipment: Reacher        Prior Functioning/Environment Level of Independence: Needs assistance  Gait / Transfers Assistance Needed: RW for community mobility ADL's / Homemaking Assistance Needed: Pt reports she is mod I with BADL            OT Problem List: Decreased strength;Decreased activity tolerance;Impaired balance (sitting and/or standing);Decreased cognition;Decreased safety awareness;Decreased knowledge of use of DME or AE;Impaired UE functional use;Obesity;Pain;Increased edema      OT Treatment/Interventions: Self-care/ADL training;Therapeutic exercise;Energy conservation;DME and/or AE instruction;Therapeutic activities;Cognitive remediation/compensation;Patient/family education;Balance training    OT Goals(Current goals can be found in the care plan section) Acute Rehab OT Goals Patient Stated Goal: none stated OT Goal Formulation: With patient Time For Goal Achievement: 06/06/17 Potential to Achieve Goals: Good ADL Goals Pt Will Perform Grooming: with  supervision;standing Pt Will Perform Upper Body Bathing: with supervision;sitting Pt Will Perform Lower Body Bathing: with supervision;sit to/from stand Pt Will Transfer to Toilet: with supervision;ambulating;bedside commode Pt Will Perform Toileting - Clothing Manipulation and hygiene: with supervision;sit to/from stand  OT Frequency: Min 2X/week   Barriers to D/C: Decreased caregiver support          Co-evaluation              AM-PAC PT "6 Clicks" Daily Activity     Outcome Measure Help from another person eating meals?: None Help from another person taking care of personal grooming?: A Little Help from another person toileting, which includes using toliet, bedpan, or urinal?: A Little Help from another person bathing (including washing, rinsing, drying)?: A Lot Help from another person to put on and taking off regular upper body clothing?: A Little Help from another person to put on and taking off regular lower body clothing?: A Lot 6 Click Score: 17   End of Session Equipment Utilized During Treatment: Gait belt;Rolling walker Nurse Communication: Mobility status  Activity Tolerance: Patient tolerated treatment well Patient left: in chair;with call bell/phone within reach;with chair alarm set  OT Visit Diagnosis: Unsteadiness on feet (R26.81);Other abnormalities of gait and mobility (R26.89);Repeated falls (R29.6);Muscle weakness (generalized) (M62.81);Pain                Time: 3536-1443 OT Time Calculation (min): 18 min Charges:  OT General Charges $OT Visit: 1 Visit OT Evaluation $OT Eval Moderate Complexity: 1 Mod G-Codes:     Blanca Carreon A. Ulice Brilliant, M.S., OTR/L Pager: [place] 05/23/2017, 1:57 PM

## 2017-05-23 NOTE — Progress Notes (Signed)
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  Patient stated she was in her normal state of health when she tripped over a pile of her adult depends on the floor and fell.  She denied any loss of consciousness or preceding chest pain, palpitations, dizziness, changes in vision.  Family reported they found patient on floor for unspecified amount of time Of note patient was last admitted in September for new partial seizures in setting of brain metastasis.  She denied any similar symptoms on this presentation ( which she specified as no numbness in extremities or facial twitching) .  This morning she states that she is doing well and continues to deny any chest pain or palpitations. Her appetite is normal. No abdominal pain or shortness of breath  Subjective;   Denies chest pain.  She is confused.  Decline SnF   Assessment/Plan:   NSTEMI -On admission troponin of 1.75 in asymptomatic patient with no ischemic changes; however, TTE showed severely reduced EF with concomitant severe hypokinesis consistent with ischemia in the distribution of the left anterior descending coronary artery.  - Patient has not received any anticoagulation given right scalp hematoma in context of her fall.  Patient does have metastatic brain lesions however these are reported as stable based off MRI imaging in August. -Given concerns from oncologist about medication adherence I would be hesitant to consider cath that may require stent and necessity of dual antiplatelet agents.  - f/u cardiology recommendations to determine noninvasive vs  invasive treatment -ECHO with lower EF than previous, EF now at 35 %.  -medical care. Aspirin, statin, coreg.    Unwitnessed Mechanical ground level Fall, unknown etiology Altered Mental Status, resolved Right Scalp hematoma Unclear etiology, could be related to  NSTEMI vs medications related. . - Social Work consult given living conditions. Needs more long term placement.  -resume home dose ativan, PRN.  PT consult.  Still confused.   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 Currently receiving treatment with Herceptin with Faslodex - continue to monitor and assure follow up on outpatient   Seizure disorder, stable Related to metastatic brain lesions.  Reports of medication non-adherence per friend who manages her pillbox -continue on home keppra  -per POA, Patient has not been taking medications as she should.   UTI; UA growing E coli. Continue with  ceftriaxone.   Code Status: FULL  Family Communication: Spoke with friend/emergency contact Karis Juba. POA  Disposition Plan: pending cardiology recs given new wall motion abnormalities on echo   Consultants:  Cardiology  Procedures:  TTE 05/21/17 EF 30-35% (markedly worsened from LV systolic function in August 2018) Severe hypokinesis of mid-apicalanteroseptal, anterior, inferior, and apical myocardium; consistent with ischemia in the distribution of the left anterior descending coronary artery  Antimicrobials:  None  DVT prophylaxis:  Mechanical ( SCDs)   Objective: Vitals:   05/22/17 1657 05/22/17 2022 05/23/17 0500 05/23/17 1302  BP: 130/70 (!) 134/93 127/62 137/62  Pulse: 89 75 73 76  Resp:  16 18   Temp:  97.7  F (36.5 C) 97.9 F (36.6 C) 98.5 F (36.9 C)  TempSrc:  Oral Oral Oral  SpO2:  96% 99% 98%  Weight:   85.3 kg (188 lb)   Height:         Intake/Output Summary (Last 24 hours) at 05/23/17 1656 Last data filed at 05/23/17 0500  Gross per 24 hour  Intake              120 ml  Output              600 ml  Net             -480 ml   Filed Weights   05/21/17 0442 05/22/17 0418 05/23/17 0500  Weight: 90.2 kg (198 lb 14.4 oz) 89.5 kg (197 lb 4.8 oz) 85.3 kg (188 lb)    Exam:   General: NAD  Cardiovascular: S 1, S 2 RRR  Respiratory: CTA  Abdomen: Soft, nt, nd  Neurologic: alert, confuse  Skin: normal skin turgor    Data Reviewed: CBC:  Recent Labs Lab 05/20/17 1912 05/23/17 0257  WBC 13.8* 8.1  NEUTROABS 10.7*  --   HGB 13.7 12.0  HCT 40.3 37.2  MCV 100.2* 102.5*  PLT 224 188*   Basic Metabolic Panel:  Recent Labs Lab 05/20/17 1912 05/23/17 0257  NA 137 136  K 3.5 3.4*  CL 99* 102  CO2 26 25  GLUCOSE 127* 124*  BUN 25* 21*  CREATININE 1.13* 0.91  CALCIUM 8.7* 8.1*   GFR: Estimated Creatinine Clearance: 56.6 mL/min (by C-G formula based on SCr of 0.91 mg/dL). Liver Function Tests:  Recent Labs Lab 05/20/17 1912  AST 26  ALT 23  ALKPHOS 66  BILITOT 1.0  PROT 5.8*  ALBUMIN 3.1*   No results for input(s): LIPASE, AMYLASE in the last 168 hours. No results for input(s): AMMONIA in the last 168 hours. Coagulation Profile: No results for input(s): INR, PROTIME in the last 168 hours. Cardiac Enzymes:  Recent Labs Lab 05/20/17 1912 05/21/17 0348 05/21/17 0629  CKTOTAL 81  --   --   TROPONINI  --  2.29* 2.13*   BNP (last 3 results) No results for input(s): PROBNP in the last 8760 hours. HbA1C: No results for input(s): HGBA1C in the last 72 hours. CBG: No results for input(s): GLUCAP in the last 168 hours. Lipid Profile: No results for input(s): CHOL, HDL, LDLCALC, TRIG, CHOLHDL, LDLDIRECT in the last 72 hours. Thyroid Function Tests: No results for input(s): TSH, T4TOTAL, FREET4, T3FREE, THYROIDAB in the last 72 hours. Anemia Panel: No results for input(s): VITAMINB12,  FOLATE, FERRITIN, TIBC, IRON, RETICCTPCT in the last 72 hours. Urine analysis:    Component Value Date/Time   COLORURINE YELLOW 05/21/2017 0136   APPEARANCEUR HAZY (A) 05/21/2017 0136   LABSPEC 1.015 05/21/2017 0136   PHURINE 6.0 05/21/2017 0136   GLUCOSEU NEGATIVE 05/21/2017 0136   GLUCOSEU NEGATIVE 11/05/2014 1456   HGBUR NEGATIVE 05/21/2017 0136   BILIRUBINUR NEGATIVE 05/21/2017 0136   KETONESUR NEGATIVE 05/21/2017 0136   PROTEINUR NEGATIVE 05/21/2017 0136   UROBILINOGEN 0.2 11/05/2014 1456   NITRITE NEGATIVE 05/21/2017 0136   LEUKOCYTESUR NEGATIVE 05/21/2017 0136   Sepsis Labs: @LABRCNTIP (procalcitonin:4,lacticidven:4)  ) Recent Results (from the past 240 hour(s))  Culture, Urine     Status: Abnormal   Collection Time: 05/21/17  1:36 AM  Result Value Ref Range Status   Specimen Description URINE, RANDOM  Final   Special Requests NONE  Final   Culture >=100,000 COLONIES/mL  ESCHERICHIA COLI (A)  Final   Report Status 05/23/2017 FINAL  Final   Organism ID, Bacteria ESCHERICHIA COLI (A)  Final      Susceptibility   Escherichia coli - MIC*    AMPICILLIN <=2 SENSITIVE Sensitive     CEFAZOLIN <=4 SENSITIVE Sensitive     CEFTRIAXONE <=1 SENSITIVE Sensitive     CIPROFLOXACIN <=0.25 SENSITIVE Sensitive     GENTAMICIN <=1 SENSITIVE Sensitive     IMIPENEM <=0.25 SENSITIVE Sensitive     NITROFURANTOIN <=16 SENSITIVE Sensitive     TRIMETH/SULFA <=20 SENSITIVE Sensitive     AMPICILLIN/SULBACTAM <=2 SENSITIVE Sensitive     PIP/TAZO <=4 SENSITIVE Sensitive     Extended ESBL NEGATIVE Sensitive     * >=100,000 COLONIES/mL ESCHERICHIA COLI      Studies: No results found.  Scheduled Meds: . aspirin EC  81 mg Oral Daily  . atorvastatin  20 mg Oral q1800  . carvedilol  3.125 mg Oral BID WC  . lacosamide  100 mg Oral BID  . levETIRAcetam  500 mg Oral BID  . spironolactone  12.5 mg Oral Daily    Continuous Infusions: . cefTRIAXone (ROCEPHIN)  IV Stopped (05/23/17 1310)      LOS: 2 days     Elmarie Shiley, MD [facility] [phone]   If 7PM-7AM, please contact night-coverage [email or web address] Password [email or web address] 05/23/2017, 4:56 PM

## 2017-05-23 NOTE — Progress Notes (Signed)
HEMATOLOGY-ONCOLOGY PROGRESS NOTE  SUBJECTIVE: Patient is extremely confused and answers questions appropriately. She appears to have gotten further weaker. She's been admitted to the hospital because of a fall at her house. She tells that she was trying to fix her depends and subsequently fell down. She could not tell me how long she was down.  OBJECTIVE: REVIEW OF SYSTEMS:   Not obtainable since the patient is confused  PHYSICAL EXAMINATION: ECOG PERFORMANCE STATUS: 4 - Bedbound  Vitals:   05/22/17 2022 05/23/17 0500  BP: (!) 134/93 127/62  Pulse: 75 73  Resp: 16 18  Temp: 97.7 F (36.5 C) 97.9 F (36.6 C)  SpO2: 96% 99%   Filed Weights   05/21/17 0442 05/22/17 0418 05/23/17 0500  Weight: 198 lb 14.4 oz (90.2 kg) 197 lb 4.8 oz (89.5 kg) 188 lb (85.3 kg)    GENERAL:Somnolent, moon facies EYES: normal, Conjunctiva are pink and non-injected, sclera clear LUNGS: clear to auscultation and percussion with normal breathing effort HEART: regular rate & rhythm and no murmurs ABDOMEN:abdomen soft, non-tender and normal bowel sounds Musculoskeletal:no cyanosis of digits and no clubbing  NEURO: Confused and disoriented. She was able to recognize me but was not answering questions appropriately.  LABORATORY DATA:  I have reviewed the data as listed CMP Latest Ref Rng & Units 05/23/2017 05/20/2017 04/27/2017  Glucose 65 - 99 mg/dL 124(H) 127(H) -  BUN 6 - 20 mg/dL 21(H) 25(H) 34(A)  Creatinine 0.44 - 1.00 mg/dL 0.91 1.13(H) 0.9  Sodium 135 - 145 mmol/L 136 137 140  Potassium 3.5 - 5.1 mmol/L 3.4(L) 3.5 3.4  Chloride 101 - 111 mmol/L 102 99(L) -  CO2 22 - 32 mmol/L 25 26 -  Calcium 8.9 - 10.3 mg/dL 8.1(L) 8.7(L) -  Total Protein 6.5 - 8.1 g/dL - 5.8(L) -  Total Bilirubin 0.3 - 1.2 mg/dL - 1.0 -  Alkaline Phos 38 - 126 U/L - 66 58  AST 15 - 41 U/L - 26 17  ALT 14 - 54 U/L - 23 24    Lab Results  Component Value Date   WBC 8.1 05/23/2017   HGB 12.0 05/23/2017   HCT 37.2  05/23/2017   MCV 102.5 (H) 05/23/2017   PLT 140 (L) 05/23/2017   NEUTROABS 10.7 (H) 05/20/2017    ASSESSMENT AND PLAN: 1. Delirium: Currently on Rocephin IV for possible infection 2. metastatic breast cancer: I do not believe that the patient's performance status is strong enough to continue with her therapy. I believe that she is a candidate for hospice care given her declining performance status and her inability to function. Even though her systemic disease has not progressed, her performance status has been declining significant enough that makes hospice a reasonable choice. I tried to talk to the patient about hospice but she is in no position to understand or make any consent because of her confusion.

## 2017-05-24 ENCOUNTER — Ambulatory Visit: Payer: Medicare Other | Admitting: Hematology and Oncology

## 2017-05-24 ENCOUNTER — Ambulatory Visit: Payer: Medicare Other

## 2017-05-24 ENCOUNTER — Other Ambulatory Visit: Payer: Medicare Other

## 2017-05-24 DIAGNOSIS — I255 Ischemic cardiomyopathy: Secondary | ICD-10-CM

## 2017-05-24 LAB — BASIC METABOLIC PANEL
Anion gap: 11 (ref 5–15)
BUN: 19 mg/dL (ref 6–20)
CHLORIDE: 102 mmol/L (ref 101–111)
CO2: 23 mmol/L (ref 22–32)
Calcium: 8.3 mg/dL — ABNORMAL LOW (ref 8.9–10.3)
Creatinine, Ser: 1.01 mg/dL — ABNORMAL HIGH (ref 0.44–1.00)
GFR calc Af Amer: >60 mL/min (ref >60)
GFR calc non Af Amer: 54 mL/min — ABNORMAL LOW (ref >60)
GLUCOSE: 137 mg/dL — AB (ref 65–99)
POTASSIUM: 4.1 mmol/L (ref 3.5–5.1)
Sodium: 136 mmol/L (ref 135–145)

## 2017-05-24 MED ORDER — LOSARTAN POTASSIUM 25 MG PO TABS
12.5000 mg | ORAL_TABLET | Freq: Every day | ORAL | Status: DC
  Administered 2017-05-24 – 2017-05-26 (×3): 12.5 mg via ORAL
  Filled 2017-05-24 (×3): qty 1

## 2017-05-24 MED ORDER — CEFUROXIME AXETIL 500 MG PO TABS
500.0000 mg | ORAL_TABLET | Freq: Two times a day (BID) | ORAL | Status: AC
  Administered 2017-05-25 – 2017-05-26 (×4): 500 mg via ORAL
  Filled 2017-05-24 (×5): qty 1

## 2017-05-24 NOTE — Evaluation (Signed)
Physical Therapy Evaluation Patient Details Name: Savannah Howard MRN: [id number] DOB: March 20, 1946 Today's Date: 05/24/2017   History of Present Illness  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  Patient stated she was in her normal state of health when she tripped over a pile of her adult depends on the floor and fell. Troponins elevated, and pt diagnosed with NSTEMI and new chf.  Clinical Impression  Pt admitted with above diagnosis and presents to PT with functional limitations due to deficits listed below (See PT problem list). Pt needs skilled PT to maximize independence and safety to allow discharge to SNF. Pt with physical and cognitive deficits that make further rehab prior to pt living alone.     Follow Up Recommendations SNF    Equipment Recommendations  None recommended by PT    Recommendations for Other Services       Precautions / Restrictions Precautions Precautions: Fall Restrictions Weight Bearing Restrictions: No      Mobility  Bed Mobility Overal bed mobility: Needs Assistance Bed Mobility: Supine to Sit     Supine to sit: Min assist;HOB elevated     General bed mobility comments: assist to elevate trunk into sitting  Transfers Overall transfer level: Needs assistance Equipment used: Rolling walker (2 wheeled) Transfers: Sit to/from Stand Sit to Stand: Min assist         General transfer comment: Assist to bring hips up and for balance  Ambulation/Gait Ambulation/Gait assistance: Min assist Ambulation Distance (Feet): 100 Feet Assistive device: Rolling walker (2 wheeled) Gait Pattern/deviations: Step-through pattern;Decreased step length - right;Decreased step length - left;Trunk flexed;Wide base of  support Gait velocity: decr Gait velocity interpretation: Below normal speed for age/gender General Gait Details: Assist for balance and safety. Amb on RA with SpO2 96% after amb  Stairs            Wheelchair Mobility    Modified Rankin (Stroke Patients Only)       Balance Overall balance assessment: Needs assistance;History of Falls Sitting-balance support: Feet supported;No upper extremity supported Sitting balance-Leahy Scale: Fair     Standing balance support: Bilateral upper extremity supported Standing balance-Leahy Scale: Poor Standing balance comment: walker and supervision for static standing                             Pertinent Vitals/Pain Pain Assessment: No/denies pain    Home Living Family/patient expects to be discharged to:: Private residence (long term resident at [facility]) Living Arrangements: Alone Available Help at Discharge: Family;Friend(s);Available PRN/intermittently Type of Home: Other(Comment) ([employer]) Home Access: Level entry     Home Layout: One level Home Equipment: Clinical cytogeneticist - 2 wheels;Cane - single point;Adaptive equipment      Prior Function Level of Independence: Needs assistance   Gait / Transfers Assistance Needed: RW for community mobility  ADL's / Homemaking Assistance Needed: Pt reports she is mod I with BADL        Hand Dominance   Dominant Hand: Right    Extremity/Trunk Assessment   Upper Extremity Assessment Upper Extremity Assessment: Defer to OT evaluation    Lower Extremity Assessment Lower Extremity Assessment: Generalized weakness    Cervical / Trunk Assessment Cervical / Trunk Assessment: Kyphotic  Communication  Communication: No difficulties  Cognition Arousal/Alertness: Awake/alert Behavior During Therapy: Impulsive Overall Cognitive Status: No family/caregiver present to determine baseline cognitive functioning Area of Impairment: Orientation;Attention;Memory;Following  commands;Safety/judgement;Awareness;Problem solving                 Orientation Level: Disoriented to;Time;Situation;Place ([facility] to [facility] she is at rehab) Current Attention Level: Focused Memory: Decreased short-term memory Following Commands: Follows one step commands consistently Safety/Judgement: Decreased awareness of deficits;Decreased awareness of safety Awareness: Intellectual Problem Solving: Slow processing;Difficulty sequencing;Requires verbal cues;Requires tactile cues        General Comments      Exercises     Assessment/Plan    PT Assessment Patient needs continued PT services  PT Problem List Decreased strength;Decreased activity tolerance;Decreased balance;Decreased mobility;Decreased cognition;Decreased knowledge of use of DME;Decreased safety awareness       PT Treatment Interventions Gait training;DME instruction;Functional mobility training;Therapeutic activities;Therapeutic exercise;Balance training;Patient/family education;Cognitive remediation    PT Goals (Current goals can be found in the Care Plan section)  Acute Rehab PT Goals Patient Stated Goal: none stated PT Goal Formulation: With patient Time For Goal Achievement: 06/07/17 Potential to Achieve Goals: Good    Frequency Min 2X/week   Barriers to discharge Decreased caregiver support lives alone in hotel    Co-evaluation               AM-PAC PT "6 Clicks" Daily Activity  Outcome Measure Difficulty turning over in bed (including adjusting bedclothes, sheets and blankets)?: A Little Difficulty moving from lying on back to sitting on the side of the bed? : Unable Difficulty sitting down on and standing up from a chair with arms (e.g., wheelchair, bedside commode, etc,.)?: Unable Help needed moving to and from a bed to chair (including a wheelchair)?: A Little Help needed walking in hospital room?: A Little Help needed climbing 3-5 steps with a railing? :  A Lot 6 Click Score: 13    End of Session Equipment Utilized During Treatment: Gait belt Activity Tolerance: Patient tolerated treatment well Patient left: in chair;with call bell/phone within reach;with chair alarm set Nurse Communication: Mobility status PT Visit Diagnosis: Unsteadiness on feet (R26.81);History of falling (Z91.81);Muscle weakness (generalized) (M62.81)    Time: 8563-1497 PT Time Calculation (min) (ACUTE ONLY): 15 min   Charges:   PT Evaluation $PT Eval Moderate Complexity: 1 Mod     PT G CodesMarland Kitchen        [facility] PT [place] 05/24/2017, 12:34 PM

## 2017-05-24 NOTE — Progress Notes (Signed)
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  Patient stated she was in her normal state of health when she tripped over a pile of her adult depends on the floor and fell.  She denied any loss of consciousness or preceding chest pain, palpitations, dizziness, changes in vision.  Family reported they found patient on floor for unspecified amount of time Of note patient was last admitted in September for new partial seizures in setting of brain metastasis.  She denied any similar symptoms on this presentation ( which she specified as no numbness in extremities or facial twitching) .  This morning she states that she is doing well and continues to deny any chest pain or palpitations. Her appetite is normal. No abdominal pain or shortness of breath  Subjective;  Confuse, denies chest pain or dyspnea.  Was walking in the hall with the assistance of PT>  She would refer decision for placement to her POA>   Assessment/Plan:   NSTEMI -On admission troponin of 1.75 in asymptomatic patient with no ischemic changes; however, TTE showed severely reduced EF with concomitant severe hypokinesis consistent with ischemia in the distribution of the left anterior descending coronary artery.  - Patient has not received any anticoagulation given right scalp hematoma in context of her fall.  Patient does have metastatic brain lesions however these are reported as stable based off MRI imaging in August. -Given concerns from oncologist about medication adherence I would be hesitant to consider cath that may require stent and necessity of dual  antiplatelet agents.  - f/u cardiology recommendations to determine noninvasive vs invasive treatment -ECHO with lower EF than previous, EF now at 35 %.  -medical care. Aspirin, statin, coreg. Cozaar, spironolactone.    Unwitnessed Mechanical ground level Fall, unknown etiology Altered Mental Status, resolved Right Scalp hematoma Unclear etiology, could be related to  NSTEMI vs medications related. . - Social Work consult given living conditions. Needs more long term placement.  -resume home dose ativan, PRN.  PT consult.  Still confused.   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 Currently receiving treatment with Herceptin with Faslodex - continue to monitor and assure follow up on outpatient   Seizure disorder, stable Related to metastatic brain lesions.  Reports of medication non-adherence per friend who manages her pillbox -continue on home keppra , Vimpat  -per POA, Patient has not been taking medications as she should.   UTI; UA growing E coli. Change   Ceftriaxone to ceftin total 5 days.   Code Status: FULL  Family Communication: Spoke with friend/emergency contact Savannah Howard. POA  Disposition Plan: pending cardiology recs given new wall motion abnormalities on echo   Consultants:  Cardiology  Procedures:  TTE 05/21/17 EF 30-35% (markedly worsened from LV systolic function in August 2018) Severe hypokinesis of mid-apicalanteroseptal, anterior, inferior, and apical myocardium; consistent with ischemia in the distribution of the left anterior descending coronary artery  Antimicrobials:  None  DVT prophylaxis:  Mechanical ( SCDs)   Objective: Vitals:   05/23/17 1302 05/23/17 1947 05/24/17 7564  05/24/17 1325  BP: 137/62 138/69 128/67 118/64  Pulse: 76 80  86  Resp:  18 16 18   Temp: 98.5 F (36.9 C) 99.8 F (37.7  C) 98.2 F (36.8 C) 98.4 F (36.9 C)  TempSrc: Oral Oral Oral Oral  SpO2: 98% 100%  100%  Weight:      Height:        Intake/Output Summary (Last 24 hours) at 05/24/17 1540 Last data filed at 05/24/17 0941  Gross per 24 hour  Intake              100 ml  Output             1300 ml  Net            -1200 ml   Filed Weights   05/21/17 0442 05/22/17 0418 05/23/17 0500  Weight: 90.2 kg (198 lb 14.4 oz) 89.5 kg (197 lb 4.8 oz) 85.3 kg (188 lb)    Exam:   General: NAD  Cardiovascular: S 1, S 2 RRR  Respiratory: CTA  Abdomen: Soft, nt, nd  Neurologic: alert, confuse  Skin: normal skin turgor    Data Reviewed: CBC:  Recent Labs Lab 05/20/17 1912 05/23/17 0257  WBC 13.8* 8.1  NEUTROABS 10.7*  --   HGB 13.7 12.0  HCT 40.3 37.2  MCV 100.2* 102.5*  PLT 224 875*   Basic Metabolic Panel:  Recent Labs Lab 05/20/17 1912 05/23/17 0257 05/24/17 0312  NA 137 136 136  K 3.5 3.4* 4.1  CL 99* 102 102  CO2 26 25 23   GLUCOSE 127* 124* 137*  BUN 25* 21* 19  CREATININE 1.13* 0.91 1.01*  CALCIUM 8.7* 8.1* 8.3*   GFR: Estimated Creatinine Clearance: 51 mL/min (A) (by C-G formula based on SCr of 1.01 mg/dL (H)). Liver Function Tests:  Recent Labs Lab 05/20/17 1912  AST 26  ALT 23  ALKPHOS 66  BILITOT 1.0  PROT 5.8*  ALBUMIN 3.1*   No results for input(s): LIPASE, AMYLASE in the last 168 hours. No results for input(s): AMMONIA in the last 168 hours. Coagulation Profile: No results for input(s): INR, PROTIME in the last 168 hours. Cardiac Enzymes:  Recent Labs Lab 05/20/17 1912 05/21/17 0348 05/21/17 0629  CKTOTAL 81  --   --   TROPONINI  --  2.29* 2.13*   BNP (last 3 results) No results for input(s): PROBNP in the last 8760 hours. HbA1C: No results for input(s): HGBA1C in the last 72 hours. CBG: No results for input(s): GLUCAP in the last 168 hours. Lipid Profile: No results for input(s): CHOL, HDL, LDLCALC, TRIG, CHOLHDL, LDLDIRECT in the last  72 hours. Thyroid Function Tests: No results for input(s): TSH, T4TOTAL, FREET4, T3FREE, THYROIDAB in the last 72 hours. Anemia Panel: No results for input(s): VITAMINB12, FOLATE, FERRITIN, TIBC, IRON, RETICCTPCT in the last 72 hours. Urine analysis:    Component Value Date/Time   COLORURINE YELLOW 05/21/2017 0136   APPEARANCEUR HAZY (A) 05/21/2017 0136   LABSPEC 1.015 05/21/2017 0136   PHURINE 6.0 05/21/2017 0136   GLUCOSEU NEGATIVE 05/21/2017 0136   GLUCOSEU NEGATIVE 11/05/2014 1456   HGBUR NEGATIVE 05/21/2017 0136   BILIRUBINUR NEGATIVE 05/21/2017 0136   KETONESUR NEGATIVE 05/21/2017 0136   PROTEINUR NEGATIVE 05/21/2017 0136   UROBILINOGEN 0.2 11/05/2014 1456   NITRITE NEGATIVE 05/21/2017 0136   LEUKOCYTESUR NEGATIVE 05/21/2017 0136   Sepsis Labs: @LABRCNTIP (procalcitonin:4,lacticidven:4)  ) Recent Results (from the past 240 hour(s))  Culture, Urine     Status: Abnormal  Collection Time: 05/21/17  1:36 AM  Result Value Ref Range Status   Specimen Description URINE, RANDOM  Final   Special Requests NONE  Final   Culture >=100,000 COLONIES/mL ESCHERICHIA COLI (A)  Final   Report Status 05/23/2017 FINAL  Final   Organism ID, Bacteria ESCHERICHIA COLI (A)  Final      Susceptibility   Escherichia coli - MIC*    AMPICILLIN <=2 SENSITIVE Sensitive     CEFAZOLIN <=4 SENSITIVE Sensitive     CEFTRIAXONE <=1 SENSITIVE Sensitive     CIPROFLOXACIN <=0.25 SENSITIVE Sensitive     GENTAMICIN <=1 SENSITIVE Sensitive     IMIPENEM <=0.25 SENSITIVE Sensitive     NITROFURANTOIN <=16 SENSITIVE Sensitive     TRIMETH/SULFA <=20 SENSITIVE Sensitive     AMPICILLIN/SULBACTAM <=2 SENSITIVE Sensitive     PIP/TAZO <=4 SENSITIVE Sensitive     Extended ESBL NEGATIVE Sensitive     * >=100,000 COLONIES/mL ESCHERICHIA COLI      Studies: No results found.  Scheduled Meds: . aspirin EC  81 mg Oral Daily  . atorvastatin  20 mg Oral q1800  . carvedilol  3.125 mg Oral BID WC  . lacosamide  100  mg Oral BID  . levETIRAcetam  500 mg Oral BID  . losartan  12.5 mg Oral QHS  . spironolactone  12.5 mg Oral Daily    Continuous Infusions: . cefTRIAXone (ROCEPHIN)  IV Stopped (05/24/17 1249)     LOS: 3 days     Elmarie Shiley, MD [facility] [phone]   If 7PM-7AM, please contact night-coverage [email or web address] Password [email or web address] 05/24/2017, 3:40 PM

## 2017-05-24 NOTE — Clinical Social Work Note (Signed)
Clinical Social Work Assessment  Patient Details  Name: Savannah Howard MRN: [id number] Date of Birth: Dec 25, 1945  Date of referral:  05/24/17               Reason for consult:  Facility Placement                Permission sought to share information with:  Family Supports Permission granted to share information::  No (patient disoriented, spoke to [employer] via phone)  Name::     Karis Juba  Agency::  SNFs  Relationship::  friend/POA  Contact Information:  [phone]  Housing/Transportation Living arrangements for the past 2 months:  Hotel/Motel Source of Information:  Patient, Other (Comment Required) (friend/POA) Patient Interpreter Needed:  None Criminal Activity/Legal Involvement Pertinent to Current Situation/Hospitalization:  No - Comment as needed Significant Relationships:  Friend Lives with:  Self Do you feel safe going back to the place where you live?    Need for family participation in patient care:  Yes (Comment)  Care giving concerns: Patient has lived alone in hotel for several years. PT recommending SNF.   Social Worker assessment / plan: Patient is disoriented, as confirmed by RN, PT, and MD. [place] called patient's friend and POA, Karis Juba, and discussed discharge planning. POA prefers for patient to discharge to [facility]. Patient was recently admitted in [facility] for 20 days and then discharged back to hotel. CSW explained patient would now be in copay days if returning to SNF. POA reported she is working on Licensed conveyancer some of patient's assets, as patient owns a home (even though she does not live there) and also owns some vehicles. Per POA though, at this time patient does not have the money to pay for care. POA aware of copay amount per day at [facility]. POA considering Medicaid application for patient and plans to contact DSS today. POA aware of recommendation for longer term care at SNF and agreeable that patient should not be living alone. CSW to send out  initial referrals to [facility] and other SNFs and will follow up with POA for disposition planning.   Employment status:  Retired Forensic scientist:  [employer] PT Recommendations:  [place] / Referral to community resources:  [place]  Patient/Family's Response to care: POA appreciative of care.  Patient/Family's Understanding of and Emotional Response to Diagnosis, Current Treatment, and Prognosis: POA in touch with medical staff and aware of patient's conditions. POA hopeful for SNF placement, though with concern about copays.  Emotional Assessment Appearance:  Appears stated age Attitude/Demeanor/Rapport:  Unable to Assess (patient disoriented) Affect (typically observed):  Unable to Assess (patient disoriented) Orientation:  Oriented to Self Alcohol / Substance use:  Not Applicable Psych involvement (Current and /or in the community):  No (Comment)  Discharge Needs  Concerns to be addressed:  Discharge Planning Concerns Readmission within the last 30 days:  Yes Current discharge risk:  Physical Impairment, Cognitively Impaired Barriers to Discharge:  Continued Medical Work up   Estanislado Emms, LCSW 05/24/2017, 12:46 PM

## 2017-05-24 NOTE — NC FL2 (Signed)
[place] LEVEL OF CARE SCREENING TOOL     IDENTIFICATION  Patient Name: Savannah Howard Birthdate: [birth year] Sex: female Admission Date (Current Location): 05/20/2017  [facility] and [place] Number:  Herbalist and Address:  [facility], [place] [address] [place], [place] [address]      Provider Number: [id number]  Attending Physician Name and Address:  Elmarie Shiley, MD  Relative Name and Phone Number:  Ernst Bowler [phone]    Current Level of Care: Hospital Recommended Level of Care: [place] Prior Approval Number:    Date Approved/Denied:   PASRR Number: [id number]  Discharge Plan: SNF    Current Diagnoses: Patient Active Problem List   Diagnosis Date Noted  . NSTEMI (non-ST elevated myocardial infarction) ([place]) 05/21/2017  . Elevated troponin 05/20/2017  . E. coli UTI 04/19/2017  . Simple partial seizures ([place]) 04/18/2017  . Seizures ([place]) 04/17/2017  . Primary malignant neoplasm of left breast with metastasis to other site [facility])   . Seizure ([place])   . Hypokalemia 01/07/2017  . Hypomagnesemia 01/07/2017  . Hyperglycemia 01/07/2017  . Seizure disorder ([place]) 01/07/2017  . Altered mental status 01/06/2017  . Palliative care by specialist   . Malignant brain tumor ([place]) 07/18/2016  . Breast cancer metastasized to brain ([place]) 07/18/2016  . Knee pain, left 07/18/2016  . Pressure injury of skin 07/11/2016  . Fall 07/10/2016  . UTI (urinary tract infection) 07/10/2016  . Rhabdomyolysis 07/10/2016  . Syncope 07/10/2016  . 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 Sepsis ([place]) 07/10/2016  . Urinary tract infection with hematuria   . Brain tumor ([place])   . Antineoplastic chemotherapy induced anemia 04/02/2015  . Weakness generalized 01/29/2015  . Dehydration 01/28/2015  . Rash 01/28/2015  . Chemotherapy induced diarrhea 01/08/2015  . Mucositis due to chemotherapy  01/08/2015  . Breast cancer of upper-outer quadrant of left female breast ([place]) 12/20/2014  . Well adult exam 10/26/2012  . Dyslipidemia 06/23/2011  . Hypertension 12/22/2010  . UPPER RESPIRATORY INFECTION, ACUTE 04/30/2010  . Anxiety 10/28/2009  . Vitamin D deficiency 04/24/2009  . HYPERGLYCEMIA 04/24/2009  . B12 deficiency 10/22/2008  . ALLERGIC RHINITIS 05/09/2007  . GERD 05/09/2007    Orientation RESPIRATION BLADDER Height & Weight     Self  Normal Continent, External catheter Weight: 188 lb (85.3 kg) Height:  5\' 2"  (157.5 cm)  BEHAVIORAL SYMPTOMS/MOOD NEUROLOGICAL BOWEL NUTRITION STATUS      Continent Diet (please see DC summary)  AMBULATORY STATUS COMMUNICATION OF NEEDS Skin   Limited Assist Verbally PU Stage and Appropriate Care (PU stage II mid coccyx, foam dressing)                       Personal Care Assistance Level of Assistance  Bathing, Feeding, Dressing Bathing Assistance: Limited assistance Feeding assistance: Limited assistance Dressing Assistance: Limited assistance     Functional Limitations Info  Sight, Hearing, Speech Sight Info: Adequate Hearing Info: Adequate Speech Info: Adequate    SPECIAL CARE FACTORS FREQUENCY  PT (By licensed PT), OT (By licensed OT)     PT Frequency: 5x/week OT Frequency: 5x/week            Contractures Contractures Info: Not present    Additional Factors Info  Code Status, Allergies Code Status Info: DNR Allergies Info: Penicillins, Sulfonamide Derivatives           Current Medications (05/24/2017):  This is the current  hospital active medication list Current Facility-Administered Medications  Medication Dose Route Frequency Provider Last Rate Last Dose  . acetaminophen (TYLENOL) tablet 650 mg  650 mg Oral Q4H PRN Phillips Grout, MD   650 mg at 05/24/17 0214  . aspirin EC tablet 81 mg  81 mg Oral Daily Bensimhon, Shaune Pascal, MD   81 mg at 05/24/17 0946  . atorvastatin (LIPITOR) tablet 20 mg  20 mg Oral  q1800 Bensimhon, Shaune Pascal, MD   20 mg at 05/23/17 1642  . carvedilol (COREG) tablet 3.125 mg  3.125 mg Oral BID WC Bensimhon, Shaune Pascal, MD   3.125 mg at 05/24/17 0947  . cefTRIAXone (ROCEPHIN) 1 g in dextrose 5 % 50 mL IVPB  1 g Intravenous Q24H Regalado, Belkys A, MD 100 mL/hr at 05/24/17 1219 1 g at 05/24/17 1219  . lacosamide (VIMPAT) tablet 100 mg  100 mg Oral BID Lovey Newcomer T, NP   100 mg at 05/24/17 0946  . levETIRAcetam (KEPPRA) tablet 500 mg  500 mg Oral BID Lovey Newcomer T, NP   500 mg at 05/24/17 0947  . LORazepam (ATIVAN) tablet 0.5 mg  0.5 mg Oral TID PRN Regalado, Belkys A, MD      . losartan (COZAAR) tablet 12.5 mg  12.5 mg Oral QHS Shirley Friar, PA-C      . ondansetron [facility]) injection 4 mg  4 mg Intravenous Q6H PRN Phillips Grout, MD   4 mg at 05/23/17 1643  . spironolactone (ALDACTONE) tablet 12.5 mg  12.5 mg Oral Daily Bensimhon, Shaune Pascal, MD   12.5 mg at 05/24/17 9480     Discharge Medications: Please see discharge summary for a list of discharge medications.  Relevant Imaging Results:  Relevant Lab Results:   Additional Information SSN: [id number]  Estanislado Emms, LCSW

## 2017-05-24 NOTE — Progress Notes (Signed)
Hem-Onc I spoke to the patient's power of attorney Karis Juba. I explained to her that Savannah Howard is declining rapidly and that we should consider hospice care. She was in agreement She would like to use [facility]. Please consult [facility]

## 2017-05-24 NOTE — Progress Notes (Signed)
Advanced Heart Failure Rounding Note  PCP: Dr. Alain Marion Primary Cardiologist: Dr. Haroldine Laws   Subjective:    Feeling good today. Head still sore.  Denies SOB, CP, or lightheadedness/dizziness.   I/O relatively stable. (down 1 L overall. Creatinine stable.   Objective:   Weight Range: 188 lb (85.3 kg) Body mass index is 34.39 kg/m.   Vital Signs:   Temp:  [98.2 F (36.8 C)-99.8 F (37.7 C)] 98.2 F (36.8 C) (10/15 0438) Pulse Rate:  [76-80] 80 (10/14 1947) Resp:  [16-18] 16 (10/15 0438) BP: (128-138)/(62-69) 128/67 (10/15 0438) SpO2:  [98 %-100 %] 100 % (10/14 1947) Last BM Date: 05/21/17  Weight change: Filed Weights   05/21/17 0442 05/22/17 0418 05/23/17 0500  Weight: 198 lb 14.4 oz (90.2 kg) 197 lb 4.8 oz (89.5 kg) 188 lb (85.3 kg)    Intake/Output:   Intake/Output Summary (Last 24 hours) at 05/24/17 0956 Last data filed at 05/24/17 0941  Gross per 24 hour  Intake              100 ml  Output             1300 ml  Net            -1200 ml     Physical Exam    General:  Elderly appearing. No resp difficulty HEENT: Normal Neck: Supple. JVP 6-7 cm. Carotids 2+ bilat; no bruits. No lymphadenopathy or thyromegaly appreciated. Cor: PMI nondisplaced. Regular rate & rhythm. No rubs, gallops or murmurs. Lungs: Clear Abdomen: Soft, nontender, nondistended. No hepatosplenomegaly. No bruits or masses. Good bowel sounds. Extremities: No cyanosis, clubbing, rash, edema Neuro: Alert & orientedx3, cranial nerves grossly intact. moves all 4 extremities w/o difficulty. Affect flat but appropriate.  Skin: Right occipital region with ecchymosis and hematoma. Slightly tender to the touch.    Telemetry   NSR, Personally reviewed.  EKG    Poor quality EKG on admit with apparent new Q waves in v2-v3.  Labs    CBC  Recent Labs  05/23/17 0257  WBC 8.1  HGB 12.0  HCT 37.2  MCV 102.5*  PLT 937*   Basic Metabolic Panel  Recent Labs  05/23/17 0257  05/24/17 0312  NA 136 136  K 3.4* 4.1  CL 102 102  CO2 25 23  GLUCOSE 124* 137*  BUN 21* 19  CREATININE 0.91 1.01*  CALCIUM 8.1* 8.3*   Liver Function Tests No results for input(s): AST, ALT, ALKPHOS, BILITOT, PROT, ALBUMIN in the last 72 hours. No results for input(s): LIPASE, AMYLASE in the last 72 hours. Cardiac Enzymes No results for input(s): CKTOTAL, CKMB, CKMBINDEX, TROPONINI in the last 72 hours.  BNP: BNP (last 3 results)  Recent Labs  07/10/16 0420  BNP 222.2*    ProBNP (last 3 results) No results for input(s): PROBNP in the last 8760 hours.   D-Dimer No results for input(s): DDIMER in the last 72 hours. Hemoglobin A1C No results for input(s): HGBA1C in the last 72 hours. Fasting Lipid Panel No results for input(s): CHOL, HDL, LDLCALC, TRIG, CHOLHDL, LDLDIRECT in the last 72 hours. Thyroid Function Tests No results for input(s): TSH, T4TOTAL, T3FREE, THYROIDAB in the last 72 hours.  Invalid input(s): FREET3  Other results:   Imaging     No results found.   Medications:     Scheduled Medications: . aspirin EC  81 mg Oral Daily  . atorvastatin  20 mg Oral q1800  . carvedilol  3.125 mg Oral  BID WC  . lacosamide  100 mg Oral BID  . levETIRAcetam  500 mg Oral BID  . spironolactone  12.5 mg Oral Daily     Infusions: . cefTRIAXone (ROCEPHIN)  IV Stopped (05/23/17 1310)     PRN Medications:  acetaminophen, LORazepam, ondansetron (ZOFRAN) IV    Patient Profile   Savannah Howard is a 71 y.o. female with h/o of recurrent breast CA and brain mets.   Presented 05/21/17 with fall. Found to have mild troponin elevation into 2.2 range with newly decreased EF with WMA. CHF consulted.   Assessment/Plan   1. NSTEMI - Pt denies chest pain currently or PTA.  - Max troponin 2.29 this admit.  - Per Dr. Haroldine Laws, recommend supportive care with HF meds and ASA.   - She is not a candidate for cath at this time.  - DDx includes silent MI vs  possible Takotsubo cardiomyopathy.EKG concerning for out-of-hospital MI.  - Continue ASA and statin 2. New systolic CHF - Echo 09/32/67 LVEF 30-35% with severe HK, mild LAE, mildly reduced RV.  - Volume status looks OK on exam - Continue coreg 3.125 mg BID - Add losartan 12.5 mg qhs and follow BMET. - Continue spiro 12.5 mg daily.  - ? Silent MI vs possible Takotsubo cardiomyopathy. 3. Left ER/HER+ Breast CA with brain mets s/p neoadjuvant chemo - Metastatic brain lesions (right temporal dx'd 06/2016) stable on recent MRI imaging in 04/2017.  - Currently receiving Herceptin with Faslodex 4. HTN 5. UTI - WBC 8.1 05/23/17 - Tmax 99.8 - Ceftriaxone per primary.  6. Seizure disorders - Adam[place] fall not related to seizures - Continue Keppra and Vimpat.  7. Code status - Pt now DNR/DNI. Appreciate Palliative care support.   She is stable from a HF perspective. Will continue to gently adjust her HF medications. Will schedule follow-up in HF clinic once disposition decided. PT/OT recommending SNF.   Length of Stay: 3  Savannah Howard  05/24/2017, 9:56 AM  Advanced Heart Failure Team Pager [phone] (M-F; 7a - 4p)  Please contact [facility] for night-coverage after hours (4p -7a ) and weekends on [email or web address]  Patient seen and examined with the above-signed Advanced Practice Provider and/or Housestaff. I personally reviewed laboratory data, imaging studies and relevant notes. I independently examined the patient and formulated the important aspects of the plan. I have edited the note to reflect any of my changes or salient points. I have personally discussed the plan with the patient and/or family.  Improved today. More clear. Agree with plans for Hospice care. No evidence of ischemia or HF on exam .  Would treat with:  Ecasa 81 Coreg 3.125 bid Losartan 12.5 daily Spiro 12.5 daily Lasix prn.  Continue statin for now but can d/c if going to hospice   We will sign  off. D/w Dr. Tyrell Antonio.   Glori Bickers, MD  10:49 PM

## 2017-05-25 DIAGNOSIS — Z85841 Personal history of malignant neoplasm of brain: Secondary | ICD-10-CM

## 2017-05-25 DIAGNOSIS — F4320 Adjustment disorder, unspecified: Secondary | ICD-10-CM

## 2017-05-25 DIAGNOSIS — Z853 Personal history of malignant neoplasm of breast: Secondary | ICD-10-CM

## 2017-05-25 LAB — BASIC METABOLIC PANEL
Anion gap: 10 (ref 5–15)
BUN: 18 mg/dL (ref 6–20)
CALCIUM: 8.2 mg/dL — AB (ref 8.9–10.3)
CO2: 23 mmol/L (ref 22–32)
CREATININE: 1.05 mg/dL — AB (ref 0.44–1.00)
Chloride: 104 mmol/L (ref 101–111)
GFR, EST AFRICAN AMERICAN: 60 mL/min — AB (ref >60)
GFR, EST NON AFRICAN AMERICAN: 52 mL/min — AB (ref >60)
GLUCOSE: 103 mg/dL — AB (ref 65–99)
Potassium: 3.8 mmol/L (ref 3.5–5.1)
Sodium: 137 mmol/L (ref 135–145)

## 2017-05-25 NOTE — Progress Notes (Signed)
Palliative Medicine RN Note: Rec'd call from Dr Tyrell Antonio requesting another conversation re: Savannah Howard, as pt wants to go back to the hotel but CG wants her to have placement at a facility. Discussed pt with Dr Rowe Pavy and several NPs from PMT; recommend psych eval to determine patient's capacity for decisions. Once this is done & it is clear who should make decisions, PMT will be able to meet again with the appropriate party. I called Dr Tyrell Antonio, who has already made a psych referral.  Marjie Skiff. Omeka Holben, RN, BSN, [facility] 05/25/2017 1:18 PM Cell [phone] 8:00-4:00 Monday-Friday Office [phone]

## 2017-05-25 NOTE — Consult Note (Signed)
[facility] Consult   Reason for Consult:  Capacity evaluation Referring Physician:  Dr. Tyrell Antonio Patient Identification: Savannah Howard MRN:  [id number] Principal Diagnosis: NSTEMI (non-ST elevated myocardial infarction) [facility]) Diagnosis:   Patient Active Problem List   Diagnosis Date Noted  . Cardiomyopathy, ischemic [I25.5]   . NSTEMI (non-ST elevated myocardial infarction) ([place]) [I21.4] 05/21/2017  . Elevated troponin [R74.8] 05/20/2017  . E. coli UTI [N39.0, B96.20] 04/19/2017  . Simple partial seizures ([place]) [G40.109] 04/18/2017  . Seizures ([place]) [R56.9] 04/17/2017  . Primary malignant neoplasm of left breast with metastasis to other site ([place]) [C50.912]   . Seizure ([place]) [R56.9]   . Hypokalemia [E87.6] 01/07/2017  . Hypomagnesemia [E83.42] 01/07/2017  . Hyperglycemia [R73.9] 01/07/2017  . Seizure disorder ([place]) [K02.542] 01/07/2017  . Altered mental status [R41.82] 01/06/2017  . Palliative care by specialist [Z51.5]   . Malignant brain tumor ([place]) [C71.9] 07/18/2016  . Breast cancer metastasized to brain [facility]) [C50.919, C79.31] 07/18/2016  . Knee pain, left [M25.562] 07/18/2016  . Pressure injury of skin [L89.90] 07/11/2016  . Fall [W19.XXXA] 07/10/2016  . UTI (urinary tract infection) [N39.0] 07/10/2016  . Rhabdomyolysis [M62.82] 07/10/2016  . Syncope [R55] 07/10/2016  . Acute on chronic kidney failure-II [N17.9, N18.9] 07/10/2016  . Sepsis ([place]) [A41.9] 07/10/2016  . Urinary tract infection with hematuria [N39.0, R31.9]   . Brain tumor ([place]) [D49.6]   . Antineoplastic chemotherapy induced anemia [D64.81, T45.1X5A] 04/02/2015  . Weakness generalized [R53.1] 01/29/2015  . Dehydration [E86.0] 01/28/2015  . Rash [R21] 01/28/2015  . Chemotherapy induced diarrhea [K52.1, T45.1X5A] 01/08/2015  . Mucositis due to chemotherapy [K12.31] 01/08/2015  . Breast cancer of upper-outer quadrant of left female breast ([place]) [C50.412] 12/20/2014  . Well adult exam  [Z00.00] 10/26/2012  . Dyslipidemia [E78.5] 06/23/2011  . Hypertension [I10] 12/22/2010  . UPPER RESPIRATORY INFECTION, ACUTE [J06.9] 04/30/2010  . Anxiety [F41.9] 10/28/2009  . Vitamin D deficiency [E55.9] 04/24/2009  . HYPERGLYCEMIA [R73.09] 04/24/2009  . B12 deficiency [E53.8] 10/22/2008  . ALLERGIC RHINITIS [J30.9] 05/09/2007  . GERD [K21.9] 05/09/2007    Total Time spent with patient: 1 hour  Subjective:   Savannah Howard is a 71 y.o. female patient admitted with recent fall.  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 Patient stated she was in her normal state of health when she tripped over a pile of her adult depends on the floor and fell. She denied any loss of consciousness or preceding chest pain, palpitations, dizziness, changes in vision. Family reported they found patient on floor for unspecified amount of time Of note patient was last admitted in September for new partial seizures in setting of brain metastasis. She denied any similar symptoms on this presentation ( which she specified as no numbness in extremities or facial twitching) .  On evaluation: Patient appeared lying on her back and listening on phone. I have introduced myself as a psychiatrist and visiting her for capacity evaluation. Patient stated that she is not insane or dementia and also reported she knows what she has been talking all the while. Patient is able to give me the detailed history about her breast cancer and treatment which she has completed. Patient is also able to tell me her cardiologist's name has been working with her. Patient has  intact cognitions including orientation, concentration, immediate and delayed memory and language functions. Patient  seems to be somewhat upset because everybody she is talking telling her she needed to be in a safe place then her apartment and worried that people may not using her wishes. Patient stated she has secured enough funding for apartment until end of December. Patient has no toms of depression, anxiety, mania, psychosis and has no suicidal oon, intention  Past Psychiatric History: Denied  Risk to Self: Is patient at risk for suicide?: No Risk to Others:   Prior Inpatient Therapy:   Prior Outpatient Therapy:    Past Medical History:  Past Medical History:  Diagnosis Date  . 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 Allergy    rhinitis  . Anemia   . Antineoplastic chemotherapy induced anemia 04/02/2015  . Anxiety   . B12 deficiency 10/22/2008   Chronic    . Brain cancer ([place]) 06/2016   breast ca with mets to brain  . Brain tumor ([place])   . Breast cancer metastasized to brain [facility]) 07/18/2016   Dr Tammi Klippel - XRT Dr Lindi Adie Dr Ditty S/p craniotomy 12/17  . Breast cancer of upper-outer quadrant of left female breast ([place]) 12/20/2014  . Chemotherapy induced diarrhea 01/08/2015   x5 weeks   Questran Lomotil Consider a probiotic C diff stool test   . DDD (degenerative disc disease), lumbar dx'd in 1980's  . GERD 05/09/2007   Chronic     . GERD (gastroesophageal reflux disease)   . Heart murmur   . History of blood transfusion 2016 X 3 (05/30/2015)  . Hypertension   . Hypomagnesemia 01/07/2017  . Kidney stone 1990's X 1   "passed it"  . Knee pain, left 07/18/2016  . Malignant brain tumor ([place]) 07/18/2016  . Mucositis due to chemotherapy 01/08/2015  . Obesity   . Primary cancer of upper outer quadrant of left breast ([place]) dx'd 03/2015  . Primary malignant neoplasm of left breast with metastasis to other site [facility])   . Rhabdomyolysis 07/10/2016  . Seizure ([place])   . Seizure disorder ([place]) 01/07/2017   Due to brain mets On Keppra  . Syncope 07/10/2016  . Vitamin B 12 deficiency   . Vitamin D  deficiency   . Vitamin D deficiency 04/24/2009   Chronic       Past Surgical History:  Procedure Laterality Date  . APPLICATION OF CRANIAL NAVIGATION N/A 07/13/2016   Procedure: APPLICATION OF CRANIAL NAVIGATION;  Surgeon: Kevan Ny Ditty, MD;  Location: [place];  Service: Neurosurgery;  Laterality: N/A;  . BREAST BIOPSY Left ~ 12/2014  . CHOLECYSTECTOMY OPEN  ~ 1975  . ESOPHAGEAL DILATION  X 3  . MASTECTOMY COMPLETE / SIMPLE W/ SENTINEL NODE BIOPSY Left 05/30/2015  . MASTECTOMY W/ SENTINEL NODE BIOPSY Left 05/30/2015   Procedure: LEFT MASTECTOMY WITH SENTINEL LYMPH NODE BIOPSY;  Surgeon: Autumn Messing III, MD;  Location: [place];  Service: General;  Laterality: Left;  . PORTACATH PLACEMENT Right 12/26/2014   Procedure: INSERTION PORT-A-CATH;  Surgeon: Autumn Messing III, MD;  Location: [place];  Service: General;  Laterality: Right;  . PR DURAL GRAFT REPAIR,SPINE DEFECT N/A 07/13/2016   Procedure: Marshia Ly STERIOTACTIC BIOPSY WITH STEALTH;  Surgeon: Kevan Ny Ditty, MD;  Location: [place];  Service: Neurosurgery;  Laterality: N/A;   Family History:  Family History  Problem Relation Age of Onset  . Heart disease Mother        CAD  . COPD  Mother   . Heart disease Father 70       leaky valve  . Parkinsonism Father   . Hypertension Other    Family Psychiatric  History: Unknown Social History:  History  Alcohol Use  . Yes    Comment: 05/30/2015 "I might take a drink of wine 1-2 times/yr"     History  Drug Use No    Social History   Social History  . Marital status: Single    Spouse name: N/A  . Number of children: N/A  . Years of education: N/A   Occupational History  . retired Energy manager   Social History Main Topics  . Smoking status: Never Smoker  . Smokeless tobacco: Never Used  . Alcohol use Yes     Comment: 05/30/2015 "I might take a drink of wine 1-2 times/yr"  . Drug use: No  . Sexual activity: Not Currently   Other Topics Concern  .  None   Social History Narrative   Admitted to [facility] 04/22/17   Never married   Never smoked   Alcohol  Occasional wine   Full Code   Additional Social History:    Allergies:   Allergies  Allergen Reactions  . Penicillins Swelling and Other (See Comments)    Genital swelling/skin peeled/yeast infection Has patient had a PCN reaction causing immediate rash, facial/tongue/throat swelling, SOB or lightheadedness with hypotension: Yes Has patient had a PCN reaction causing severe rash involving mucus membranes or skin necrosis: Unknown Has patient had a PCN reaction that required hospitalization: Unknown Has patient had a PCN reaction occurring within the last 10 years: Unknown If all of the above answers are "NO", then may proceed with   . Sulfonamide Derivatives Other (See Comments)    Convulsions      Labs:  Results for orders placed or performed during the hospital encounter of 05/20/17 (from the past 48 hour(s))  Basic metabolic panel     Status: Abnormal   Collection Time: 05/24/17  3:12 AM  Result Value Ref Range   Sodium 136 135 - 145 mmol/L   Potassium 4.1 3.5 - 5.1 mmol/L    Comment: DELTA CHECK NOTED   Chloride 102 101 - 111 mmol/L   CO2 23 22 - 32 mmol/L   Glucose, Bld 137 (H) 65 - 99 mg/dL   BUN 19 6 - 20 mg/dL   Creatinine, Ser 1.01 (H) 0.44 - 1.00 mg/dL   Calcium 8.3 (L) 8.9 - 10.3 mg/dL   GFR calc non Af Amer 54 (L) >60 mL/min   GFR calc Af Amer >60 >60 mL/min    Comment: (NOTE) The eGFR has been calculated using the CKD EPI equation. This calculation has not been validated in all clinical situations. eGFR's persistently <60 mL/min signify possible Chronic Kidney Disease.    Anion gap 11 5 - 15  Basic metabolic panel     Status: Abnormal   Collection Time: 05/25/17  4:09 AM  Result Value Ref Range   Sodium 137 135 - 145 mmol/L   Potassium 3.8 3.5 - 5.1 mmol/L   Chloride 104 101 - 111 mmol/L   CO2 23 22 - 32 mmol/L   Glucose, Bld  103 (H) 65 - 99 mg/dL   BUN 18 6 - 20 mg/dL   Creatinine, Ser 1.05 (H) 0.44 - 1.00 mg/dL   Calcium 8.2 (L) 8.9 - 10.3 mg/dL   GFR calc non Af Amer 52 (L) >60 mL/min  GFR calc Af Amer 60 (L) >60 mL/min    Comment: (NOTE) The eGFR has been calculated using the CKD EPI equation. This calculation has not been validated in all clinical situations. eGFR's persistently <60 mL/min signify possible Chronic Kidney Disease.    Anion gap 10 5 - 15    Current Facility-Administered Medications  Medication Dose Route Frequency Provider Last Rate Last Dose  . acetaminophen (TYLENOL) tablet 650 mg  650 mg Oral Q4H PRN Haydee Monica, MD   650 mg at 05/24/17 2156  . aspirin EC tablet 81 mg  81 mg Oral Daily Bensimhon, Bevelyn Buckles, MD   81 mg at 05/25/17 0904  . atorvastatin (LIPITOR) tablet 20 mg  20 mg Oral q1800 Bensimhon, Bevelyn Buckles, MD   20 mg at 05/24/17 1752  . carvedilol (COREG) tablet 3.125 mg  3.125 mg Oral BID WC Bensimhon, Bevelyn Buckles, MD   3.125 mg at 05/25/17 0906  . cefUROXime (CEFTIN) tablet 500 mg  500 mg Oral BID WC Regalado, Belkys A, MD   500 mg at 05/25/17 0907  . lacosamide (VIMPAT) tablet 100 mg  100 mg Oral BID Audrea Muscat T, NP   100 mg at 05/25/17 0906  . levETIRAcetam (KEPPRA) tablet 500 mg  500 mg Oral BID Audrea Muscat T, NP   500 mg at 05/25/17 0903  . LORazepam (ATIVAN) tablet 0.5 mg  0.5 mg Oral TID PRN Regalado, Belkys A, MD   0.5 mg at 05/25/17 0014  . losartan (COZAAR) tablet 12.5 mg  12.5 mg Oral QHS Graciella Freer, PA-C   12.5 mg at 05/24/17 2156  . ondansetron (ZOFRAN) injection 4 mg  4 mg Intravenous Q6H PRN Haydee Monica, MD   4 mg at 05/24/17 2157  . spironolactone (ALDACTONE) tablet 12.5 mg  12.5 mg Oral Daily Bensimhon, Bevelyn Buckles, MD   12.5 mg at 05/25/17 0904    Musculoskeletal: Strength & Muscle Tone: decreased Gait & Station: unable to stand Patient leans: N/A  Psychiatric Specialty Exam: Physical Exam as per history and physical  ROS patient  denies current symptoms of nausea, vomiting, abdominal pain, shortness of breath and chest pain. No Fever-chills, No Headache, No changes with Vision or hearing, reports vertigo No problems swallowing food or Liquids, No Chest pain, Cough or Shortness of Breath, No Abdominal pain, No Nausea or Vommitting, Bowel movements are regular, No Blood in stool or Urine, No dysuria, No new skin rashes or bruises, No new joints pains-aches,  No new weakness, tingling, numbness in any extremity, No recent weight gain or loss, No polyuria, polydypsia or polyphagia,  A full 10 point Review of Systems was done, except as stated above, all other Review of Systems were negative.  Blood pressure (!) 131/51, pulse 78, temperature 98.4 F (36.9 C), temperature source Oral, resp. rate 18, height 5\' 2"  (1.575 m), weight 88.7 kg (195 lb 8.8 oz), SpO2 99 %.Body mass index is 35.77 kg/m.  General Appearance: Guarded, over weight  Eye Contact:  Good  Speech:  Clear and Coherent  Volume:  Normal  Mood:  Anxious  Affect:  Appropriate and Congruent  Thought Process:  Coherent and Goal Directed  Orientation:  Full (Time, Place, and Person)  Thought Content:  WDL  Suicidal Thoughts:  No  Homicidal Thoughts:  No  Memory:  Immediate;   Good Recent;   Fair Remote;   Fair  Judgement:  Intact  Insight:  Good  Psychomotor Activity:  Decreased  Concentration:  Concentration:  Good and Attention Span: Fair  Recall:  Good  Fund of Knowledge:  Good  Language:  Good  Akathisia:  Negative  Handed:  Right  AIMS (if indicated):     Assets:  Communication Skills Desire for Improvement Financial Resources/Insurance Housing Leisure Time Resilience Social Support Transportation  ADL's:  Intact  Cognition:  WNL  Sleep:        Treatment Plan Summary: 71 years old female with the history of breast cancer metastasis to the brain, recent history of seizure disorder and NSTEMI.  Patient has no history of  psychiatric illness and has no abnormal psychomotor activities and her cognitive functions are intact during my evaluation.  Recommendation: Based on my evaluation today patient meets criteria for capacity to make her own medical decisions and living arrangements.  Disposition: No evidence of imminent risk to self or others at present.   Supportive therapy provided about ongoing stressors.  Ambrose Finland, MD 05/25/2017 3:36 PM

## 2017-05-25 NOTE — Progress Notes (Signed)
PROGRESS NOTE  APPOLLONIA KLEE ACZ:[id number] DOB: 09/08/1945 DOA: 05/20/2017 PCP: [name], MD  HPI/Recap of past 24 hours: Mrs. 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  Patient stated she was in her normal state of health when she tripped over a pile of her adult depends on the floor and fell.  She denied any loss of consciousness or preceding chest pain, palpitations, dizziness, changes in vision.  Family reported they found patient on floor for unspecified amount of time Of note patient was last admitted in September for new partial seizures in setting of brain metastasis.  She denied any similar symptoms on this presentation ( which she specified as no numbness in extremities or facial twitching) .  This morning she states that she is doing well and continues to deny any chest pain or palpitations. Her appetite is normal. No abdominal pain or shortness of breath  Subjective;  She denies pain.  She is confused.  She wants to go back to her Motel.   Assessment/Plan:   NSTEMI -On admission troponin of 1.75 in asymptomatic patient with no ischemic changes; however, TTE showed severely reduced EF with concomitant severe hypokinesis consistent with ischemia in the distribution of the left anterior descending coronary artery.  - Patient has not received any anticoagulation given right scalp hematoma in context of her fall.  Patient does have metastatic brain lesions however these are reported as stable based off MRI imaging in August. -Given concerns from oncologist about medication adherence I would be hesitant to consider cath that may require stent and necessity of dual antiplatelet agents.  - f/u cardiology recommendations to determine  noninvasive vs invasive treatment -ECHO with lower EF than previous, EF now at 35 %.  -medical care. Aspirin, statin, coreg. Cozaar, spironolactone.    Unwitnessed Mechanical ground level Fall, unknown etiology Altered Mental Status, resolved Right Scalp hematoma Unclear etiology, could be related to  NSTEMI vs medications related. . - Social Work consult given living conditions. Needs more long term placement.  -resume home dose ativan, PRN.  PT consult.  Still confused.   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  Reports of medication non-adherence per friend who manages her pillbox -continue on home keppra , Vimpat  -per POA, Patient has not been taking medications as she should.   UTI; UA growing E coli. Change   Ceftriaxone to ceftin total 5 days.   Code Status: FULL  Family Communication: Spoke with friend/emergency contact Karis Juba. POA  Disposition Plan: need psych evaluation for capacity. And subsequent placement    Consultants:  Cardiology  Procedures:  TTE 05/21/17 EF 30-35% (markedly worsened from LV systolic function in August 2018) Severe hypokinesis of mid-apicalanteroseptal, anterior, inferior, and apical myocardium; consistent with ischemia in the distribution of the left anterior descending coronary artery  Antimicrobials:  None  DVT prophylaxis:  Mechanical ( SCDs)   Objective: Vitals:   05/24/17 1325 05/24/17 2040 05/25/17 0529 05/25/17 1346  BP: 118/64 (!) 142/72 125/67 (!) 131/51  Pulse: 86 81 72 78  Resp: 18 18 18 18   Temp: 98.4 F (36.9 C) 99.9 F (37.7 C) 98.3 F (36.8 C) 98.4 F (36.9 C)  TempSrc: Oral Oral Oral Oral  SpO2: 100%  100% 100% 99%  Weight:   88.7 kg (195 lb 8.8 oz)   Height:        Intake/Output Summary (Last 24 hours) at 05/25/17 1504 Last data filed at 05/25/17 1231  Gross per 24 hour  Intake              800 ml  Output             1350 ml  Net             -550 ml   Filed Weights   05/22/17 0418 05/23/17 0500 05/25/17 0529  Weight: 89.5 kg (197 lb 4.8 oz) 85.3 kg (188 lb) 88.7 kg (195 lb 8.8 oz)    Exam:   General: NAD  Cardiovascular: S 1, S 2, RRR  Respiratory: CTA  Abdomen: Soft, NT, ND  Neurologic: alert.   Skin: normal skin turgor    Data Reviewed: CBC:  Recent Labs Lab 05/20/17 1912 05/23/17 0257  WBC 13.8* 8.1  NEUTROABS 10.7*  --   HGB 13.7 12.0  HCT 40.3 37.2  MCV 100.2* 102.5*  PLT 224 466*   Basic Metabolic Panel:  Recent Labs Lab 05/20/17 1912 05/23/17 0257 05/24/17 0312 05/25/17 0409  NA 137 136 136 137  K 3.5 3.4* 4.1 3.8  CL 99* 102 102 104  CO2 26 25 23 23   GLUCOSE 127* 124* 137* 103*  BUN 25* 21* 19 18  CREATININE 1.13* 0.91 1.01* 1.05*  CALCIUM 8.7* 8.1* 8.3* 8.2*   GFR: Estimated Creatinine Clearance: 50.1 mL/min (A) (by C-G formula based on SCr of 1.05 mg/dL (H)). Liver Function Tests:  Recent Labs Lab 05/20/17 1912  AST 26  ALT 23  ALKPHOS 66  BILITOT 1.0  PROT 5.8*  ALBUMIN 3.1*   No results for input(s): LIPASE, AMYLASE in the last 168 hours. No results for input(s): AMMONIA in the last 168 hours. Coagulation Profile: No results for input(s): INR, PROTIME in the last 168 hours. Cardiac Enzymes:  Recent Labs Lab 05/20/17 1912 05/21/17 0348 05/21/17 0629  CKTOTAL 81  --   --   TROPONINI  --  2.29* 2.13*   BNP (last 3 results) No results for input(s): PROBNP in the last 8760 hours. HbA1C: No results for input(s): HGBA1C in the last 72 hours. CBG: No results for input(s): GLUCAP in the last 168 hours. Lipid Profile: No results for input(s): CHOL, HDL, LDLCALC, TRIG, CHOLHDL, LDLDIRECT in the last 72  hours. Thyroid Function Tests: No results for input(s): TSH, T4TOTAL, FREET4, T3FREE, THYROIDAB in the last 72 hours. Anemia Panel: No results for input(s): VITAMINB12, FOLATE, FERRITIN, TIBC, IRON, RETICCTPCT in the last 72 hours. Urine analysis:    Component Value Date/Time   COLORURINE YELLOW 05/21/2017 0136   APPEARANCEUR HAZY (A) 05/21/2017 0136   LABSPEC 1.015 05/21/2017 0136   PHURINE 6.0 05/21/2017 0136   GLUCOSEU NEGATIVE 05/21/2017 0136   GLUCOSEU NEGATIVE 11/05/2014 1456   HGBUR NEGATIVE 05/21/2017 0136   BILIRUBINUR NEGATIVE 05/21/2017 0136   KETONESUR NEGATIVE 05/21/2017 0136   PROTEINUR NEGATIVE 05/21/2017 0136   UROBILINOGEN 0.2 11/05/2014 1456   NITRITE NEGATIVE 05/21/2017 0136   LEUKOCYTESUR NEGATIVE 05/21/2017 0136   Sepsis Labs: @LABRCNTIP (procalcitonin:4,lacticidven:4)  ) Recent Results (from the past 240 hour(s))  Culture, Urine     Status:  Abnormal   Collection Time: 05/21/17  1:36 AM  Result Value Ref Range Status   Specimen Description URINE, RANDOM  Final   Special Requests NONE  Final   Culture >=100,000 COLONIES/mL ESCHERICHIA COLI (A)  Final   Report Status 05/23/2017 FINAL  Final   Organism ID, Bacteria ESCHERICHIA COLI (A)  Final      Susceptibility   Escherichia coli - MIC*    AMPICILLIN <=2 SENSITIVE Sensitive     CEFAZOLIN <=4 SENSITIVE Sensitive     CEFTRIAXONE <=1 SENSITIVE Sensitive     CIPROFLOXACIN <=0.25 SENSITIVE Sensitive     GENTAMICIN <=1 SENSITIVE Sensitive     IMIPENEM <=0.25 SENSITIVE Sensitive     NITROFURANTOIN <=16 SENSITIVE Sensitive     TRIMETH/SULFA <=20 SENSITIVE Sensitive     AMPICILLIN/SULBACTAM <=2 SENSITIVE Sensitive     PIP/TAZO <=4 SENSITIVE Sensitive     Extended ESBL NEGATIVE Sensitive     * >=100,000 COLONIES/mL ESCHERICHIA COLI      Studies: No results found.  Scheduled Meds: . aspirin EC  81 mg Oral Daily  . atorvastatin  20 mg Oral q1800  . carvedilol  3.125 mg Oral BID WC  . cefUROXime  500 mg  Oral BID WC  . lacosamide  100 mg Oral BID  . levETIRAcetam  500 mg Oral BID  . losartan  12.5 mg Oral QHS  . spironolactone  12.5 mg Oral Daily    Continuous Infusions:    LOS: 4 days     Elmarie Shiley, MD [facility] [phone]   If 7PM-7AM, please contact night-coverage [email or web address] Password [email or web address] 05/25/2017, 3:04 PM

## 2017-05-25 NOTE — Progress Notes (Signed)
CSW received consult for residential hospice placement. CSW confirmed with patient's POA, [name], that she prefers [facility]. CSW made referral to [facility]. CSW to follow and support with disposition planning.  Savannah Howard, [place]

## 2017-05-25 NOTE — Progress Notes (Signed)
Pt refused 0500 lab draw. Pt agitated and orientation is variable.

## 2017-05-25 NOTE — Care Management Important Message (Signed)
Important Message  Patient Details  Name: Savannah Howard MRN: [id number] Date of Birth: 1946-07-12   Medicare Important Message Given:  Yes    Nomar Broad [place] 05/25/2017, 11:21 AM

## 2017-05-25 NOTE — Consult Note (Signed)
[place]: Pt refused to talk with me after I let her know who I represented. She does not feel that she needs any help at home. She did not want to hear about our services that we could offer either at hospice home or at home care. Her wishes would be to go back to her hotel where she has lived for years now. I spoke with the [facility] about our services and the types of things we can offer to help them as well. I did speak to my medical director at [facility] home in [place] and unfortunately she does not feel that she is a candidate for GIP criteria at hospice home at this time. We do not have any residential beds available. The pcg her friend has been asked to come speak to the pt this evening when she gets off work to see if she could speak to her about hospice care and what we can offer since pt was refusing. If pt is not felt safe to go home then will need to look for alternative housing unless her condition changes and she starts meeting GIP criteria for our [place].  We will follow tomorrow and see how the conversation with the friend and pt goes regarding our services. Please call with any questions Webb Silversmith RN [phone]

## 2017-05-26 ENCOUNTER — Ambulatory Visit: Payer: Medicare Other | Admitting: Internal Medicine

## 2017-05-26 LAB — BASIC METABOLIC PANEL
Anion gap: 11 (ref 5–15)
BUN: 19 mg/dL (ref 6–20)
CALCIUM: 8.4 mg/dL — AB (ref 8.9–10.3)
CO2: 22 mmol/L (ref 22–32)
CREATININE: 1.06 mg/dL — AB (ref 0.44–1.00)
Chloride: 106 mmol/L (ref 101–111)
GFR calc non Af Amer: 51 mL/min — ABNORMAL LOW (ref >60)
GFR, EST AFRICAN AMERICAN: 59 mL/min — AB (ref >60)
Glucose, Bld: 108 mg/dL — ABNORMAL HIGH (ref 65–99)
Potassium: 4.9 mmol/L (ref 3.5–5.1)
SODIUM: 139 mmol/L (ref 135–145)

## 2017-05-26 MED ORDER — SPIRONOLACTONE 25 MG PO TABS: 12.5000 mg | ORAL_TABLET | Freq: Every day | ORAL | 0 refills | Status: AC

## 2017-05-26 MED ORDER — CARVEDILOL 3.125 MG PO TABS: 3.1250 mg | ORAL_TABLET | Freq: Two times a day (BID) | ORAL | 0 refills | Status: AC

## 2017-05-26 MED ORDER — ASPIRIN 81 MG PO TBEC: 81.0000 mg | DELAYED_RELEASE_TABLET | Freq: Every day | ORAL | 0 refills | Status: AC

## 2017-05-26 MED ORDER — CEFUROXIME AXETIL 500 MG PO TABS: 500.0000 mg | ORAL_TABLET | Freq: Two times a day (BID) | ORAL | 0 refills | Status: AC

## 2017-05-26 MED ORDER — HEPARIN SOD (PORK) LOCK FLUSH 100 UNIT/ML IV SOLN
500.0000 [IU] | INTRAVENOUS | Status: AC | PRN
  Administered 2017-05-26: 500 [IU]

## 2017-05-26 MED ORDER — ATORVASTATIN CALCIUM 20 MG PO TABS: 20.0000 mg | ORAL_TABLET | Freq: Every day | ORAL | 0 refills | Status: AC

## 2017-05-26 MED ORDER — LOSARTAN POTASSIUM 25 MG PO TABS: 12.5000 mg | ORAL_TABLET | Freq: Every day | ORAL | 0 refills | Status: AC

## 2017-05-26 NOTE — Care Management Note (Signed)
Case Management Note  Patient Details  Name: Savannah Howard MRN: [id number] Date of Birth: July 27, 1946  Subjective/Objective: Pt presented for Nstemi. PTA- had a fall at the [facility] [address] off [address] in [place]. Pt has never been married and no children. Pt has [place] [phone]. Savannah Howard goes to the pharmacy for patient and set her medications up weekly. Per Savannah Howard pt has not been compliant with taking medications. Pt has a microwave in the Motel that she uses to heat foods up. PTA- pt was active with [name] d/c her because she needed 24 hour supervision and they could not provide those Services. CSW for [name] wanted to make an APS Referral due to unsafe environment- Per agency and POA- pt is a hoarder and Savannah Howard has tried to clean the Motel up, however still cluttered. Psych Consulted and per MD pt has capacity to make her own decisions.                    Action/Plan: CM did speak with pt in regards to disposition needs- Per CSW pt was agreeable to [name]. At the time of visit- Pt states she will allow one person to come in and check on her. CM did ask if this consisted of [name] and pt states "No". "That consists of my neighbor coming in to check on me". Pt declined [name]. Pt asked CM to leave the room. Staff RN made aware that no services will be set up for the patient at this time. MD and POA was informed of choice of declining services. CM did contact CSW to see if she could make an APS Report. No further needs from CM at this time.    Expected Discharge Date:  05/23/17               Expected Discharge Plan:  Home/Self Care  In-House Referral:  Clinical Social Work  Discharge planning Services  CM Consult  Post Acute Care Choice:  NA Choice offered to:  NA  DME Arranged:  Patient refused services DME Agency:  NA  HH Arranged:  Patient Refused [name] Agency:  NA  Status of Service:   Completed, signed off  If discussed at [employer] of Stay Meetings, dates discussed:    Additional Comments:  Savannah Roys, RN 05/26/2017, 10:36 AM

## 2017-05-26 NOTE — Discharge Summary (Signed)
Physician Discharge Summary  Savannah Howard ASN:[id number] DOB: 04-20-1946 DOA: 05/20/2017  PCP: [name], MD  Admit date: 05/20/2017 Discharge date: 05/26/2017  Admitted From: Home  Disposition: Home, patient was evaluated by psych and was found to have capacity to make medical decisions. Patient refuse going to SNF, Residential hospice. SW, CM will follow with patient at discharge .  Recommendations for Outpatient Follow-up:  1. Follow up with PCP in 1-2 weeks   Home Health: yes, Sw, CM Discharge Condition:Stable.  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 Patient stated she was in her normal state of health when she tripped over a pile of her adult depends on the floor and fell. She denied any loss of consciousness or preceding chest pain, palpitations, dizziness, changes in vision. Family reported they found patient on floor for unspecified amount of time Of note patient was last admitted in September for new partial seizures in setting of brain metastasis. She denied any similar symptoms on this presentation ( which she specified as no numbness in extremities or facial twitching) .  This morning she states that she is doing well and continues to deny any chest pain or palpitations. Her appetite is normal. No abdominal pain or shortness of breath  Patient admitted with N-STEMI, confusion, UTI and after a fall. For N-STEMI plan is to treat medically. She is getting antibiotics for UTI. She was evaluated by Psych , per psych she has capacity.   Palliative care will follow up on patient today for disposition. Patient decline  residential hospice and or SNF. POA has been involve in case. Psychiatric was consulted and patient was thought to have capacity. Patient will be discharge to her home [facility]) , CM will follow up with patient at discharge.     Assessment/Plan:   NSTEMI -On admission troponin of 1.75 in asymptomatic patient with no ischemic changes; however, TTE showed severely reduced EF with concomitant severe hypokinesis consistent with ischemia in the distribution of theleft anterior descending coronary artery.  - Patient has not received any anticoagulation given right scalp hematoma in context of her fall.  Patient does have metastatic brain lesions however these are reported as stable based off MRI imaging in August. -Given concerns from oncologist about medication adherence I would be hesitant to consider cath that may require stent and necessity of dual antiplatelet agents.  - f/u cardiology recommendations to determine noninvasive vs invasive treatment -ECHO with lower EF than previous, EF now at 35 %.  -medical care. Aspirin, statin, coreg. Cozaar, spironolactone.    Unwitnessed Mechanical ground level Fall, unknown etiology Altered Mental Status, resolved Right Scalp hematoma Unclear etiology, could be related to  NSTEMI vs medications related. . - Social Work consult given living conditions. Needs more long term placement.  -resume home dose ativan, PRN.  PT consult. Needs SNF.    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  Reports of medication non-adherence per friend who manages her pillbox -  continue on home keppra , Vimpat  -per POA, Patient has not been taking medications as she  should.   UTI; UA growing E coli. Change   Ceftriaxone to ceftin total 5 days.    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   Breast cancer metastasized to brain [facility])   Altered mental status   Seizure disorder ([place])   Elevated troponin   Cardiomyopathy, ischemic    Discharge Instructions  Discharge Instructions    Diet - low sodium heart healthy    Complete by:  As directed    Diet - low sodium heart healthy    Complete by:  As directed    Increase activity slowly    Complete by:  As directed    Increase activity slowly    Complete by:  As directed      Allergies as of 05/26/2017      Reactions   Penicillins Swelling, Other (See Comments)   Genital swelling/skin peeled/yeast infection Has patient had a PCN reaction causing immediate rash, facial/tongue/throat swelling, SOB or lightheadedness with hypotension: Yes Has patient had a PCN reaction causing severe rash involving mucus membranes or skin necrosis: Unknown Has patient had a PCN reaction that required hospitalization: Unknown Has patient had a PCN reaction occurring within the last 10 years: Unknown If all of the above answers are "NO", then may proceed with    Sulfonamide Derivatives Other (See Comments)   Convulsions      Medication List    STOP taking these medications   dexamethasone 4 MG tablet Commonly known as:  DECADRON   senna 8.6 MG Tabs tablet Commonly known as:  SENOKOT     TAKE these medications   acetaminophen 500 MG tablet Commonly known as:  TYLENOL Take 1,000 mg by mouth at bedtime as needed for mild pain or headache.   aspirin 81 MG EC tablet Take 1 tablet (81 mg total) by mouth daily.   atorvastatin 20 MG tablet Commonly known as:  LIPITOR Take 1 tablet (20 mg total) by mouth daily at 6 PM.   carvedilol 3.125 MG tablet Commonly known as:  COREG Take 1  tablet (3.125 mg total) by mouth 2 (two) times daily with a meal.   cefUROXime 500 MG tablet Commonly known as:  CEFTIN Take 1 tablet (500 mg total) by mouth 2 (two) times daily with a meal.   cyanocobalamin 1000 MCG/ML injection Commonly known as:  (VITAMIN B-12) INJECT 1 ML SUBCUTANEOUSLY EVERY 30 DAYS   Lacosamide 100 MG Tabs Take 1 tablet (100 mg total) by mouth 2 (two) times daily.   levETIRAcetam 500 MG tablet Commonly known as:  KEPPRA Take 1 tablet (500 mg total) by mouth 2 (two) times daily.   LORazepam 0.5 MG tablet Commonly known as:  ATIVAN TAKE 1 TABLET BY MOUTH 3 TIMES A DAY AS NEEDED FOR ANXIETY   losartan 25 MG tablet Commonly known as:  COZAAR Take 0.5 tablets (12.5 mg total) by mouth at bedtime.   magnesium gluconate 500 MG tablet Commonly known as:  MAGONATE Take 1 tablet (500 mg total) by mouth 2 (two) times daily.   metoprolol succinate 50 MG 24 hr tablet Commonly known as:  TOPROL-XL TAKE 1/2 TABLET (25 MG TOTAL) BY MOUTH EVERY DAY   omeprazole 40 MG capsule Commonly known as:  PRILOSEC Take 1 capsule (40 mg total) by mouth daily.  ondansetron 8 MG tablet Commonly known as:  ZOFRAN Take 1 tablet (8 mg total) by mouth every 8 (eight) hours as needed for nausea or vomiting.   spironolactone 25 MG tablet Commonly known as:  ALDACTONE Take 0.5 tablets (12.5 mg total) by mouth daily.   Vitamin D3 2000 units capsule Take 1 capsule (2,000 Units total) by mouth daily.      Follow-up Information    [name], [name], MD Follow up.   Specialty:  Internal Medicine Contact information: [place] [address] [phone]        Nicholas Lose, MD Follow up.   Specialty:  Hematology and Oncology Contact information: [place] [address] [phone]          Allergies  Allergen Reactions  . Penicillins Swelling and Other (See Comments)    Genital swelling/skin peeled/yeast infection Has  patient had a PCN reaction causing immediate rash, facial/tongue/throat swelling, SOB or lightheadedness with hypotension: Yes Has patient had a PCN reaction causing severe rash involving mucus membranes or skin necrosis: Unknown Has patient had a PCN reaction that required hospitalization: Unknown Has patient had a PCN reaction occurring within the last 10 years: Unknown If all of the above answers are "NO", then may proceed with   . Sulfonamide Derivatives Other (See Comments)    Convulsions      Consultations:  Psych  Cardiology   Procedures/Studies: Ct Head Wo Contrast  Result Date: 05/20/2017 CLINICAL DATA:  71 year old female status post fall with right posterior scalp hematoma. Treated parenchymal and dural brain metastases from breast cancer. EXAM: CT HEAD WITHOUT CONTRAST CT CERVICAL SPINE WITHOUT CONTRAST TECHNIQUE: Multidetector CT imaging of the head and cervical spine was performed following the standard protocol without intravenous contrast. Multiplanar CT image reconstructions of the cervical spine were also generated. COMPARISON:  Brain MRI 04/18/2017 and earlier. Head CT without contrast 04/17/2017. CT cervical spine 07/10/2016. FINDINGS: CT HEAD FINDINGS Brain: Right temporal lobe resection cavity and treatment site appears stable. No intracranial mass effect. Stable and normal gray-white matter differentiation elsewhere. Incidental bulky dural calcification. No midline shift, ventriculomegaly, mass effect, intracranial hemorrhage or evidence of cortically based acute infarction. Vascular: Calcified atherosclerosis at the skull base. Skull: Previous right frontotemporal craniotomy. Chronic hyperostosis of the calvarium, normal variant. No acute osseous abnormality identified. Sinuses/Orbits: Stable. Other: Broad-based right posterior scalp contusion or hematoma (33). No underlying skull fracture. Stable scalp and orbits soft tissues otherwise. CT CERVICAL SPINE FINDINGS  Alignment: Stable cervical lordosis. Stable subtle anterolisthesis of C4 on C5. Cervicothoracic junction alignment is within normal limits. Bilateral posterior element alignment is within normal limits. Skull base and vertebrae: Visualized skull base is intact. No atlanto-occipital dissociation. No cervical spine fracture identified. Possible degenerative developing posterior element ankylosis at several levels. Soft tissues and spinal canal: No prevertebral fluid or swelling. No visible canal hematoma. Right IJ approach porta cath partially visible. Stable neck soft tissues. Disc levels: Mild for age cervical spine degeneration. Chronic disc and endplate degeneration at C5-C6 with left eccentric disc osteophyte complex in possible mild spinal stenosis. Upper chest: Visible upper thoracic levels appear intact. Negative lung apices. Mediastinal lipomatosis. IMPRESSION: 1. Right scalp hematoma without underlying fracture. 2.  Stable non contrast CT appearance of the brain since September. 3.  No acute fracture or listhesis in the cervical spine. Electronically Signed   By: Genevie Ann M.D.   On: 05/20/2017 21:08   Ct Cervical Spine Wo Contrast  Result Date: 05/20/2017  CLINICAL DATA:  71 year old female status post fall with right posterior scalp hematoma. Treated parenchymal and dural brain metastases from breast cancer. EXAM: CT HEAD WITHOUT CONTRAST CT CERVICAL SPINE WITHOUT CONTRAST TECHNIQUE: Multidetector CT imaging of the head and cervical spine was performed following the standard protocol without intravenous contrast. Multiplanar CT image reconstructions of the cervical spine were also generated. COMPARISON:  Brain MRI 04/18/2017 and earlier. Head CT without contrast 04/17/2017. CT cervical spine 07/10/2016. FINDINGS: CT HEAD FINDINGS Brain: Right temporal lobe resection cavity and treatment site appears stable. No intracranial mass effect. Stable and normal gray-white matter differentiation elsewhere.  Incidental bulky dural calcification. No midline shift, ventriculomegaly, mass effect, intracranial hemorrhage or evidence of cortically based acute infarction. Vascular: Calcified atherosclerosis at the skull base. Skull: Previous right frontotemporal craniotomy. Chronic hyperostosis of the calvarium, normal variant. No acute osseous abnormality identified. Sinuses/Orbits: Stable. Other: Broad-based right posterior scalp contusion or hematoma (33). No underlying skull fracture. Stable scalp and orbits soft tissues otherwise. CT CERVICAL SPINE FINDINGS Alignment: Stable cervical lordosis. Stable subtle anterolisthesis of C4 on C5. Cervicothoracic junction alignment is within normal limits. Bilateral posterior element alignment is within normal limits. Skull base and vertebrae: Visualized skull base is intact. No atlanto-occipital dissociation. No cervical spine fracture identified. Possible degenerative developing posterior element ankylosis at several levels. Soft tissues and spinal canal: No prevertebral fluid or swelling. No visible canal hematoma. Right IJ approach porta cath partially visible. Stable neck soft tissues. Disc levels: Mild for age cervical spine degeneration. Chronic disc and endplate degeneration at C5-C6 with left eccentric disc osteophyte complex in possible mild spinal stenosis. Upper chest: Visible upper thoracic levels appear intact. Negative lung apices. Mediastinal lipomatosis. IMPRESSION: 1. Right scalp hematoma without underlying fracture. 2.  Stable non contrast CT appearance of the brain since September. 3.  No acute fracture or listhesis in the cervical spine. Electronically Signed   By: Genevie Ann M.D.   On: 05/20/2017 21:08   Dg Pelvis Portable  Result Date: 05/20/2017 CLINICAL DATA:  71 year old female status post fall. EXAM: PORTABLE PELVIS 1-2 VIEWS COMPARISON:  07/09/2016. FINDINGS: Portable AP view at 1942 hours. Chronic dystrophic calcification about the greater trochanters,  more so the left. Femoral heads are normally located. Hip joint spaces appear stable. Proximal femurs appear grossly intact. Pelvis appears stable and intact. SI joints appear within normal limits. Negative visible bowel gas pattern. IMPRESSION: No acute fracture or dislocation identified about the pelvis. If there is lateralizing hip pain, then a dedicated hip series is recommended. Electronically Signed   By: Genevie Ann M.D.   On: 05/20/2017 20:09   Dg Chest Portable 1 View  Result Date: 05/20/2017 CLINICAL DATA:  Post fall. EXAM: PORTABLE CHEST 1 VIEW COMPARISON:  07/12/2016 FINDINGS: Right IJ Port-A-Cath unchanged. Lungs are adequately inflated and otherwise clear. Borderline stable cardiomegaly. Calcified plaque over the thoracic aorta. Diffuse osteopenia. Mild degenerate change of the spine. Surgical clips of the left axilla. IMPRESSION: No acute cardiopulmonary disease. Aortic Atherosclerosis (ICD10-I70.0). Electronically Signed   By: Marin Olp M.D.   On: 05/20/2017 20:08   Dg Tibia/fibula Right Port  Result Date: 05/20/2017 CLINICAL DATA:  71 year old female status post fall with midshaft right tibia bruise. EXAM: PORTABLE RIGHT TIBIA AND FIBULA - 2 VIEW COMPARISON:  None. FINDINGS: There is no evidence of fracture or other focal bone lesions. Degenerative changes at the right knee and ankle. Calcified peripheral vascular disease. Mild soft tissue swelling anterior to the tibia. IMPRESSION: No acute fracture or  dislocation identified about the right tib-fib. Electronically Signed   By: Genevie Ann M.D.   On: 05/20/2017 20:08       Discharge Exam: Vitals:   05/26/17 0850 05/26/17 1329  BP: (!) 126/51 139/69  Pulse: 77 76  Resp:    Temp:  98.2 F (36.8 C)  SpO2:     Vitals:   05/25/17 2055 05/26/17 0452 05/26/17 0850 05/26/17 1329  BP: 114/60 (!) 132/48 (!) 126/51 139/69  Pulse: 74 71 77 76  Resp: 16 16    Temp: 98.3 F (36.8 C) 98.4 F (36.9 C)  98.2 F (36.8 C)  TempSrc: Oral  Oral  Oral  SpO2: 98% 100%    Weight:  83.9 kg (184 lb 14.4 oz)    Height:        General: Pt is alert, awake, not in acute distress Cardiovascular: RRR, S1/S2 +, no rubs, no gallops Respiratory: CTA bilaterally, no wheezing, no rhonchi Abdominal: Soft, NT, ND, bowel sounds + Extremities: no edema, no cyanosis    The results of significant diagnostics from this hospitalization (including imaging, microbiology, ancillary and laboratory) are listed below for reference.     Microbiology: Recent Results (from the past 240 hour(s))  Culture, Urine     Status: Abnormal   Collection Time: 05/21/17  1:36 AM  Result Value Ref Range Status   Specimen Description URINE, RANDOM  Final   Special Requests NONE  Final   Culture >=100,000 COLONIES/mL ESCHERICHIA COLI (A)  Final   Report Status 05/23/2017 FINAL  Final   Organism ID, Bacteria ESCHERICHIA COLI (A)  Final      Susceptibility   Escherichia coli - MIC*    AMPICILLIN <=2 SENSITIVE Sensitive     CEFAZOLIN <=4 SENSITIVE Sensitive     CEFTRIAXONE <=1 SENSITIVE Sensitive     CIPROFLOXACIN <=0.25 SENSITIVE Sensitive     GENTAMICIN <=1 SENSITIVE Sensitive     IMIPENEM <=0.25 SENSITIVE Sensitive     NITROFURANTOIN <=16 SENSITIVE Sensitive     TRIMETH/SULFA <=20 SENSITIVE Sensitive     AMPICILLIN/SULBACTAM <=2 SENSITIVE Sensitive     PIP/TAZO <=4 SENSITIVE Sensitive     Extended ESBL NEGATIVE Sensitive     * >=100,000 COLONIES/mL ESCHERICHIA COLI     Labs: BNP (last 3 results)  Recent Labs  07/10/16 0420  BNP 700.1*   Basic Metabolic Panel:  Recent Labs Lab 05/20/17 1912 05/23/17 0257 05/24/17 0312 05/25/17 0409 05/26/17 0508  NA 137 136 136 137 139  K 3.5 3.4* 4.1 3.8 4.9  CL 99* 102 102 104 106  CO2 26 25 23 23 22   GLUCOSE 127* 124* 137* 103* 108*  BUN 25* 21* 19 18 19   CREATININE 1.13* 0.91 1.01* 1.05* 1.06*  CALCIUM 8.7* 8.1* 8.3* 8.2* 8.4*   Liver Function Tests:  Recent Labs Lab 05/20/17 1912  AST 26   ALT 23  ALKPHOS 66  BILITOT 1.0  PROT 5.8*  ALBUMIN 3.1*   No results for input(s): LIPASE, AMYLASE in the last 168 hours. No results for input(s): AMMONIA in the last 168 hours. CBC:  Recent Labs Lab 05/20/17 1912 05/23/17 0257  WBC 13.8* 8.1  NEUTROABS 10.7*  --   HGB 13.7 12.0  HCT 40.3 37.2  MCV 100.2* 102.5*  PLT 224 140*   Cardiac Enzymes:  Recent Labs Lab 05/20/17 1912 05/21/17 0348 05/21/17 0629  CKTOTAL 81  --   --   TROPONINI  --  2.29* 2.13*   BNP: Invalid input(s):  POCBNP CBG: No results for input(s): GLUCAP in the last 168 hours. D-Dimer No results for input(s): DDIMER in the last 72 hours. Hgb A1c No results for input(s): HGBA1C in the last 72 hours. Lipid Profile No results for input(s): CHOL, HDL, LDLCALC, TRIG, CHOLHDL, LDLDIRECT in the last 72 hours. Thyroid function studies No results for input(s): TSH, T4TOTAL, T3FREE, THYROIDAB in the last 72 hours.  Invalid input(s): FREET3 Anemia work up No results for input(s): VITAMINB12, FOLATE, FERRITIN, TIBC, IRON, RETICCTPCT in the last 72 hours. Urinalysis    Component Value Date/Time   COLORURINE YELLOW 05/21/2017 0136   APPEARANCEUR HAZY (A) 05/21/2017 0136   LABSPEC 1.015 05/21/2017 0136   PHURINE 6.0 05/21/2017 0136   GLUCOSEU NEGATIVE 05/21/2017 0136   GLUCOSEU NEGATIVE 11/05/2014 1456   HGBUR NEGATIVE 05/21/2017 0136   BILIRUBINUR NEGATIVE 05/21/2017 0136   KETONESUR NEGATIVE 05/21/2017 0136   PROTEINUR NEGATIVE 05/21/2017 0136   UROBILINOGEN 0.2 11/05/2014 1456   NITRITE NEGATIVE 05/21/2017 0136   LEUKOCYTESUR NEGATIVE 05/21/2017 0136   Sepsis Labs Invalid input(s): PROCALCITONIN,  WBC,  LACTICIDVEN Microbiology Recent Results (from the past 240 hour(s))  Culture, Urine     Status: Abnormal   Collection Time: 05/21/17  1:36 AM  Result Value Ref Range Status   Specimen Description URINE, RANDOM  Final   Special Requests NONE  Final   Culture >=100,000 COLONIES/mL  ESCHERICHIA COLI (A)  Final   Report Status 05/23/2017 FINAL  Final   Organism ID, Bacteria ESCHERICHIA COLI (A)  Final      Susceptibility   Escherichia coli - MIC*    AMPICILLIN <=2 SENSITIVE Sensitive     CEFAZOLIN <=4 SENSITIVE Sensitive     CEFTRIAXONE <=1 SENSITIVE Sensitive     CIPROFLOXACIN <=0.25 SENSITIVE Sensitive     GENTAMICIN <=1 SENSITIVE Sensitive     IMIPENEM <=0.25 SENSITIVE Sensitive     NITROFURANTOIN <=16 SENSITIVE Sensitive     TRIMETH/SULFA <=20 SENSITIVE Sensitive     AMPICILLIN/SULBACTAM <=2 SENSITIVE Sensitive     PIP/TAZO <=4 SENSITIVE Sensitive     Extended ESBL NEGATIVE Sensitive     * >=100,000 COLONIES/mL ESCHERICHIA COLI     Time coordinating discharge: Over 30 minutes  SIGNED:   Elmarie Shiley, MD  [facility] 05/26/2017, 4:08 PM Pager   If 7PM-7AM, please contact night-coverage [email or web address] Password [email or web address]

## 2017-05-26 NOTE — Progress Notes (Signed)
PROGRESS NOTE  SHARNESE Howard HQI:[id number] DOB: [birth year] DOA: 05/20/2017 PCP: [name], MD  HPI/Recap of past 24 hours: 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  Patient stated she was in her normal state of health when she tripped over a pile of her adult depends on the floor and fell.  She denied any loss of consciousness or preceding chest pain, palpitations, dizziness, changes in vision.  Family reported they found patient on floor for unspecified amount of time Of note patient was last admitted in September for new partial seizures in setting of brain metastasis.  She denied any similar symptoms on this presentation ( which she specified as no numbness in extremities or facial twitching) .  This morning she states that she is doing well and continues to deny any chest pain or palpitations. Her appetite is normal. No abdominal pain or shortness of breath  Patient admitted with N-STEMI, confusion, UTI and after a fall. For N-STEMI plan is to treat medically. She is getting antibiotics for UTI. She was evaluated by Psych , per psych she has capacity.   Palliative care will follow up on patient today for disposition. Patient decline residential hospice and or SNF. POA has been involve in case.   Subjective;  She thought that she fell in the hospital, but  She fell prior to admission.  She told me the psychiatrist was here and he said I have capacity.   Assessment/Plan:   NSTEMI -On admission troponin of 1.75 in asymptomatic patient with no ischemic changes; however, TTE showed severely reduced EF with concomitant severe hypokinesis consistent with ischemia in the distribution of the left anterior descending coronary artery.  -  Patient has not received any anticoagulation given right scalp hematoma in context of her fall.  Patient does have metastatic brain lesions however these are reported as stable based off MRI imaging in August. -Given concerns from oncologist about medication adherence I would be hesitant to consider cath that may require stent and necessity of dual antiplatelet agents.  - f/u cardiology recommendations to determine noninvasive vs invasive treatment -ECHO with lower EF than previous, EF now at 35 %.  -medical care. Aspirin, statin, coreg. Cozaar, spironolactone.    Unwitnessed Mechanical ground level Fall, unknown etiology Altered Mental Status, resolved Right Scalp hematoma Unclear etiology, could be related to  NSTEMI vs medications related. . - Social Work consult given living conditions. Needs more long term placement.  -resume home dose ativan, PRN.  PT consult. Needs SNF.    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  Reports of medication non-adherence per friend who manages her pillbox -continue on home keppra , Vimpat  -per POA, Patient has not been taking medications as she should.   UTI; UA growing E coli. Change   Ceftriaxone to ceftin total 5 days.   Code Status: FULL  Family Communication: Spoke with friend/emergency contact Karis Juba. POA  Disposition Plan: need psych evaluation for capacity. And subsequent placement    Consultants:  Cardiology  Procedures:  TTE 05/21/17 EF 30-35% (markedly worsened from LV systolic function in August 2018) Severe hypokinesis of mid-apicalanteroseptal, anterior, inferior, and apical myocardium; consistent with ischemia in the  distribution of the left anterior descending coronary artery  Antimicrobials:  None  DVT prophylaxis:  Mechanical ( SCDs)   Objective: Vitals:   05/25/17 2055 05/26/17 0452 05/26/17 0850 05/26/17 1329  BP: 114/60 (!) 132/48 (!) 126/51 139/69  Pulse: 74 71 77 76  Resp: 16 16    Temp: 98.3 F (36.8 C) 98.4 F (36.9 C)  98.2 F (36.8 C)  TempSrc: Oral Oral  Oral  SpO2: 98% 100%    Weight:  83.9 kg (184 lb 14.4 oz)    Height:        Intake/Output Summary (Last 24 hours) at 05/26/17 1345 Last data filed at 05/26/17 0800  Gross per 24 hour  Intake              600 ml  Output                0 ml  Net              600 ml   Filed Weights   05/23/17 0500 05/25/17 0529 05/26/17 0452  Weight: 85.3 kg (188 lb) 88.7 kg (195 lb 8.8 oz) 83.9 kg (184 lb 14.4 oz)    Exam:   General: NAD, occipital hematoma  Cardiovascular: S 1, S 2 RRR  Respiratory: CTA  Abdomen: Soft, nt  Neurologic: alert.   Skin: normal skin turgor    Data Reviewed: CBC:  Recent Labs Lab 05/20/17 1912 05/23/17 0257  WBC 13.8* 8.1  NEUTROABS 10.7*  --   HGB 13.7 12.0  HCT 40.3 37.2  MCV 100.2* 102.5*  PLT 224 947*   Basic Metabolic Panel:  Recent Labs Lab 05/20/17 1912 05/23/17 0257 05/24/17 0312 05/25/17 0409 05/26/17 0508  NA 137 136 136 137 139  K 3.5 3.4* 4.1 3.8 4.9  CL 99* 102 102 104 106  CO2 26 25 23 23 22   GLUCOSE 127* 124* 137* 103* 108*  BUN 25* 21* 19 18 19   CREATININE 1.13* 0.91 1.01* 1.05* 1.06*  CALCIUM 8.7* 8.1* 8.3* 8.2* 8.4*   GFR: Estimated Creatinine Clearance: 48.2 mL/min (A) (by C-G formula based on SCr of 1.06 mg/dL (H)). Liver Function Tests:  Recent Labs Lab 05/20/17 1912  AST 26  ALT 23  ALKPHOS 66  BILITOT 1.0  PROT 5.8*  ALBUMIN 3.1*   No results for input(s): LIPASE, AMYLASE in the last 168 hours. No results for input(s): AMMONIA in the last 168 hours. Coagulation Profile: No results for input(s): INR, PROTIME in the last 168  hours. Cardiac Enzymes:  Recent Labs Lab 05/20/17 1912 05/21/17 0348 05/21/17 0629  CKTOTAL 81  --   --   TROPONINI  --  2.29* 2.13*   BNP (last 3 results) No results for input(s): PROBNP in the last 8760 hours. HbA1C: No results for input(s): HGBA1C in the last 72 hours. CBG: No results for input(s): GLUCAP in the last 168 hours. Lipid Profile: No results for input(s): CHOL, HDL, LDLCALC, TRIG, CHOLHDL, LDLDIRECT in the last 72 hours. Thyroid Function Tests: No results for input(s): TSH, T4TOTAL, FREET4, T3FREE, THYROIDAB in the last 72 hours. Anemia Panel: No results for input(s): VITAMINB12, FOLATE, FERRITIN, TIBC, IRON, RETICCTPCT in the last 72 hours. Urine analysis:    Component Value Date/Time   COLORURINE YELLOW 05/21/2017 0136   APPEARANCEUR HAZY (A) 05/21/2017 0136  LABSPEC 1.015 05/21/2017 0136   PHURINE 6.0 05/21/2017 0136   GLUCOSEU NEGATIVE 05/21/2017 0136   GLUCOSEU NEGATIVE 11/05/2014 1456   HGBUR NEGATIVE 05/21/2017 0136   BILIRUBINUR NEGATIVE 05/21/2017 0136   KETONESUR NEGATIVE 05/21/2017 0136   PROTEINUR NEGATIVE 05/21/2017 0136   UROBILINOGEN 0.2 11/05/2014 1456   NITRITE NEGATIVE 05/21/2017 0136   LEUKOCYTESUR NEGATIVE 05/21/2017 0136   Sepsis Labs: @LABRCNTIP (procalcitonin:4,lacticidven:4)  ) Recent Results (from the past 240 hour(s))  Culture, Urine     Status: Abnormal   Collection Time: 05/21/17  1:36 AM  Result Value Ref Range Status   Specimen Description URINE, RANDOM  Final   Special Requests NONE  Final   Culture >=100,000 COLONIES/mL ESCHERICHIA COLI (A)  Final   Report Status 05/23/2017 FINAL  Final   Organism ID, Bacteria ESCHERICHIA COLI (A)  Final      Susceptibility   Escherichia coli - MIC*    AMPICILLIN <=2 SENSITIVE Sensitive     CEFAZOLIN <=4 SENSITIVE Sensitive     CEFTRIAXONE <=1 SENSITIVE Sensitive     CIPROFLOXACIN <=0.25 SENSITIVE Sensitive     GENTAMICIN <=1 SENSITIVE Sensitive     IMIPENEM <=0.25 SENSITIVE  Sensitive     NITROFURANTOIN <=16 SENSITIVE Sensitive     TRIMETH/SULFA <=20 SENSITIVE Sensitive     AMPICILLIN/SULBACTAM <=2 SENSITIVE Sensitive     PIP/TAZO <=4 SENSITIVE Sensitive     Extended ESBL NEGATIVE Sensitive     * >=100,000 COLONIES/mL ESCHERICHIA COLI      Studies: No results found.  Scheduled Meds: . aspirin EC  81 mg Oral Daily  . atorvastatin  20 mg Oral q1800  . carvedilol  3.125 mg Oral BID WC  . cefUROXime  500 mg Oral BID WC  . lacosamide  100 mg Oral BID  . levETIRAcetam  500 mg Oral BID  . losartan  12.5 mg Oral QHS  . spironolactone  12.5 mg Oral Daily    Continuous Infusions:    LOS: 5 days     Elmarie Shiley, MD [facility] [phone]   If 7PM-7AM, please contact night-coverage [email or web address] Password [email or web address] 05/26/2017, 1:45 PM

## 2017-05-26 NOTE — Progress Notes (Signed)
CSW reviewed chart and noted psychiatric consult. Psych determined patient has capacity to make decisions. CSW met with patient at bedside and discussed disposition planning. CSW reviewed possible disposition plans discussed with medical team and POA. Patient adamantly declines SNF and residential hospice and wants to go home to her hotel room, where she has lived for several years.   CSW reflected on patient's mobility as documented in PT notes. CSW asked patient about her ability to complete ADLs independently. Patient reported she can cook, bathe, and toilet on her own. CSW clarified recommendations for SNF and discussed copays. Patient indicated she has no other insurance coverage and will not pay the copays for SNF.   Patient with concern that CSW would still make her go to facility. CSW reinforced that psychiatry had determined she has capacity and CSW does not have authority to make decisions for patient.   Patient agreeable to home hospice services. CSW spoke to patient's POA, Karis Juba via phone and updated POA on discussion with patient and psych's determination of capacity. POA with concern about patient going home and would prefer facility, however POA with understanding of patient's right to make decisions.  CSW referred to [facility] for home hospice needs. CSW signing off.  Estanislado Emms, [place]

## 2017-05-26 NOTE — Consult Note (Signed)
[place]: TC to speak to pt;s PCG or friend to see if she awas able to talk to the pt about hospice care and see if she would be more receptive now. She stated she did talk with her and she originally agreed to let us come talk to her today. However the pt has changed her mind now and prefers that hospice not be involved. So the cg is going to just take her home as she wishes when discharged. She thanked me for coming and trying to offer services. I did let her friend know that if she gets home and changes her mid we will be happy to come out after she gets home and talk with them as well. Webb Silversmith RN

## 2017-05-26 NOTE — Progress Notes (Signed)
PORT flushed per protocol and no blood return noted. Repositioned patient and had patient cough, repeated several time without success. Removed cap and flushed repeating position changes without blood return. Flushed with heparin let sit, no blood return. Discussed with patient regarding instilling TPA. Pt declined at this time. Pt will go to chemo clinic next week. Instructed to notify them. Pt VU. [place]

## 2017-05-26 NOTE — Progress Notes (Signed)
PMT progress note  Patient seen and examined, she is resting in bed, she is in no distress, is awake alert.  She is able to state her name, year, why she is in the hospital, her room number, current president.   Patient states she will not go to SNF, she will not go to hospice house. She is aware of hospice philosophy of care.   BP 139/69 (BP Location: Right Wrist)   Pulse 76   Temp 98.2 F (36.8 C) (Oral)   Resp 16   Ht 5\' 2"  (1.575 m)   Wt 83.9 kg (184 lb 14.4 oz)   SpO2 100%   BMI 33.82 kg/m  Labs and imaging noted.  Chart also reviewed in detail  Awake alert Has generalized edema Regular S1 S2 Abdomen distended Awake alert oriented  A/P: Recent NSTEMI Breast cancer Has brain mets HTN  PLAN: Patient wishes to go home at this point. Psych has deemed that she has capacity.  Discussed with case manager Hassan Rowan as well as patient's bedside RN.   Recommend Dr Lindi Adie follow up with patient and continue to impress upon her about how hospice services can be an extra layer of support for her.   35 minutes spent.  Loistine Chance MD [phone]  [facility] palliative medicine team

## 2017-05-28 ENCOUNTER — Telehealth: Payer: Self-pay

## 2017-05-28 ENCOUNTER — Telehealth: Payer: Self-pay | Admitting: Internal Medicine

## 2017-05-28 ENCOUNTER — Telehealth: Payer: Self-pay | Admitting: Medical Oncology

## 2017-05-28 DIAGNOSIS — Z853 Personal history of malignant neoplasm of breast: Secondary | ICD-10-CM | POA: Diagnosis not present

## 2017-05-28 DIAGNOSIS — I214 Non-ST elevation (NSTEMI) myocardial infarction: Secondary | ICD-10-CM | POA: Diagnosis not present

## 2017-05-28 DIAGNOSIS — Z9012 Acquired absence of left breast and nipple: Secondary | ICD-10-CM | POA: Diagnosis not present

## 2017-05-28 DIAGNOSIS — Z452 Encounter for adjustment and management of vascular access device: Secondary | ICD-10-CM | POA: Diagnosis not present

## 2017-05-28 DIAGNOSIS — G40909 Epilepsy, unspecified, not intractable, without status epilepticus: Secondary | ICD-10-CM | POA: Diagnosis not present

## 2017-05-28 DIAGNOSIS — I5020 Unspecified systolic (congestive) heart failure: Secondary | ICD-10-CM | POA: Diagnosis not present

## 2017-05-28 DIAGNOSIS — C7931 Secondary malignant neoplasm of brain: Secondary | ICD-10-CM | POA: Diagnosis not present

## 2017-05-28 NOTE — Telephone Encounter (Signed)
Hospice called stating the patient would like to enroll in hospice and and would like to know if Plonitov will be the attending

## 2017-05-28 NOTE — Telephone Encounter (Signed)
I can VG

## 2017-05-28 NOTE — Telephone Encounter (Signed)
Referred to hospice while pt in hospital and pt wants hospice. Will Dr Lindi Adie serve as attending?

## 2017-05-28 NOTE — Telephone Encounter (Signed)
Pt is on TCM list.  Pt has been referred to Hospice. Will defer TCM call at this time.

## 2017-05-28 NOTE — Telephone Encounter (Signed)
Manus Gunning notified [name] will be attending physician

## 2017-05-28 NOTE — Telephone Encounter (Signed)
Called Savannah Howard that Savannah Howard will be attending.

## 2017-05-30 DIAGNOSIS — Z853 Personal history of malignant neoplasm of breast: Secondary | ICD-10-CM | POA: Diagnosis not present

## 2017-05-30 DIAGNOSIS — I5020 Unspecified systolic (congestive) heart failure: Secondary | ICD-10-CM | POA: Diagnosis not present

## 2017-05-30 DIAGNOSIS — G40909 Epilepsy, unspecified, not intractable, without status epilepticus: Secondary | ICD-10-CM | POA: Diagnosis not present

## 2017-05-30 DIAGNOSIS — Z9012 Acquired absence of left breast and nipple: Secondary | ICD-10-CM | POA: Diagnosis not present

## 2017-05-30 DIAGNOSIS — C7931 Secondary malignant neoplasm of brain: Secondary | ICD-10-CM | POA: Diagnosis not present

## 2017-05-30 DIAGNOSIS — I214 Non-ST elevation (NSTEMI) myocardial infarction: Secondary | ICD-10-CM | POA: Diagnosis not present

## 2017-05-31 DIAGNOSIS — Z853 Personal history of malignant neoplasm of breast: Secondary | ICD-10-CM | POA: Diagnosis not present

## 2017-05-31 DIAGNOSIS — C7931 Secondary malignant neoplasm of brain: Secondary | ICD-10-CM | POA: Diagnosis not present

## 2017-05-31 DIAGNOSIS — Z9012 Acquired absence of left breast and nipple: Secondary | ICD-10-CM | POA: Diagnosis not present

## 2017-05-31 DIAGNOSIS — I214 Non-ST elevation (NSTEMI) myocardial infarction: Secondary | ICD-10-CM | POA: Diagnosis not present

## 2017-05-31 DIAGNOSIS — I5020 Unspecified systolic (congestive) heart failure: Secondary | ICD-10-CM | POA: Diagnosis not present

## 2017-05-31 DIAGNOSIS — G40909 Epilepsy, unspecified, not intractable, without status epilepticus: Secondary | ICD-10-CM | POA: Diagnosis not present

## 2017-06-01 DIAGNOSIS — Z853 Personal history of malignant neoplasm of breast: Secondary | ICD-10-CM | POA: Diagnosis not present

## 2017-06-01 DIAGNOSIS — I214 Non-ST elevation (NSTEMI) myocardial infarction: Secondary | ICD-10-CM | POA: Diagnosis not present

## 2017-06-01 DIAGNOSIS — C7931 Secondary malignant neoplasm of brain: Secondary | ICD-10-CM | POA: Diagnosis not present

## 2017-06-01 DIAGNOSIS — I5020 Unspecified systolic (congestive) heart failure: Secondary | ICD-10-CM | POA: Diagnosis not present

## 2017-06-01 DIAGNOSIS — Z9012 Acquired absence of left breast and nipple: Secondary | ICD-10-CM | POA: Diagnosis not present

## 2017-06-01 DIAGNOSIS — G40909 Epilepsy, unspecified, not intractable, without status epilepticus: Secondary | ICD-10-CM | POA: Diagnosis not present

## 2017-06-02 DIAGNOSIS — G40909 Epilepsy, unspecified, not intractable, without status epilepticus: Secondary | ICD-10-CM | POA: Diagnosis not present

## 2017-06-02 DIAGNOSIS — C7931 Secondary malignant neoplasm of brain: Secondary | ICD-10-CM | POA: Diagnosis not present

## 2017-06-02 DIAGNOSIS — I5020 Unspecified systolic (congestive) heart failure: Secondary | ICD-10-CM | POA: Diagnosis not present

## 2017-06-02 DIAGNOSIS — Z853 Personal history of malignant neoplasm of breast: Secondary | ICD-10-CM | POA: Diagnosis not present

## 2017-06-02 DIAGNOSIS — Z9012 Acquired absence of left breast and nipple: Secondary | ICD-10-CM | POA: Diagnosis not present

## 2017-06-02 DIAGNOSIS — I214 Non-ST elevation (NSTEMI) myocardial infarction: Secondary | ICD-10-CM | POA: Diagnosis not present

## 2017-06-03 DIAGNOSIS — Z9012 Acquired absence of left breast and nipple: Secondary | ICD-10-CM | POA: Diagnosis not present

## 2017-06-03 DIAGNOSIS — G40909 Epilepsy, unspecified, not intractable, without status epilepticus: Secondary | ICD-10-CM | POA: Diagnosis not present

## 2017-06-03 DIAGNOSIS — I214 Non-ST elevation (NSTEMI) myocardial infarction: Secondary | ICD-10-CM | POA: Diagnosis not present

## 2017-06-03 DIAGNOSIS — Z853 Personal history of malignant neoplasm of breast: Secondary | ICD-10-CM | POA: Diagnosis not present

## 2017-06-03 DIAGNOSIS — I5020 Unspecified systolic (congestive) heart failure: Secondary | ICD-10-CM | POA: Diagnosis not present

## 2017-06-03 DIAGNOSIS — C7931 Secondary malignant neoplasm of brain: Secondary | ICD-10-CM | POA: Diagnosis not present

## 2017-06-04 DIAGNOSIS — G40909 Epilepsy, unspecified, not intractable, without status epilepticus: Secondary | ICD-10-CM | POA: Diagnosis not present

## 2017-06-04 DIAGNOSIS — Z9012 Acquired absence of left breast and nipple: Secondary | ICD-10-CM | POA: Diagnosis not present

## 2017-06-04 DIAGNOSIS — I5020 Unspecified systolic (congestive) heart failure: Secondary | ICD-10-CM | POA: Diagnosis not present

## 2017-06-04 DIAGNOSIS — Z853 Personal history of malignant neoplasm of breast: Secondary | ICD-10-CM | POA: Diagnosis not present

## 2017-06-04 DIAGNOSIS — I214 Non-ST elevation (NSTEMI) myocardial infarction: Secondary | ICD-10-CM | POA: Diagnosis not present

## 2017-06-04 DIAGNOSIS — C7931 Secondary malignant neoplasm of brain: Secondary | ICD-10-CM | POA: Diagnosis not present

## 2017-06-07 DIAGNOSIS — I214 Non-ST elevation (NSTEMI) myocardial infarction: Secondary | ICD-10-CM | POA: Diagnosis not present

## 2017-06-07 DIAGNOSIS — Z853 Personal history of malignant neoplasm of breast: Secondary | ICD-10-CM | POA: Diagnosis not present

## 2017-06-07 DIAGNOSIS — Z9012 Acquired absence of left breast and nipple: Secondary | ICD-10-CM | POA: Diagnosis not present

## 2017-06-07 DIAGNOSIS — G40909 Epilepsy, unspecified, not intractable, without status epilepticus: Secondary | ICD-10-CM | POA: Diagnosis not present

## 2017-06-07 DIAGNOSIS — I5020 Unspecified systolic (congestive) heart failure: Secondary | ICD-10-CM | POA: Diagnosis not present

## 2017-06-07 DIAGNOSIS — C7931 Secondary malignant neoplasm of brain: Secondary | ICD-10-CM | POA: Diagnosis not present

## 2017-06-08 DIAGNOSIS — Z853 Personal history of malignant neoplasm of breast: Secondary | ICD-10-CM | POA: Diagnosis not present

## 2017-06-08 DIAGNOSIS — Z9012 Acquired absence of left breast and nipple: Secondary | ICD-10-CM | POA: Diagnosis not present

## 2017-06-08 DIAGNOSIS — I5020 Unspecified systolic (congestive) heart failure: Secondary | ICD-10-CM | POA: Diagnosis not present

## 2017-06-08 DIAGNOSIS — I214 Non-ST elevation (NSTEMI) myocardial infarction: Secondary | ICD-10-CM | POA: Diagnosis not present

## 2017-06-08 DIAGNOSIS — G40909 Epilepsy, unspecified, not intractable, without status epilepticus: Secondary | ICD-10-CM | POA: Diagnosis not present

## 2017-06-08 DIAGNOSIS — C7931 Secondary malignant neoplasm of brain: Secondary | ICD-10-CM | POA: Diagnosis not present

## 2017-06-09 DIAGNOSIS — G40909 Epilepsy, unspecified, not intractable, without status epilepticus: Secondary | ICD-10-CM | POA: Diagnosis not present

## 2017-06-09 DIAGNOSIS — I5020 Unspecified systolic (congestive) heart failure: Secondary | ICD-10-CM | POA: Diagnosis not present

## 2017-06-09 DIAGNOSIS — I214 Non-ST elevation (NSTEMI) myocardial infarction: Secondary | ICD-10-CM | POA: Diagnosis not present

## 2017-06-09 DIAGNOSIS — Z9012 Acquired absence of left breast and nipple: Secondary | ICD-10-CM | POA: Diagnosis not present

## 2017-06-09 DIAGNOSIS — C7931 Secondary malignant neoplasm of brain: Secondary | ICD-10-CM | POA: Diagnosis not present

## 2017-06-09 DIAGNOSIS — Z853 Personal history of malignant neoplasm of breast: Secondary | ICD-10-CM | POA: Diagnosis not present

## 2017-06-10 DIAGNOSIS — I5020 Unspecified systolic (congestive) heart failure: Secondary | ICD-10-CM | POA: Diagnosis not present

## 2017-06-10 DIAGNOSIS — Z853 Personal history of malignant neoplasm of breast: Secondary | ICD-10-CM | POA: Diagnosis not present

## 2017-06-10 DIAGNOSIS — C7931 Secondary malignant neoplasm of brain: Secondary | ICD-10-CM | POA: Diagnosis not present

## 2017-06-10 DIAGNOSIS — G40909 Epilepsy, unspecified, not intractable, without status epilepticus: Secondary | ICD-10-CM | POA: Diagnosis not present

## 2017-06-10 DIAGNOSIS — I214 Non-ST elevation (NSTEMI) myocardial infarction: Secondary | ICD-10-CM | POA: Diagnosis not present

## 2017-06-10 DIAGNOSIS — Z9012 Acquired absence of left breast and nipple: Secondary | ICD-10-CM | POA: Diagnosis not present

## 2017-06-10 DIAGNOSIS — Z452 Encounter for adjustment and management of vascular access device: Secondary | ICD-10-CM | POA: Diagnosis not present

## 2017-06-11 DIAGNOSIS — Z9012 Acquired absence of left breast and nipple: Secondary | ICD-10-CM | POA: Diagnosis not present

## 2017-06-11 DIAGNOSIS — Z853 Personal history of malignant neoplasm of breast: Secondary | ICD-10-CM | POA: Diagnosis not present

## 2017-06-11 DIAGNOSIS — I214 Non-ST elevation (NSTEMI) myocardial infarction: Secondary | ICD-10-CM | POA: Diagnosis not present

## 2017-06-11 DIAGNOSIS — G40909 Epilepsy, unspecified, not intractable, without status epilepticus: Secondary | ICD-10-CM | POA: Diagnosis not present

## 2017-06-11 DIAGNOSIS — C7931 Secondary malignant neoplasm of brain: Secondary | ICD-10-CM | POA: Diagnosis not present

## 2017-06-11 DIAGNOSIS — I5020 Unspecified systolic (congestive) heart failure: Secondary | ICD-10-CM | POA: Diagnosis not present

## 2017-06-12 DIAGNOSIS — I5020 Unspecified systolic (congestive) heart failure: Secondary | ICD-10-CM | POA: Diagnosis not present

## 2017-06-12 DIAGNOSIS — I214 Non-ST elevation (NSTEMI) myocardial infarction: Secondary | ICD-10-CM | POA: Diagnosis not present

## 2017-06-12 DIAGNOSIS — G40909 Epilepsy, unspecified, not intractable, without status epilepticus: Secondary | ICD-10-CM | POA: Diagnosis not present

## 2017-06-12 DIAGNOSIS — Z9012 Acquired absence of left breast and nipple: Secondary | ICD-10-CM | POA: Diagnosis not present

## 2017-06-12 DIAGNOSIS — C7931 Secondary malignant neoplasm of brain: Secondary | ICD-10-CM | POA: Diagnosis not present

## 2017-06-12 DIAGNOSIS — Z853 Personal history of malignant neoplasm of breast: Secondary | ICD-10-CM | POA: Diagnosis not present

## 2017-06-13 DIAGNOSIS — C7931 Secondary malignant neoplasm of brain: Secondary | ICD-10-CM | POA: Diagnosis not present

## 2017-06-13 DIAGNOSIS — I5020 Unspecified systolic (congestive) heart failure: Secondary | ICD-10-CM | POA: Diagnosis not present

## 2017-06-13 DIAGNOSIS — G40909 Epilepsy, unspecified, not intractable, without status epilepticus: Secondary | ICD-10-CM | POA: Diagnosis not present

## 2017-06-13 DIAGNOSIS — I214 Non-ST elevation (NSTEMI) myocardial infarction: Secondary | ICD-10-CM | POA: Diagnosis not present

## 2017-06-13 DIAGNOSIS — Z853 Personal history of malignant neoplasm of breast: Secondary | ICD-10-CM | POA: Diagnosis not present

## 2017-06-13 DIAGNOSIS — Z9012 Acquired absence of left breast and nipple: Secondary | ICD-10-CM | POA: Diagnosis not present

## 2017-06-14 DIAGNOSIS — I214 Non-ST elevation (NSTEMI) myocardial infarction: Secondary | ICD-10-CM | POA: Diagnosis not present

## 2017-06-14 DIAGNOSIS — Z853 Personal history of malignant neoplasm of breast: Secondary | ICD-10-CM | POA: Diagnosis not present

## 2017-06-14 DIAGNOSIS — G40909 Epilepsy, unspecified, not intractable, without status epilepticus: Secondary | ICD-10-CM | POA: Diagnosis not present

## 2017-06-14 DIAGNOSIS — Z9012 Acquired absence of left breast and nipple: Secondary | ICD-10-CM | POA: Diagnosis not present

## 2017-06-14 DIAGNOSIS — C7931 Secondary malignant neoplasm of brain: Secondary | ICD-10-CM | POA: Diagnosis not present

## 2017-06-14 DIAGNOSIS — I5020 Unspecified systolic (congestive) heart failure: Secondary | ICD-10-CM | POA: Diagnosis not present

## 2017-06-15 DIAGNOSIS — G40909 Epilepsy, unspecified, not intractable, without status epilepticus: Secondary | ICD-10-CM | POA: Diagnosis not present

## 2017-06-15 DIAGNOSIS — C7931 Secondary malignant neoplasm of brain: Secondary | ICD-10-CM | POA: Diagnosis not present

## 2017-06-15 DIAGNOSIS — Z9012 Acquired absence of left breast and nipple: Secondary | ICD-10-CM | POA: Diagnosis not present

## 2017-06-15 DIAGNOSIS — I5020 Unspecified systolic (congestive) heart failure: Secondary | ICD-10-CM | POA: Diagnosis not present

## 2017-06-15 DIAGNOSIS — I214 Non-ST elevation (NSTEMI) myocardial infarction: Secondary | ICD-10-CM | POA: Diagnosis not present

## 2017-06-15 DIAGNOSIS — Z853 Personal history of malignant neoplasm of breast: Secondary | ICD-10-CM | POA: Diagnosis not present

## 2017-06-16 DIAGNOSIS — I214 Non-ST elevation (NSTEMI) myocardial infarction: Secondary | ICD-10-CM | POA: Diagnosis not present

## 2017-06-16 DIAGNOSIS — G40909 Epilepsy, unspecified, not intractable, without status epilepticus: Secondary | ICD-10-CM | POA: Diagnosis not present

## 2017-06-16 DIAGNOSIS — I5020 Unspecified systolic (congestive) heart failure: Secondary | ICD-10-CM | POA: Diagnosis not present

## 2017-06-16 DIAGNOSIS — C7931 Secondary malignant neoplasm of brain: Secondary | ICD-10-CM | POA: Diagnosis not present

## 2017-06-16 DIAGNOSIS — Z853 Personal history of malignant neoplasm of breast: Secondary | ICD-10-CM | POA: Diagnosis not present

## 2017-06-16 DIAGNOSIS — Z9012 Acquired absence of left breast and nipple: Secondary | ICD-10-CM | POA: Diagnosis not present

## 2017-06-17 DIAGNOSIS — I5020 Unspecified systolic (congestive) heart failure: Secondary | ICD-10-CM | POA: Diagnosis not present

## 2017-06-17 DIAGNOSIS — I214 Non-ST elevation (NSTEMI) myocardial infarction: Secondary | ICD-10-CM | POA: Diagnosis not present

## 2017-06-17 DIAGNOSIS — G40909 Epilepsy, unspecified, not intractable, without status epilepticus: Secondary | ICD-10-CM | POA: Diagnosis not present

## 2017-06-17 DIAGNOSIS — Z853 Personal history of malignant neoplasm of breast: Secondary | ICD-10-CM | POA: Diagnosis not present

## 2017-06-17 DIAGNOSIS — C7931 Secondary malignant neoplasm of brain: Secondary | ICD-10-CM | POA: Diagnosis not present

## 2017-06-17 DIAGNOSIS — Z9012 Acquired absence of left breast and nipple: Secondary | ICD-10-CM | POA: Diagnosis not present

## 2017-06-18 DIAGNOSIS — G40909 Epilepsy, unspecified, not intractable, without status epilepticus: Secondary | ICD-10-CM | POA: Diagnosis not present

## 2017-06-18 DIAGNOSIS — Z9012 Acquired absence of left breast and nipple: Secondary | ICD-10-CM | POA: Diagnosis not present

## 2017-06-18 DIAGNOSIS — I5020 Unspecified systolic (congestive) heart failure: Secondary | ICD-10-CM | POA: Diagnosis not present

## 2017-06-18 DIAGNOSIS — Z853 Personal history of malignant neoplasm of breast: Secondary | ICD-10-CM | POA: Diagnosis not present

## 2017-06-18 DIAGNOSIS — C7931 Secondary malignant neoplasm of brain: Secondary | ICD-10-CM | POA: Diagnosis not present

## 2017-06-18 DIAGNOSIS — I214 Non-ST elevation (NSTEMI) myocardial infarction: Secondary | ICD-10-CM | POA: Diagnosis not present

## 2017-06-19 DIAGNOSIS — C7931 Secondary malignant neoplasm of brain: Secondary | ICD-10-CM | POA: Diagnosis not present

## 2017-06-19 DIAGNOSIS — I214 Non-ST elevation (NSTEMI) myocardial infarction: Secondary | ICD-10-CM | POA: Diagnosis not present

## 2017-06-19 DIAGNOSIS — G40909 Epilepsy, unspecified, not intractable, without status epilepticus: Secondary | ICD-10-CM | POA: Diagnosis not present

## 2017-06-19 DIAGNOSIS — Z9012 Acquired absence of left breast and nipple: Secondary | ICD-10-CM | POA: Diagnosis not present

## 2017-06-19 DIAGNOSIS — I5020 Unspecified systolic (congestive) heart failure: Secondary | ICD-10-CM | POA: Diagnosis not present

## 2017-06-19 DIAGNOSIS — Z853 Personal history of malignant neoplasm of breast: Secondary | ICD-10-CM | POA: Diagnosis not present

## 2017-06-20 DIAGNOSIS — Z853 Personal history of malignant neoplasm of breast: Secondary | ICD-10-CM | POA: Diagnosis not present

## 2017-06-20 DIAGNOSIS — I5020 Unspecified systolic (congestive) heart failure: Secondary | ICD-10-CM | POA: Diagnosis not present

## 2017-06-20 DIAGNOSIS — I214 Non-ST elevation (NSTEMI) myocardial infarction: Secondary | ICD-10-CM | POA: Diagnosis not present

## 2017-06-20 DIAGNOSIS — C7931 Secondary malignant neoplasm of brain: Secondary | ICD-10-CM | POA: Diagnosis not present

## 2017-06-20 DIAGNOSIS — G40909 Epilepsy, unspecified, not intractable, without status epilepticus: Secondary | ICD-10-CM | POA: Diagnosis not present

## 2017-06-20 DIAGNOSIS — Z9012 Acquired absence of left breast and nipple: Secondary | ICD-10-CM | POA: Diagnosis not present

## 2017-06-21 ENCOUNTER — Telehealth: Payer: Self-pay | Admitting: Internal Medicine

## 2017-06-21 DIAGNOSIS — Z9012 Acquired absence of left breast and nipple: Secondary | ICD-10-CM | POA: Diagnosis not present

## 2017-06-21 DIAGNOSIS — G40909 Epilepsy, unspecified, not intractable, without status epilepticus: Secondary | ICD-10-CM | POA: Diagnosis not present

## 2017-06-21 DIAGNOSIS — I214 Non-ST elevation (NSTEMI) myocardial infarction: Secondary | ICD-10-CM | POA: Diagnosis not present

## 2017-06-21 DIAGNOSIS — I5020 Unspecified systolic (congestive) heart failure: Secondary | ICD-10-CM | POA: Diagnosis not present

## 2017-06-21 DIAGNOSIS — Z853 Personal history of malignant neoplasm of breast: Secondary | ICD-10-CM | POA: Diagnosis not present

## 2017-06-21 DIAGNOSIS — C7931 Secondary malignant neoplasm of brain: Secondary | ICD-10-CM | POA: Diagnosis not present

## 2017-06-22 NOTE — Telephone Encounter (Signed)
I'm sorry!!

## 2017-07-10 NOTE — Telephone Encounter (Signed)
notifying that patient died today 10-Jul-2017 at 12:02am.

## 2017-07-10 DEATH — deceased

## 2017-07-14 ENCOUNTER — Other Ambulatory Visit: Payer: Self-pay | Admitting: Nurse Practitioner

## 2018-09-09 IMAGING — MR MR HEAD WO/W CM
12 of 13 series · 29 of 48 positions shown · IV contrast (20 MH)
Comparison: MRI of the brain 07/10/16.

CLINICAL DATA: Resection of cystic neoplasm yesterday.

EXAM:
MRI HEAD WITHOUT AND WITH CONTRAST
TECHNIQUE: Multiplanar, multiecho pulse sequences of the brain and surrounding
structures were obtained without and with intravenous contrast.
CONTRAST:  20mL MULTIHANCE GADOBENATE DIMEGLUMINE 529 MG/ML IV SOLN

[Series 3: DWI · axial · 3.0mm · 0.94mm/px · z∈[-50,+81]mm · 5 of 89 slices shown (1 of 2)]
[im 1/89]
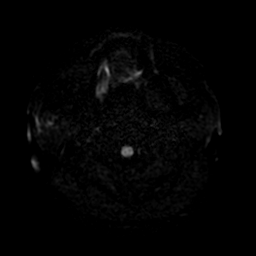
[im 23/89]
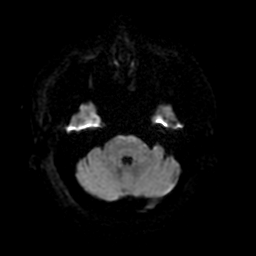
[im 45/89]
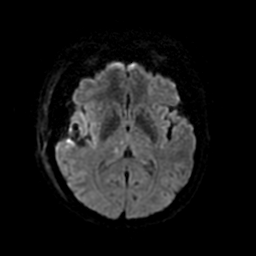
[im 67/89]
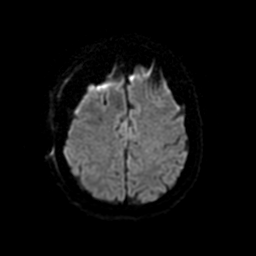
[im 89/89]
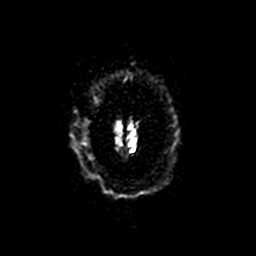

[Series 4: FLAIR · sagittal · 5.0mm · 0.47mm/px · 1 of 23 slices shown (1 of 3)]
[im 1/23]
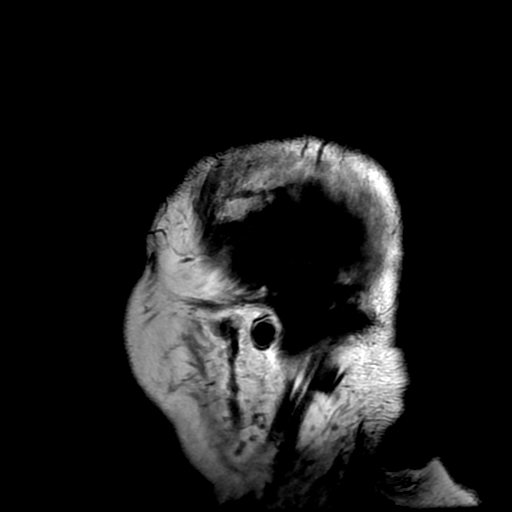

[Series 5: T2 · axial · 5.0mm · 0.47mm/px · 1 of 23 slices shown]
[im 1/23]
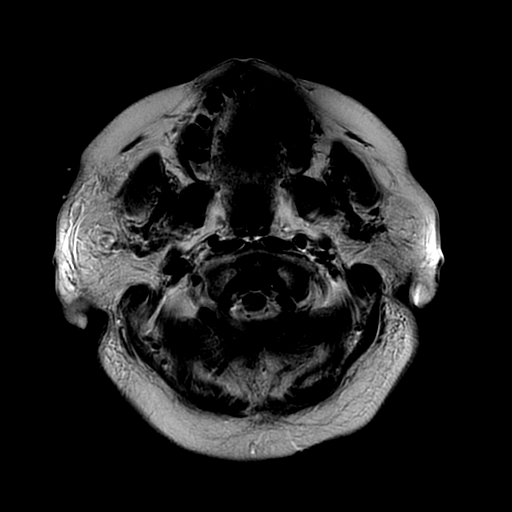

[Series 6: FLAIR · axial · 5.0mm · 0.47mm/px · 1 of 23 slices shown (2 of 3)]
[im 1/23]
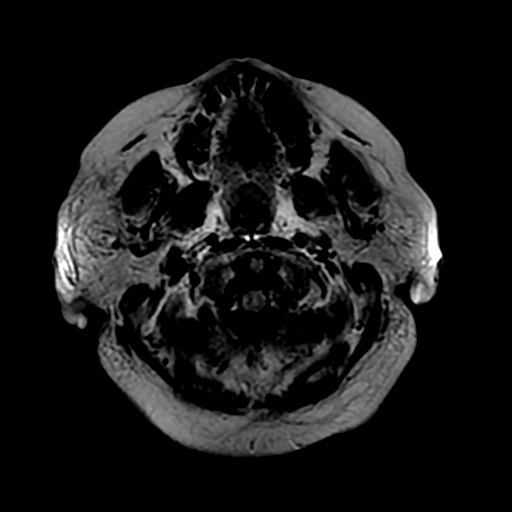

[Series 7: DWI · coronal · 4.0mm · 0.94mm/px · 4 of 60 slices shown (2 of 2)]
[im 1/60]
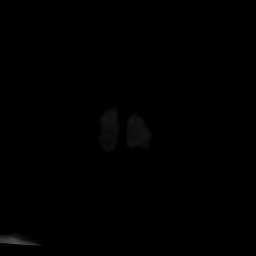
[im 20/60]
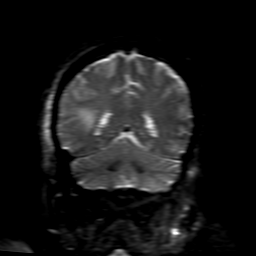
[im 40/60]
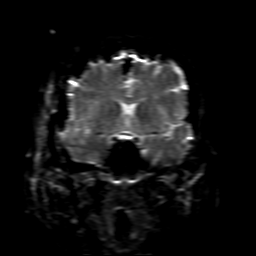
[im 60/60]
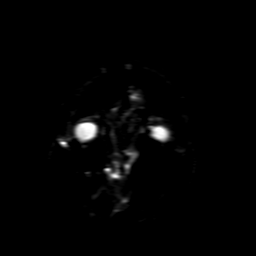

[Series 8: (person_name) · axial · 3.0mm · 0.47mm/px · z∈[-51,+84]mm · 6 of 92 slices shown]
[im 1/92]
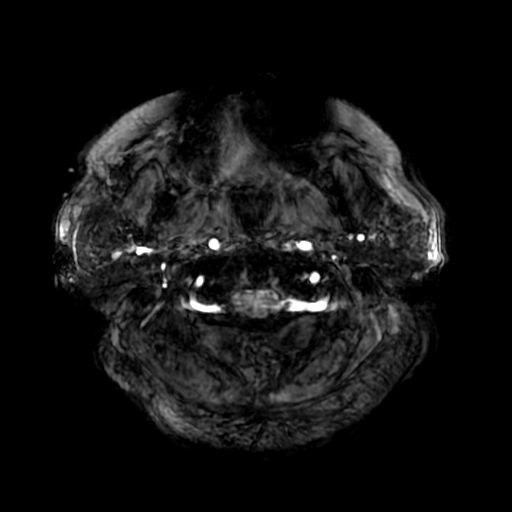
[im 19/92]
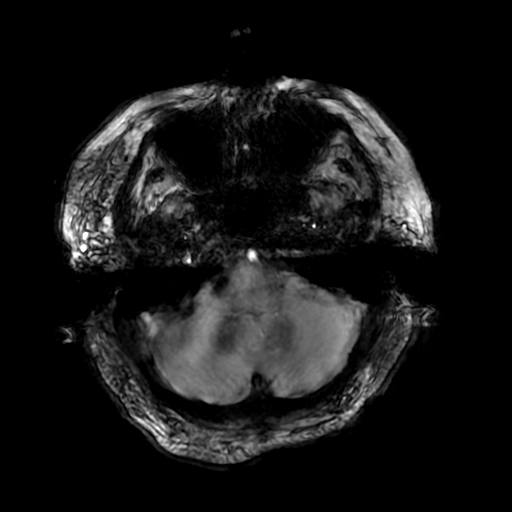
[im 37/92]
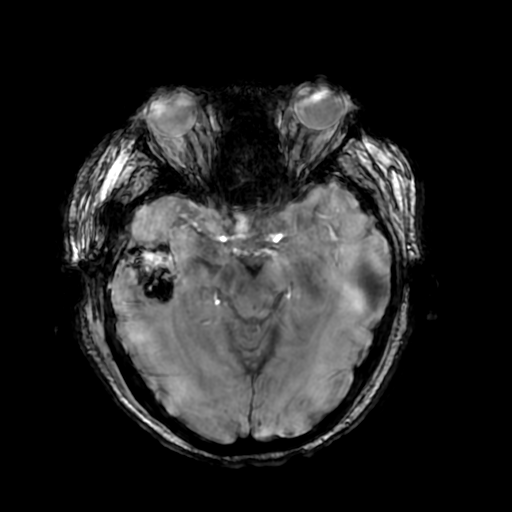
[im 55/92]
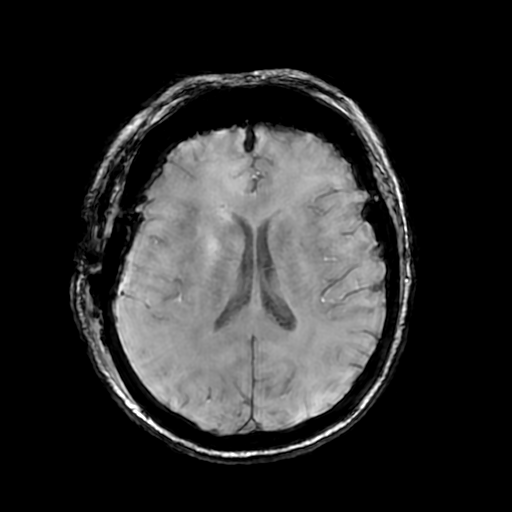
[im 73/92]
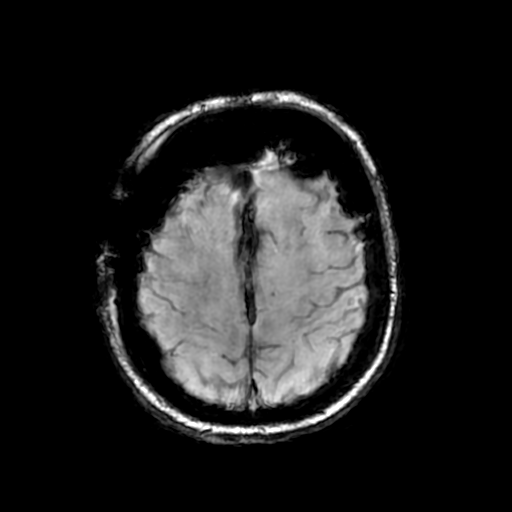
[im 92/92]
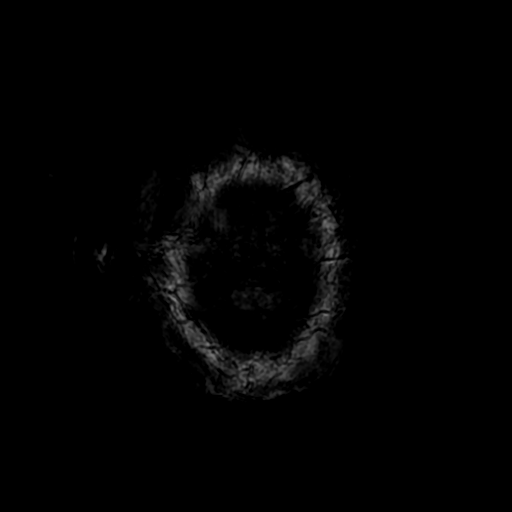

[Series 9: ax 3(person_name) · axial · 1.0mm · 0.94mm/px · 1 of 154 slices shown]
[im 1/154]
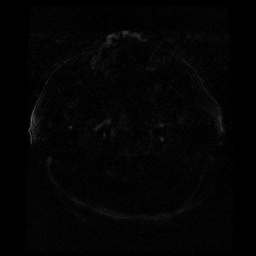

[Series 10: T2 post-contrast · coronal · 5.0mm · 0.39mm/px · 2 of 25 slices shown]
[im 1/25]
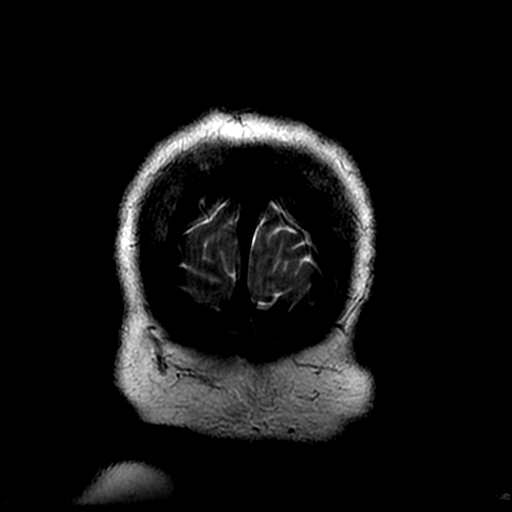
[im 25/25]
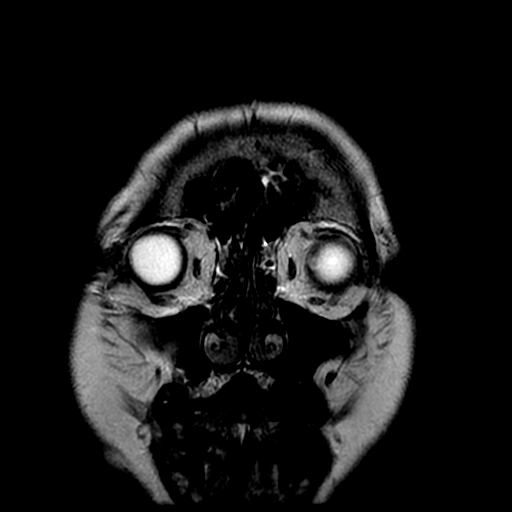

[Series 12: T1 · coronal · 5.0mm · 0.39mm/px · 2 of 25 slices shown]
[im 1/25]
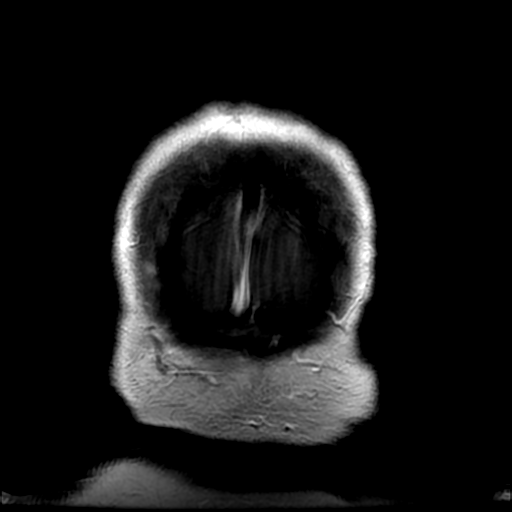
[im 25/25]
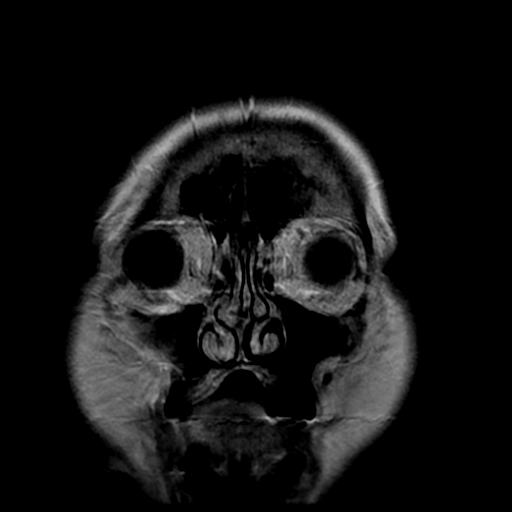

[Series 13: FLAIR · sagittal · 5.0mm · 0.47mm/px · 1 of 23 slices shown (3 of 3)]
[im 1/23]
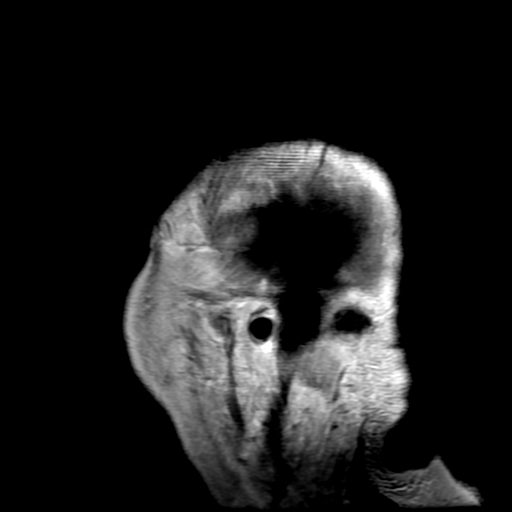

[Series 350: ADC · axial · 3.0mm · 0.94mm/px · z∈[-50,+81]mm · 3 of 41 slices shown (1 of 2)]
[im 1/41]
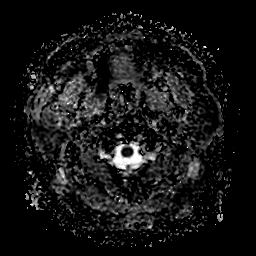
[im 21/41]
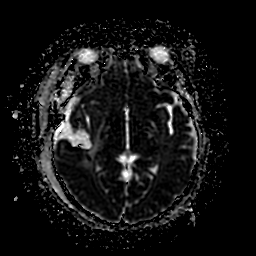
[im 41/41]
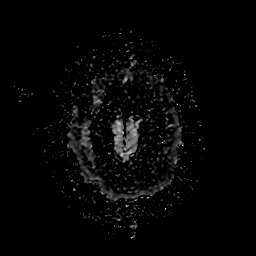

[Series 750: ADC · coronal · 4.0mm · 0.94mm/px · 2 of 30 slices shown (2 of 2)]
[im 1/30]
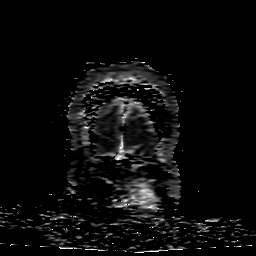
[im 30/30]
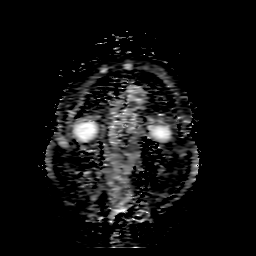

[29 of 48 positions shown; findings below may reference images not displayed]

FINDINGS: Brain: A right temporal craniotomy is noted. The peripherally
enhancing cystic lesion has been resected. Blood products are
evident within the surgical cavity. There is residual enhancement
along the anterior and superior margin of the surgical cavity. The
longest contiguous area of enhancement is 3.4 cm. There is also some
restricted diffusion along the posterior margin of the surgical
cavity.

Posterior T2 changes are again noted.

A lesion in the high left parietal lobe measures 13 x 10 x 10 mm. No
other focal enhancing lesions are present.

White matter changes are otherwise stable.

Vascular: Flow is present in the major intracranial arteries.

Skull and upper cervical spine: The skullbase is within normal
limits. Calvarium is otherwise intact. Marrow signal is normal. The
craniocervical junction is within normal limits.

Sinuses/Orbits: Mild mucosal thickening is present within the
anterior ethmoid air cells in the inferior right frontal sinus.
Mastoid air cells are clear. The globes and orbits are intact
IMPRESSION: 1. Right temporal craniotomy for resection of temporal lobe tumor,
likely metastasis.
2. Residual enhancement along the anterior and superior margin of
the resection cavity likely reflects some residual tumor.
3. Restricted diffusion along the posterior margin of the surgical
cavity likely reflects infarct. There is no associated enhancement.
4. 1.3 cm left parietal lobe metastasis is stable.
5. Diffuse white matter disease is stable.
These results were called by telephone at the time of interpretation
on 07/14/2016 at [DATE] to Dr. ENKELAND PRETTO , who verbally
acknowledged these results.
